# Patient Record
Sex: Male | Born: 1960 | Race: Black or African American | Hispanic: No | Marital: Single | Smoking: Current every day smoker
Health system: Southern US, Community
[De-identification: ages and names within clinical notes are randomized; demographics above are authoritative.]

## PROBLEM LIST (undated history)

## (undated) DIAGNOSIS — I10 Essential (primary) hypertension: Secondary | ICD-10-CM

## (undated) DIAGNOSIS — E119 Type 2 diabetes mellitus without complications: Secondary | ICD-10-CM

## (undated) DIAGNOSIS — Z9119 Patient's noncompliance with other medical treatment and regimen: Secondary | ICD-10-CM

## (undated) DIAGNOSIS — E785 Hyperlipidemia, unspecified: Secondary | ICD-10-CM

## (undated) DIAGNOSIS — J449 Chronic obstructive pulmonary disease, unspecified: Secondary | ICD-10-CM

## (undated) DIAGNOSIS — Z91199 Patient's noncompliance with other medical treatment and regimen due to unspecified reason: Secondary | ICD-10-CM

## (undated) DIAGNOSIS — G4733 Obstructive sleep apnea (adult) (pediatric): Secondary | ICD-10-CM

## (undated) DIAGNOSIS — I428 Other cardiomyopathies: Secondary | ICD-10-CM

## (undated) DIAGNOSIS — I5042 Chronic combined systolic (congestive) and diastolic (congestive) heart failure: Secondary | ICD-10-CM

## (undated) DIAGNOSIS — F1910 Other psychoactive substance abuse, uncomplicated: Secondary | ICD-10-CM

## (undated) HISTORY — PX: OTHER SURGICAL HISTORY: SHX169

## (undated) HISTORY — DX: Obstructive sleep apnea (adult) (pediatric): G47.33

## (undated) HISTORY — DX: Chronic combined systolic (congestive) and diastolic (congestive) heart failure: I50.42

---

## 1998-05-29 ENCOUNTER — Emergency Department (HOSPITAL_COMMUNITY): Admission: EM | Admit: 1998-05-29 | Discharge: 1998-05-29 | Payer: Self-pay

## 1998-10-09 ENCOUNTER — Encounter: Payer: Self-pay | Admitting: Family Medicine

## 1998-10-09 ENCOUNTER — Ambulatory Visit (HOSPITAL_COMMUNITY): Admission: RE | Admit: 1998-10-09 | Discharge: 1998-10-09 | Payer: Self-pay | Admitting: Family Medicine

## 1998-10-17 ENCOUNTER — Encounter: Payer: Self-pay | Admitting: Family Medicine

## 1998-10-17 ENCOUNTER — Ambulatory Visit (HOSPITAL_COMMUNITY): Admission: RE | Admit: 1998-10-17 | Discharge: 1998-10-17 | Payer: Self-pay | Admitting: Family Medicine

## 1999-12-24 ENCOUNTER — Ambulatory Visit (HOSPITAL_COMMUNITY): Admission: RE | Admit: 1999-12-24 | Discharge: 1999-12-24 | Payer: Self-pay | Admitting: Internal Medicine

## 2000-09-12 ENCOUNTER — Ambulatory Visit (HOSPITAL_BASED_OUTPATIENT_CLINIC_OR_DEPARTMENT_OTHER): Admission: RE | Admit: 2000-09-12 | Discharge: 2000-09-12 | Payer: Self-pay | Admitting: Internal Medicine

## 2000-09-30 ENCOUNTER — Ambulatory Visit (HOSPITAL_BASED_OUTPATIENT_CLINIC_OR_DEPARTMENT_OTHER): Admission: RE | Admit: 2000-09-30 | Discharge: 2000-09-30 | Payer: Self-pay | Admitting: Internal Medicine

## 2000-10-30 ENCOUNTER — Ambulatory Visit (HOSPITAL_BASED_OUTPATIENT_CLINIC_OR_DEPARTMENT_OTHER): Admission: RE | Admit: 2000-10-30 | Discharge: 2000-10-30 | Payer: Self-pay | Admitting: Internal Medicine

## 2004-04-15 ENCOUNTER — Emergency Department (HOSPITAL_COMMUNITY): Admission: EM | Admit: 2004-04-15 | Discharge: 2004-04-15 | Payer: Self-pay | Admitting: Emergency Medicine

## 2004-04-17 ENCOUNTER — Ambulatory Visit: Payer: Self-pay | Admitting: Family Medicine

## 2004-04-17 ENCOUNTER — Ambulatory Visit: Payer: Self-pay | Admitting: *Deleted

## 2004-05-21 ENCOUNTER — Ambulatory Visit: Payer: Self-pay | Admitting: Family Medicine

## 2004-08-14 ENCOUNTER — Ambulatory Visit: Payer: Self-pay | Admitting: Family Medicine

## 2005-01-02 ENCOUNTER — Ambulatory Visit: Payer: Self-pay | Admitting: Family Medicine

## 2005-01-15 ENCOUNTER — Ambulatory Visit: Payer: Self-pay | Admitting: Family Medicine

## 2005-01-29 ENCOUNTER — Ambulatory Visit: Payer: Self-pay | Admitting: Family Medicine

## 2005-03-06 ENCOUNTER — Ambulatory Visit: Payer: Self-pay | Admitting: Family Medicine

## 2005-06-25 ENCOUNTER — Emergency Department (HOSPITAL_COMMUNITY): Admission: EM | Admit: 2005-06-25 | Discharge: 2005-06-25 | Payer: Self-pay | Admitting: Emergency Medicine

## 2005-07-29 ENCOUNTER — Ambulatory Visit: Payer: Self-pay | Admitting: Internal Medicine

## 2005-08-07 ENCOUNTER — Ambulatory Visit: Payer: Self-pay | Admitting: Family Medicine

## 2005-10-29 ENCOUNTER — Ambulatory Visit: Payer: Self-pay | Admitting: Family Medicine

## 2006-03-13 ENCOUNTER — Ambulatory Visit: Payer: Self-pay | Admitting: Family Medicine

## 2006-05-23 ENCOUNTER — Ambulatory Visit: Payer: Self-pay | Admitting: Nurse Practitioner

## 2006-05-23 ENCOUNTER — Ambulatory Visit (HOSPITAL_COMMUNITY): Admission: RE | Admit: 2006-05-23 | Discharge: 2006-05-23 | Payer: Self-pay | Admitting: Nurse Practitioner

## 2006-06-04 ENCOUNTER — Ambulatory Visit: Payer: Self-pay | Admitting: Family Medicine

## 2006-07-10 ENCOUNTER — Ambulatory Visit: Payer: Self-pay | Admitting: Family Medicine

## 2006-07-16 ENCOUNTER — Ambulatory Visit: Payer: Self-pay | Admitting: Cardiology

## 2006-07-16 ENCOUNTER — Encounter: Payer: Self-pay | Admitting: Cardiology

## 2006-07-16 ENCOUNTER — Ambulatory Visit (HOSPITAL_COMMUNITY): Admission: RE | Admit: 2006-07-16 | Discharge: 2006-07-16 | Payer: Self-pay | Admitting: Internal Medicine

## 2006-07-23 ENCOUNTER — Ambulatory Visit: Payer: Self-pay | Admitting: Family Medicine

## 2006-09-05 ENCOUNTER — Encounter (HOSPITAL_COMMUNITY): Admission: RE | Admit: 2006-09-05 | Discharge: 2006-11-25 | Payer: Self-pay | Admitting: Interventional Cardiology

## 2007-04-29 ENCOUNTER — Encounter (INDEPENDENT_AMBULATORY_CARE_PROVIDER_SITE_OTHER): Payer: Self-pay | Admitting: *Deleted

## 2008-06-29 ENCOUNTER — Emergency Department (HOSPITAL_COMMUNITY): Admission: EM | Admit: 2008-06-29 | Discharge: 2008-06-29 | Payer: Self-pay | Admitting: Emergency Medicine

## 2010-05-21 ENCOUNTER — Ambulatory Visit: Payer: Self-pay | Admitting: Cardiology

## 2010-05-21 ENCOUNTER — Encounter (INDEPENDENT_AMBULATORY_CARE_PROVIDER_SITE_OTHER): Payer: Self-pay | Admitting: Internal Medicine

## 2010-05-21 ENCOUNTER — Inpatient Hospital Stay (HOSPITAL_COMMUNITY): Admission: EM | Admit: 2010-05-21 | Discharge: 2010-05-24 | Payer: Self-pay | Admitting: Emergency Medicine

## 2010-06-11 ENCOUNTER — Encounter
Admission: RE | Admit: 2010-06-11 | Discharge: 2010-06-11 | Payer: Self-pay | Source: Home / Self Care | Attending: Internal Medicine | Admitting: Internal Medicine

## 2010-10-25 LAB — POCT I-STAT, CHEM 8
HCT: 55 % — ABNORMAL HIGH (ref 39.0–52.0)
Hemoglobin: 18.7 g/dL — ABNORMAL HIGH (ref 13.0–17.0)
Potassium: 3.7 mEq/L (ref 3.5–5.1)
Sodium: 145 mEq/L (ref 135–145)

## 2010-10-25 LAB — CBC
HCT: 50.5 % (ref 39.0–52.0)
Hemoglobin: 17.2 g/dL — ABNORMAL HIGH (ref 13.0–17.0)
MCH: 30.2 pg (ref 26.0–34.0)
MCHC: 33.9 g/dL (ref 30.0–36.0)
MCHC: 34.1 g/dL (ref 30.0–36.0)
Platelets: 186 10*3/uL (ref 150–400)
RBC: 5.07 MIL/uL (ref 4.22–5.81)
RDW: 13.3 % (ref 11.5–15.5)

## 2010-10-25 LAB — GLUCOSE, CAPILLARY
Glucose-Capillary: 146 mg/dL — ABNORMAL HIGH (ref 70–99)
Glucose-Capillary: 152 mg/dL — ABNORMAL HIGH (ref 70–99)
Glucose-Capillary: 164 mg/dL — ABNORMAL HIGH (ref 70–99)
Glucose-Capillary: 190 mg/dL — ABNORMAL HIGH (ref 70–99)
Glucose-Capillary: 213 mg/dL — ABNORMAL HIGH (ref 70–99)

## 2010-10-25 LAB — BRAIN NATRIURETIC PEPTIDE: Pro B Natriuretic peptide (BNP): 552 pg/mL — ABNORMAL HIGH (ref 0.0–100.0)

## 2010-10-25 LAB — BASIC METABOLIC PANEL
BUN: 13 mg/dL (ref 6–23)
BUN: 13 mg/dL (ref 6–23)
CO2: 30 mEq/L (ref 19–32)
Calcium: 8.7 mg/dL (ref 8.4–10.5)
Calcium: 8.8 mg/dL (ref 8.4–10.5)
Calcium: 8.8 mg/dL (ref 8.4–10.5)
Creatinine, Ser: 0.9 mg/dL (ref 0.4–1.5)
Creatinine, Ser: 0.91 mg/dL (ref 0.4–1.5)
Creatinine, Ser: 0.99 mg/dL (ref 0.4–1.5)
GFR calc Af Amer: >60 mL/min (ref >60)
GFR calc Af Amer: >60 mL/min (ref >60)
GFR calc non Af Amer: >60 mL/min (ref >60)

## 2010-10-25 LAB — LIPID PANEL
Cholesterol: 173 mg/dL (ref 0–200)
HDL: 42 mg/dL (ref >39)
Total CHOL/HDL Ratio: 4.1 RATIO

## 2010-10-25 LAB — DIFFERENTIAL
Basophils Absolute: 0 10*3/uL (ref 0.0–0.1)
Basophils Relative: 0 % (ref 0–1)
Eosinophils Absolute: 0.2 10*3/uL (ref 0.0–0.7)
Neutrophils Relative %: 55 % (ref 43–77)

## 2010-10-25 LAB — COMPREHENSIVE METABOLIC PANEL
ALT: 41 U/L (ref 0–53)
Alkaline Phosphatase: 94 U/L (ref 39–117)
CO2: 27 mEq/L (ref 19–32)
Calcium: 9 mg/dL (ref 8.4–10.5)
GFR calc non Af Amer: >60 mL/min (ref >60)
Glucose, Bld: 201 mg/dL — ABNORMAL HIGH (ref 70–99)
Potassium: 3.8 mEq/L (ref 3.5–5.1)
Sodium: 144 mEq/L (ref 135–145)

## 2010-10-25 LAB — CARDIAC PANEL(CRET KIN+CKTOT+MB+TROPI)
CK, MB: 2.7 ng/mL (ref 0.3–4.0)
CK, MB: 3 ng/mL (ref 0.3–4.0)
Relative Index: 2.4 (ref 0.0–2.5)
Total CK: 111 U/L (ref 7–232)

## 2010-10-25 LAB — URINALYSIS, ROUTINE W REFLEX MICROSCOPIC
Bilirubin Urine: NEGATIVE
Ketones, ur: NEGATIVE mg/dL
Nitrite: NEGATIVE
Protein, ur: >300 mg/dL — AB
pH: 5.5 (ref 5.0–8.0)

## 2010-10-25 LAB — POCT CARDIAC MARKERS
CKMB, poc: 1.6 ng/mL (ref 1.0–8.0)
Myoglobin, poc: 45.6 ng/mL (ref 12–200)

## 2010-10-25 LAB — CK TOTAL AND CKMB (NOT AT ARMC): Relative Index: 2.2 (ref 0.0–2.5)

## 2010-10-25 LAB — TROPONIN I: Troponin I: 0.02 ng/mL (ref 0.00–0.06)

## 2010-10-25 LAB — MAGNESIUM: Magnesium: 2.3 mg/dL (ref 1.5–2.5)

## 2011-05-14 LAB — POCT I-STAT, CHEM 8
Calcium, Ion: 1.17
Creatinine, Ser: 0.9
Glucose, Bld: 226 — ABNORMAL HIGH
HCT: 46
Hemoglobin: 15.6
Potassium: 4.3
TCO2: 26

## 2011-05-14 LAB — CBC
Hemoglobin: 14.8
MCHC: 33.1
MCV: 90.9
RDW: 13.6

## 2011-05-14 LAB — URINALYSIS, ROUTINE W REFLEX MICROSCOPIC
Bilirubin Urine: NEGATIVE
Glucose, UA: >1000 — AB
Leukocytes, UA: NEGATIVE
Nitrite: NEGATIVE
Protein, ur: 30 — AB

## 2011-05-14 LAB — DIFFERENTIAL
Basophils Absolute: 0
Basophils Relative: 0
Eosinophils Absolute: 0
Monocytes Absolute: 0.3
Neutro Abs: 3.7

## 2011-11-07 ENCOUNTER — Emergency Department (HOSPITAL_COMMUNITY): Payer: Self-pay

## 2011-11-07 ENCOUNTER — Other Ambulatory Visit: Payer: Self-pay

## 2011-11-07 ENCOUNTER — Inpatient Hospital Stay (HOSPITAL_COMMUNITY)
Admission: EM | Admit: 2011-11-07 | Discharge: 2011-11-09 | DRG: 189 | Disposition: A | Discharge status: Home / Self Care | Payer: 59 | Attending: Internal Medicine | Admitting: Internal Medicine

## 2011-11-07 ENCOUNTER — Encounter (HOSPITAL_COMMUNITY): Payer: Self-pay

## 2011-11-07 DIAGNOSIS — I428 Other cardiomyopathies: Secondary | ICD-10-CM | POA: Diagnosis present

## 2011-11-07 DIAGNOSIS — J9600 Acute respiratory failure, unspecified whether with hypoxia or hypercapnia: Principal | ICD-10-CM | POA: Diagnosis present

## 2011-11-07 DIAGNOSIS — R51 Headache: Secondary | ICD-10-CM | POA: Diagnosis present

## 2011-11-07 DIAGNOSIS — E785 Hyperlipidemia, unspecified: Secondary | ICD-10-CM | POA: Diagnosis present

## 2011-11-07 DIAGNOSIS — Z9119 Patient's noncompliance with other medical treatment and regimen: Secondary | ICD-10-CM

## 2011-11-07 DIAGNOSIS — J4489 Other specified chronic obstructive pulmonary disease: Secondary | ICD-10-CM | POA: Diagnosis present

## 2011-11-07 DIAGNOSIS — J810 Acute pulmonary edema: Secondary | ICD-10-CM

## 2011-11-07 DIAGNOSIS — I10 Essential (primary) hypertension: Secondary | ICD-10-CM | POA: Diagnosis present

## 2011-11-07 DIAGNOSIS — I509 Heart failure, unspecified: Secondary | ICD-10-CM | POA: Diagnosis present

## 2011-11-07 DIAGNOSIS — Z91199 Patient's noncompliance with other medical treatment and regimen due to unspecified reason: Secondary | ICD-10-CM

## 2011-11-07 DIAGNOSIS — J449 Chronic obstructive pulmonary disease, unspecified: Secondary | ICD-10-CM | POA: Diagnosis present

## 2011-11-07 DIAGNOSIS — J811 Chronic pulmonary edema: Secondary | ICD-10-CM

## 2011-11-07 DIAGNOSIS — F17200 Nicotine dependence, unspecified, uncomplicated: Secondary | ICD-10-CM | POA: Diagnosis present

## 2011-11-07 DIAGNOSIS — E119 Type 2 diabetes mellitus without complications: Secondary | ICD-10-CM | POA: Diagnosis present

## 2011-11-07 DIAGNOSIS — I5043 Acute on chronic combined systolic (congestive) and diastolic (congestive) heart failure: Secondary | ICD-10-CM | POA: Diagnosis present

## 2011-11-07 DIAGNOSIS — I169 Hypertensive crisis, unspecified: Secondary | ICD-10-CM | POA: Diagnosis present

## 2011-11-07 DIAGNOSIS — E876 Hypokalemia: Secondary | ICD-10-CM | POA: Diagnosis present

## 2011-11-07 DIAGNOSIS — G4733 Obstructive sleep apnea (adult) (pediatric): Secondary | ICD-10-CM | POA: Diagnosis present

## 2011-11-07 HISTORY — DX: Other cardiomyopathies: I42.8

## 2011-11-07 HISTORY — DX: Other psychoactive substance abuse, uncomplicated: F19.10

## 2011-11-07 HISTORY — DX: Essential (primary) hypertension: I10

## 2011-11-07 HISTORY — DX: Chronic obstructive pulmonary disease, unspecified: J44.9

## 2011-11-07 HISTORY — DX: Patient's noncompliance with other medical treatment and regimen due to unspecified reason: Z91.199

## 2011-11-07 HISTORY — DX: Patient's noncompliance with other medical treatment and regimen: Z91.19

## 2011-11-07 HISTORY — DX: Hyperlipidemia, unspecified: E78.5

## 2011-11-07 LAB — BASIC METABOLIC PANEL
CO2: 30 mEq/L (ref 19–32)
Calcium: 9.2 mg/dL (ref 8.4–10.5)
GFR calc Af Amer: >90 mL/min (ref >90)
GFR calc non Af Amer: >90 mL/min (ref >90)
Sodium: 139 mEq/L (ref 135–145)

## 2011-11-07 LAB — POCT I-STAT TROPONIN I: Troponin i, poc: 0.04 ng/mL (ref 0.00–0.08)

## 2011-11-07 LAB — GLUCOSE, CAPILLARY
Glucose-Capillary: 164 mg/dL — ABNORMAL HIGH (ref 70–99)
Glucose-Capillary: 84 mg/dL (ref 70–99)

## 2011-11-07 LAB — URINALYSIS, ROUTINE W REFLEX MICROSCOPIC
Glucose, UA: >1000 mg/dL — AB
Ketones, ur: NEGATIVE mg/dL
Ketones, ur: NEGATIVE mg/dL
Leukocytes, UA: NEGATIVE
Leukocytes, UA: NEGATIVE
Nitrite: NEGATIVE
Nitrite: NEGATIVE
Protein, ur: >300 mg/dL — AB
Protein, ur: NEGATIVE mg/dL
Urobilinogen, UA: 1 mg/dL (ref 0.0–1.0)
Urobilinogen, UA: 0.2 mg/dL (ref 0.0–1.0)

## 2011-11-07 LAB — RAPID URINE DRUG SCREEN, HOSP PERFORMED
Barbiturates: NOT DETECTED
Cocaine: NOT DETECTED

## 2011-11-07 LAB — PHOSPHORUS: Phosphorus: 5.1 mg/dL — ABNORMAL HIGH (ref 2.3–4.6)

## 2011-11-07 LAB — BLOOD GAS, ARTERIAL
Acid-base deficit: 1.9 mmol/L (ref 0.0–2.0)
Bicarbonate: 22.4 mEq/L (ref 20.0–24.0)
Drawn by: 295031
Inspiratory PAP: 14
Mode: POSITIVE
TCO2: 21.1 mmol/L (ref 0–100)
TCO2: 19.5 mmol/L (ref 0–100)
pCO2 arterial: 59.9 mmHg (ref 35.0–45.0)
pCO2 arterial: 39.1 mmHg (ref 35.0–45.0)
pH, Arterial: 7.215 — ABNORMAL LOW (ref 7.350–7.450)
pO2, Arterial: 324 mmHg — ABNORMAL HIGH (ref 80.0–100.0)
pO2, Arterial: 69.5 mmHg — ABNORMAL LOW (ref 80.0–100.0)

## 2011-11-07 LAB — APTT: aPTT: 27 seconds (ref 24–37)

## 2011-11-07 LAB — DIFFERENTIAL
Basophils Absolute: 0 10*3/uL (ref 0.0–0.1)
Eosinophils Absolute: 0.1 10*3/uL (ref 0.0–0.7)
Lymphocytes Relative: 31 % (ref 12–46)
Monocytes Absolute: 0.6 10*3/uL (ref 0.1–1.0)
Neutrophils Relative %: 61 % (ref 43–77)

## 2011-11-07 LAB — PROTIME-INR
INR: 1 (ref 0.00–1.49)
Prothrombin Time: 13.4 seconds (ref 11.6–15.2)

## 2011-11-07 LAB — PROCALCITONIN: Procalcitonin: 1.56 ng/mL

## 2011-11-07 LAB — POCT I-STAT, CHEM 8
Chloride: 105 mEq/L (ref 96–112)
HCT: 50 % (ref 39.0–52.0)
Hemoglobin: 17 g/dL (ref 13.0–17.0)
Potassium: 4.2 mEq/L (ref 3.5–5.1)
Sodium: 138 mEq/L (ref 135–145)

## 2011-11-07 LAB — CBC
MCH: 29.8 pg (ref 26.0–34.0)
MCHC: 33.3 g/dL (ref 30.0–36.0)
Platelets: 250 10*3/uL (ref 150–400)
RDW: 13.3 % (ref 11.5–15.5)

## 2011-11-07 LAB — URINE MICROSCOPIC-ADD ON

## 2011-11-07 LAB — MAGNESIUM: Magnesium: 1.7 mg/dL (ref 1.5–2.5)

## 2011-11-07 LAB — CORTISOL: Cortisol, Plasma: 53.2 ug/dL

## 2011-11-07 LAB — CARDIAC PANEL(CRET KIN+CKTOT+MB+TROPI)
CK, MB: 4.9 ng/mL — ABNORMAL HIGH (ref 0.3–4.0)
Troponin I: 1.22 ng/mL (ref <0.30)

## 2011-11-07 LAB — PRO B NATRIURETIC PEPTIDE: Pro B Natriuretic peptide (BNP): 1180 pg/mL — ABNORMAL HIGH (ref 0–125)

## 2011-11-07 MED ORDER — HYDRALAZINE HCL 20 MG/ML IJ SOLN
10.0000 mg | Freq: Once | INTRAMUSCULAR | Status: AC
  Administered 2011-11-07: 10 mg via INTRAVENOUS

## 2011-11-07 MED ORDER — POTASSIUM CHLORIDE CRYS ER 20 MEQ PO TBCR
40.0000 meq | EXTENDED_RELEASE_TABLET | Freq: Once | ORAL | Status: AC
  Administered 2011-11-07: 40 meq via ORAL

## 2011-11-07 MED ORDER — ETOMIDATE 2 MG/ML IV SOLN
INTRAVENOUS | Status: AC
  Filled 2011-11-07: qty 20

## 2011-11-07 MED ORDER — INSULIN ASPART 100 UNIT/ML ~~LOC~~ SOLN
0.0000 [IU] | Freq: Three times a day (TID) | SUBCUTANEOUS | Status: DC
  Administered 2011-11-07: 20 [IU] via SUBCUTANEOUS

## 2011-11-07 MED ORDER — IPRATROPIUM BROMIDE 0.02 % IN SOLN: 0.5000 mg | Freq: Four times a day (QID) | RESPIRATORY_TRACT | Status: DC | PRN

## 2011-11-07 MED ORDER — SODIUM CHLORIDE 0.9 % IV SOLN: INTRAVENOUS | Status: DC

## 2011-11-07 MED ORDER — PANTOPRAZOLE SODIUM 40 MG IV SOLR
40.0000 mg | Freq: Every day | INTRAVENOUS | Status: DC
  Administered 2011-11-07: 40 mg via INTRAVENOUS
  Filled 2011-11-07 (×2): qty 40

## 2011-11-07 MED ORDER — FUROSEMIDE 10 MG/ML IJ SOLN: 40.0000 mg | Freq: Once | INTRAMUSCULAR | Status: DC

## 2011-11-07 MED ORDER — SUCCINYLCHOLINE CHLORIDE 20 MG/ML IJ SOLN
INTRAMUSCULAR | Status: AC
  Filled 2011-11-07: qty 5

## 2011-11-07 MED ORDER — ALBUTEROL SULFATE (5 MG/ML) 0.5% IN NEBU
5.0000 mg | INHALATION_SOLUTION | Freq: Once | RESPIRATORY_TRACT | Status: AC
  Administered 2011-11-07: 5 mg via RESPIRATORY_TRACT
  Filled 2011-11-07: qty 1

## 2011-11-07 MED ORDER — HYDRALAZINE HCL 20 MG/ML IJ SOLN
20.0000 mg | INTRAMUSCULAR | Status: DC | PRN
  Filled 2011-11-07: qty 1

## 2011-11-07 MED ORDER — NITROGLYCERIN IN D5W 200-5 MCG/ML-% IV SOLN: 5.0000 ug/min | Freq: Once | INTRAVENOUS | Status: DC

## 2011-11-07 MED ORDER — LIDOCAINE HCL (CARDIAC) 20 MG/ML IV SOLN
INTRAVENOUS | Status: AC
  Filled 2011-11-07: qty 5

## 2011-11-07 MED ORDER — ASPIRIN 325 MG PO TABS
325.0000 mg | ORAL_TABLET | Freq: Once | ORAL | Status: DC
  Filled 2011-11-07: qty 1

## 2011-11-07 MED ORDER — ASPIRIN 81 MG PO CHEW
CHEWABLE_TABLET | ORAL | Status: AC
  Administered 2011-11-07: 324 mg
  Filled 2011-11-07: qty 4

## 2011-11-07 MED ORDER — FUROSEMIDE 10 MG/ML IJ SOLN
40.0000 mg | Freq: Once | INTRAMUSCULAR | Status: AC
  Administered 2011-11-07: 40 mg via INTRAVENOUS

## 2011-11-07 MED ORDER — ALBUTEROL SULFATE (5 MG/ML) 0.5% IN NEBU: 2.5000 mg | INHALATION_SOLUTION | Freq: Four times a day (QID) | RESPIRATORY_TRACT | Status: DC | PRN

## 2011-11-07 MED ORDER — NITROGLYCERIN IN D5W 200-5 MCG/ML-% IV SOLN
INTRAVENOUS | Status: AC
  Filled 2011-11-07: qty 250

## 2011-11-07 MED ORDER — POTASSIUM CHLORIDE CRYS ER 20 MEQ PO TBCR
EXTENDED_RELEASE_TABLET | ORAL | Status: AC
  Filled 2011-11-07: qty 2

## 2011-11-07 MED ORDER — VANCOMYCIN HCL IN DEXTROSE 1-5 GM/200ML-% IV SOLN
1000.0000 mg | INTRAVENOUS | Status: AC
  Administered 2011-11-07: 1000 mg via INTRAVENOUS
  Filled 2011-11-07: qty 200

## 2011-11-07 MED ORDER — FUROSEMIDE 10 MG/ML IJ SOLN
40.0000 mg | Freq: Once | INTRAMUSCULAR | Status: AC
  Administered 2011-11-07: 40 mg via INTRAVENOUS
  Filled 2011-11-07: qty 4

## 2011-11-07 MED ORDER — LABETALOL HCL 5 MG/ML IV SOLN
20.0000 mg | Freq: Once | INTRAVENOUS | Status: AC
  Administered 2011-11-07: 20 mg via INTRAVENOUS

## 2011-11-07 MED ORDER — MAGNESIUM SULFATE 40 MG/ML IJ SOLN
2.0000 g | Freq: Once | INTRAMUSCULAR | Status: AC
  Administered 2011-11-07: 2 g via INTRAVENOUS
  Filled 2011-11-07: qty 50

## 2011-11-07 MED ORDER — INSULIN ASPART 100 UNIT/ML ~~LOC~~ SOLN
0.0000 [IU] | SUBCUTANEOUS | Status: DC
  Administered 2011-11-07: 15 [IU] via SUBCUTANEOUS
  Administered 2011-11-07: 4 [IU] via SUBCUTANEOUS
  Administered 2011-11-08: 11 [IU] via SUBCUTANEOUS
  Administered 2011-11-08 (×2): 4 [IU] via SUBCUTANEOUS

## 2011-11-07 MED ORDER — FUROSEMIDE 10 MG/ML IJ SOLN
60.0000 mg | Freq: Two times a day (BID) | INTRAMUSCULAR | Status: DC
  Filled 2011-11-07 (×2): qty 6

## 2011-11-07 MED ORDER — LABETALOL HCL 5 MG/ML IV SOLN
0.5000 mg/min | INTRAVENOUS | Status: DC
  Filled 2011-11-07: qty 100

## 2011-11-07 MED ORDER — METOPROLOL TARTRATE 25 MG/10 ML ORAL SUSPENSION
25.0000 mg | Freq: Two times a day (BID) | ORAL | Status: DC
  Administered 2011-11-07 – 2011-11-08 (×2): 25 mg via ORAL
  Filled 2011-11-07 (×4): qty 10

## 2011-11-07 MED ORDER — ROCURONIUM BROMIDE 50 MG/5ML IV SOLN
INTRAVENOUS | Status: AC
  Filled 2011-11-07: qty 2

## 2011-11-07 MED ORDER — FUROSEMIDE 10 MG/ML IJ SOLN
60.0000 mg | Freq: Two times a day (BID) | INTRAMUSCULAR | Status: DC
  Administered 2011-11-07: 60 mg via INTRAVENOUS
  Filled 2011-11-07 (×3): qty 6

## 2011-11-07 MED ORDER — PIPERACILLIN-TAZOBACTAM 3.375 G IVPB 30 MIN
3.3750 g | INTRAVENOUS | Status: AC
  Administered 2011-11-07: 3.375 g via INTRAVENOUS
  Filled 2011-11-07: qty 50

## 2011-11-07 NOTE — H&P (Signed)
Name: David Bright MRN: [id number] DOB: 1961-05-01    LOS: 0 Requesting MD: Regarding:  ADMIT/ CONSULT NOTE  History of Present Illness: 51 yo AAM, smoker,non-compliant with antihypertensive medication times 9 months who presents with increased sob. He has had similar episodes the was last 1 year ago. He presented with frothy pink sputum, sbp>200, coarse wet breath sounds. He was close to intubation when lasix and NIMVS turned him around. He is comfortable on NIMVS and has responded to lasix with 1500cc of urine. He does have OSA and has old cpap machine but no primary care MD, cannot afford meds. Due to his HTN crisis , hypercarbic resp distress PCCM has been asked to admit. Antibiotics will be discontinued.  Lines / Drains: none  Cultures: none  Antibiotics: 3/28 vanc>>3/28 3/28 zoysn>>3/28  Tests / Events: 3/28 2decho>>  Subjective: No acute distress on bipap Past Medical History  Diagnosis Date  . CHF (congestive heart failure)   . COPD (chronic obstructive pulmonary disease)   . Diabetes mellitus   . Hypertension   . Sleep apnea     Nocturnal CPAP  . Hyperlipidemia   . Non compliance with medical treatment   . Polysubstance abuse   . Non-ischemic cardiomyopathy     Normal cath 2002   Past Surgical History  Procedure Date  . None    Prior to Admission medications   Not on File   Allergies No Known Allergies  Family History Family History  Problem Relation Age of Onset  . Cancer Mother     Social History Smokes cigars daily. Denies ETOH or drugs  Review Of Systems  11 points review of systems is negative with an exception of listed in HPI. Constitutional: negative for anorexia, fevers and sweats  Eyes: negative for irritation, redness and visual disturbance  Ears, nose, mouth, throat, and face:  negative for earaches, epistaxis, nasal congestion and sore throat  Respiratory:c/o cough, dyspnea on exertion, sputum and wheezing  Cardiovascular: negative for chest pain, dyspnea, lower extremity edema, orthopnea, palpitations and syncope  Gastrointestinal: negative for abdominal pain, constipation, diarrhea, melena, nausea and vomiting  Genitourinary:negative for dysuria, frequency and hematuria  Hematologic/lymphatic: negative for bleeding, easy bruising and lymphadenopathy  Musculoskeletal:negative for arthralgias, muscle weakness and stiff joints  Neurological: negative for coordination problems, gait problems, headaches and weakness  Endocrine: negative for diabetic symptoms including polydipsia, polyuria and weight loss  Vital Signs: Temp:  [98.1 F (36.7 C)-101.3 F (38.5 C)] 98.1 F (36.7 C) (03/28 0845) Pulse Rate:  [92-135] 93  (03/28 0845) Resp:  [22-42] 33  (03/28 0845) BP: (104-203)/(55-125) 122/64 mmHg (03/28 0830) SpO2:  [86 %-100 %] 93 % (03/28 0845) FiO2 (%):  [50 %] 50 % (03/28 0826) Weight:  [298 lb (135.172 kg)] 298 lb (135.172 kg) (03/28 0538)    Physical Examination: General: MO AAM on bipap Neuro:  intact   HEENT:  No jvd Cardiovascular:  hsr rrr Lungs:  Coarse rhonchi rales  Abdomen:  obese Musculoskeletal:  2+ le edema Skin:  intact  Ventilator settings: Vent Mode:  [-]  FiO2 (%):  [50 %] 50 %  Labs and Imaging:  Dg Chest Portable 1 View  11/07/2011  *RADIOLOGY REPORT*  Clinical Data: Shortness of breath.  PORTABLE CHEST - 1 VIEW  Comparison: Chest radiograph performed 05/21/2010  Findings: Vascular congestion is noted, with bilateral central airspace opacities, concerning for recurrent pulmonary edema.  No definite pleural effusion is identified, though the left costophrenic angle is incompletely imaged.  No pneumothorax is seen.  The cardiomediastinal silhouette remains significantly enlarged; underlying pericardial effusion cannot be excluded,  though cardiomegaly appears grossly stable from the prior study.  No acute osseous abnormalities are identified.  IMPRESSION: Vascular congestion and cardiomegaly, with bilateral central airspace opacities, concerning for recurrent pulmonary edema. Underlying pericardial effusion cannot be excluded, though cardiomegaly appears grossly stable from 2011.  Original Report Authenticated By: Tonia Ghent, M.D.    Lab 11/07/11 0559  NA 138  K 4.2  CL 105  CO2 --  BUN 17  CREATININE 0.70  GLUCOSE 358*    Lab 11/07/11 0559 11/07/11 0545  HGB 17.0 15.6  HCT 50.0 46.9  WBC -- 9.1  PLT -- 250   ABG    Component Value Date/Time   PHART 7.215* 11/07/2011 0636   PCO2ART 59.9* 11/07/2011 0636   PO2ART 324.0* 11/07/2011 0636   HCO3 23.4 11/07/2011 0636   TCO2 21.1 11/07/2011 0636   ACIDBASEDEF 5.4* 11/07/2011 0636   O2SAT 99.1 11/07/2011 0636    Assessment and Plan: Acute resp failure most likely secondary to hypertensive crisis due to medical non compliance, hypercarbia, chf. -admit to ICU -change to SDU in 12h -NIMVS as needed, venti mask -aggressive diuresis -Institute antihypertensive  -2 d -cards consult -check cardiac enzymes  -urine drug screen -rechecked abg on nimvs >> ok CHF -see above OSA -cpap HTN -see above Non compliance -needs PCP ? 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 FCCP. [place] [facility] Pager [phone] If no response call [phone]

## 2011-11-07 NOTE — ED Notes (Signed)
Bipap completed by Philis Fendt, RRT. Pt. Documented in error under Allred,C.

## 2011-11-07 NOTE — ED Notes (Signed)
Pt states increased SOB with production of "pink frothy" sputum-initial O2 sat on arrival was 86%-taken immediately to Room 9 to be triaged-pt only to speak full sentences due to SOB-pt states he smokes and also uses c-pap

## 2011-11-07 NOTE — ED Notes (Signed)
Pt states he has not been taking his meds for the past 6 months-was on Coreg and Lasix,med for diabetes

## 2011-11-07 NOTE — ED Provider Notes (Signed)
History     CSN: [id number]  Arrival date & time 11/07/11  4098   First MD Initiated Contact with Patient 11/07/11 [phone]      Chief Complaint  Patient presents with  . Shortness of Breath    (Consider location/radiation/quality/duration/timing/severity/associated sxs/prior treatment) Patient is a 51 y.o. male presenting with shortness of breath. The history is provided by a relative. The history is limited by the condition of the patient (SOB urgent need for intervention). No language interpreter was used.  Shortness of Breath  Episode onset: several days ago. The onset was sudden. The problem occurs continuously. The problem has been rapidly worsening. The problem is severe. The symptoms are relieved by nothing. The symptoms are aggravated by nothing. Associated symptoms include cough and shortness of breath. Associated symptoms comments: Frothy pink sputum. The cough has no precipitants. Cough characteristics: frothy pink sputum. Nothing relieves the cough. Nothing worsens the cough. There was no intake of a foreign body. He has received no recent medical care.  Patient off fluid pills and coreg and all other meds reportedly x 9 months  Past Medical History  Diagnosis Date  . CHF (congestive heart failure)   . COPD (chronic obstructive pulmonary disease)   . Diabetes mellitus   . Hypertension   . Sleep apnea     Past Surgical History  Procedure Date  . None     History reviewed. No pertinent family history.  History  Substance Use Topics  . Smoking status: Current Everyday Smoker -- 0.5 packs/day    Types: Cigarettes  . Smokeless tobacco: Not on file  . Alcohol Use: No      Review of Systems  Unable to perform ROS Respiratory: Positive for cough and shortness of breath.     Allergies  Review of patient's allergies indicates no known allergies.  Home Medications  No current outpatient prescriptions on file.  BP 191/101  Pulse 124  Temp(Src) 98.5 F (36.9 C)  (Oral)  Resp 22  Ht 5\' 9"  (1.753 m)  Wt 298 lb (135.172 kg)  BMI 44.01 kg/m2  SpO2 93%  Physical Exam  Constitutional: He appears well-developed and well-nourished.  HENT:  Head: Normocephalic and atraumatic.  Eyes: Conjunctivae are normal. Pupils are equal, round, and reactive to light.  Neck: Normal range of motion. Neck supple.  Cardiovascular: Tachycardia present.   Pulmonary/Chest: No stridor. Tachypnea noted. He has rales.  Abdominal: Soft. Bowel sounds are normal. There is no tenderness.  Musculoskeletal: Normal range of motion.  Neurological: He is alert.  Skin: Skin is warm. He is diaphoretic.  Psychiatric: His behavior is normal.    ED Course  Procedures (including critical care time)   Labs Reviewed  CBC  DIFFERENTIAL  PRO B NATRIURETIC PEPTIDE   No results found.   No diagnosis found.   Date: 11/07/2011  Rate: 121  Rhythm: sinus tachycardia  QRS Axis: normal  Intervals: normal  ST/T Wave abnormalities: nonspecific ST changes  Conduction Disutrbances:none  Narrative Interpretation:   Old EKG Reviewed: changes noted   MDM Reviewed: nursing note and vitals Interpretation: labs, ECG and x-ray Total time providing critical care: > 105 minutes. This excludes time spent performing separately reportable procedures and services. Consults: admitting MD and pulmonary    MDM  CRITICAL CARE Performed by: Jasmine Awe   Total critical care time: 120 minutes  Critical care time was exclusive of separately billable procedures and treating other patients.  Critical care was necessary to treat or prevent imminent or  life-threatening deterioration.  Critical care was time spent personally by me on the following activities: development of treatment plan with patient and/or surrogate as well as nursing, discussions with consultants, evaluation of patient's response to treatment, examination of patient, obtaining history from patient or surrogate,  ordering and performing treatments and interventions, ordering and review of laboratory studies, ordering and review of radiographic studies, pulse oximetry and re-evaluation of patient's condition.        Jasmine Awe, MD 11/07/11 [phone]

## 2011-11-07 NOTE — ED Notes (Signed)
MD at bedside. 

## 2011-11-07 NOTE — ED Notes (Signed)
EKG given to EDP, Palumbo,MD. 

## 2011-11-07 NOTE — ED Notes (Signed)
Pt sitting up in the bed using four pillows

## 2011-11-07 NOTE — Plan of Care (Signed)
Problem: Phase I Progression Outcomes Goal: Voiding-avoid urinary catheter unless indicated Outcome: Not Applicable Date Met:  11/07/11 Foley in to measure strict i/o  (patient on lasix) Goal: Hemodynamically stable Outcome: Completed/Met Date Met:  11/07/11 BP stable, heart rate stable

## 2011-11-08 ENCOUNTER — Encounter (HOSPITAL_COMMUNITY): Payer: Self-pay | Admitting: Physician Assistant

## 2011-11-08 DIAGNOSIS — I10 Essential (primary) hypertension: Secondary | ICD-10-CM

## 2011-11-08 DIAGNOSIS — J810 Acute pulmonary edema: Secondary | ICD-10-CM

## 2011-11-08 DIAGNOSIS — J9600 Acute respiratory failure, unspecified whether with hypoxia or hypercapnia: Secondary | ICD-10-CM

## 2011-11-08 DIAGNOSIS — I5043 Acute on chronic combined systolic (congestive) and diastolic (congestive) heart failure: Secondary | ICD-10-CM

## 2011-11-08 LAB — BLOOD GAS, ARTERIAL
Drawn by: 232811
TCO2: 22.2 mmol/L (ref 0–100)
pCO2 arterial: 39.3 mmHg (ref 35.0–45.0)
pH, Arterial: 7.421 (ref 7.350–7.450)

## 2011-11-08 LAB — CBC
HCT: 43.6 % (ref 39.0–52.0)
Hemoglobin: 14.8 g/dL (ref 13.0–17.0)
RDW: 13.2 % (ref 11.5–15.5)
WBC: 9.5 10*3/uL (ref 4.0–10.5)

## 2011-11-08 LAB — CARDIAC PANEL(CRET KIN+CKTOT+MB+TROPI)
CK, MB: 5.6 ng/mL — ABNORMAL HIGH (ref 0.3–4.0)
Troponin I: 1.28 ng/mL (ref <0.30)

## 2011-11-08 LAB — BASIC METABOLIC PANEL
BUN: 15 mg/dL (ref 6–23)
Chloride: 99 mEq/L (ref 96–112)
GFR calc Af Amer: >90 mL/min (ref >90)
Potassium: 3.4 mEq/L — ABNORMAL LOW (ref 3.5–5.1)

## 2011-11-08 LAB — GLUCOSE, CAPILLARY

## 2011-11-08 MED ORDER — LISINOPRIL 5 MG PO TABS
5.0000 mg | ORAL_TABLET | Freq: Every day | ORAL | Status: DC
  Administered 2011-11-08 – 2011-11-09 (×2): 5 mg via ORAL
  Filled 2011-11-08 (×2): qty 1

## 2011-11-08 MED ORDER — METOPROLOL TARTRATE 25 MG PO TABS
25.0000 mg | ORAL_TABLET | Freq: Two times a day (BID) | ORAL | Status: DC
  Administered 2011-11-08: 25 mg via ORAL
  Filled 2011-11-08 (×3): qty 1

## 2011-11-08 MED ORDER — POTASSIUM CHLORIDE CRYS ER 20 MEQ PO TBCR
40.0000 meq | EXTENDED_RELEASE_TABLET | Freq: Once | ORAL | Status: AC
  Administered 2011-11-08: 40 meq via ORAL
  Filled 2011-11-08: qty 2

## 2011-11-08 MED ORDER — FUROSEMIDE 40 MG PO TABS
40.0000 mg | ORAL_TABLET | Freq: Every day | ORAL | Status: DC
  Administered 2011-11-08 – 2011-11-09 (×2): 40 mg via ORAL
  Filled 2011-11-08 (×2): qty 1

## 2011-11-08 MED ORDER — ALBUTEROL SULFATE (5 MG/ML) 0.5% IN NEBU: 2.5000 mg | INHALATION_SOLUTION | RESPIRATORY_TRACT | Status: DC | PRN

## 2011-11-08 MED ORDER — ENOXAPARIN SODIUM 40 MG/0.4ML ~~LOC~~ SOLN
40.0000 mg | SUBCUTANEOUS | Status: DC
  Administered 2011-11-08 – 2011-11-09 (×2): 40 mg via SUBCUTANEOUS
  Filled 2011-11-08 (×2): qty 0.4

## 2011-11-08 MED ORDER — IPRATROPIUM BROMIDE 0.02 % IN SOLN
0.5000 mg | RESPIRATORY_TRACT | Status: DC | PRN
  Filled 2011-11-08: qty 2.5

## 2011-11-08 MED ORDER — INSULIN ASPART 100 UNIT/ML ~~LOC~~ SOLN
0.0000 [IU] | Freq: Three times a day (TID) | SUBCUTANEOUS | Status: DC
  Administered 2011-11-09: 15 [IU] via SUBCUTANEOUS
  Administered 2011-11-09: 7 [IU] via SUBCUTANEOUS

## 2011-11-08 MED ORDER — INSULIN ASPART 100 UNIT/ML ~~LOC~~ SOLN
0.0000 [IU] | Freq: Every day | SUBCUTANEOUS | Status: DC
  Administered 2011-11-08: 3 [IU] via SUBCUTANEOUS

## 2011-11-08 MED ORDER — ATORVASTATIN CALCIUM 40 MG PO TABS
40.0000 mg | ORAL_TABLET | Freq: Every day | ORAL | Status: DC
  Administered 2011-11-08: 40 mg via ORAL
  Filled 2011-11-08 (×2): qty 1

## 2011-11-08 MED ORDER — ACETAMINOPHEN 325 MG PO TABS
650.0000 mg | ORAL_TABLET | Freq: Four times a day (QID) | ORAL | Status: DC | PRN
  Administered 2011-11-08 (×2): 650 mg via ORAL
  Filled 2011-11-08 (×2): qty 2

## 2011-11-08 NOTE — Progress Notes (Signed)
eLink Physician-Brief Progress Note Patient Name: David Bright DOB: Jan 05, 1961 MRN: [id number]  Date of Service  11/08/2011   HPI/Events of Note  headache   eICU Interventions  Prn order for tylenol   Intervention Category Minor Interventions: Routine modifications to care plan (e.g. PRN medications for pain, fever)  Valta Dillon 11/08/2011, 12:28 AM

## 2011-11-08 NOTE — Progress Notes (Signed)
CSW received consult for medication assistance. CSW shared this info with RN CM as they help with this issue. CSW reviewed chart and drug screen results as per chart, Pt has h/o substance abuse. UDS was negative on this admission. CSW signing off.  Vennie Homans, [place] 11/08/2011 8:16 AM [phone]

## 2011-11-08 NOTE — Progress Notes (Addendum)
David Bright is a 51 y.o. male smoker admitted on 11/07/2011 with progressive dyspnea from pulmonary edema in setting of hypertensive crisis, and acute hypercapnic respiratory failure requiring BPAP. PMHx CHF, COPD, DM, HTN, OSA, Hyperlipidemia, Polysubstance abuse, Medical non-compliance due to limited finances  Antibiotics: 3/28 vanc>>3/28  3/28 zoysn>>3/28  Tests/events:  SUBJECTIVE: Breathing better.  Still has dry cough.  Denies chest/abd pain.  Feels hungry.  OBJECTIVE:  Blood pressure 115/70, pulse 84, temperature 98.1 F (36.7 C), temperature source Core (Comment), resp. rate 21, height 5\' 9"  (1.753 m), weight 287 lb 11.2 oz (130.5 kg), SpO2 96.00%. Wt Readings from Last 3 Encounters:  11/08/11 287 lb 11.2 oz (130.5 kg)   Body mass index is 42.49 kg/(m^2).  I/O last 3 completed shifts: In: 695 [P.O.:240; I.V.:455] Out: 6500 [Urine:6500]  Physical Exam: General - no distress HEENT - no sinus tenderness Cardiac - s1s2 regular, no murmur Chest - no wheeze Abd - soft, nontender Ext - no edema Neuro - normal strength Psych - normal mood, behavior  CBC    Component Value Date/Time   WBC 9.5 11/08/2011 0200   RBC 4.96 11/08/2011 0200   HGB 14.8 11/08/2011 0200   HCT 43.6 11/08/2011 0200   PLT 240 11/08/2011 0200   MCV 87.9 11/08/2011 0200   MCH 29.8 11/08/2011 0200   MCHC 33.9 11/08/2011 0200   RDW 13.2 11/08/2011 0200   LYMPHSABS 2.8 11/07/2011 0545   MONOABS 0.6 11/07/2011 0545   EOSABS 0.1 11/07/2011 0545   BASOSABS 0.0 11/07/2011 0545    BMET    Component Value Date/Time   NA 138 11/08/2011 0200   K 3.4* 11/08/2011 0200   CL 99 11/08/2011 0200   CO2 30 11/08/2011 0200   GLUCOSE 117* 11/08/2011 0200   BUN 15 11/08/2011 0200   CREATININE 0.94 11/08/2011 0200   CALCIUM 9.1 11/08/2011 0200   GFRNONAA >90 11/08/2011 0200   GFRAA >90 11/08/2011 0200    ABG    Component Value Date/Time   PHART 7.421 11/08/2011 0500   PCO2ART 39.3 11/08/2011 0500   PO2ART 76.9*  11/08/2011 0500   HCO3 25.4* 11/08/2011 0500   TCO2 22.2 11/08/2011 0500   ACIDBASEDEF 1.9 11/07/2011 1017   O2SAT 97.2 11/08/2011 0500   BNP (last 3 results)  Basename 11/07/11 0545  PROBNP 1180.0*    Cardiac Panel (last 3 results)  Basename 11/08/11 0200 11/07/11 1721 11/07/11 0930  CKTOTAL 248* 191 183  CKMB 5.6* 5.0* 4.9*  TROPONINI 1.28* 1.25* 1.22*  RELINDX 2.3 2.6* 2.7*     Dg Chest Portable 1 View  11/07/2011  *RADIOLOGY REPORT*  Clinical Data: Shortness of breath.  PORTABLE CHEST - 1 VIEW  Comparison: Chest radiograph performed 05/21/2010  Findings: Vascular congestion is noted, with bilateral central airspace opacities, concerning for recurrent pulmonary edema.  No definite pleural effusion is identified, though the left costophrenic angle is incompletely imaged.  No pneumothorax is seen.  The cardiomediastinal silhouette remains significantly enlarged; underlying pericardial effusion cannot be excluded, though cardiomegaly appears grossly stable from the prior study.  No acute osseous abnormalities are identified.  IMPRESSION: Vascular congestion and cardiomegaly, with bilateral central airspace opacities, concerning for recurrent pulmonary edema. Underlying pericardial effusion cannot be excluded, though cardiomegaly appears grossly stable from 2011.  Original Report Authenticated By: Tonia Ghent, M.D.    ASSESSMENT/PLAN:  Acute respiratory failure 2nd to acute pulmonary edema>>much improved 3/29 -titrate oxygen to keep SpO2 > 90%  Hypertensive crisis -change to po lasix and  decrease dose -continue metoprolol -prn hydralazine  Elevated troponin -continue ASA, metoprolol -previously seen by Dr. Daleen Squibb with [facility] cards in 2011>>will need further cards eval -f/u Echo  DM -continue SSI -advance to CHO modified diet  OSA -auto CPAP qhs  Hx of smoking with reported hx of COPD -will need further assessment with PFT when more stable -needs to stop smoking -prn  inhaler therapy  Disposition -transfer to telemetry -OOB to chair -d/c foley -lovenox for DVT proph  Will ask [facility] to assume care from 3/30 and PCCM sign off.  Danine Hor Pager:  [phone] 11/08/2011, 7:46 AM

## 2011-11-08 NOTE — Consult Note (Signed)
Cardiology Consult Note   Patient ID: RUTILIO YELLOWHAIR MRN: [id number], DOB/AGE: [birth year]   Admit date: 11/07/2011 Date of Consult: 11/08/2011  Primary Physician: No primary provider on file. Primary Cardiologist: Previously seen by Dr. Daleen Squibb  Pt. Profile: Mr. Mccubbin is a 51yo male with PMHx significant for NICM with resultant chronic systolic CHF, COPD, OSA, type 2 DM, HTN, HL, tobacco abuse, medication noncompliance (due to financial obstacles) and history of polysubstance abuse who was admitted to [facility] [place] [facility] on 11/07/11 with flash pulmonary edema secondary to hypertensive crisis.   2002 cardiac catheterization: at [facility] which the patient reports was "clear." July 16, 2006, 2-D echocardiogram: ejection fraction 35-40%, mild mitral regurgitation. September 08, 2006, exercise Myoview: ejection fraction 33% without ischemia.  Anteroseptal inferior fixed defects.  2D echo 10/11: Study Conclusions  - Left ventricle: The cavity size was severely dilated. Mellody Masri   thickness was increased in a pattern of mild LVH. Systolic   function was severely reduced. The estimated ejection fraction was   in the range of 25% to 30%. Diffuse hypokinesis. Features are   consistent with a pseudonormal left ventricular filling pattern,   with concomitant abnormal relaxation and increased filling   pressure (grade 2 diastolic dysfunction). - Aortic valve: Trivial regurgitation. - Mitral valve: Mild regurgitation. - Left atrium: The atrium was moderately dilated. - Pericardium, extracardiac: A trivial pericardial effusion was   identified. There was a left pleural effusion. Transthoracic echocardiography. M-mode, complete 2D, spectral Doppler, and color Doppler. Height: Height: 182.9cm. Height: 72in. Weight: Weight: 131.5kg. Weight: 289.4lb. Body mass index: BMI: 39.3kg/m\S\2. Body surface area: BSA: 2.12m\S\2. Blood pressure: 137/78. Patient status: Inpatient. Location:  Bedside.  --------------------------------------------------------------------  -------------------------------------------------------------------- Left ventricle: The cavity size was severely dilated. Cotey Rakes thickness was increased in a pattern of mild LVH. Systolic function was severely reduced. The estimated ejection fraction was in the range of 25% to 30%. Diffuse hypokinesis. Features are consistent with a pseudonormal left ventricular filling pattern, with concomitant abnormal relaxation and increased filling pressure (grade 2 diastolic dysfunction).  -------------------------------------------------------------------- Aortic valve: Trileaflet; normal thickness leaflets. Mobility was not restricted. Doppler: Transvalvular velocity was within the normal range. There was no stenosis. Trivial regurgitation.  -------------------------------------------------------------------- Aorta: Aortic root: The aortic root was normal in size.  -------------------------------------------------------------------- Mitral valve: Structurally normal valve. Mobility was not restricted. Doppler: Transvalvular velocity was within the normal range. There was no evidence for stenosis. Mild regurgitation.  Peak gradient: 3mm Hg (D).  -------------------------------------------------------------------- Left atrium: The atrium was moderately dilated.  -------------------------------------------------------------------- Right ventricle: The cavity size was normal. Systolic function was normal.  -------------------------------------------------------------------- Pulmonic valve:  Doppler: Transvalvular velocity was within the normal range. There was no evidence for stenosis.  -------------------------------------------------------------------- Tricuspid valve: Structurally normal valve. Doppler: Transvalvular velocity was within the normal range. No  regurgitation.  -------------------------------------------------------------------- Right atrium: The atrium was normal in size.  -------------------------------------------------------------------- Pericardium: A trivial pericardial effusion was identified.  -------------------------------------------------------------------- Systemic veins: Inferior vena cava: The vessel was normal in size.  -------------------------------------------------------------------- Pleura: There was a left pleural effusion.  --------------------------------------------------------------------  2D measurements            Normal  Doppler measurements      Normal Left ventricle                     Left ventricle LVID ED,      73.79 mm     43-52   Ea, lat ann,   11.9 cm/s  ------- chord, PLAX  tiss DP LVID ES,         61 mm     23-38   E/Ea, lat      7.49       ------- chord, PLAX                        ann, tiss DP FS, chord,       17 %      >29     Ea, med ann,    7.9 cm/s  ------- PLAX                               tiss DP LVPW, ED      13.19 mm     ------  E/Ea, med     11.28       ------- IVS/LVPW       0.92        <1.3    ann, tiss DP ratio, ED                          Mitral valve Ventricular septum                 Peak E vel     89.1 cm/s  ------- IVS, ED       12.18 mm     ------  Peak A vel       61 cm/s  ------- Aorta                              Deceleration    127 ms    150-230 Root diam, ED    31 mm     ------  time Left atrium                        Peak              3 mm Hg ------- AP dim           43 mm     ------  gradient, D AP dim index   1.73 cm/m\S\2 <2.2    Peak E/A        1.5       -------                                    ratio                                    Max regurg      516 cm/s  -------                                    vel                                    Systemic veins  Estimated CVP    10 mm Hg  -------   Reason for consult: evaluation/management of pulmonary edema with + cardiac biomarkers  Problem List: Past Medical History  Diagnosis Date  . CHF (congestive heart failure)     Systolic and diastolic  . COPD (chronic obstructive pulmonary disease)   . Diabetes mellitus   . Hypertension   . Sleep apnea     Nocturnal CPAP  . Hyperlipidemia   . Non compliance with medical treatment   . Polysubstance abuse     History of quitting alcohol, marijuana and crack over 12 years ago  . Non-ischemic cardiomyopathy     Normal cath 2002    Past Surgical History  Procedure Date  . None      Allergies: No Known Allergies  HPI:   Of note, he had a similar type of presentation in the setting of medication noncompliance in 05/2010 which improved rapidly with Lasix diuresis. [place] cardiology was consulted, agreed with diuresis and the addition of Lisinopril and Coreg. The plan was for an echo (described above) and cardiac cath. I do not see a record of the latter. He has been started on an ACEi, ASA, BB, statin and Lasix. Additionally, he was newly diagnosed with type 2 DM and started on glipizide with a recommendation of further titration according to his CBGs by his PCP.   He reports after discharge, he followed with [facility] initially and received his medications through them at 6 months at a time.Marland Kitchen He states that he was compliant with medications, but the cost began increasing and he could not afford his medications. He has since been out of his medications 7-8 months. He still uses his CPAP daily.      He was in his usual state of health until 2-3 days ago when he felt short of breath when sleeping and laying flat on his back. He took Mucinex with some relief. He awoke the following morning feeling better, went to work and felt short of breath again. He could only lay on his sides to breathe better. He endorsed PND. CPAP provided no relief. He went to the bathroom and coughed up  blood-tinged fluid. He endorses chest pain during this time lasting for 1-2 minutes occuring with exertion. He denies diaphoresis, lightheadedness, palpitations, n/v, fevers, chills, sick contacts or elicit drug use. He states that he does not eat fast food that often and has tried to adjust his diet so that his BP remains controlled.  He reports continue tobacco use at 1/2 PPD. He informed his housemate who transported him to [facility] ED.   Upon ED arrival, EKG revealed sinus tachycardia without ischemic changes. CXR revealed vascular congestion and prominent cardiomegaly (stable from prior 2011 radiograph). Notable lab values- Glu 468, UA with > 1000 glucose, no evidence of UTI, pCO2 at 59.9, pH 7.215. First set CK/MB/TnI elevated at 183/4.9/1.22. pBNP elevated. UDS negative. WBC WNL. He was started on NIMVS, given one dose of Lasix with 1500cc output and breathing improved. He was subsequently admitted by pulm/CCM, diuresed, started on BB, hydralazine. SSI for hypoglycemia. CEs returned elevated with an small up-trend. He has diuresed well at net: - [phone] since admission.   Home Medications: Prior to Admission medications   Not on File    Inpatient Medications:     . aspirin  325 mg Oral Once  . enoxaparin (LOVENOX) injection  40 mg Subcutaneous Q24H  . furosemide  40 mg Oral Daily  . insulin aspart  0-20  Units Subcutaneous Q4H  . magnesium sulfate 1 - 4 g bolus IVPB  2 g Intravenous Once  . metoprolol tartrate  25 mg Oral BID  . potassium chloride SA      . potassium chloride  40 mEq Oral Once  . potassium chloride  40 mEq Oral Once  . DISCONTD: furosemide  60 mg Intravenous BID  . DISCONTD: metoprolol tartrate  25 mg Oral BID  . DISCONTD: pantoprazole (PROTONIX) IV  40 mg Intravenous QHS   No prescriptions prior to admission    Family History  Problem Relation Age of Onset  . Cancer Mother      History   Social History  . Marital Status: Single    Spouse Name: N/A    Number of  Children: N/A  . Years of Education: N/A   Occupational History  . 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 Smoking status: Current Everyday Smoker -- 1.0 packs/day for 33 years    Types: Cigarettes  . Smokeless tobacco: Never Used  . Alcohol Use: No     QUIT IN 2000  . Drug Use: No  . Sexually Active: No   Other Topics Concern  . Not on file   Social History Narrative   Lives in [place], [place] with housemate.      Review of Systems: General: negative for chills, fever, night sweats or weight changes.  Cardiovascular: negative for chest pain, dyspnea on exertion, edema, orthopnea, palpitations, paroxysmal nocturnal dyspnea or shortness of breath Dermatological: positive for rash- RLE no erythema, discharge or tenderness Respiratory: positive for cough productive of blood-tinged sputum, positive for trace wheezing Urologic: negative for hematuria Abdominal: negative for nausea, vomiting, diarrhea, bright red blood per rectum, melena, or hematemesis Neurologic: negative for visual changes, syncope, or dizziness All other systems reviewed and are otherwise negative except as noted above.  Physical Exam: Blood pressure 111/74, pulse 86, temperature 97.4 F (36.3 C), temperature source Oral, resp. rate 18, height 5\' 9"  (1.753 m), weight 130.5 kg (287 lb 11.2 oz), SpO2 96.00%.    General: Obese, well nourished, in no acute distress. Head: Normocephalic, atraumatic, sclera non-icteric, no xanthomas Neck: Negative for carotid bruits. JVD not elevated. Lungs: Breathing unlabored. Trace bibasilar rales noted. No wheezes or rhonchi Heart: RRR with S1, fixed split S2. No murmurs, rubs, or gallops appreciated. Abdomen: Soft, non-tender, non-distended with normoactive bowel sounds. No hepatomegaly. No rebound/guarding. No obvious abdominal masses. Msk:  Strength and tone appears normal for age. Extremities: Posterior RLE- 2 cm x 2 cm nodular mass. No erythema,  tenderness or discharge. No clubbing, cyanosis or edema.  Distal pedal pulses are 2+ and equal bilaterally. Neuro: Alert and oriented X 3. Moves all extremities spontaneously. Psych: Responds to questions appropriately with a normal affect.  Labs: Recent Labs  Basename 11/08/11 0200 11/07/11 0559 11/07/11 0545   WBC 9.5 -- 9.1   HGB 14.8 17.0 --   HCT 43.6 50.0 --   MCV 87.9 -- 89.5   PLT 240 -- 250    Lab 11/08/11 0200 11/07/11 2026 11/07/11 0559  NA 138 139 138  K 3.4* 3.5 4.2  CL 99 99 105  CO2 30 30 --  BUN 15 15 17   CREATININE 0.94 0.87 0.70  CALCIUM 9.1 9.2 --  PROT -- -- --  BILITOT -- -- --  ALKPHOS -- -- --  ALT -- -- --  AST -- -- --  AMYLASE -- -- --  LIPASE -- -- --  GLUCOSE 117* 137* 358*    Recent Labs  Basename 11/08/11 0200 11/07/11 1721 11/07/11 0930   CKTOTAL 248* 191 183   CKMB 5.6* 5.0* 4.9*   CKMBINDEX -- -- --   TROPONINI 1.28* 1.25* 1.22*   Radiology/Studies: Dg Chest Portable 1 View  11/07/2011  *RADIOLOGY REPORT*  Clinical Data: Shortness of breath.  PORTABLE CHEST - 1 VIEW  Comparison: Chest radiograph performed 05/21/2010  Findings: Vascular congestion is noted, with bilateral central airspace opacities, concerning for recurrent pulmonary edema.  No definite pleural effusion is identified, though the left costophrenic angle is incompletely imaged.  No pneumothorax is seen.  The cardiomediastinal silhouette remains significantly enlarged; underlying pericardial effusion cannot be excluded, though cardiomegaly appears grossly stable from the prior study.  No acute osseous abnormalities are identified.  IMPRESSION: Vascular congestion and cardiomegaly, with bilateral central airspace opacities, concerning for recurrent pulmonary edema. Underlying pericardial effusion cannot be excluded, though cardiomegaly appears grossly stable from 2011.  Original Report Authenticated By: Tonia Ghent, M.D.    EKG:  11/07/11 0544: sinus tachycardia, 121 bpm,  LAE, no ST-T wave changes 11/07/10 0658: NSR, 99 bpm, LAE, no ST-T wave changes  ASSESSMENT AND PLAN:   1. Acute on chronic mixed CHF- patient with history of systolic and diastolic dysfunction noted on prior echos. This may be secondary to longstanding HTN, he has not had a recent ischemic work-up. Previous stress test with inferior anteroseptal fixed defects and no evidence of reversible ischemia in 2008. Clean cath in 2002. No evidence of a more recent study, although this was planned on prior admission (? Noncompliance). This was likely exacerbated by medication noncompliance and in the setting of marked hypertension. Today he notes breathing is much improved. He is at 96% O2 sat on n/c. On exam, lungs sound relatively clear with the exception of faint bibasilar rales. No increased respiratory effort.  On admission, pBNP elevated at 1180. CXR revealed evidence of pulmonary edema and prominent cardiomegaly. He had been started on ACEi, BB, statin in the past, but has not continued this regimen. Would favor continuing diuresis with aim for cardiac catheterization, however compliance is an issue should he require intervention. May benefit from medication optimization in the short term prior to pursuing further ischemic work-up or ICD consideration.   - Add low-dose ACEi  - Continue Lasix, BB  - Will review 2D echo   2. Troponin leak- noted this admission in the setting of #1. As previously stated, patient does not have a recent ischemic work-up. He certainly has cardiac risk factors and his associated chest pain noted in the HPI is concerning for angina.   - Add statin  - Continue ASA  3. Type 2 DM- uncontrolled, on SSI per CCM  4. HTN- much more controlled with diuresis, and the addition of BB, Hydralazine  - Would continue  5. Tobacco use- would benefit from tobacco cessation counseling  6. OSA- CPAP, per CCM   Signed, R. Hurman Horn, PA-C 11/08/2011, 5:33 PM    I have taken a  history, reviewed medications, allergies, PMH, SH, FH, and reviewed ROS and examined the patient.  I agree with the assessment and plan as discussed with Hurman Horn PA-C and patient. Can discharge this weekend with follow up with me within 10 days. If he will comply with meds and stop smoking he could do  relatively well. Also wants his CPAP reassessed. I spent over 20 mins talking about changing his lifestyle ,  taking his meds, keeping his appointments, and not smoking.At this point and until he is compliant I would not cath or consider a defibrillator. Jesse Sans. Daleen Squibb, MD, [facility] Pager:  [phone]

## 2011-11-08 NOTE — Progress Notes (Signed)
eLink Physician-Brief Progress Note Patient Name: DEWIE AHART DOB: 1961-03-05 MRN: [id number]  Date of Service  11/08/2011   HPI/Events of Note  hypokalemia   eICU Interventions  Potassium replaced   Intervention Category Minor Interventions: Electrolytes abnormality - evaluation and management  Abigael Mogle 11/08/2011, 5:55 AM

## 2011-11-08 NOTE — Progress Notes (Signed)
Inpatient Diabetes Program Recommendations  AACE/ADA: New Consensus Statement on Inpatient Glycemic Control (2009)  Target Ranges:  Prepandial:   less than 140 mg/dL      Peak postprandial:   less than 180 mg/dL (1-2 hours)      Critically ill patients:  140 - 180 mg/dL   Reason for Visit: Hyperglycemia Results for WINFIELD, CABA (MRN [id number]) as of 11/08/2011 08:37  Ref. Range 11/07/2011 06:11 11/07/2011 10:56 11/07/2011 13:37 11/07/2011 15:37 11/07/2011 20:11 11/07/2011 23:34 11/08/2011 03:58 11/08/2011 07:32  Glucose-Capillary Latest Range: 70-99 mg/dL 829 (H) 562 (H) 130 (H) 334 (H) 164 (H) 84 107 (H) 160 (H)    Inpatient Diabetes Program Recommendations HgbA1C: Need updated HgbA1C to assess glycemic control prior to hospitalization  Note: Hopefully case manager can assist with pt obtaining PCP to manage diabetes.  Will probably need meal coverage insulin.  Will follow.

## 2011-11-09 ENCOUNTER — Encounter (HOSPITAL_COMMUNITY): Payer: Self-pay | Admitting: Internal Medicine

## 2011-11-09 DIAGNOSIS — E119 Type 2 diabetes mellitus without complications: Secondary | ICD-10-CM | POA: Diagnosis present

## 2011-11-09 DIAGNOSIS — I10 Essential (primary) hypertension: Secondary | ICD-10-CM | POA: Diagnosis present

## 2011-11-09 DIAGNOSIS — Z9989 Dependence on other enabling machines and devices: Secondary | ICD-10-CM | POA: Diagnosis present

## 2011-11-09 DIAGNOSIS — I428 Other cardiomyopathies: Secondary | ICD-10-CM | POA: Diagnosis present

## 2011-11-09 DIAGNOSIS — J449 Chronic obstructive pulmonary disease, unspecified: Secondary | ICD-10-CM | POA: Diagnosis present

## 2011-11-09 DIAGNOSIS — E785 Hyperlipidemia, unspecified: Secondary | ICD-10-CM | POA: Diagnosis present

## 2011-11-09 DIAGNOSIS — G4733 Obstructive sleep apnea (adult) (pediatric): Secondary | ICD-10-CM | POA: Diagnosis present

## 2011-11-09 LAB — BASIC METABOLIC PANEL
BUN: 16 mg/dL (ref 6–23)
CO2: 28 mEq/L (ref 19–32)
Calcium: 8.5 mg/dL (ref 8.4–10.5)
Glucose, Bld: 245 mg/dL — ABNORMAL HIGH (ref 70–99)
Potassium: 3.6 mEq/L (ref 3.5–5.1)
Sodium: 134 mEq/L — ABNORMAL LOW (ref 135–145)

## 2011-11-09 LAB — CBC
Hemoglobin: 13.6 g/dL (ref 13.0–17.0)
MCH: 29.3 pg (ref 26.0–34.0)
MCHC: 32.7 g/dL (ref 30.0–36.0)
MCV: 89.7 fL (ref 78.0–100.0)
RBC: 4.64 MIL/uL (ref 4.22–5.81)

## 2011-11-09 LAB — GLUCOSE, CAPILLARY
Glucose-Capillary: 233 mg/dL — ABNORMAL HIGH (ref 70–99)
Glucose-Capillary: 324 mg/dL — ABNORMAL HIGH (ref 70–99)

## 2011-11-09 MED ORDER — PRAVASTATIN SODIUM 40 MG PO TABS: 40.0000 mg | ORAL_TABLET | Freq: Every day | ORAL | Status: DC

## 2011-11-09 MED ORDER — LORATADINE-PSEUDOEPHEDRINE ER 5-120 MG PO TB12: 1.0000 | ORAL_TABLET | Freq: Two times a day (BID) | ORAL | Status: DC

## 2011-11-09 MED ORDER — METOPROLOL TARTRATE 50 MG PO TABS: 50.0000 mg | ORAL_TABLET | Freq: Two times a day (BID) | ORAL | Status: DC

## 2011-11-09 MED ORDER — ASPIRIN 325 MG PO TABS: 325.0000 mg | ORAL_TABLET | Freq: Once | ORAL | Status: DC

## 2011-11-09 MED ORDER — METFORMIN HCL 500 MG PO TABS: 500.0000 mg | ORAL_TABLET | Freq: Two times a day (BID) | ORAL | Status: DC

## 2011-11-09 MED ORDER — LISINOPRIL 5 MG PO TABS: 5.0000 mg | ORAL_TABLET | Freq: Every day | ORAL | Status: DC

## 2011-11-09 MED ORDER — METOPROLOL TARTRATE 50 MG PO TABS
50.0000 mg | ORAL_TABLET | Freq: Two times a day (BID) | ORAL | Status: DC
  Administered 2011-11-09: 50 mg via ORAL
  Filled 2011-11-09 (×2): qty 1

## 2011-11-09 MED ORDER — FUROSEMIDE 40 MG PO TABS: 40.0000 mg | ORAL_TABLET | Freq: Every day | ORAL | Status: DC

## 2011-11-09 NOTE — Discharge Summary (Signed)
Discharge Summary  David Bright MR#: [id number]  DOB:04-Oct-1960  Date of Admission: 11/07/2011 Date of Discharge: 11/09/2011  Patient's PCP: No primary provider on file.  Attending Physician:Quenna Doepke  Consults: Treatment Team:  #1 cardiology/ Lbcardiology: Dr. Valera Castle, MD   Discharge Diagnoses: Acute respiratory failure Present on Admission:  .Acute respiratory failure .Hypertensive crisis .Acute pulmonary edema .NICM (nonischemic cardiomyopathy) .OSA on CPAP .HTN (hypertension) .Hyperlipidemia .COPD (chronic obstructive pulmonary disease) .DM (diabetes mellitus)   Brief Admitting History and Physical 51 yo AAM, smoker,non-compliant with antihypertensive medication times 9 months who presents with increased sob. He has had similar episodes the was last 1 year ago. He presented with frothy pink sputum, sbp>200, coarse wet breath sounds. He was close to intubation when lasix and NIMVS turned him around. He is comfortable on NIMVS and has responded to lasix with 1500cc of urine. He does have OSA and has old cpap machine but no primary care MD, cannot afford meds. Due to his HTN crisis , hypercarbic resp distress PCCM has been asked to admit. Antibiotics will be discontinued For the rest of the admission history and physical see H&P dictated by Dr. Vassie Loll.  Discharge Medications Medication List  As of 11/09/2011 10:22 AM   START taking these medications         aspirin 325 MG tablet   Take 1 tablet (325 mg total) by mouth once.      furosemide 40 MG tablet   Commonly known as: LASIX   Take 1 tablet (40 mg total) by mouth daily.      lisinopril 5 MG tablet   Commonly known as: PRINIVIL,ZESTRIL   Take 1 tablet (5 mg total) by mouth daily.      loratadine-pseudoephedrine 5-120 MG per tablet   Commonly known as: CLARITIN-D 12-hour   Take 1 tablet by mouth 2 (two) times daily. tAKE FOR 5 DAYS.      metFORMIN 500 MG tablet   Commonly known as: GLUCOPHAGE   Take 1  tablet (500 mg total) by mouth 2 (two) times daily with a meal.      metoprolol 50 MG tablet   Commonly known as: LOPRESSOR   Take 1 tablet (50 mg total) by mouth 2 (two) times daily.      pravastatin 40 MG tablet   Commonly known as: PRAVACHOL   Take 1 tablet (40 mg total) by mouth daily.          Where to get your medications    These are the prescriptions that you need to pick up.   You may get these medications from any pharmacy.         aspirin 325 MG tablet   furosemide 40 MG tablet   lisinopril 5 MG tablet   loratadine-pseudoephedrine 5-120 MG per tablet   metFORMIN 500 MG tablet   metoprolol 50 MG tablet   pravastatin 40 MG tablet           Hospital Course: This was the first day I took care of this patient and he was stable for discharge per cardiology. #1 Acute respiratory failure The patient was admitted to the critical care team and acute respiratory failure which was felt to be secondary to acute on chronic combined heart failure and hypertensive crisis due to medical noncompliance secondary to financial issues. Patient was admitted to the step down unit and placed on BiPAP. Patient was diuresed aggressively with IV Lasix and metoprolol was added to his regimen. Cardiac enzymes were cycled which  did have an elevated troponin. Patient was monitored. Patient's had good diureses and improved clinically. Patient's IV Lasix was subsequently changed to oral Lasix and the dose decreased. Metoprolol was added to his regimen. A cardiology consultation was obtained as patient did have an elevated troponin. Patient was seen in consultation by Dr. Maisie Fus wall of about cardiology on 11/08/2011. Due to patient's past medical history of nonischemic cardiomyopathy and do too compliance issues it was felt to benefit from medication up to my sedation in the short-term prior to pursuing further ischemic cardiac workup. A low-dose ACE inhibitor was added to patient's regimen and a 2-D echo  had been ordered results are pending at the time of discharge. It was felt that patient's troponin leak was secondary to his acute on chronic next CHF. A statin was added to his regimen and was continued on aspirin. As patient had improved he was transferred from the step down unit to the telemetry floor and placed on the hospitalist service. Today was my first ever seeing this patient. Patient improved clinically such that by day of discharge was in stable and improved condition. Patient will followup with cardiology as outpatient in 1 week.  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 Patient had not had a recent ischemic workup at that time. Cardiac enzymes which was cycled did have an elevated troponin. A 2-D echo was ordered however results are pending at the time of discharge. Patient was placed on IV Lasix and diuresis aggressively in the step down unit. On admission patient's BNP was elevated at 1180 and his chest x-ray revealed evidence of pulmonary edema prominent cardiomegaly. A beta blocker was added to his regimen cardiology consultation was obtained. It was felt that at this time patient will benefit from medical optimize aeration in the short-term prior to pursuing further ischemic workup. A low-dose ACE inhibitor was added as well as a statin and aspirin. Patient improved clinically such that by day of discharge was in stable and improved condition. Patient is to followup within one week as outpatient with cardiology for further evaluation and management.  #3 type 2 diabetes Patient had not been on any medications secondary to financial issues. An A1c was not checked during this hospitalization. However patient had been stable for discharge. Patient's blood sugars were in the 200s during this hospitalization. Patient be discharged on Glucophage 500 mg twice  daily and he would need a PCP to followup with. Case manager is helping to aid in finding patient's PCP.  #4 hypertensive crisis On admission patient was noted to be in a hypertensive crisis. With systolic blood pressures in the 190s. 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 Patient was diuresed with IV Lasix his. Beta blocker was added to his regimen. Low dose ACE inhibitor was also added to his regimen with significant improvement. Patient tolerated his medications well. On day of discharge patient's blood pressure was 112/73. Patient be discharged in stable and improved condition. Patient will followup with cardiology 1 week post discharge. Case manager is helping today patient finding that PCP.  The rest of patient's chronic medical issues remained stable throughout the hospitalization the patient was discharged in stable and improved condition.  Present on Admission:  .  Acute respiratory failure .Hypertensive crisis .Acute pulmonary edema .NICM (nonischemic cardiomyopathy) .OSA on CPAP .HTN (hypertension) .Hyperlipidemia .COPD (chronic obstructive pulmonary disease) .DM (diabetes mellitus)   Day of Discharge BP 112/73  Pulse 77  Temp(Src) 98.2 F (36.8 C) (Oral)  Resp 18  Ht 5\' 9"  (1.753 m)  Wt 129.003 kg (284 lb 6.4 oz)  BMI 42.00 kg/m2  SpO2 95% Subjective: Patient denies any chest pain. No shortness of breath. Patient states he feels much better. Patient has some complaints of sinus clear drainage. General: Alert, awake, oriented x3, in no acute distress. Heart: Regular rate and rhythm, without murmurs, rubs, gallops. Lungs: Clear to auscultation bilaterally. Abdomen: Soft, nontender, nondistended, positive bowel sounds. Extremities: No clubbing cyanosis or edema with positive pedal  pulses.   Results for orders placed during the hospital encounter of 11/07/11 (from the past 48 hour(s))  GLUCOSE, CAPILLARY     Status: Abnormal   Collection Time   11/07/11 10:56 AM      Component Value Range Comment   Glucose-Capillary 441 (*) 70 - 99 (mg/dL)    Comment 1 Notify RN     URINE RAPID DRUG SCREEN (HOSP PERFORMED)     Status: Normal   Collection Time   11/07/11 11:01 AM      Component Value Range Comment   Opiates NONE DETECTED  NONE DETECTED     Cocaine NONE DETECTED  NONE DETECTED     Benzodiazepines NONE DETECTED  NONE DETECTED     Amphetamines NONE DETECTED  NONE DETECTED     Tetrahydrocannabinol NONE DETECTED  NONE DETECTED     Barbiturates NONE DETECTED  NONE DETECTED    MRSA PCR SCREENING     Status: Normal   Collection Time   11/07/11 11:22 AM      Component Value Range Comment   MRSA by PCR NEGATIVE  NEGATIVE    GLUCOSE, CAPILLARY     Status: Abnormal   Collection Time   11/07/11  1:37 PM      Component Value Range Comment   Glucose-Capillary 389 (*) 70 - 99 (mg/dL)    Comment 1 Notify RN     GLUCOSE, CAPILLARY     Status: Abnormal   Collection Time   11/07/11  3:37 PM      Component Value Range Comment   Glucose-Capillary 334 (*) 70 - 99 (mg/dL)    Comment 1 Notify RN     CARDIAC PANEL(CRET KIN+CKTOT+MB+TROPI)     Status: Abnormal   Collection Time   11/07/11  5:21 PM      Component Value Range Comment   Total CK 191  7 - 232 (U/L)    CK, MB 5.0 (*) 0.3 - 4.0 (ng/mL)    Troponin I 1.25 (*) <0.30 (ng/mL)    Relative Index 2.6 (*) 0.0 - 2.5    GLUCOSE, CAPILLARY     Status: Abnormal   Collection Time   11/07/11  8:11 PM      Component Value Range Comment   Glucose-Capillary 164 (*) 70 - 99 (mg/dL)   BASIC METABOLIC PANEL     Status: Abnormal   Collection Time   11/07/11  8:26 PM      Component Value Range Comment   Sodium 139  135 - 145 (mEq/L)    Potassium 3.5  3.5 - 5.1 (mEq/L)    Chloride 99  96 - 112 (mEq/L)    CO2 30  19 - 32 (mEq/L)     Glucose, Bld  137 (*) 70 - 99 (mg/dL)    BUN 15  6 - 23 (mg/dL)    Creatinine, Ser 0.98  0.50 - 1.35 (mg/dL)    Calcium 9.2  8.4 - 10.5 (mg/dL)    GFR calc non Af Amer >90  >90 (mL/min)    GFR calc Af Amer >90  >90 (mL/min)   GLUCOSE, CAPILLARY     Status: Normal   Collection Time   11/07/11 11:34 PM      Component Value Range Comment   Glucose-Capillary 84  70 - 99 (mg/dL)    Comment 1 Notify RN     CARDIAC PANEL(CRET KIN+CKTOT+MB+TROPI)     Status: Abnormal   Collection Time   11/08/11  2:00 AM      Component Value Range Comment   Total CK 248 (*) 7 - 232 (U/L)    CK, MB 5.6 (*) 0.3 - 4.0 (ng/mL)    Troponin I 1.28 (*) <0.30 (ng/mL)    Relative Index 2.3  0.0 - 2.5    CBC     Status: Normal   Collection Time   11/08/11  2:00 AM      Component Value Range Comment   WBC 9.5  4.0 - 10.5 (K/uL)    RBC 4.96  4.22 - 5.81 (MIL/uL)    Hemoglobin 14.8  13.0 - 17.0 (g/dL)    HCT 11.9  14.7 - 82.9 (%)    MCV 87.9  78.0 - 100.0 (fL)    MCH 29.8  26.0 - 34.0 (pg)    MCHC 33.9  30.0 - 36.0 (g/dL)    RDW 56.2  13.0 - 86.5 (%)    Platelets 240  150 - 400 (K/uL)   BASIC METABOLIC PANEL     Status: Abnormal   Collection Time   11/08/11  2:00 AM      Component Value Range Comment   Sodium 138  135 - 145 (mEq/L)    Potassium 3.4 (*) 3.5 - 5.1 (mEq/L)    Chloride 99  96 - 112 (mEq/L)    CO2 30  19 - 32 (mEq/L)    Glucose, Bld 117 (*) 70 - 99 (mg/dL)    BUN 15  6 - 23 (mg/dL)    Creatinine, Ser 7.84  0.50 - 1.35 (mg/dL)    Calcium 9.1  8.4 - 10.5 (mg/dL)    GFR calc non Af Amer >90  >90 (mL/min)    GFR calc Af Amer >90  >90 (mL/min)   GLUCOSE, CAPILLARY     Status: Abnormal   Collection Time   11/08/11  3:58 AM      Component Value Range Comment   Glucose-Capillary 107 (*) 70 - 99 (mg/dL)   BLOOD GAS, ARTERIAL     Status: Abnormal   Collection Time   11/08/11  5:00 AM      Component Value Range Comment   FIO2 0.35      Delivery systems VENTIMASK      pH, Arterial 7.421  7.350 - 7.450      pCO2 arterial 39.3  35.0 - 45.0 (mmHg)    pO2, Arterial 76.9 (*) 80.0 - 100.0 (mmHg)    Bicarbonate 25.4 (*) 20.0 - 24.0 (mEq/L)    TCO2 22.2  0 - 100 (mmol/L)    Acid-Base Excess 1.2  0.0 - 2.0 (mmol/L)    O2 Saturation 97.2      Patient temperature 36.2      Collection site LEFT RADIAL  Drawn by [id number]      Sample type ARTERIAL      Allens test (pass/fail) PASS  PASS    GLUCOSE, CAPILLARY     Status: Abnormal   Collection Time   11/08/11  7:32 AM      Component Value Range Comment   Glucose-Capillary 160 (*) 70 - 99 (mg/dL)    Comment 1 Notify RN     GLUCOSE, CAPILLARY     Status: Abnormal   Collection Time   11/08/11 12:03 PM      Component Value Range Comment   Glucose-Capillary 287 (*) 70 - 99 (mg/dL)   GLUCOSE, CAPILLARY     Status: Abnormal   Collection Time   11/08/11  5:03 PM      Component Value Range Comment   Glucose-Capillary 195 (*) 70 - 99 (mg/dL)   GLUCOSE, CAPILLARY     Status: Abnormal   Collection Time   11/08/11  9:12 PM      Component Value Range Comment   Glucose-Capillary 295 (*) 70 - 99 (mg/dL)    Comment 1 Documented in Chart      Comment 2 Notify RN     BASIC METABOLIC PANEL     Status: Abnormal   Collection Time   11/09/11  4:30 AM      Component Value Range Comment   Sodium 134 (*) 135 - 145 (mEq/L)    Potassium 3.6  3.5 - 5.1 (mEq/L)    Chloride 98  96 - 112 (mEq/L)    CO2 28  19 - 32 (mEq/L)    Glucose, Bld 245 (*) 70 - 99 (mg/dL)    BUN 16  6 - 23 (mg/dL)    Creatinine, Ser 9.14  0.50 - 1.35 (mg/dL)    Calcium 8.5  8.4 - 10.5 (mg/dL)    GFR calc non Af Amer >90  >90 (mL/min)    GFR calc Af Amer >90  >90 (mL/min)   CBC     Status: Normal   Collection Time   11/09/11  4:30 AM      Component Value Range Comment   WBC 7.2  4.0 - 10.5 (K/uL)    RBC 4.64  4.22 - 5.81 (MIL/uL)    Hemoglobin 13.6  13.0 - 17.0 (g/dL)    HCT 78.2  95.6 - 21.3 (%)    MCV 89.7  78.0 - 100.0 (fL)    MCH 29.3  26.0 - 34.0 (pg)    MCHC 32.7  30.0 - 36.0  (g/dL)    RDW 08.6  57.8 - 46.9 (%)    Platelets 216  150 - 400 (K/uL)   MAGNESIUM     Status: Normal   Collection Time   11/09/11  4:30 AM      Component Value Range Comment   Magnesium 1.9  1.5 - 2.5 (mg/dL)   GLUCOSE, CAPILLARY     Status: Abnormal   Collection Time   11/09/11  7:57 AM      Component Value Range Comment   Glucose-Capillary 233 (*) 70 - 99 (mg/dL)     Dg Chest Portable 1 View  11/07/2011  *RADIOLOGY REPORT*  Clinical Data: Shortness of breath.  PORTABLE CHEST - 1 VIEW  Comparison: Chest radiograph performed 05/21/2010  Findings: Vascular congestion is noted, with bilateral central airspace opacities, concerning for recurrent pulmonary edema.  No definite pleural effusion is identified, though the left costophrenic angle is incompletely imaged.  No pneumothorax is seen.  The cardiomediastinal  silhouette remains significantly enlarged; underlying pericardial effusion cannot be excluded, though cardiomegaly appears grossly stable from the prior study.  No acute osseous abnormalities are identified.  IMPRESSION: Vascular congestion and cardiomegaly, with bilateral central airspace opacities, concerning for recurrent pulmonary edema. Underlying pericardial effusion cannot be excluded, though cardiomegaly appears grossly stable from 2011.  Original Report Authenticated By: Tonia Ghent, M.D.     Disposition: Home  Diet: low sodium heart healthy diet.  Activity: Increase activity slowly.   Follow-up Appts: Discharge Orders    Future Orders Please Complete By Expires   Diet - low sodium heart healthy      Increase activity slowly      Discharge instructions      Comments:   Follow up with Dr Daleen Squibb [facility] cardiology ) in 1 week Follow up with PCP in 1 week.      TESTS THAT NEED FOLLOW-UP  BMET on follow up in 1 week. Will need HGB A1C.  Time spent on discharge, talking to the patient, and coordinating care: 60 mins.   SignedRamiro Harvest 11/09/2011, 10:22  AM

## 2011-11-09 NOTE — Progress Notes (Signed)
Patient ID: David Bright, male   DOB: Dec 19, 1960, 51 y.o.   MRN: [id number]   Patient Name: David Bright Date of Encounter: 11/09/2011    SUBJECTIVE  No CP or SOB, Slept well with CPAP  CURRENT MEDS    . aspirin  325 mg Oral Once  . atorvastatin  40 mg Oral q1800  . enoxaparin (LOVENOX) injection  40 mg Subcutaneous Q24H  . furosemide  40 mg Oral Daily  . insulin aspart  0-20 Units Subcutaneous TID WC  . insulin aspart  0-5 Units Subcutaneous QHS  . lisinopril  5 mg Oral Daily  . metoprolol tartrate  25 mg Oral BID  . DISCONTD: furosemide  60 mg Intravenous BID  . DISCONTD: insulin aspart  0-20 Units Subcutaneous Q4H  . DISCONTD: metoprolol tartrate  25 mg Oral BID  . DISCONTD: pantoprazole (PROTONIX) IV  40 mg Intravenous QHS    OBJECTIVE  Filed Vitals:   11/08/11 1358 11/08/11 2108 11/09/11 0030 11/09/11 0614  BP: 111/74 134/87  112/73  Pulse: 86 94 18 77  Temp: 97.4 F (36.3 C) 98.6 F (37 C)  98.2 F (36.8 C)  TempSrc: Oral Oral  Oral  Resp: 18 18  18   Height:      Weight:    284 lb 6.4 oz (129.003 kg)  SpO2: 96% 99%  95%    Intake/Output Summary (Last 24 hours) at 11/09/11 0755 Last data filed at 11/09/11 0631  Gross per 24 hour  Intake    730 ml  Output   1065 ml  Net   -335 ml   Filed Weights   11/08/11 0243 11/08/11 0500 11/09/11 0614  Weight: 287 lb 11.2 oz (130.5 kg) 287 lb 11.2 oz (130.5 kg) 284 lb 6.4 oz (129.003 kg)    PHYSICAL EXAM  General: Pleasant, NAD. Neuro: Alert and oriented X 3. Moves all extremities spontaneously. Psych: Normal affect. HEENT:  Normal  Neck: Supple without bruits or JVD. Lungs:  Resp regular and unlabored, CTA. Heart: RRR no s3, s4, or murmurs. Abdomen: Soft, non-tender, non-distended, BS + x 4.  Extremities: No clubbing, cyanosis or edema. DP/PT/Radials 2+ and equal bilaterally.  Accessory Clinical Findings  CBC  Basename 11/09/11 0430 11/08/11 0200 11/07/11 0545  WBC 7.2 9.5 --  NEUTROABS -- -- 5.6    HGB 13.6 14.8 --  HCT 41.6 43.6 --  MCV 89.7 87.9 --  PLT 216 240 --   Basic Metabolic Panel  Basename 11/09/11 0430 11/08/11 0200 11/07/11 0930  NA 134* 138 --  K 3.6 3.4* --  CL 98 99 --  CO2 28 30 --  GLUCOSE 245* 117* --  BUN 16 15 --  CREATININE 0.72 0.94 --  CALCIUM 8.5 9.1 --  MG 1.9 -- 1.7  PHOS -- -- 5.1*   Liver Function Tests No results found for this basename: AST:2,ALT:2,ALKPHOS:2,BILITOT:2,PROT:2,ALBUMIN:2 in the last 72 hours No results found for this basename: LIPASE:2,AMYLASE:2 in the last 72 hours Cardiac Enzymes  Basename 11/08/11 0200 11/07/11 1721 11/07/11 0930  CKTOTAL 248* 191 183  CKMB 5.6* 5.0* 4.9*  CKMBINDEX -- -- --  TROPONINI 1.28* 1.25* 1.22*   BNP No components found with this basename: POCBNP:3 D-Dimer No results found for this basename: DDIMER:2 in the last 72 hours Hemoglobin A1C No results found for this basename: HGBA1C in the last 72 hours Fasting Lipid Panel No results found for this basename: CHOL,HDL,LDLCALC,TRIG,CHOLHDL,LDLDIRECT in the last 72 hours Thyroid Function Tests No results found for  this basename: TSH,T4TOTAL,FREET3,T3FREE,THYROIDAB in the last 72 hours  TELE  NSR  ECG    Radiology/Studies  Dg Chest Portable 1 View  11/07/2011  *RADIOLOGY REPORT*  Clinical Data: Shortness of breath.  PORTABLE CHEST - 1 VIEW  Comparison: Chest radiograph performed 05/21/2010  Findings: Vascular congestion is noted, with bilateral central airspace opacities, concerning for recurrent pulmonary edema.  No definite pleural effusion is identified, though the left costophrenic angle is incompletely imaged.  No pneumothorax is seen.  The cardiomediastinal silhouette remains significantly enlarged; underlying pericardial effusion cannot be excluded, though cardiomegaly appears grossly stable from the prior study.  No acute osseous abnormalities are identified.  IMPRESSION: Vascular congestion and cardiomegaly, with bilateral central  airspace opacities, concerning for recurrent pulmonary edema. Underlying pericardial effusion cannot be excluded, though cardiomegaly appears grossly stable from 2011.  Original Report Authenticated By: Tonia Ghent, M.D.    ASSESSMENT AND PLAN  Active Problems:  Acute respiratory failure  Hypertensive crisis  Acute pulmonary edema    Ready for discharge from cardiac standpoint. Will increase Lopressor to 50mg  bid. No other changes. Needs CPAP adjustment. I will see in office within 7-10 days.  Signed, Valera Castle MD

## 2011-11-09 NOTE — Progress Notes (Addendum)
CARE MANAGEMENT NOTE 11/09/2011  Patient:  David Bright, David Bright   Account Number:  [id number]  Date Initiated:  11/09/2011  Documentation initiated by:  Carlyn Lemke  Subjective/Objective Assessment:   51 yo male admitted with pulmonary congestion. NO PCP.     Action/Plan:   home when stable   Anticipated DC Date:  11/09/2011   Anticipated DC Plan:  HOME/SELF CARE  In-house referral  NA      DC Planning Services  CM consult  [facility]      Choice offered to / List presented to:  NA   DME arranged  NA      DME agency  NA     HH arranged  NA      HH agency  NA   Status of service:  Completed, signed off Medicare Important Message given?   (If response is "NO", the following Medicare IM given date fields will be blank) Date Medicare IM given:   Date Additional Medicare IM given:    Discharge Disposition:    Per UR Regulation:    If discussed at Long Length of Stay Meetings, dates discussed:    Comments:  11/09/11 1101 Janaisa Birkland,RN,BSN [phone] Cm spoke with pt concerning CM consult for PCP. Pt given information concerning [facility], [employer] center, and given [employer] generic drug list. All meds pt to dc on generic drug list.Pt offered assistance via [facility] for HF med assistance program. Pt declined, access to funds to afford generic drugs. Pt given voucher for free scale from [facility] gift shop. CM to fax pt information to [facility].

## 2011-11-13 LAB — CULTURE, BLOOD (ROUTINE X 2): Culture: NO GROWTH

## 2011-11-20 ENCOUNTER — Ambulatory Visit (INDEPENDENT_AMBULATORY_CARE_PROVIDER_SITE_OTHER): Payer: Self-pay | Admitting: Physician Assistant

## 2011-11-20 ENCOUNTER — Encounter: Payer: Self-pay | Admitting: Physician Assistant

## 2011-11-20 VITALS — BP 124/76 | HR 87 | Ht 73.0 in | Wt 287.8 lb

## 2011-11-20 DIAGNOSIS — I5022 Chronic systolic (congestive) heart failure: Secondary | ICD-10-CM

## 2011-11-20 DIAGNOSIS — I10 Essential (primary) hypertension: Secondary | ICD-10-CM

## 2011-11-20 DIAGNOSIS — E119 Type 2 diabetes mellitus without complications: Secondary | ICD-10-CM

## 2011-11-20 DIAGNOSIS — G4733 Obstructive sleep apnea (adult) (pediatric): Secondary | ICD-10-CM

## 2011-11-20 LAB — BASIC METABOLIC PANEL
Calcium: 8.6 mg/dL (ref 8.4–10.5)
GFR: 95.37 mL/min (ref >60.00)
Potassium: 3.7 mEq/L (ref 3.5–5.1)
Sodium: 140 mEq/L (ref 135–145)

## 2011-11-20 MED ORDER — METFORMIN HCL 500 MG PO TABS: 500.0000 mg | ORAL_TABLET | Freq: Two times a day (BID) | ORAL | Status: DC

## 2011-11-20 MED ORDER — FUROSEMIDE 40 MG PO TABS: 40.0000 mg | ORAL_TABLET | Freq: Every day | ORAL | Status: DC

## 2011-11-20 MED ORDER — METOPROLOL TARTRATE 50 MG PO TABS: 50.0000 mg | ORAL_TABLET | Freq: Two times a day (BID) | ORAL | Status: DC

## 2011-11-20 MED ORDER — PRAVASTATIN SODIUM 40 MG PO TABS: 40.0000 mg | ORAL_TABLET | Freq: Every day | ORAL | Status: DC

## 2011-11-20 MED ORDER — LISINOPRIL 5 MG PO TABS: 5.0000 mg | ORAL_TABLET | Freq: Every day | ORAL | Status: DC

## 2011-11-20 NOTE — Progress Notes (Signed)
[address] [place], [place]  [address] Phone: [phone] Fax:  [phone]  Date:  11/20/2011   Name:  David Bright       DOB:  07/03/1961 MRN:  [id number]  PCP:  None  Primary Cardiologist:  Dr. Valera Castle  Primary Electrophysiologist:  None    History of Present Illness: David Bright is a 51 y.o. male who presents for post hospital follow up.  He was admitted 3/20-3/30 with acute pulmonary edema in the setting of hypertensive crisis.  Previous ejection fraction by echocardiogram 05/2010 25-30%.  He was originally seen by Dr. Valera Castle in 05/2010 in a similar setting.  He was not seen in followup.  He apparently has a history of cardiac catheterization 2002 in [facility] that was negative for coronary artery disease.  Nuclear study 08/2006: Negative for ischemia, fixed defects in the anteroseptal and inferior walls, EF 33%.     He had recently run out of medications due to lack of income.  He presented with 2-3 days of increased shortness of breath.  He was diuresed and his medications were adjusted.  Troponins were elevated at 1.28, 1.25, 1.22.  Medical Rx was recommended initially.  It was not felt that he should undergo cardiac cath or evaluation for ICD until he is able to remain compliant with medical Rx.    Overall, he is doing well.  He denies chest pain.  He still has some dyspnea with exertion.  He describes class IIb symptoms.  He denies orthopnea or PND.  He denies pedal edema.  He denies syncope.  He's trying to quit smoking.  He has not been able to get an her primary care follow up.  Past Medical History  Diagnosis Date  . CHF (congestive heart failure)     Systolic and diastolic  . COPD (chronic obstructive pulmonary disease)   . Diabetes mellitus   . Hypertension   . Sleep apnea     Nocturnal CPAP  . Hyperlipidemia   . Non compliance with medical treatment   . Polysubstance abuse     History of quitting alcohol, marijuana and  crack over 12 years ago  . Non-ischemic cardiomyopathy     Normal cath 2002  . NICM (nonischemic cardiomyopathy) 11/09/2011  . OSA on CPAP 11/09/2011  . HTN (hypertension) 11/09/2011  . DM (diabetes mellitus) 11/09/2011    2-D echocardiogram 05/21/2010: Study Conclusions  - Left ventricle: The cavity size was severely dilated. Wall thickness was increased in a pattern of mild LVH. Systolic function was severely reduced. The estimated ejection fraction was in the range of 25% to 30%. Diffuse hypokinesis. Features are consistent with a pseudonormal left ventricular filling pattern, with concomitant abnormal relaxation and increased filling pressure (grade 2 diastolic dysfunction). - Aortic valve: Trivial regurgitation. - Mitral valve: Mild regurgitation. - Left atrium: The atrium was moderately dilated. - Pericardium, extracardiac: A trivial pericardial effusion was identified. There was a left pleural effusion.   Current Outpatient Prescriptions  Medication Sig Dispense Refill  . aspirin 325 MG tablet Take 1 tablet (325 mg total) by mouth once.      . furosemide (LASIX) 40 MG tablet Take 1 tablet (40 mg total) by mouth daily.  30 tablet  0  . lisinopril (PRINIVIL,ZESTRIL) 5 MG tablet Take 1 tablet (5 mg total) by mouth daily.  30 tablet  0  . loratadine-pseudoephedrine (CLARITIN-D 12 HOUR) 5-120 MG per tablet Take 1 tablet by mouth 2 (two)  times daily. tAKE FOR 5 DAYS.  10 tablet  0  . metFORMIN (GLUCOPHAGE) 500 MG tablet Take 1 tablet (500 mg total) by mouth 2 (two) times daily with a meal.  60 tablet  0  . metoprolol (LOPRESSOR) 50 MG tablet Take 1 tablet (50 mg total) by mouth 2 (two) times daily.  60 tablet  0  . pravastatin (PRAVACHOL) 40 MG tablet Take 1 tablet (40 mg total) by mouth daily.  30 tablet  0    Allergies: No Known Allergies  History  Substance Use Topics  . Smoking status: Current Everyday Smoker -- 1.0 packs/day for 33 years    Types: Cigarettes  . Smokeless  tobacco: Never Used  . Alcohol Use: No     QUIT IN 2000     ROS:  Please see the history of present illness.   All other systems reviewed and negative.   PHYSICAL EXAM: VS:  BP 124/76  Pulse 87  Ht 6\' 1"  (1.854 m)  Wt 287 lb 12.8 oz (130.545 kg)  BMI 37.97 kg/m2 Repeat blood pressure by me 110/74  Well nourished, well developed, in no acute distress HEENT: normal Neck: no JVD Cardiac:  normal S1, S2; RRR; no murmur Lungs:  clear to auscultation bilaterally, no wheezing, rhonchi or rales Abd: soft, nontender, no hepatomegaly Ext: no edema Skin: warm and dry Neuro:  CNs 2-12 intact, no focal abnormalities noted  EKG:  Sinus rhythm, heart rate 87, biatrial enlargement, interventricular conduction delay, poor R wave progression, no change from prior tracing  ASSESSMENT AND PLAN:  1. Chronic systolic heart failure  Volume is stable.  He has been able to afford his medications and is currently compliant.  We discussed limiting his salt.  We also discussed continuing to monitor his weight.  He has done a very good job of watching his weight since discharge and they have been stable.  I explained to him that he should take extra 40 mg of Lasix should his weight go up 3 pounds in one day or 5 pounds in a week.  He should call us at that time.  Followup with Dr. Daleen Squibb or me in 6 weeks.   2. HTN (hypertension)  Currently controlled.  Check a basic metabolic panel today with the initiation of ACE inhibitor.   3. DM (diabetes mellitus)  He really needs followup with a PCP.  He has tried to get in to [facility].  We will call to see if he can get an eligibility appointment.  I will refill his metformin for him.  But, he really should get off this drug with his hx of CHF.  However, he will need the ability to monitor his sugars and get appropriate follow up which would be most appropriate with his PCP.   4. OSA on CPAP  He needs to be established with [facility] who can then get him in for  sleep testing, CPAP therapy.   5.  NSTEMI      This was likely related to demand ischemia in the setting of acute pulmonary edema and HTN crisis.         Although, ACS cannot be ruled out.  Further ischemic evaluation may need to be considered in the future.  Luna Glasgow, PA-C  12:41 PM 11/20/2011

## 2011-11-20 NOTE — Patient Instructions (Signed)
Your physician recommends that you schedule a follow-up appointment in: 6 weeks Your physician recommends that you have lab work drawn today (BMP) Please watch your weight as discussed (YELLOW ZONE) if you gain 3-5 lbs in 1 day take an extra furosemide and call us  You have been referred to [facility]

## 2011-11-22 ENCOUNTER — Telehealth: Payer: Self-pay | Admitting: *Deleted

## 2011-11-22 NOTE — Telephone Encounter (Signed)
Message copied by Tarri Fuller on Fri Nov 22, 2011 12:58 PM ------      Message from: [place], [place] T      Created: Fri Nov 22, 2011  8:14 AM       Ok      Sugar high      Are we getting him into [facility] for eligibility appt?      Needs follow up on diabetes      Sherri was going to call the other day.      Tereso Newcomer, PA-C  8:14 AM 11/22/2011

## 2011-11-22 NOTE — Telephone Encounter (Signed)
lmom labs ok except glucose high, pt needs to call [phone] to [facility] for eligibilty appt to be followed for DM. Danielle Rankin

## 2011-11-26 ENCOUNTER — Telehealth: Payer: Self-pay | Admitting: Cardiology

## 2011-11-26 NOTE — Telephone Encounter (Signed)
Spoke with pt. Pt states he weighed 283 11/24/11 and 285 today. Pt states he thinks his weight gain is closer to 2 1/2 pounds since Sunday. He denies increase in SOB. He does note slight increase in edema in 1 ankle. Pt denies any other symptoms. Pt asking if he should take an extra lasix. I will forward to Tereso Newcomer for review and recommendations.

## 2011-11-26 NOTE — Telephone Encounter (Signed)
He can take an extra Lasix 20 mg today. Continue to monitor weights. Tereso Newcomer, PA-C  3:34 PM 11/26/2011

## 2011-11-26 NOTE — Telephone Encounter (Signed)
PT CALLING TO REPORT A WEIGHT GAIN OF 2 1/2 LBS SINCE Sunday, PLS CALL [phone]

## 2011-11-26 NOTE — Telephone Encounter (Signed)
Pt was notified.  

## 2012-01-03 ENCOUNTER — Encounter: Payer: Self-pay | Admitting: Cardiology

## 2012-01-03 ENCOUNTER — Ambulatory Visit (INDEPENDENT_AMBULATORY_CARE_PROVIDER_SITE_OTHER): Payer: Self-pay | Admitting: Cardiology

## 2012-01-03 VITALS — BP 120/82 | HR 72 | Ht 73.0 in | Wt 292.0 lb

## 2012-01-03 DIAGNOSIS — G4733 Obstructive sleep apnea (adult) (pediatric): Secondary | ICD-10-CM

## 2012-01-03 DIAGNOSIS — E119 Type 2 diabetes mellitus without complications: Secondary | ICD-10-CM

## 2012-01-03 DIAGNOSIS — I10 Essential (primary) hypertension: Secondary | ICD-10-CM

## 2012-01-03 DIAGNOSIS — Z9989 Dependence on other enabling machines and devices: Secondary | ICD-10-CM

## 2012-01-03 DIAGNOSIS — I428 Other cardiomyopathies: Secondary | ICD-10-CM

## 2012-01-03 MED ORDER — METFORMIN HCL 500 MG PO TABS: 500.0000 mg | ORAL_TABLET | Freq: Two times a day (BID) | ORAL | Status: DC

## 2012-01-03 MED ORDER — METOPROLOL TARTRATE 50 MG PO TABS: 50.0000 mg | ORAL_TABLET | Freq: Two times a day (BID) | ORAL | Status: DC

## 2012-01-03 MED ORDER — PRAVASTATIN SODIUM 40 MG PO TABS: 40.0000 mg | ORAL_TABLET | Freq: Every day | ORAL | Status: DC

## 2012-01-03 MED ORDER — LISINOPRIL 5 MG PO TABS: 5.0000 mg | ORAL_TABLET | Freq: Every day | ORAL | Status: DC

## 2012-01-03 MED ORDER — FUROSEMIDE 40 MG PO TABS: 40.0000 mg | ORAL_TABLET | Freq: Every day | ORAL | Status: DC

## 2012-01-03 NOTE — Patient Instructions (Signed)
Your physician recommends that you continue on your current medications as directed. Please refer to the Current Medication list given to you today.  Your physician discussed the hazards of tobacco use. Tobacco use cessation is recommended and techniques and options to help you quit were discussed.   Your physician wants you to follow-up in: 1 year You will receive a reminder letter in the mail two months in advance. If you don't receive a letter, please call our office to schedule the follow-up appointment.  

## 2012-01-03 NOTE — Assessment & Plan Note (Signed)
Patient advised to check his blood sugar on a regular basis. Glucophage was represcribed.

## 2012-01-03 NOTE — Assessment & Plan Note (Signed)
Blood pressure markedly under control with minimal medications. I reminded patient that just simply taking his medications and watching his weight we'll keep him probably out-of-hospital of a hypertensive crisis. For now, he seems to buy into this and will be compliant.

## 2012-01-03 NOTE — Progress Notes (Signed)
HPI [name] returns today for close followup after hospitalization for hypertensive crisis and pulmonary edema. He has a nonischemic cardiomyopathy and obstructive sleep apnea.  Since discharge she is taking his medications religiously. His blood pressures remarkably good even on the low-dose blood pressure medicine. He denies any chest pain, orthopnea or PND. He needs renewal on his Glucophage.  Past Medical History  Diagnosis Date  . CHF (congestive heart failure)     Systolic and diastolic  . COPD (chronic obstructive pulmonary disease)   . Diabetes mellitus   . Hypertension   . Sleep apnea     Nocturnal CPAP  . Hyperlipidemia   . Non compliance with medical treatment   . Polysubstance abuse     History of quitting alcohol, marijuana and crack over 12 years ago  . Non-ischemic cardiomyopathy     Normal cath 2002  . NICM (nonischemic cardiomyopathy) 11/09/2011  . OSA on CPAP 11/09/2011  . HTN (hypertension) 11/09/2011  . DM (diabetes mellitus) 11/09/2011    Current Outpatient Prescriptions  Medication Sig Dispense Refill  . aspirin 325 MG tablet Take 1 tablet (325 mg total) by mouth once.      Marland Kitchen lisinopril (PRINIVIL,ZESTRIL) 5 MG tablet Take 1 tablet (5 mg total) by mouth daily.  30 tablet  11  . loratadine-pseudoephedrine (CLARITIN-D 12 HOUR) 5-120 MG per tablet Take 1 tablet by mouth 2 (two) times daily. tAKE FOR 5 DAYS.  10 tablet  0  . metFORMIN (GLUCOPHAGE) 500 MG tablet Take 1 tablet (500 mg total) by mouth 2 (two) times daily with a meal.  60 tablet  11  . metoprolol (LOPRESSOR) 50 MG tablet Take 1 tablet (50 mg total) by mouth 2 (two) times daily.  60 tablet  11  . pravastatin (PRAVACHOL) 40 MG tablet Take 1 tablet (40 mg total) by mouth daily.  30 tablet  11  . DISCONTD: lisinopril (PRINIVIL,ZESTRIL) 5 MG tablet Take 1 tablet (5 mg total) by mouth daily.  30 tablet  6  . DISCONTD: metFORMIN (GLUCOPHAGE) 500 MG tablet Take 1 tablet (500 mg total) by mouth 2 (two) times  daily with a meal.  60 tablet  1  . DISCONTD: metFORMIN (GLUCOPHAGE) 500 MG tablet Take 1 tablet (500 mg total) by mouth 2 (two) times daily with a meal.  60 tablet  11  . DISCONTD: metoprolol (LOPRESSOR) 50 MG tablet Take 1 tablet (50 mg total) by mouth 2 (two) times daily.  60 tablet  6  . DISCONTD: pravastatin (PRAVACHOL) 40 MG tablet Take 1 tablet (40 mg total) by mouth daily.  30 tablet  6  . furosemide (LASIX) 40 MG tablet Take 1 tablet (40 mg total) by mouth daily.  30 tablet  11  . DISCONTD: furosemide (LASIX) 40 MG tablet Take 1 tablet (40 mg total) by mouth daily.  30 tablet  6    No Known Allergies  Family History  Problem Relation Age of Onset  . Cancer Mother     History   Social History  . Marital Status: Single    Spouse Name: N/A    Number of Children: N/A  . Years of Education: N/A   Occupational History  . 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 Smoking status: Current Everyday Smoker -- 1.0 packs/day for 33 years    Types: Cigarettes  . Smokeless tobacco: Never Used  . Alcohol Use: No     QUIT IN 2000  .  Drug Use: No  . Sexually Active: No   Other Topics Concern  . Not on file   Social History Narrative   Lives in [place], [place] with housemate.     ROS ALL NEGATIVE EXCEPT THOSE NOTED IN HPI  PE  General Appearance: well developed, well nourished in no acute distress, morbidly obese HEENT: symmetrical face, PERRLA, poor dentition Neck: no JVD, thyromegaly, or adenopaon thy, trachea midline Chest: symmetric without deformity Cardiac: PMI non-displaced, RRR, normal S1, S2, no gallop or murmur Lung: clear to ausculation and percussion Vascular: all pulses full without bruits  Abdominal: nondistended, nontender, good bowel sounds, no HSM, no bruits Extremities: no cyanosis, clubbing or edema, no sign of DVT, no varicosities  Skin: normal color, no rashes Neuro: alert and oriented x 3, non-focal Pysch: normal affect  EKG Not  repeated BMET    Component Value Date/Time   NA 140 11/20/2011 1320   K 3.7 11/20/2011 1320   CL 104 11/20/2011 1320   CO2 27 11/20/2011 1320   GLUCOSE 256* 11/20/2011 1320   BUN 20 11/20/2011 1320   CREATININE 0.9 11/20/2011 1320   CALCIUM 8.6 11/20/2011 1320   GFRNONAA >90 11/09/2011 0430   GFRAA >90 11/09/2011 0430    Lipid Panel     Component Value Date/Time   CHOL  Value: 173        ATP III CLASSIFICATION:  <200     mg/dL   Desirable  161-096  mg/dL   Borderline High  >=045    mg/dL   High        40/98/1191 0318   TRIG 73 05/22/2010 0318   HDL 42 05/22/2010 0318   CHOLHDL 4.1 05/22/2010 0318   VLDL 15 05/22/2010 0318   LDLCALC  Value: 116        Total Cholesterol/HDL:CHD Risk Coronary Heart Disease Risk Table                     Men   Women  1/2 Average Risk   3.4   3.3  Average Risk       5.0   4.4  2 X Average Risk   9.6   7.1  3 X Average Risk  23.4   11.0        Use the calculated Patient Ratio above and the CHD Risk Table to determine the patient's CHD Risk.        ATP III CLASSIFICATION (LDL):  <100     mg/dL   Optimal  478-295  mg/dL   Near or Above                    Optimal  130-159  mg/dL   Borderline  621-308  mg/dL   High  >657     mg/dL   Very High* 84/69/6295 0318    CBC    Component Value Date/Time   WBC 7.2 11/09/2011 0430   RBC 4.64 11/09/2011 0430   HGB 13.6 11/09/2011 0430   HCT 41.6 11/09/2011 0430   PLT 216 11/09/2011 0430   MCV 89.7 11/09/2011 0430   MCH 29.3 11/09/2011 0430   MCHC 32.7 11/09/2011 0430   RDW 13.4 11/09/2011 0430   LYMPHSABS 2.8 11/07/2011 0545   MONOABS 0.6 11/07/2011 0545   EOSABS 0.1 11/07/2011 0545   BASOSABS 0.0 11/07/2011 0545

## 2012-08-11 ENCOUNTER — Other Ambulatory Visit: Payer: Self-pay | Admitting: Physician Assistant

## 2012-08-11 NOTE — Telephone Encounter (Signed)
New problems:    Need refills on all medication  .    [facility].

## 2012-10-24 ENCOUNTER — Emergency Department (HOSPITAL_COMMUNITY)
Admission: EM | Admit: 2012-10-24 | Discharge: 2012-10-24 | Disposition: A | Discharge status: Home / Self Care | Payer: Self-pay | Attending: Emergency Medicine | Admitting: Emergency Medicine

## 2012-10-24 ENCOUNTER — Encounter (HOSPITAL_COMMUNITY): Payer: Self-pay | Admitting: Emergency Medicine

## 2012-10-24 DIAGNOSIS — Z7982 Long term (current) use of aspirin: Secondary | ICD-10-CM | POA: Insufficient documentation

## 2012-10-24 DIAGNOSIS — Z79899 Other long term (current) drug therapy: Secondary | ICD-10-CM | POA: Insufficient documentation

## 2012-10-24 DIAGNOSIS — Z8679 Personal history of other diseases of the circulatory system: Secondary | ICD-10-CM | POA: Insufficient documentation

## 2012-10-24 DIAGNOSIS — G4733 Obstructive sleep apnea (adult) (pediatric): Secondary | ICD-10-CM | POA: Insufficient documentation

## 2012-10-24 DIAGNOSIS — Z792 Long term (current) use of antibiotics: Secondary | ICD-10-CM | POA: Insufficient documentation

## 2012-10-24 DIAGNOSIS — J4489 Other specified chronic obstructive pulmonary disease: Secondary | ICD-10-CM | POA: Insufficient documentation

## 2012-10-24 DIAGNOSIS — Z872 Personal history of diseases of the skin and subcutaneous tissue: Secondary | ICD-10-CM | POA: Insufficient documentation

## 2012-10-24 DIAGNOSIS — I10 Essential (primary) hypertension: Secondary | ICD-10-CM | POA: Insufficient documentation

## 2012-10-24 DIAGNOSIS — I5040 Unspecified combined systolic (congestive) and diastolic (congestive) heart failure: Secondary | ICD-10-CM | POA: Insufficient documentation

## 2012-10-24 DIAGNOSIS — Z862 Personal history of diseases of the blood and blood-forming organs and certain disorders involving the immune mechanism: Secondary | ICD-10-CM | POA: Insufficient documentation

## 2012-10-24 DIAGNOSIS — F17200 Nicotine dependence, unspecified, uncomplicated: Secondary | ICD-10-CM | POA: Insufficient documentation

## 2012-10-24 DIAGNOSIS — L0231 Cutaneous abscess of buttock: Secondary | ICD-10-CM | POA: Insufficient documentation

## 2012-10-24 DIAGNOSIS — Z8639 Personal history of other endocrine, nutritional and metabolic disease: Secondary | ICD-10-CM | POA: Insufficient documentation

## 2012-10-24 DIAGNOSIS — E119 Type 2 diabetes mellitus without complications: Secondary | ICD-10-CM | POA: Insufficient documentation

## 2012-10-24 DIAGNOSIS — L0291 Cutaneous abscess, unspecified: Secondary | ICD-10-CM

## 2012-10-24 DIAGNOSIS — J449 Chronic obstructive pulmonary disease, unspecified: Secondary | ICD-10-CM | POA: Insufficient documentation

## 2012-10-24 MED ORDER — CEPHALEXIN 500 MG PO CAPS: 500.0000 mg | ORAL_CAPSULE | Freq: Four times a day (QID) | ORAL | Status: DC

## 2012-10-24 NOTE — ED Notes (Signed)
PA at bedside to assess pt.

## 2012-10-24 NOTE — ED Provider Notes (Signed)
History  This chart was scribed for non-physician practitioner working with David Bright David Knoche PA-C/David Bright Amass, MD, by Candelaria Stagers, ED Scribe. This patient was seen in room TR06C/TR06C and the patient's care was started at 4:02 PM   CSN: [id number]  Arrival date & time 10/24/12  1337   First MD Initiated Contact with Patient 10/24/12 1558      Chief Complaint  Patient presents with  . Recurrent Skin Infections    right buttock     The history is provided by the patient. No language interpreter was used.   David Bright is a 52 y.o. male who presents to the Emergency Department complaining of a boil to the right buttock that started three weeks ago and has gradually worsened.  Pt reports h/o a boil to the same area several years ago that he was able to manage at home.  Pt reports drainage from the boil over the last two weeks.  Pt has h/o diabetes, HTN, and CHF.  He has washed the area and applied gauze.  Walking around makes the pain to the area worse.  Pt denies fever, chills, nausea, or vomiting.      Past Medical History  Diagnosis Date  . CHF (congestive heart failure)     Systolic and diastolic  . COPD (chronic obstructive pulmonary disease)   . Diabetes mellitus   . Hypertension   . Sleep apnea     Nocturnal CPAP  . Hyperlipidemia   . Non compliance with medical treatment   . Polysubstance abuse     History of quitting alcohol, marijuana and crack over 12 years ago  . Non-ischemic cardiomyopathy     Normal cath 2002  . NICM (nonischemic cardiomyopathy) 11/09/2011  . OSA on CPAP 11/09/2011  . HTN (hypertension) 11/09/2011  . DM (diabetes mellitus) 11/09/2011    Past Surgical History  Procedure Laterality Date  . None      Family History  Problem Relation Age of Onset  . Cancer Mother     History  Substance Use Topics  . Smoking status: Current Every Day Smoker -- 1.00 packs/day for 33 years    Types: Cigarettes  . Smokeless tobacco: Never Used  .  Alcohol Use: No     Comment: QUIT IN 2000      Review of Systems  Constitutional: Negative for fever and chills.  Gastrointestinal: Negative for nausea and vomiting.  Skin: Positive for wound (abscess to right buttock).  All other systems reviewed and are negative.    Allergies  Review of patient's allergies indicates no known allergies.  Home Medications   Current Outpatient Rx  Name  Route  Sig  Dispense  Refill  . aspirin 325 MG tablet   Oral   Take 325 mg by mouth daily.         . furosemide (LASIX) 40 MG tablet   Oral   Take 40 mg by mouth daily.         Marland Kitchen lisinopril (PRINIVIL,ZESTRIL) 5 MG tablet   Oral   Take 5 mg by mouth daily.         . metFORMIN (GLUCOPHAGE) 500 MG tablet   Oral   Take 500 mg by mouth 2 (two) times daily with a meal.         . metoprolol (LOPRESSOR) 50 MG tablet   Oral   Take 50 mg by mouth 2 (two) times daily.         . cephALEXin (KEFLEX) 500  MG capsule   Oral   Take 1 capsule (500 mg total) by mouth 4 (four) times daily.   40 capsule   0     BP 151/70  Pulse 76  Temp(Src) 98 F (36.7 C) (Oral)  Resp 20  SpO2 96%  Physical Exam  Nursing note and vitals reviewed. Constitutional: He is oriented to person, place, and time. He appears well-developed and well-nourished. No distress.  HENT:  Head: Normocephalic and atraumatic.  Eyes: Conjunctivae are normal. No scleral icterus.  Neck: Normal range of motion.  Cardiovascular: Normal rate, regular rhythm and intact distal pulses.   Pulmonary/Chest: Effort normal and breath sounds normal.  Abdominal: Soft. He exhibits no distension. There is no tenderness.  Musculoskeletal:  5 cm x 5 cm abscess to the right buttock which is erythematous and tender to palpation.  3 cm x 3 cm fluctuant in the middle.    Lymphadenopathy:    He has no cervical adenopathy.  Neurological: He is alert and oriented to person, place, and time.  Skin: Skin is warm and dry. He is not  diaphoretic. There is erythema.  Psychiatric: He has a normal mood and affect.    ED Course  Procedures   DIAGNOSTIC STUDIES: Oxygen Saturation is 96% on room air, normal by my interpretation.    COORDINATION OF CARE: 4:00 PM Discussed course of care with pt which includes I&D of abscess and prescribed antibiotic.  Pt understands and agrees.   INCISION AND DRAINAGE PROCEDURE NOTE: Patient identification was confirmed and verbal consent was obtained. This procedure was performed by Dierdre Forth, PA-C at 4:48 PM. Site: right buttocks Sterile procedures observed Anesthetic used (type and amt): 2% lidocaine with epi, 5.5 cc used Blade size: 11 Drainage: scant Complexity: Complex Site anesthetized, incision made over site, wound drained and explored loculations, rinsed with copious amounts of normal saline, covered with dry, sterile dressing.  Pt tolerated procedure well without complications.  Instructions for care discussed verbally and pt provided with additional written instructions for homecare and f/u.    Labs Reviewed - No data to display No results found.   1. Abscess       MDM  David Bright presents with abscess.  Patient with skin abscess amenable to incision and drainage.  Abscess was not large enough to warrant packing or drain,  wound recheck in 2 days. Encouraged home warm soaks and flushing.  Mild signs of cellulitis is surrounding skin.  Will d/c to home.  Will give Abx as this is an area that is difficult to keep clean.    1. Medications: keflex, usual home medications 2. Treatment: rest, drink plenty of fluids, warm soaks or hot compresses 3x per day 3. Follow Up: Please followup with your primary doctor for discussion of your diagnoses and further evaluation after today's visit; if you do not have a primary care doctor use the resource guide provided to find one;   I personally performed the services described in this documentation, which was  scribed in my presence. The recorded information has been reviewed and is accurate.       David Bright Tico Crotteau, PA-C 10/24/12 1655

## 2012-10-24 NOTE — ED Notes (Signed)
I&D kit, lidocaine 2% placed at bedside.

## 2012-10-24 NOTE — ED Notes (Signed)
Pt reports boil to right buttock area onset 3 weeks ago. Pt reports area partially draining.

## 2012-10-24 NOTE — ED Notes (Signed)
Rx x 1.  Pt voiced understanding to f/u with PCP and return for worsening condition.  

## 2012-10-26 NOTE — ED Provider Notes (Signed)
Medical screening examination/treatment/procedure(s) were performed by non-physician practitioner and as supervising physician I was immediately available for consultation/collaboration.    Vida Roller, MD 10/26/12 [phone]

## 2012-12-09 ENCOUNTER — Encounter: Payer: Self-pay | Admitting: Physician Assistant

## 2012-12-09 ENCOUNTER — Ambulatory Visit (INDEPENDENT_AMBULATORY_CARE_PROVIDER_SITE_OTHER): Payer: Self-pay | Admitting: Physician Assistant

## 2012-12-09 ENCOUNTER — Ambulatory Visit: Payer: Self-pay | Admitting: Physician Assistant

## 2012-12-09 VITALS — BP 156/84 | HR 90 | Ht 73.0 in | Wt 284.6 lb

## 2012-12-09 DIAGNOSIS — I428 Other cardiomyopathies: Secondary | ICD-10-CM

## 2012-12-09 DIAGNOSIS — I10 Essential (primary) hypertension: Secondary | ICD-10-CM

## 2012-12-09 NOTE — Patient Instructions (Signed)
Your physician recommends that you schedule a follow-up appointment in: 6 mth with Dr Daleen Squibb

## 2012-12-09 NOTE — Assessment & Plan Note (Signed)
Patient's blood pressure isn't controlled restart taking his medications as he should. We have instructed him to go to the [facility] health clinic to assist with medications and apply for Medicaid.

## 2012-12-09 NOTE — Progress Notes (Signed)
HPI:   This is a 52 year old African American male patient who comes in for yearly followup. He has a history of nonischemic cardiomyopathy, hypertensive crisis and pulmonary edema. He also has obstructive sleep apnea, as well as diabetes mellitus. Currently he has no insurance and is working very little maybe on weekends. 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 He frequently buys his medicines over cigarettes. He says he takes his Lopressor once daily rather than b.i.d. So he can stretch it out longer. He takes them as much as he can but knows he has missed many doses. He has been crying a lot of foods recently and knows this is a healthy. He tries to watch his salt intake.  No Known Allergies  Current Outpatient Prescriptions on File Prior to Visit: furosemide (LASIX) 40 MG tablet, Take 40 mg by mouth daily., Disp: , Rfl:  lisinopril (PRINIVIL,ZESTRIL) 5 MG tablet, Take 5 mg by mouth daily., Disp: , Rfl:  metFORMIN (GLUCOPHAGE) 500 MG tablet, Take 500 mg by mouth daily. , Disp: , Rfl:  metoprolol (LOPRESSOR) 50 MG tablet, Take 50 mg by mouth daily. , Disp: , Rfl:   No current facility-administered medications on file prior to visit.   Past Medical History:   CHF (congestive heart failure)                                 Comment:Systolic and diastolic   COPD (chronic obstructive pulmonary disease)                 Diabetes mellitus                                            Hypertension                                                 Sleep apnea                                                    Comment:Nocturnal CPAP   Hyperlipidemia                                               Non compliance with medical treatment                        Polysubstance abuse                                            Comment:History of quitting alcohol, marijuana and               crack over 12 years ago   Non-ischemic cardiomyopathy  Comment:Normal cath 2002   NICM (nonischemic cardiomyopathy)               11/09/2011    OSA on CPAP                                     11/09/2011    HTN (hypertension)                              11/09/2011    DM (diabetes mellitus)                          11/09/2011   Past Surgical History:   none                                                         Review of patient's family history indicates:   Cancer                         Mother                   Social History   Marital Status: Single              Spouse Name:                      Years of Education:                 Number of children:             Occupational History Occupation          Engineer, manufacturing systems for [employer]*                       Social History Main Topics   Smoking Status: Current Every Day Smoker        Packs/Day: 1.00  Years: 35        Types: Cigarettes   Smokeless Status: Never Used                       Alcohol Use: No                Comment: QUIT IN 2000   Drug Use: No             Sexual Activity: No                 Other Topics            Concern   None on file  Social History Narrative   Lives in [place], [place] with housemate.     ROS:see history of present illness otherwise negative   PHYSICAL EXAM: Obese, in no acute distress. Neck: No JVD, HJR, Bruit, or thyroid enlargement  Lungs: No tachypnea, clear without wheezing, rales, or rhonchi  Cardiovascular: RRR, PMI not displaced, positive S4 with 1/6 systolic murmur at the left sternal border and apex, no bruit, thrill,  or heave.  Abdomen: BS normal. Soft without organomegaly, masses, lesions or tenderness.  Extremities: without cyanosis, clubbing or edema. Good distal pulses bilateral  SKin: Warm, no lesions or rashes   Musculoskeletal: No deformities  Neuro: no focal signs  BP 156/84  Pulse 90  Ht 6\' 1"  (1.854 m)  Wt 284 lb 9.6 oz (129.094 kg)  BMI 37.56  kg/m2      EKG normal sinus rhythm with incomplete left bundle branch block nonspecific ST-T wave changes

## 2012-12-09 NOTE — Assessment & Plan Note (Addendum)
Stable without heart failure 

## 2012-12-19 ENCOUNTER — Other Ambulatory Visit: Payer: Self-pay | Admitting: Physician Assistant

## 2013-01-26 ENCOUNTER — Telehealth: Payer: Self-pay | Admitting: Cardiology

## 2013-01-26 ENCOUNTER — Telehealth: Payer: Self-pay | Admitting: *Deleted

## 2013-01-26 NOTE — Telephone Encounter (Signed)
Why did he wait so long to call?? OK to take generic Lipitor.

## 2013-01-26 NOTE — Telephone Encounter (Signed)
error 

## 2013-01-26 NOTE — Telephone Encounter (Signed)
Received call from pt regarding Pravastatin. He was taking this until price went up and he is now unable to afford. Has not taken for last 2-3 months.  He asked me to check $4 list at [facility] for cheaper alternative. List checked and only cholesterol medication on list is Lovastatin 10mg  and 20 mg. Pt is asking if Dr. Daleen Squibb would consider changing him to this medication.

## 2013-01-26 NOTE — Telephone Encounter (Signed)
He is requesting a medication that is cheaper than Pravastatin. Only $4 alternative on [facility] list is Lovastatin 10 mg or 20 mg. Atorvastatin is not on $4 list.

## 2013-01-27 MED ORDER — LOVASTATIN 20 MG PO TABS: 40.0000 mg | ORAL_TABLET | Freq: Every day | ORAL | Status: DC

## 2013-01-27 NOTE — Telephone Encounter (Signed)
His medication list does not show pravastatin on his last visit. We need to know the dose that he is taking and then we can decide on the dose of lovastatin. Please consult with Kennon Rounds on this.

## 2013-01-27 NOTE — Telephone Encounter (Signed)
Reviewing chart pt was taking Pravastatin 40mg  qhs. Kennon Rounds reviewed & pt will need to take Lovastatin 40mg  daily This will call pt $8.00  Prescription sent in & pt is aware he will need to take 2 lovastatin 20mg  at bedtime & this will cost $8.00  Pt is agreeable with this.  Mylo Red RN

## 2013-05-14 ENCOUNTER — Other Ambulatory Visit: Payer: Self-pay | Admitting: Cardiology

## 2013-05-17 ENCOUNTER — Other Ambulatory Visit: Payer: Self-pay

## 2013-05-17 MED ORDER — FUROSEMIDE 40 MG PO TABS: 40.0000 mg | ORAL_TABLET | Freq: Every day | ORAL | Status: DC

## 2013-05-17 MED ORDER — LISINOPRIL 5 MG PO TABS: 5.0000 mg | ORAL_TABLET | Freq: Every day | ORAL | Status: DC

## 2013-08-18 ENCOUNTER — Other Ambulatory Visit: Payer: Self-pay | Admitting: Cardiology

## 2013-09-30 ENCOUNTER — Other Ambulatory Visit: Payer: Self-pay | Admitting: *Deleted

## 2013-10-01 ENCOUNTER — Ambulatory Visit: Payer: Self-pay | Admitting: Cardiology

## 2013-10-02 ENCOUNTER — Emergency Department (HOSPITAL_COMMUNITY)
Admission: EM | Admit: 2013-10-02 | Discharge: 2013-10-02 | Disposition: A | Discharge status: Home / Self Care | Payer: Self-pay | Attending: Emergency Medicine | Admitting: Emergency Medicine

## 2013-10-02 ENCOUNTER — Encounter (HOSPITAL_COMMUNITY): Payer: Self-pay | Admitting: Emergency Medicine

## 2013-10-02 DIAGNOSIS — E785 Hyperlipidemia, unspecified: Secondary | ICD-10-CM | POA: Insufficient documentation

## 2013-10-02 DIAGNOSIS — Z79899 Other long term (current) drug therapy: Secondary | ICD-10-CM | POA: Insufficient documentation

## 2013-10-02 DIAGNOSIS — I10 Essential (primary) hypertension: Secondary | ICD-10-CM | POA: Insufficient documentation

## 2013-10-02 DIAGNOSIS — R109 Unspecified abdominal pain: Secondary | ICD-10-CM | POA: Insufficient documentation

## 2013-10-02 DIAGNOSIS — F17200 Nicotine dependence, unspecified, uncomplicated: Secondary | ICD-10-CM | POA: Insufficient documentation

## 2013-10-02 DIAGNOSIS — G4733 Obstructive sleep apnea (adult) (pediatric): Secondary | ICD-10-CM | POA: Insufficient documentation

## 2013-10-02 DIAGNOSIS — I509 Heart failure, unspecified: Secondary | ICD-10-CM | POA: Insufficient documentation

## 2013-10-02 DIAGNOSIS — Z91199 Patient's noncompliance with other medical treatment and regimen due to unspecified reason: Secondary | ICD-10-CM | POA: Insufficient documentation

## 2013-10-02 DIAGNOSIS — Z9119 Patient's noncompliance with other medical treatment and regimen: Secondary | ICD-10-CM | POA: Insufficient documentation

## 2013-10-02 DIAGNOSIS — E119 Type 2 diabetes mellitus without complications: Secondary | ICD-10-CM | POA: Insufficient documentation

## 2013-10-02 DIAGNOSIS — J449 Chronic obstructive pulmonary disease, unspecified: Secondary | ICD-10-CM | POA: Insufficient documentation

## 2013-10-02 DIAGNOSIS — J4489 Other specified chronic obstructive pulmonary disease: Secondary | ICD-10-CM | POA: Insufficient documentation

## 2013-10-02 MED ORDER — HYDROCODONE-ACETAMINOPHEN 5-325 MG PO TABS: 1.0000 | ORAL_TABLET | ORAL | Status: DC | PRN

## 2013-10-02 MED ORDER — IBUPROFEN 800 MG PO TABS
800.0000 mg | ORAL_TABLET | Freq: Once | ORAL | Status: AC
  Administered 2013-10-02: 800 mg via ORAL
  Filled 2013-10-02: qty 1

## 2013-10-02 MED ORDER — HYDROCODONE-ACETAMINOPHEN 5-325 MG PO TABS
1.0000 | ORAL_TABLET | Freq: Once | ORAL | Status: DC
  Filled 2013-10-02: qty 1

## 2013-10-02 NOTE — ED Notes (Signed)
The ptin is worse with movement

## 2013-10-02 NOTE — ED Notes (Signed)
PT reports right sided rib pain. 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

## 2013-10-02 NOTE — ED Provider Notes (Signed)
CSN: [id number]     Arrival date & time 10/02/13  0219 History   First MD Initiated Contact with Patient 10/02/13 0224     Chief Complaint  Patient presents with  . 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 Pain with rom and palpation right lower ribs.  No new sob, clarified nursing note. No chest pain.  Pt on CHF meds, taking as directed, has fup with cardiology soon. No other injuries.  The history is provided by the patient.    Past Medical History  Diagnosis Date  . CHF (congestive heart failure)     Systolic and diastolic  . COPD (chronic obstructive pulmonary disease)   . Diabetes mellitus   . Hypertension   . Sleep apnea     Nocturnal CPAP  . Hyperlipidemia   . Non compliance with medical treatment   . Polysubstance abuse     History of quitting alcohol, marijuana and crack over 12 years ago  . Non-ischemic cardiomyopathy     Normal cath 2002  . NICM (nonischemic cardiomyopathy) 11/09/2011  . OSA on CPAP 11/09/2011  . HTN (hypertension) 11/09/2011  . DM (diabetes mellitus) 11/09/2011   Past Surgical History  Procedure Laterality Date  . None     Family History  Problem Relation Age of Onset  . Cancer Mother    History  Substance Use Topics  . Smoking status: Current Every Day Smoker -- 1.00 packs/day for 33 years    Types: Cigarettes  . Smokeless tobacco: Never Used  . Alcohol Use: No     Comment: QUIT IN 2000    Review of Systems  Constitutional: Negative for fever and chills.  Respiratory: Negative for shortness of breath.   Cardiovascular: Negative for chest pain.  Gastrointestinal: Negative for vomiting and abdominal pain.  Genitourinary: Positive for flank pain. Negative for dysuria.  Musculoskeletal:  Negative for back pain, neck pain and neck stiffness.  Skin: Negative for rash.  Neurological: Negative for light-headedness and headaches.      Allergies  Review of patient's allergies indicates no known allergies.  Home Medications   Current Outpatient Rx  Name  Route  Sig  Dispense  Refill  . furosemide (LASIX) 40 MG tablet   Oral   Take 1 tablet (40 mg total) by mouth daily.   30 tablet   6   . HYDROcodone-acetaminophen (NORCO) 5-325 MG per tablet   Oral   Take 1-2 tablets by mouth every 4 (four) hours as needed.   6 tablet   0   . lisinopril (PRINIVIL,ZESTRIL) 5 MG tablet   Oral   Take 1 tablet (5 mg total) by mouth daily.   30 tablet   6   . lovastatin (MEVACOR) 20 MG tablet   Oral   Take 2 tablets (40 mg total) by mouth at bedtime.   60 tablet   11   . metFORMIN (GLUCOPHAGE) 500 MG tablet   Oral   Take 500 mg by mouth daily.          . metoprolol (LOPRESSOR) 50 MG tablet      TAKE ONE TABLET BY MOUTH TWICE DAILY   60 tablet   0     PATIENT NEEDS APPOINTMENT    BP 156/69  Pulse 92  Temp(Src) 98.1 F (  36.7 C) (Oral)  Resp 18  Ht 6' (1.829 m)  Wt 293 lb (132.904 kg)  BMI 39.73 kg/m2  SpO2 99% Physical Exam  Nursing note and vitals reviewed. Constitutional: He is oriented to person, place, and time. He appears well-developed and well-nourished.  HENT:  Head: Normocephalic and atraumatic.  Eyes: Conjunctivae are normal. Right eye exhibits no discharge. Left eye exhibits no discharge.  Neck: Normal range of motion. Neck supple. No tracheal deviation present.  Cardiovascular: Normal rate and regular rhythm.   Pulmonary/Chest: Effort normal and breath sounds normal.  Abdominal: Soft. He exhibits no distension. There is no tenderness. There is no guarding.  Musculoskeletal: He exhibits tenderness (right lateral lower ribs, no guarding, no step off). He exhibits no edema.  Neurological: He is alert and oriented to person, place, and time.  Skin:  Skin is warm. No rash noted.  Psychiatric: He has a normal mood and affect.    ED Course  Procedures (including critical care time) Labs Review Labs Reviewed - No data to display Imaging Review No results found.  EKG Interpretation    Date/Time:  Saturday October 02 2013 02:27:30 EST Ventricular Rate:  94 PR Interval:  174 QRS Duration: 118 QT Interval:  404 QTC Calculation: 505 R Axis:   130 Text Interpretation:  Normal sinus rhythm Biatrial enlargement Right axis deviation Incomplete left bundle branch block Nonspecific ST abnormality Prolonged QT Similar to previous Confirmed by Armin Yerger  MD, Segundo Makela ([id number]) on 10/02/2013 3:14:04 AM            MDM   Final diagnoses:  Right flank pain  HTN  Well appearing. MSK injury.  Considered cardiac/ kidney stone/ other emergent issues however HPI/ PE MSK.  Discussed CXR pt okay with holding as he is concerned for cost. He has outpt fup for heart/ htn. EKG similar to previous.  Filed Vitals:   10/02/13 0230 10/02/13 0238  BP: 191/89 156/69  Pulse: 98 92  Temp: 98.3 F (36.8 C) 98.1 F (36.7 C)  TempSrc: Oral Oral  Resp: 18 18  Height: 6' (1.829 m)   Weight: 293 lb (132.904 kg)   SpO2: 91% 99%    Results and differential diagnosis were discussed with the patient. Close follow up outpatient was discussed, patient comfortable with the plan.        Mariea Clonts, MD 10/02/13 [phone]

## 2013-10-02 NOTE — ED Notes (Signed)
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  Some sob

## 2013-10-02 NOTE — Discharge Instructions (Signed)
Take ibuprofen and use ice pack for pain.  Follow up with your heart doctor as we discussed. If you were given medicines take as directed.  If you are on coumadin or contraceptives realize their levels and effectiveness is altered by many different medicines.  If you have any reaction (rash, tongues swelling, other) to the medicines stop taking and see a physician.   Please follow up as directed and return to the ER or see a physician for new or worsening symptoms.  Thank you.

## 2013-10-19 ENCOUNTER — Encounter: Payer: Self-pay | Admitting: Cardiology

## 2013-10-19 ENCOUNTER — Ambulatory Visit (INDEPENDENT_AMBULATORY_CARE_PROVIDER_SITE_OTHER): Payer: Self-pay | Admitting: Cardiology

## 2013-10-19 VITALS — BP 138/80 | HR 60 | Ht 72.0 in | Wt 286.0 lb

## 2013-10-19 DIAGNOSIS — I10 Essential (primary) hypertension: Secondary | ICD-10-CM

## 2013-10-19 DIAGNOSIS — I509 Heart failure, unspecified: Secondary | ICD-10-CM

## 2013-10-19 DIAGNOSIS — I5042 Chronic combined systolic (congestive) and diastolic (congestive) heart failure: Secondary | ICD-10-CM

## 2013-10-19 LAB — BASIC METABOLIC PANEL
BUN: 18 mg/dL (ref 6–23)
CO2: 28 mEq/L (ref 19–32)
Calcium: 9.2 mg/dL (ref 8.4–10.5)
Chloride: 102 mEq/L (ref 96–112)
Creatinine, Ser: 0.8 mg/dL (ref 0.4–1.5)
GFR: 104.06 mL/min (ref >60.00)
Glucose, Bld: 318 mg/dL — ABNORMAL HIGH (ref 70–99)
Potassium: 3.9 mEq/L (ref 3.5–5.1)
Sodium: 137 mEq/L (ref 135–145)

## 2013-10-19 MED ORDER — FUROSEMIDE 40 MG PO TABS: 40.0000 mg | ORAL_TABLET | Freq: Every day | ORAL | Status: DC

## 2013-10-19 MED ORDER — METFORMIN HCL 500 MG PO TABS: 500.0000 mg | ORAL_TABLET | Freq: Two times a day (BID) | ORAL | Status: DC

## 2013-10-19 MED ORDER — ASPIRIN EC 81 MG PO TBEC: 81.0000 mg | DELAYED_RELEASE_TABLET | Freq: Every day | ORAL | Status: AC

## 2013-10-19 MED ORDER — LISINOPRIL 5 MG PO TABS: 5.0000 mg | ORAL_TABLET | Freq: Every day | ORAL | Status: DC

## 2013-10-19 MED ORDER — METOPROLOL TARTRATE 50 MG PO TABS: 50.0000 mg | ORAL_TABLET | Freq: Every day | ORAL | Status: DC

## 2013-10-19 NOTE — Patient Instructions (Signed)
Your physician has recommended you make the following change in your medication: decrease Aspirin to 81 mg daily  Your physician recommends that you return for lab work in: today  Your physician wants you to follow-up in: 1 year. You will receive a reminder letter in the mail two months in advance. If you don't receive a letter, please call our office to schedule the follow-up appointment.

## 2013-10-19 NOTE — Progress Notes (Signed)
Patient ID: CLEBURNE MUSTO, male   DOB: 08/28/1960, 53 y.o.   MRN: [id number]     Patient Name: David Bright Date of Encounter: 10/19/2013  Primary Care Provider:  No PCP Per Patient Primary Cardiologist:  Dorothy Spark (previously Dr Verl Blalock)  Problem List   Past Medical History  Diagnosis Date  . CHF (congestive heart failure)     Systolic and diastolic  . COPD (chronic obstructive pulmonary disease)   . Diabetes mellitus   . Hypertension   . Sleep apnea     Nocturnal CPAP  . Hyperlipidemia   . Non compliance with medical treatment   . Polysubstance abuse     History of quitting alcohol, marijuana and crack over 12 years ago  . Non-ischemic cardiomyopathy     Normal cath 2002  . NICM (nonischemic cardiomyopathy) 11/09/2011  . OSA on CPAP 11/09/2011  . HTN (hypertension) 11/09/2011  . DM (diabetes mellitus) 11/09/2011   Past Surgical History  Procedure Laterality Date  . None      Allergies  No Known Allergies  HPI  A 53 year old male with h/o hypertension, NIDDM, OSA, hyperlipidemia and combined systolic and diastolic CHF. He has h/o non-ischemic cardiomyopathy. The patient hasn't followed in the last two years as he doesn't have medical insurance and cannot afford doctor's visits. As a consequence he ran out of some medications refill such as metformin, he doesn't have PCP. His last echocardiogram in 2011 showed severely dilated left ventricle with LVEF 25-30% and grade 2 diastolic dysfunction with elevated filling pressures. Normal RVSP.  He states that he has been doing fairly well, he gets on and off LE dema, but taking extra lasix takes care of it. He denies any cest pain, his DOE has been stable, NYHA II. He denies palpitations, syncope, PND or orthopnea.   His concern is lower right chest wall pain that started when he was carrying a heavy object and has persisted for the last 3 weeks, especially with position change or laying on the right site.   Home  Medications  Prior to Admission medications   Medication Sig Start Date End Date Taking? Authorizing Provider  aspirin EC 325 MG tablet Take 325 mg by mouth daily.   Yes Historical Provider, MD  furosemide (LASIX) 40 MG tablet Take 1 tablet (40 mg total) by mouth daily. 05/17/13  Yes Dorothy Spark, MD  lisinopril (PRINIVIL,ZESTRIL) 5 MG tablet Take 1 tablet (5 mg total) by mouth daily. 05/17/13  Yes Dorothy Spark, MD  metoprolol (LOPRESSOR) 50 MG tablet Take 50 mg by mouth daily.   Yes Historical Provider, MD    Family History  Family History  Problem Relation Age of Onset  . Cancer Mother     Social History  History   Social History  . Marital Status: Single    Spouse Name: N/A    Number of Children: N/A  . 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 Smoking status: Current Every Day Smoker -- 1.00 packs/day for 33 years    Types: Cigarettes  . Smokeless tobacco: Never Used  . Alcohol Use: No     Comment: QUIT IN 2000  . Drug Use: No  . Sexual Activity: No   Other Topics Concern  . Not on file   Social History Narrative   Lives in [place], [place] with housemate.  Review of Systems, as per HPI, otherwise negative General:  No chills, fever, night sweats or weight changes.  Cardiovascular:  No chest pain, dyspnea on exertion, edema, orthopnea, palpitations, paroxysmal nocturnal dyspnea. Dermatological: No rash, lesions/masses Respiratory: No cough, dyspnea Urologic: No hematuria, dysuria Abdominal:   No nausea, vomiting, diarrhea, bright red blood per rectum, melena, or hematemesis Neurologic:  No visual changes, wkns, changes in mental status. All other systems reviewed and are otherwise negative except as noted above.  Physical Exam  Blood pressure 138/80, pulse 60, height 6' (1.829 m), weight 286 lb (129.729 kg).  General: Pleasant, NAD Psych: Normal affect. Neuro: Alert and  oriented X 3. Moves all extremities spontaneously. HEENT: Normal  Neck: Supple without bruits or JVD. Lungs:  Resp regular and unlabored, CTA. Heart: RRR no s3, s4, or murmurs. Abdomen: Soft, non-tender, non-distended, BS + x 4.  Extremities: No clubbing, cyanosis or edema. DP/PT/Radials 2+ and equal bilaterally.  Labs:  No results found for this basename: CKTOTAL, CKMB, TROPONINI,  in the last 72 hours Lab Results  Component Value Date   WBC 7.2 11/09/2011   HGB 13.6 11/09/2011   HCT 41.6 11/09/2011   MCV 89.7 11/09/2011   PLT 216 11/09/2011   No results found for this basename: NA, K, CL, CO2, BUN, CREATININE, CALCIUM, LABALBU, PROT, BILITOT, ALKPHOS, ALT, AST, GLUCOSE,  in the last 168 hours Lab Results  Component Value Date   CHOL  Value: 173        ATP III CLASSIFICATION:  <200     mg/dL   Desirable  200-239  mg/dL   Borderline High  >=240    mg/dL   High        05/22/2010   HDL 42 05/22/2010   LDLCALC  Value: 116        Total Cholesterol/HDL:CHD Risk Coronary Heart Disease Risk Table                     Men   Women  1/2 Average Risk   3.4   3.3  Average Risk       5.0   4.4  2 X Average Risk   9.6   7.1  3 X Average Risk  23.4   11.0        Use the calculated Patient Ratio above and the CHD Risk Table to determine the patient's CHD Risk.        ATP III CLASSIFICATION (LDL):  <100     mg/dL   Optimal  100-129  mg/dL   Near or Above                    Optimal  130-159  mg/dL   Borderline  160-189  mg/dL   High  >190     mg/dL   Very High* 05/22/2010   TRIG 73 05/22/2010   No results found for this basename: DDIMER   No components found with this basename: POCBNP,   Accessory Clinical Findings  Echocardiogram - 05/21/2010  - Left ventricle: The cavity size was severely dilated. Wall thickness was increased in a pattern of mild LVH. Systolic function was severely reduced. The estimated ejection fraction was in the range of 25% to 30%. Diffuse hypokinesis. Features are consistent  with a pseudonormal left ventricular filling pattern, with concomitant abnormal relaxation and increased filling pressure (grade 2 diastolic dysfunction). - Aortic valve: Trivial regurgitation. - Mitral valve: Mild regurgitation. - Left atrium: The atrium was moderately  dilated. - Pericardium, extracardiac: A trivial pericardial effusion was identified. There was a left pleural effusion.  ECG - SR, RAD, bi-atrial enlargement, non-specific ST-T wave abnormalities   Assessment & Plan  53 year old male   1. Combined chronic systolic and diastolic CHF - the patient is euvolemic, complaint to his meds, good fluid management with lasix.   2. Non-ischemic CMP - LVEF 25-30%, severely dilated LV, continue lisinopril, metoprolol, aspirin and lasix.  He is euvolemic, it would be helpful to reapeat his echo, but he can't afford it. We will check BMP today.   3. NIDDM - we will refill metformin 500 mg po bid  Follow up in 1 year.    Dorothy Spark, MD, [facility] 10/19/2013, 3:15 PM

## 2013-10-21 ENCOUNTER — Telehealth: Payer: Self-pay | Admitting: Cardiology

## 2013-10-21 NOTE — Telephone Encounter (Signed)
Message copied by Earvin Hansen on Thu Oct 21, 2013  2:17 PM ------      Message from: Dorothy Spark      Created: Tue Oct 19, 2013  6:29 PM       Normal labs, just high glucose, the metformin that we prescribed should help. Would you call him?      Thank you      Houston Siren ------

## 2013-10-21 NOTE — Telephone Encounter (Signed)
Advised patient of lab results. Patient has not picked up Metformin yet secondary to cost. Patient states he will get this week.   Patient still having right sided pain and no PCP. Pain does seem to be getting some better but concerned about it. Stated he discussed with Dr [name] at visit. Will forward to Wonda Horner LPN and Dr [name] for review

## 2013-10-21 NOTE — Telephone Encounter (Signed)
Follow Up    Pt returning call from a Triage nurse. Please call back.

## 2013-10-28 NOTE — Telephone Encounter (Signed)
David Bright, His pain was very atypical and sounded musculoskeletal. He should try taking Ibuprofen for it. Would you call  Him? Thank you, Houston Siren

## 2013-11-01 NOTE — Telephone Encounter (Signed)
The pt is advised, he verbalized understanding.  

## 2014-02-10 ENCOUNTER — Emergency Department (HOSPITAL_COMMUNITY)
Admission: EM | Admit: 2014-02-10 | Discharge: 2014-02-10 | Disposition: A | Discharge status: Home / Self Care | Payer: Self-pay | Attending: Emergency Medicine | Admitting: Emergency Medicine

## 2014-02-10 ENCOUNTER — Encounter (HOSPITAL_COMMUNITY): Payer: Self-pay | Admitting: Emergency Medicine

## 2014-02-10 ENCOUNTER — Emergency Department (HOSPITAL_COMMUNITY): Payer: Self-pay

## 2014-02-10 DIAGNOSIS — J811 Chronic pulmonary edema: Secondary | ICD-10-CM | POA: Insufficient documentation

## 2014-02-10 DIAGNOSIS — J449 Chronic obstructive pulmonary disease, unspecified: Secondary | ICD-10-CM | POA: Insufficient documentation

## 2014-02-10 DIAGNOSIS — J4489 Other specified chronic obstructive pulmonary disease: Secondary | ICD-10-CM | POA: Insufficient documentation

## 2014-02-10 DIAGNOSIS — I5043 Acute on chronic combined systolic (congestive) and diastolic (congestive) heart failure: Secondary | ICD-10-CM | POA: Insufficient documentation

## 2014-02-10 DIAGNOSIS — E119 Type 2 diabetes mellitus without complications: Secondary | ICD-10-CM | POA: Insufficient documentation

## 2014-02-10 DIAGNOSIS — G4733 Obstructive sleep apnea (adult) (pediatric): Secondary | ICD-10-CM | POA: Insufficient documentation

## 2014-02-10 DIAGNOSIS — E785 Hyperlipidemia, unspecified: Secondary | ICD-10-CM | POA: Insufficient documentation

## 2014-02-10 DIAGNOSIS — I10 Essential (primary) hypertension: Secondary | ICD-10-CM | POA: Insufficient documentation

## 2014-02-10 DIAGNOSIS — I509 Heart failure, unspecified: Secondary | ICD-10-CM | POA: Insufficient documentation

## 2014-02-10 DIAGNOSIS — I428 Other cardiomyopathies: Secondary | ICD-10-CM | POA: Insufficient documentation

## 2014-02-10 DIAGNOSIS — F17200 Nicotine dependence, unspecified, uncomplicated: Secondary | ICD-10-CM | POA: Insufficient documentation

## 2014-02-10 LAB — BASIC METABOLIC PANEL
ANION GAP: 15 (ref 5–15)
BUN: 15 mg/dL (ref 6–23)
CALCIUM: 8.6 mg/dL (ref 8.4–10.5)
CO2: 23 mEq/L (ref 19–32)
CREATININE: 0.82 mg/dL (ref 0.50–1.35)
Chloride: 97 mEq/L (ref 96–112)
GFR calc non Af Amer: >90 mL/min (ref >90)
Glucose, Bld: 317 mg/dL — ABNORMAL HIGH (ref 70–99)
Potassium: 4 mEq/L (ref 3.7–5.3)
Sodium: 135 mEq/L — ABNORMAL LOW (ref 137–147)

## 2014-02-10 LAB — CBC
HCT: 45.5 % (ref 39.0–52.0)
Hemoglobin: 15.5 g/dL (ref 13.0–17.0)
MCH: 29.6 pg (ref 26.0–34.0)
MCHC: 34.1 g/dL (ref 30.0–36.0)
MCV: 87 fL (ref 78.0–100.0)
PLATELETS: 210 10*3/uL (ref 150–400)
RBC: 5.23 MIL/uL (ref 4.22–5.81)
RDW: 13.1 % (ref 11.5–15.5)
WBC: 5.4 10*3/uL (ref 4.0–10.5)

## 2014-02-10 LAB — PRO B NATRIURETIC PEPTIDE: Pro B Natriuretic peptide (BNP): 1044 pg/mL — ABNORMAL HIGH (ref 0–125)

## 2014-02-10 LAB — I-STAT TROPONIN, ED: Troponin i, poc: 0.01 ng/mL (ref 0.00–0.08)

## 2014-02-10 MED ORDER — FUROSEMIDE 10 MG/ML IJ SOLN
40.0000 mg | INTRAMUSCULAR | Status: AC
  Administered 2014-02-10: 40 mg via INTRAVENOUS
  Filled 2014-02-10: qty 4

## 2014-02-10 NOTE — ED Provider Notes (Signed)
CSN: [id number]     Arrival date & time 02/10/14  1450 History   First MD Initiated Contact with Patient 02/10/14 1537     Chief Complaint  Patient presents with  . Shortness of Breath     (Consider location/radiation/quality/duration/timing/severity/associated sxs/prior Treatment) HPI Comments: Patient is a 53 year old male with past medical history of diabetes, OSA, HTN, COPD, and CHF presents to ED with increasing shortness of breath for the past 3 days. It is worse during sleep, and patient notes he wakes up throughout the night gasping for air. He states breathing improves with sitting up. No DOE, but patient does not do much physical activity. He is noncompliant with his CPAP machine because he has had improvement with OSA symptoms after starting Lasix.  He states his symptoms feel like "the last time I had heart failure."  He has a productive cough with non-bloody, yellow-white sputum.  No fever, swelling in extremities, no abdominal pain or distension. No history of DVTs, no recent travel or surgeries. He denies chest pain, HA, N/V/D. He is currently taking aspirin, Lasix, Lisinopril, and metoprolol. He is a 33 pack year smoker, and currently smokes 1 ppd.  EF 25-30% in 2013.    Past Medical History  Diagnosis Date  . CHF (congestive heart failure)     Systolic and diastolic  . COPD (chronic obstructive pulmonary disease)   . Diabetes mellitus   . Hypertension   . Sleep apnea     Nocturnal CPAP  . Hyperlipidemia   . Non compliance with medical treatment   . Polysubstance abuse     History of quitting alcohol, marijuana and crack over 12 years ago  . Non-ischemic cardiomyopathy     Normal cath 2002  . NICM (nonischemic cardiomyopathy) 11/09/2011  . OSA on CPAP 11/09/2011  . HTN (hypertension) 11/09/2011  . DM (diabetes mellitus) 11/09/2011   Past Surgical History  Procedure Laterality Date  . None     Family History  Problem Relation Age of Onset  . Cancer Mother    History   Substance Use Topics  . Smoking status: Current Every Day Smoker -- 1.00 packs/day for 33 years    Types: Cigarettes  . Smokeless tobacco: Never Used  . Alcohol Use: No     Comment: QUIT IN 2000    Review of Systems  Constitutional: Negative for fever, chills and fatigue.  HENT: Negative for trouble swallowing.   Eyes: Negative for visual disturbance.  Respiratory: Positive for shortness of breath.   Cardiovascular: Negative for chest pain and palpitations.  Gastrointestinal: Negative for nausea, vomiting, abdominal pain and diarrhea.  Genitourinary: Negative for dysuria and difficulty urinating.  Musculoskeletal: Negative for arthralgias and neck pain.  Skin: Negative for color change.  Neurological: Negative for dizziness and weakness.  Psychiatric/Behavioral: Negative for dysphoric mood.      Allergies  Review of patient's allergies indicates no known allergies.  Home Medications   Prior to Admission medications   Medication Sig Start Date End Date Taking? Authorizing Provider  aspirin EC 81 MG tablet Take 1 tablet (81 mg total) by mouth daily. 10/19/13   Dorothy Spark, MD  furosemide (LASIX) 40 MG tablet Take 1 tablet (40 mg total) by mouth daily. 10/19/13   Dorothy Spark, MD  lisinopril (PRINIVIL,ZESTRIL) 5 MG tablet Take 1 tablet (5 mg total) by mouth daily. 10/19/13   Dorothy Spark, MD  metFORMIN (GLUCOPHAGE) 500 MG tablet Take 1 tablet (500 mg total) by mouth  2 (two) times daily with a meal. 10/19/13   Dorothy Spark, MD  metoprolol (LOPRESSOR) 50 MG tablet Take 1 tablet (50 mg total) by mouth daily. 10/19/13   Dorothy Spark, MD   BP 119/83  Pulse 88  Temp(Src) 97.8 F (36.6 C) (Oral)  Resp 18  Ht 6' (1.829 m)  Wt 286 lb (129.729 kg)  BMI 38.78 kg/m2  SpO2 97% Physical Exam  Nursing note and vitals reviewed. Constitutional: He is oriented to person, place, and time. He appears well-developed and well-nourished. No distress.  HENT:  Head:  Normocephalic and atraumatic.  Eyes: Conjunctivae and EOM are normal.  Neck: Normal range of motion.  Cardiovascular: Normal rate and regular rhythm.  Exam reveals no gallop and no friction rub.   No murmur heard. Pulmonary/Chest: Effort normal and breath sounds normal. He has no wheezes. He has no rales. He exhibits no tenderness.  Abdominal: Soft. He exhibits no distension. There is no tenderness. There is no rebound and no guarding.  Musculoskeletal: Normal range of motion.  Neurological: He is alert and oriented to person, place, and time. Coordination normal.  Speech is goal-oriented. Moves limbs without ataxia.   Skin: Skin is warm and dry.  Psychiatric: He has a normal mood and affect. His behavior is normal.    ED Course  Procedures (including critical care time) Labs Review Labs Reviewed  BASIC METABOLIC PANEL - Abnormal; Notable for the following:    Sodium 135 (*)    Glucose, Bld 317 (*)    All other components within normal limits  PRO B NATRIURETIC PEPTIDE - Abnormal; Notable for the following:    Pro B Natriuretic peptide (BNP) 1044.0 (*)    All other components within normal limits  CBC  I-STAT TROPOININ, ED    Imaging Review Dg Chest 2 View  02/10/2014   CLINICAL DATA:  Shortness of breath  EXAM: CHEST  2 VIEW  COMPARISON:  November 07, 2011  FINDINGS: The mediastinal contour is normal. The heart size is enlarged. There is diffuse increased pulmonary interstitium bilaterally. There is no focal pneumonia or pleural effusion. The visualized skeletal structures are stable.  IMPRESSION: Mild congestive heart failure.   Electronically Signed   By: Abelardo Diesel M.D.   On: 02/10/2014 15:21     EKG Interpretation None      MDM   Final diagnoses:  Acute congestive heart failure, unspecified congestive heart failure type    3:42 PM Chest xray shows mild congestive heart failure. Labs pending.   6:29 PM Patient has elevated BNP at 1044 with remaining vitals stable.  Patient likely having mild acute exacerbation of CHF at this time. He is able to maintain oxygen saturation at rest and while ambulating. Patient given 40mg  IV lasix here. I discussed the patient will Dr. Regenia Skeeter who agrees we can let the patient go home and follow up as an outpatient. Patient does not want to stay in the hospital either. Patient instructed to take 60mg  lasix at home for the next 3 days and follow up with cardiology next week. Patient instructed to return with worsening or concerning symptoms.   Alvina Chou, PA-C 02/10/14 1835

## 2014-02-10 NOTE — ED Notes (Addendum)
Pt reports progressively worsening SOB x 3 days; worse with laying flat and with exertion. States "it feels like my CHF." Lungs diminished bilaterally. Pt in NAD, speaks in complete sentences. Denies CP. Pitting edema noted to bilateral legs, pt unsure if this is new.

## 2014-02-10 NOTE — ED Notes (Signed)
Patient stated that he was feeling a little SOB and wanted to be seen before it got worse.  No distress noted.

## 2014-02-10 NOTE — ED Notes (Signed)
While ambulating pt, his O2 sat ranged from 98-100. Denied trouble breathing.

## 2014-02-10 NOTE — Discharge Instructions (Signed)
Take 60mg  furosemide for the next 3 days. Follow up with your Cardiologist next week. Return to the ED with worsening or concerning symptoms. Refer to attached documents for more information.

## 2014-02-11 NOTE — ED Provider Notes (Signed)
Medical screening examination/treatment/procedure(s) were conducted as a shared visit with non-physician practitioner(s) and myself.  I personally evaluated the patient during the encounter.   EKG Interpretation None      Patient with CHF exacerbation. Mild pulm edema, given IV lasix and able to ambulate without difficulty. No hypoxia. Stable for discharge, will increase lasix.  Ephraim Hamburger, MD 02/11/14 [phone]

## 2014-04-19 ENCOUNTER — Ambulatory Visit (INDEPENDENT_AMBULATORY_CARE_PROVIDER_SITE_OTHER): Payer: Self-pay | Admitting: Cardiology

## 2014-04-19 ENCOUNTER — Encounter: Payer: Self-pay | Admitting: Cardiology

## 2014-04-19 VITALS — BP 134/80 | HR 88 | Ht 72.0 in | Wt 282.0 lb

## 2014-04-19 DIAGNOSIS — I509 Heart failure, unspecified: Secondary | ICD-10-CM

## 2014-04-19 DIAGNOSIS — I5042 Chronic combined systolic (congestive) and diastolic (congestive) heart failure: Secondary | ICD-10-CM

## 2014-04-19 MED ORDER — METOPROLOL SUCCINATE ER 100 MG PO TB24: 100.0000 mg | ORAL_TABLET | Freq: Every day | ORAL | Status: DC

## 2014-04-19 NOTE — Patient Instructions (Signed)
Your physician has recommended you make the following change in your medication:   TAKE TOPROL XL 100 MG DAILY  Your physician wants you to follow-up in: [place] will receive a reminder letter in the mail two months in advance. If you don't receive a letter, please call our office to schedule the follow-up appointment.

## 2014-04-19 NOTE — Progress Notes (Signed)
Patient ID: CASMER YEPIZ, male   DOB: [birth year], 53 y.o.   MRN: [id number]     Patient Name: David Bright Date of Encounter: 04/19/2014  Primary Care Provider:  No PCP Per Patient Primary Cardiologist:  Dorothy Spark (previously Dr Verl Blalock)  Problem List   Past Medical History  Diagnosis Date  . CHF (congestive heart failure)     Systolic and diastolic  . COPD (chronic obstructive pulmonary disease)   . Diabetes mellitus   . Hypertension   . Sleep apnea     Nocturnal CPAP  . Hyperlipidemia   . Non compliance with medical treatment   . Polysubstance abuse     History of quitting alcohol, marijuana and crack over 12 years ago  . Non-ischemic cardiomyopathy     Normal cath 2002  . NICM (nonischemic cardiomyopathy) 11/09/2011  . OSA on CPAP 11/09/2011  . HTN (hypertension) 11/09/2011  . DM (diabetes mellitus) 11/09/2011   Past Surgical History  Procedure Laterality Date  . None      Allergies  No Known Allergies  HPI  A 53 year old male with h/o hypertension, NIDDM, OSA, hyperlipidemia and combined systolic and diastolic CHF. He has h/o non-ischemic cardiomyopathy. The patient hasn't followed in the last two years as he doesn't have medical insurance and cannot afford doctor's visits. As a consequence he ran out of some medications refill such as metformin, he doesn't have PCP. His last echocardiogram in 2011 showed severely dilated left ventricle with LVEF 25-30% and grade 2 diastolic dysfunction with elevated filling pressures. Normal RVSP.  He states that he has been doing fairly well, he gets on and off LE dema, but taking extra lasix takes care of it. He denies any cest pain, his DOE has been stable, NYHA II. He denies palpitations, syncope, PND or orthopnea.   04/19/2014 - the patient is coming after 6 months, he went to the ER on 02/10/2014 with CHF exacerbation, it has resolved with doubling dose of Lasix to 40 mg po BID. 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 No orthopnea or PND.   Home Medications  Prior to Admission medications   Medication Sig Start Date End Date Taking? Authorizing Provider  aspirin EC 325 MG tablet Take 325 mg by mouth daily.   Yes Historical Provider, MD  furosemide (LASIX) 40 MG tablet Take 1 tablet (40 mg total) by mouth daily. 05/17/13  Yes Dorothy Spark, MD  lisinopril (PRINIVIL,ZESTRIL) 5 MG tablet Take 1 tablet (5 mg total) by mouth daily. 05/17/13  Yes Dorothy Spark, MD  metoprolol (LOPRESSOR) 50 MG tablet Take 50 mg by mouth daily.   Yes Historical Provider, MD    Family History  Family History  Problem Relation Age of Onset  . Cancer Mother     Social History  History   Social History  . Marital Status: Single    Spouse Name: N/A    Number of Children: N/A  . 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 Smoking status: Current Every Day Smoker -- 1.00 packs/day for 33 years    Types: Cigarettes  . Smokeless tobacco: Never Used  . Alcohol Use: No     Comment: QUIT IN 2000  . Drug Use: No  . Sexual Activity: No   Other Topics Concern  . Not  on file   Social History Narrative   Lives in [place], [place] with housemate.      Review of Systems, as per HPI, otherwise negative General:  No chills, fever, night sweats or weight changes.  Cardiovascular:  No chest pain, dyspnea on exertion, edema, orthopnea, palpitations, paroxysmal nocturnal dyspnea. Dermatological: No rash, lesions/masses Respiratory: No cough, dyspnea Urologic: No hematuria, dysuria Abdominal:   No nausea, vomiting, diarrhea, bright red blood per rectum, melena, or hematemesis Neurologic:  No visual changes, wkns, changes in mental status. All other systems reviewed and are otherwise negative except as noted above.  Physical Exam  Blood pressure 134/80, pulse 88, height 6'  (1.829 m), weight 282 lb (127.914 kg).  General: Pleasant, NAD Psych: Normal affect. Neuro: Alert and oriented X 3. Moves all extremities spontaneously. HEENT: Normal  Neck: Supple without bruits or JVD. Lungs:  Resp regular and unlabored, CTA. Heart: RRR no s3, s4, or murmurs. Abdomen: Soft, non-tender, non-distended, BS + x 4.  Extremities: No clubbing, cyanosis or edema. DP/PT/Radials 2+ and equal bilaterally.  Labs:  No results found for this basename: CKTOTAL, CKMB, TROPONINI,  in the last 72 hours Lab Results  Component Value Date   WBC 5.4 02/10/2014   HGB 15.5 02/10/2014   HCT 45.5 02/10/2014   MCV 87.0 02/10/2014   PLT 210 02/10/2014   No results found for this basename: NA, K, CL, CO2, BUN, CREATININE, CALCIUM, LABALBU, PROT, BILITOT, ALKPHOS, ALT, AST, GLUCOSE,  in the last 168 hours Lab Results  Component Value Date   CHOL  Value: 173        ATP III CLASSIFICATION:  <200     mg/dL   Desirable  200-239  mg/dL   Borderline High  >=240    mg/dL   High        05/22/2010   HDL 42 05/22/2010   LDLCALC  Value: 116        Total Cholesterol/HDL:CHD Risk Coronary Heart Disease Risk Table                     Men   Women  1/2 Average Risk   3.4   3.3  Average Risk       5.0   4.4  2 X Average Risk   9.6   7.1  3 X Average Risk  23.4   11.0        Use the calculated Patient Ratio above and the CHD Risk Table to determine the patient's CHD Risk.        ATP III CLASSIFICATION (LDL):  <100     mg/dL   Optimal  100-129  mg/dL   Near or Above                    Optimal  130-159  mg/dL   Borderline  160-189  mg/dL   High  >190     mg/dL   Very High* 05/22/2010   TRIG 73 05/22/2010   No results found for this basename: DDIMER   No components found with this basename: POCBNP,   Accessory Clinical Findings  Echocardiogram - 05/21/2010  - Left ventricle: The cavity size was severely dilated. Wall thickness was increased in a pattern of mild LVH. Systolic function was severely reduced. The estimated  ejection fraction was in the range of 25% to 30%. Diffuse hypokinesis. Features are consistent with a pseudonormal left ventricular filling pattern, with concomitant abnormal relaxation and increased filling pressure (grade  2 diastolic dysfunction). - Aortic valve: Trivial regurgitation. - Mitral valve: Mild regurgitation. - Left atrium: The atrium was moderately dilated. - Pericardium, extracardiac: A trivial pericardial effusion was identified. There was a left pleural effusion.  ECG - SR, RAD, bi-atrial enlargement, non-specific ST-T wave abnormalities   Assessment & Plan  53 year old male   1. Combined chronic systolic and diastolic CHF - the patient is euvolemic, complaint to his meds, good fluid management with lasix. Advised on weight monitoring and taking extra lasix, he seems to be on the top of his CHF management.  2. Non-ischemic CMP - LVEF 25-30%, severely dilated LV, continue lisinopril, metoprolol, aspirin and lasix.  He is euvolemic, it would be helpful to reapeat his echo, but he can't afford it.   3. Abnormal ECG with STD and negative T waves in V5-6, however the patient is non-ischemic and asymptomatic, I will continue medical management.  4. NIDDM - we will refill metformin 500 mg po bid  Follow up in 6 months   Dorothy Spark, MD, [facility] 04/19/2014, 3:23 PM

## 2014-04-21 ENCOUNTER — Telehealth: Payer: Self-pay | Admitting: Cardiology

## 2014-04-21 MED ORDER — CARVEDILOL 12.5 MG PO TABS: 12.5000 mg | ORAL_TABLET | Freq: Two times a day (BID) | ORAL | Status: DC

## 2014-04-21 NOTE — Telephone Encounter (Signed)
Informed pt that I discontinued his Toprol XL and sent in new med order per Dr [name], carvedilol 12.5 mg po BID, for cost effective reasons.  Pt verbalized understanding, agrees with the plan, and gracious for all the assistance provided.

## 2014-04-21 NOTE — Telephone Encounter (Signed)
Will route this to Dr [name] for further review and recommendation and follow-up thereafter.

## 2014-04-21 NOTE — Telephone Encounter (Signed)
New message      Pt cannot afford metoprolol 100mg .  Is there anything else she can prescribe that will be in the $4.00 range?

## 2014-04-21 NOTE — Telephone Encounter (Signed)
Pt returned call// transferred to nurse//sr

## 2014-04-21 NOTE — Telephone Encounter (Signed)
Ivy, I thought that metoprolol was on 4 $ list. Lets try carvedilol 12.5 mg po BID. KN

## 2014-04-27 ENCOUNTER — Emergency Department (HOSPITAL_COMMUNITY): Payer: Self-pay

## 2014-04-27 ENCOUNTER — Emergency Department (HOSPITAL_COMMUNITY)
Admission: EM | Admit: 2014-04-27 | Discharge: 2014-04-27 | Disposition: A | Discharge status: Home / Self Care | Payer: Self-pay | Attending: Emergency Medicine | Admitting: Emergency Medicine

## 2014-04-27 ENCOUNTER — Encounter (HOSPITAL_COMMUNITY): Payer: Self-pay | Admitting: Emergency Medicine

## 2014-04-27 DIAGNOSIS — I10 Essential (primary) hypertension: Secondary | ICD-10-CM | POA: Insufficient documentation

## 2014-04-27 DIAGNOSIS — R7402 Elevation of levels of lactic acid dehydrogenase (LDH): Secondary | ICD-10-CM | POA: Insufficient documentation

## 2014-04-27 DIAGNOSIS — R0602 Shortness of breath: Secondary | ICD-10-CM | POA: Insufficient documentation

## 2014-04-27 DIAGNOSIS — R740 Nonspecific elevation of levels of transaminase and lactic acid dehydrogenase [LDH]: Secondary | ICD-10-CM

## 2014-04-27 DIAGNOSIS — E871 Hypo-osmolality and hyponatremia: Secondary | ICD-10-CM | POA: Insufficient documentation

## 2014-04-27 DIAGNOSIS — Z9981 Dependence on supplemental oxygen: Secondary | ICD-10-CM | POA: Insufficient documentation

## 2014-04-27 DIAGNOSIS — G4733 Obstructive sleep apnea (adult) (pediatric): Secondary | ICD-10-CM | POA: Insufficient documentation

## 2014-04-27 DIAGNOSIS — Z7982 Long term (current) use of aspirin: Secondary | ICD-10-CM | POA: Insufficient documentation

## 2014-04-27 DIAGNOSIS — Z79899 Other long term (current) drug therapy: Secondary | ICD-10-CM | POA: Insufficient documentation

## 2014-04-27 DIAGNOSIS — E119 Type 2 diabetes mellitus without complications: Secondary | ICD-10-CM | POA: Insufficient documentation

## 2014-04-27 DIAGNOSIS — J441 Chronic obstructive pulmonary disease with (acute) exacerbation: Secondary | ICD-10-CM | POA: Insufficient documentation

## 2014-04-27 DIAGNOSIS — F17200 Nicotine dependence, unspecified, uncomplicated: Secondary | ICD-10-CM | POA: Insufficient documentation

## 2014-04-27 DIAGNOSIS — I509 Heart failure, unspecified: Secondary | ICD-10-CM | POA: Insufficient documentation

## 2014-04-27 DIAGNOSIS — R7401 Elevation of levels of liver transaminase levels: Secondary | ICD-10-CM | POA: Insufficient documentation

## 2014-04-27 LAB — CBC WITH DIFFERENTIAL/PLATELET
BASOS ABS: 0 10*3/uL (ref 0.0–0.1)
BASOS PCT: 1 % (ref 0–1)
EOS PCT: 2 % (ref 0–5)
Eosinophils Absolute: 0.1 10*3/uL (ref 0.0–0.7)
HCT: 42.5 % (ref 39.0–52.0)
Hemoglobin: 14.8 g/dL (ref 13.0–17.0)
Lymphocytes Relative: 34 % (ref 12–46)
Lymphs Abs: 2.1 10*3/uL (ref 0.7–4.0)
MCH: 29.8 pg (ref 26.0–34.0)
MCHC: 34.8 g/dL (ref 30.0–36.0)
MCV: 85.5 fL (ref 78.0–100.0)
MONO ABS: 0.4 10*3/uL (ref 0.1–1.0)
Monocytes Relative: 7 % (ref 3–12)
Neutro Abs: 3.4 10*3/uL (ref 1.7–7.7)
Neutrophils Relative %: 56 % (ref 43–77)
Platelets: 199 10*3/uL (ref 150–400)
RBC: 4.97 MIL/uL (ref 4.22–5.81)
RDW: 13.3 % (ref 11.5–15.5)
WBC: 6 10*3/uL (ref 4.0–10.5)

## 2014-04-27 LAB — COMPREHENSIVE METABOLIC PANEL
ALT: 61 U/L — ABNORMAL HIGH (ref 0–53)
ANION GAP: 12 (ref 5–15)
AST: 45 U/L — AB (ref 0–37)
Albumin: 3.4 g/dL — ABNORMAL LOW (ref 3.5–5.2)
Alkaline Phosphatase: 96 U/L (ref 39–117)
BILIRUBIN TOTAL: 0.7 mg/dL (ref 0.3–1.2)
BUN: 18 mg/dL (ref 6–23)
CHLORIDE: 99 meq/L (ref 96–112)
CO2: 24 mEq/L (ref 19–32)
Calcium: 8.9 mg/dL (ref 8.4–10.5)
Creatinine, Ser: 0.87 mg/dL (ref 0.50–1.35)
GFR calc Af Amer: >90 mL/min (ref >90)
GFR calc non Af Amer: >90 mL/min (ref >90)
Glucose, Bld: 262 mg/dL — ABNORMAL HIGH (ref 70–99)
POTASSIUM: 4 meq/L (ref 3.7–5.3)
Sodium: 135 mEq/L — ABNORMAL LOW (ref 137–147)
Total Protein: 6.9 g/dL (ref 6.0–8.3)

## 2014-04-27 LAB — I-STAT TROPONIN, ED: TROPONIN I, POC: 0.02 ng/mL (ref 0.00–0.08)

## 2014-04-27 LAB — PRO B NATRIURETIC PEPTIDE: Pro B Natriuretic peptide (BNP): 1567 pg/mL — ABNORMAL HIGH (ref 0–125)

## 2014-04-27 MED ORDER — FUROSEMIDE 10 MG/ML IJ SOLN
40.0000 mg | Freq: Once | INTRAMUSCULAR | Status: AC
  Administered 2014-04-27: 40 mg via INTRAVENOUS
  Filled 2014-04-27: qty 4

## 2014-04-27 MED ORDER — ASPIRIN 325 MG PO TABS
325.0000 mg | ORAL_TABLET | ORAL | Status: AC
  Administered 2014-04-27: 325 mg via ORAL
  Filled 2014-04-27: qty 1

## 2014-04-27 MED ORDER — FUROSEMIDE 40 MG PO TABS: ORAL_TABLET | ORAL | Status: DC

## 2014-04-27 NOTE — Discharge Instructions (Signed)

## 2014-04-27 NOTE — ED Notes (Signed)
Gradual onset of sob, h/o htn, takes lasix, LS diminished, pitting pedal edema  Denies fever.

## 2014-04-27 NOTE — ED Provider Notes (Signed)
CSN: [id number]     Arrival date & time 04/27/14  0998 History   First MD Initiated Contact with Patient 04/27/14 [phone]     Chief Complaint  Patient presents with  . Shortness of Breath     (Consider location/radiation/quality/duration/timing/severity/associated sxs/prior Treatment) Patient is a 53 y.o. male presenting with shortness of breath. The history is provided by the patient.  Shortness of Breath He has a history congestive heart failure and has noted some increasing dyspnea over the last 2-3 weeks. Dyspnea is worse with lying supine and got significantly worse tonight. He states he was unable to sleep tonight. He's also noted some mild swelling in his legs. He denies chest pain, heaviness, tightness, pressure. He denies nausea or vomiting. He claims compliance with his medications.  Past Medical History  Diagnosis Date  . CHF (congestive heart failure)     Systolic and diastolic  . COPD (chronic obstructive pulmonary disease)   . Diabetes mellitus   . Hypertension   . Sleep apnea     Nocturnal CPAP  . Hyperlipidemia   . Non compliance with medical treatment   . Polysubstance abuse     History of quitting alcohol, marijuana and crack over 12 years ago  . Non-ischemic cardiomyopathy     Normal cath 2002  . NICM (nonischemic cardiomyopathy) 11/09/2011  . OSA on CPAP 11/09/2011  . HTN (hypertension) 11/09/2011  . DM (diabetes mellitus) 11/09/2011   Past Surgical History  Procedure Laterality Date  . None     Family History  Problem Relation Age of Onset  . Cancer Mother    History  Substance Use Topics  . Smoking status: Current Every Day Smoker -- 1.00 packs/day for 33 years    Types: Cigarettes  . Smokeless tobacco: Never Used  . Alcohol Use: No     Comment: QUIT IN 2000    Review of Systems  Respiratory: Positive for shortness of breath.   All other systems reviewed and are negative.     Allergies  Review of patient's allergies indicates no known  allergies.  Home Medications   Prior to Admission medications   Medication Sig Start Date End Date Taking? Authorizing Provider  aspirin EC 81 MG tablet Take 1 tablet (81 mg total) by mouth daily. 10/19/13  Yes Dorothy Spark, MD  carvedilol (COREG) 12.5 MG tablet Take 1 tablet (12.5 mg total) by mouth 2 (two) times daily. 04/21/14  Yes Dorothy Spark, MD  furosemide (LASIX) 40 MG tablet Take 1 tablet (40 mg total) by mouth daily. 10/19/13  Yes Dorothy Spark, MD  lisinopril (PRINIVIL,ZESTRIL) 5 MG tablet Take 1 tablet (5 mg total) by mouth daily. 10/19/13  Yes Dorothy Spark, MD  metFORMIN (GLUCOPHAGE) 500 MG tablet Take 1 tablet (500 mg total) by mouth 2 (two) times daily with a meal. 10/19/13  Yes Dorothy Spark, MD   BP 121/69  Pulse 83  Temp(Src) 98.1 F (36.7 C) (Oral)  Resp 22  SpO2 99% Physical Exam  Nursing note and vitals reviewed.  53 year old male, resting comfortably and in no acute distress. Vital signs are normal. Oxygen saturation is 96%, which is normal. Head is normocephalic and atraumatic. PERRLA, EOMI. Oropharynx is clear. Neck is nontender and supple without adenopathy or JVD. Back is nontender and there is no CVA tenderness. Lungs have faint bibasilar rales which are more prominent on the right. There are no wheezes or rhonchi. Chest is nontender. Heart has regular rate and  rhythm without murmur. Abdomen is soft, flat, nontender without masses or hepatosplenomegaly and peristalsis is normoactive. Extremities have 2+ pretibial edema, full range of motion is present. There is also trace presacral edema. Skin is warm and dry without rash. Neurologic: Mental status is normal, cranial nerves are intact, there are no motor or sensory deficits.  ED Course  Procedures (including critical care time) Labs Review Results for orders placed during the hospital encounter of 04/27/14  COMPREHENSIVE METABOLIC PANEL      Result Value Ref Range   Sodium 135 (*)  137 - 147 mEq/L   Potassium 4.0  3.7 - 5.3 mEq/L   Chloride 99  96 - 112 mEq/L   CO2 24  19 - 32 mEq/L   Glucose, Bld 262 (*) 70 - 99 mg/dL   BUN 18  6 - 23 mg/dL   Creatinine, Ser 0.87  0.50 - 1.35 mg/dL   Calcium 8.9  8.4 - 10.5 mg/dL   Total Protein 6.9  6.0 - 8.3 g/dL   Albumin 3.4 (*) 3.5 - 5.2 g/dL   AST 45 (*) 0 - 37 U/L   ALT 61 (*) 0 - 53 U/L   Alkaline Phosphatase 96  39 - 117 U/L   Total Bilirubin 0.7  0.3 - 1.2 mg/dL   GFR calc non Af Amer >90  >90 mL/min   GFR calc Af Amer >90  >90 mL/min   Anion gap 12  5 - 15  CBC WITH DIFFERENTIAL      Result Value Ref Range   WBC 6.0  4.0 - 10.5 K/uL   RBC 4.97  4.22 - 5.81 MIL/uL   Hemoglobin 14.8  13.0 - 17.0 g/dL   HCT 42.5  39.0 - 52.0 %   MCV 85.5  78.0 - 100.0 fL   MCH 29.8  26.0 - 34.0 pg   MCHC 34.8  30.0 - 36.0 g/dL   RDW 13.3  11.5 - 15.5 %   Platelets 199  150 - 400 K/uL   Neutrophils Relative % 56  43 - 77 %   Neutro Abs 3.4  1.7 - 7.7 K/uL   Lymphocytes Relative 34  12 - 46 %   Lymphs Abs 2.1  0.7 - 4.0 K/uL   Monocytes Relative 7  3 - 12 %   Monocytes Absolute 0.4  0.1 - 1.0 K/uL   Eosinophils Relative 2  0 - 5 %   Eosinophils Absolute 0.1  0.0 - 0.7 K/uL   Basophils Relative 1  0 - 1 %   Basophils Absolute 0.0  0.0 - 0.1 K/uL  PRO B NATRIURETIC PEPTIDE      Result Value Ref Range   Pro B Natriuretic peptide (BNP) 1567.0 (*) 0 - 125 pg/mL  I-STAT TROPOININ, ED      Result Value Ref Range   Troponin i, poc 0.02  0.00 - 0.08 ng/mL   Comment 3            Imaging Review Dg Chest Port 1 View  04/27/2014   CLINICAL DATA:  Shortness of breath.  EXAM: PORTABLE CHEST - 1 VIEW  COMPARISON:  Chest radiograph February 10, 2014  FINDINGS: The cardiac silhouette appears at least moderately enlarged, similar to prior examination. Increasing central pulmonary vascular congestion and interstitial prominence. Similar LEFT midlung zone linear atelectasis/ scarring. No definite pleural effusions. No pneumothorax. Soft tissue  planes and included osseous structures are unchanged.  IMPRESSION: Stable cardiomegaly, worsening interstitial prominence most consistent with  pulmonary edema without focal consolidation.   Electronically Signed   By: Elon Alas   On: 04/27/2014 06:36     EKG Interpretation   Date/Time:  Wednesday April 27 2014 05:35:11 EDT Ventricular Rate:  87 PR Interval:  184 QRS Duration: 122 QT Interval:  424 QTC Calculation: 510 R Axis:   140 Text Interpretation:  Normal sinus rhythm Biatrial enlargement Right axis  deviation Non-specific intra-ventricular conduction delay Abnormal ECG  When compared with ECG of 02/10/2014, No significant change was found  Confirmed by [facility]  MD, Kostas ([id number]) on 04/27/2014 6:12:10 AM      MDM   Final diagnoses:  CHF exacerbation  Elevated transaminase level  Hyponatremia    CHF exacerbation. Old records are reviewed and he has been followed by cardiology. Office visit one week ago discontinued metoprolol and substituted carvedilol but there have been no other changes in his medications recently. ECG shows no acute changes. He'll be given a dose of furosemide in the ED and anticipate he should be able to go home on an increased dose of diuretics for the next several days.  Laboratory evaluation shows BNP elevated over baseline. He had adequate diuresis from his dose of furosemide and was able to ambulate in the ED without oxygen desaturation. He states that he has run out of his furosemide so is given a prescription for a one-month supply but is instructed to take a double dose for the next 3 days. Followup with his cardiologist in the next 5 days. Return should symptoms worsen.  Delora Fuel, MD 66/06/00 [id number]

## 2014-04-27 NOTE — ED Notes (Signed)
Pt reports when he is trying to sleep he starts to feel sob. Nad, skin warm and dry, resp e/u. VSS.

## 2014-10-20 ENCOUNTER — Ambulatory Visit (INDEPENDENT_AMBULATORY_CARE_PROVIDER_SITE_OTHER): Payer: Self-pay | Admitting: Cardiology

## 2014-10-20 ENCOUNTER — Encounter: Payer: Self-pay | Admitting: Cardiology

## 2014-10-20 VITALS — BP 124/66 | HR 92 | Ht 72.0 in | Wt 266.0 lb

## 2014-10-20 DIAGNOSIS — R0609 Other forms of dyspnea: Secondary | ICD-10-CM

## 2014-10-20 DIAGNOSIS — R9431 Abnormal electrocardiogram [ECG] [EKG]: Secondary | ICD-10-CM | POA: Insufficient documentation

## 2014-10-20 DIAGNOSIS — I10 Essential (primary) hypertension: Secondary | ICD-10-CM

## 2014-10-20 DIAGNOSIS — I5043 Acute on chronic combined systolic (congestive) and diastolic (congestive) heart failure: Secondary | ICD-10-CM

## 2014-10-20 DIAGNOSIS — I5042 Chronic combined systolic (congestive) and diastolic (congestive) heart failure: Secondary | ICD-10-CM

## 2014-10-20 DIAGNOSIS — R0602 Shortness of breath: Secondary | ICD-10-CM

## 2014-10-20 MED ORDER — FUROSEMIDE 40 MG PO TABS: 40.0000 mg | ORAL_TABLET | Freq: Two times a day (BID) | ORAL | Status: DC

## 2014-10-20 NOTE — Patient Instructions (Addendum)
Your physician has recommended you make the following change in your medication:   INCREASE YOUR LASIX TO 40 [place] METOPROLOL NOW     Your physician has requested that you have en exercise stress myoview. For further information please visit [email or web address]. Please follow instruction sheet, as given.    Your physician recommends that you schedule a follow-up appointment in: 3-4 [place]

## 2014-10-20 NOTE — Progress Notes (Signed)
Patient ID: David Bright, male   DOB: Mar 30, 1961, 54 y.o.   MRN: [id number]     Patient Name: David Bright Date of Encounter: 10/20/2014  Primary Care Provider:  No PCP Per Patient Primary Cardiologist:  Dorothy Spark (previously Dr Verl Blalock)  Problem List   Past Medical History  Diagnosis Date  . CHF (congestive heart failure)     Systolic and diastolic  . COPD (chronic obstructive pulmonary disease)   . Diabetes mellitus   . Hypertension   . Sleep apnea     Nocturnal CPAP  . Hyperlipidemia   . Non compliance with medical treatment   . Polysubstance abuse     History of quitting alcohol, marijuana and crack over 12 years ago  . Non-ischemic cardiomyopathy     Normal cath 2002  . NICM (nonischemic cardiomyopathy) 11/09/2011  . OSA on CPAP 11/09/2011  . HTN (hypertension) 11/09/2011  . DM (diabetes mellitus) 11/09/2011   Past Surgical History  Procedure Laterality Date  . None      Allergies  No Known Allergies  HPI  A 54 year old male with h/o hypertension, NIDDM, OSA, hyperlipidemia and combined systolic and diastolic CHF. He has h/o non-ischemic cardiomyopathy. The patient hasn't followed in the last two years as he doesn't have medical insurance and cannot afford doctor's visits. As a consequence he ran out of some medications refill such as metformin, he doesn't have PCP. His last echocardiogram in 2011 showed severely dilated left ventricle with LVEF 25-30% and grade 2 diastolic dysfunction with elevated filling pressures. Normal RVSP.  He states that he has been doing fairly well, he gets on and off LE dema, but taking extra lasix takes care of it. He denies any cest pain, his DOE has been stable, NYHA II. He denies palpitations, syncope, PND or orthopnea.   04/19/2014 - the patient is coming after 6 months, he went to the ER on 02/10/2014 with CHF exacerbation, it has resolved with doubling dose of Lasix to 40 mg po BID. 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 No orthopnea or PND.   10/20/2014 - the patient is coming after 6 months, he states that he feels some worsening LE edema and mildly worsening DOE. The patient at nights loading newspaper trucks. He would have some chest heaviness associated with SOB. No palpitations of syncope.  He hasn't had any testing done since 2008 as he had no insurance, he currently got insured under [employer].   Home Medications  Prior to Admission medications   Medication Sig Start Date End Date Taking? Authorizing Provider  aspirin EC 325 MG tablet Take 325 mg by mouth daily.   Yes Historical Provider, MD  furosemide (LASIX) 40 MG tablet Take 1 tablet (40 mg total) by mouth daily. 05/17/13  Yes Dorothy Spark, MD  lisinopril (PRINIVIL,ZESTRIL) 5 MG tablet Take 1 tablet (5 mg total) by mouth daily. 05/17/13  Yes Dorothy Spark, MD  metoprolol (LOPRESSOR) 50 MG tablet Take 50 mg by mouth daily.   Yes Historical Provider, MD    Family History  Family History  Problem Relation Age of Onset  . Cancer Mother     Social History  History   Social History  . Marital Status: Single    Spouse Name: N/A  . Number of Children: N/A  . Years of Education: N/A   Occupational History  . 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 Smoking status: Current Every Day Smoker -- 1.00 packs/day for 33 years    Types: Cigarettes  . Smokeless tobacco: Never Used  . Alcohol Use: No     Comment: QUIT IN 2000  . Drug Use: No  . Sexual Activity: No   Other Topics Concern  . Not on file   Social History Narrative   Lives in [place], [place] with housemate.      Review of Systems, as per HPI, otherwise negative General:  No chills, fever, night sweats or weight changes.  Cardiovascular:  No chest pain, dyspnea on exertion, edema, orthopnea, palpitations, paroxysmal nocturnal dyspnea. Dermatological: No rash,  lesions/masses Respiratory: No cough, dyspnea Urologic: No hematuria, dysuria Abdominal:   No nausea, vomiting, diarrhea, bright red blood per rectum, melena, or hematemesis Neurologic:  No visual changes, wkns, changes in mental status. All other systems reviewed and are otherwise negative except as noted above.  Physical Exam  Height 6' (1.829 m).  General: Pleasant, NAD Psych: Normal affect. Neuro: Alert and oriented X 3. Moves all extremities spontaneously. HEENT: Normal  Neck: Supple without bruits or JVD. Lungs:  Resp regular and unlabored, CTA. Heart: RRR no s3, s4, or murmurs. Abdomen: Soft, non-tender, non-distended, BS + x 4.  Extremities: No clubbing, cyanosis or edema. DP/PT/Radials 2+ and equal bilaterally.  Labs:  No results for input(s): CKTOTAL, CKMB, TROPONINI in the last 72 hours. Lab Results  Component Value Date   WBC 6.0 04/27/2014   HGB 14.8 04/27/2014   HCT 42.5 04/27/2014   MCV 85.5 04/27/2014   PLT 199 04/27/2014   No results for input(s): NA, K, CL, CO2, BUN, CREATININE, CALCIUM, PROT, BILITOT, ALKPHOS, ALT, AST, GLUCOSE in the last 168 hours.  Invalid input(s): LABALBU Lab Results  Component Value Date   CHOL  05/22/2010    173        ATP III CLASSIFICATION:  <200     mg/dL   Desirable  200-239  mg/dL   Borderline High  >=240    mg/dL   High          HDL 42 05/22/2010   LDLCALC * 05/22/2010    116        Total Cholesterol/HDL:CHD Risk Coronary Heart Disease Risk Table                     Men   Women  1/2 Average Risk   3.4   3.3  Average Risk       5.0   4.4  2 X Average Risk   9.6   7.1  3 X Average Risk  23.4   11.0        Use the calculated Patient Ratio above and the CHD Risk Table to determine the patient's CHD Risk.        ATP III CLASSIFICATION (LDL):  <100     mg/dL   Optimal  100-129  mg/dL   Near or Above                    Optimal  130-159  mg/dL   Borderline  160-189  mg/dL   High  >190     mg/dL   Very High   TRIG  73 05/22/2010   No results found for: DDIMER Invalid input(s): POCBNP  Accessory Clinical Findings  Echocardiogram - 05/21/2010  - Left ventricle: The cavity size was severely dilated. Wall thickness was increased in a  pattern of mild LVH. Systolic function was severely reduced. The estimated ejection fraction was in the range of 25% to 30%. Diffuse hypokinesis. Features are consistent with a pseudonormal left ventricular filling pattern, with concomitant abnormal relaxation and increased filling pressure (grade 2 diastolic dysfunction). - Aortic valve: Trivial regurgitation. - Mitral valve: Mild regurgitation. - Left atrium: The atrium was moderately dilated. - Pericardium, extracardiac: A trivial pericardial effusion was identified. There was a left pleural effusion.  ECG - SR, RAD, bi-atrial enlargement, lateral wall ischemia    Assessment & Plan  54 year old male   1. Acute on chronic combined chronic systolic and diastolic CHF - the patient is mildly fluid overloaded, we will increase lasix to 40 mg po BID.   2. DOE - abnormal ECG - new lateral wall ischemia, we will schedule an exercise nuclear stress test  3. Non-ischemic CMP - LVEF 25-30%, severely dilated LV, continue lisinopril, carvedilol, aspirin and lasix.   4. Abnormal ECG with STD and negative T waves in V5-6, however the patient is non-ischemic and asymptomatic, I will continue medical management.  5. NIDDM - we will refill metformin 500 mg po bid  Follow up in 1 month.   Dorothy Spark, MD, [facility] 10/20/2014, 12:25 PM

## 2014-10-31 ENCOUNTER — Ambulatory Visit (HOSPITAL_COMMUNITY): Payer: Self-pay | Attending: Cardiology | Admitting: Radiology

## 2014-10-31 DIAGNOSIS — R0602 Shortness of breath: Secondary | ICD-10-CM

## 2014-10-31 DIAGNOSIS — R0609 Other forms of dyspnea: Secondary | ICD-10-CM | POA: Insufficient documentation

## 2014-10-31 DIAGNOSIS — R9431 Abnormal electrocardiogram [ECG] [EKG]: Secondary | ICD-10-CM

## 2014-10-31 DIAGNOSIS — I5042 Chronic combined systolic (congestive) and diastolic (congestive) heart failure: Secondary | ICD-10-CM

## 2014-10-31 DIAGNOSIS — E119 Type 2 diabetes mellitus without complications: Secondary | ICD-10-CM | POA: Insufficient documentation

## 2014-10-31 DIAGNOSIS — R079 Chest pain, unspecified: Secondary | ICD-10-CM | POA: Insufficient documentation

## 2014-10-31 DIAGNOSIS — I10 Essential (primary) hypertension: Secondary | ICD-10-CM | POA: Insufficient documentation

## 2014-10-31 MED ORDER — REGADENOSON 0.4 MG/5ML IV SOLN
0.4000 mg | Freq: Once | INTRAVENOUS | Status: AC
  Administered 2014-10-31: 0.4 mg via INTRAVENOUS

## 2014-10-31 MED ORDER — TECHNETIUM TC 99M SESTAMIBI GENERIC - CARDIOLITE
11.0000 | Freq: Once | INTRAVENOUS | Status: AC | PRN
  Administered 2014-10-31: 11 via INTRAVENOUS

## 2014-10-31 MED ORDER — TECHNETIUM TC 99M SESTAMIBI GENERIC - CARDIOLITE
33.0000 | Freq: Once | INTRAVENOUS | Status: AC | PRN
  Administered 2014-10-31: 33 via INTRAVENOUS

## 2014-10-31 NOTE — Progress Notes (Signed)
[facility] [address] [place], [place] [address] [phone]    Cardiology Nuclear Med Study  David Bright is a 54 y.o. male     MRN : [id number]     DOB: Dec 16, 1960  Procedure Date: 10/31/2014  Nuclear Med Background Indication for Stress Test:  Evaluation for Ischemia History:  [facility] Nuclear Study 08' Scar EF 33%;COPD,CHF,NICM Cardiac Risk Factors: Hypertension and NIDDM  Symptoms:  Chest Pain and DOE   Nuclear Pre-Procedure Caffeine/Decaff Intake:  None NPO After: 2 PM   Lungs:  clear O2 Sat: 97% on room air. IV 0.9% NS with Angio Cath:  22g  IV Site: R Hand  IV Started by:  Crissie Figures, RN  Chest Size (in):  50 Cup Size: n/a  Height: 6' (1.829 m)  Weight:  256 lb (116.121 kg)  BMI:  Body mass index is 34.71 kg/(m^2). Tech Comments:  n/a    Nuclear Med Study 1 or 2 day study: 1 day  Stress Test Type:  Lexiscan  Reading MD: n/a  Order Authorizing Provider: Ena Dawley, MD  Resting Radionuclide: Technetium 10m Sestamibi  Resting Radionuclide Dose: 11.0 mCi   Stress Radionuclide:  Technetium 50m Sestamibi  Stress Radionuclide Dose: 33.0 mCi           Stress Protocol Rest HR: 71 Stress HR: 86  Rest BP: 146/85 Stress BP: 142/66  Exercise Time (min): n/a METS: n/a   Predicted Max HR: 165 bpm % Max HR: 52.12 bpm Rate Pressure Product: 12298   Dose of Adenosine (mg):  n/a Dose of Lexiscan: 0.4 mg  Dose of Atropine (mg): n/a Dose of Dobutamine: n/a mcg/kg/min (at max HR)  Stress Test Technologist: Ileene Hutchinson, EMT-P  Nuclear Technologist:  Earl Many, CNMT     Rest Procedure:  Myocardial perfusion imaging was performed at rest 45 minutes following the intravenous administration of Technetium 72m Sestamibi. Rest ECG: NSR-LVH  Stress Procedure:  The patient received IV Lexiscan 0.4 mg over 15-seconds.  Technetium 76m Sestamibi injected at 30-seconds.  Quantitative spect images were obtained after a 45 minute  delay. Stress ECG: No significant change from baseline ECG  QPS Raw Data Images:  Normal; no motion artifact; normal heart/lung ratio. Stress Images:  Normal homogeneous uptake in all areas of the myocardium. Rest Images:  Normal homogeneous uptake in all areas of the myocardium. Subtraction (SDS):  No evidence of ischemia. Transient Ischemic Dilatation (Normal <1.22):  0.97 Lung/Heart Ratio (Normal <0.45):  0.31  Quantitative Gated Spect Images QGS EDV:  284 ml QGS ESV:  98 ml  Impression Exercise Capacity:  Lexiscan with no exercise. BP Response:  Normal blood pressure response. Clinical Symptoms:  No chest pain. ECG Impression:  No significant ST segment change suggestive of ischemia. Comparison with Prior Nuclear Study: Since prior study of 2008 the EF has fallen from 33% to 26%.  Overall Impression:  Low risk stress nuclear study. There is no evidence of ischemia. The LV is significantly dilated with low EF. Pattern is consistent with a dilated nonischemic cardiomyopathy.  LV Ejection Fraction: 26%.  LV Wall Motion:  Global hypokinesis.   Darlin Coco MD

## 2014-11-01 ENCOUNTER — Telehealth: Payer: Self-pay | Admitting: Cardiology

## 2014-11-01 NOTE — Telephone Encounter (Signed)
We can prescribe him Viagra 25 mg po PRN x 10 pills, advise never to take with NTG.

## 2014-11-01 NOTE — Telephone Encounter (Signed)
Pt calling to ask Dr [name] if she could prescribe him something for erectile dysfunction.  Pt states since starting his BP medication, he is having difficulty getting aroused.  Informed the pt that this is a common side effect of BP medications.  Informed the pt that Dr [name] is currently out of the office at this time, but I will forward this message to her for further review and recommendation and follow-up thereafter.  Pt verbalized understanding and agrees with this plan.

## 2014-11-01 NOTE — Telephone Encounter (Signed)
New message      Talk to a nurse----pt said it was personal.

## 2014-11-02 MED ORDER — SILDENAFIL CITRATE 25 MG PO TABS: 25.0000 mg | ORAL_TABLET | Freq: Every day | ORAL | Status: DC | PRN

## 2014-11-02 NOTE — Telephone Encounter (Signed)
Contacted the pt to inform him that per Dr [name] she will prescribe him Viagra 25 mg po PRN for erectile dysfunction.  Informed the pt that she prescribed this for 10 pills only.  Advised the pt that per Dr [name], he should never take this med with nitroglycerin.  Pt states he has no nitro and has never been prescribed this.  Sent the script to pts pharmacy of choice. Pt verbalized understanding, agrees with this plan, and gracious for all the assistance provided.

## 2014-11-10 ENCOUNTER — Telehealth: Payer: Self-pay | Admitting: Cardiology

## 2014-11-10 NOTE — Telephone Encounter (Signed)
New message      Pt cannot afford viagra.  Is there any anything cheaper he can take?   Ok to call tomorrow

## 2014-11-10 NOTE — Telephone Encounter (Signed)
Pt calling to ask if there is a cheaper form of viagra he could be subscribed, for it will cost him $10 a pill from [facility].  Informed the pt that is the going price for Viagra, for that's why 10 pills are prescribed as needed.  Pt asking if its safe to take OTC enhancements for erectile dysfunction.  Highly advised the pt to avoid this because of his cardiac hx, and these meds are not FDA approved.  Pt verbalized understanding, and gracious for all the assistance provided.

## 2014-11-16 ENCOUNTER — Other Ambulatory Visit: Payer: Self-pay

## 2014-11-16 MED ORDER — LISINOPRIL 5 MG PO TABS: 5.0000 mg | ORAL_TABLET | Freq: Every day | ORAL | Status: DC

## 2014-11-22 ENCOUNTER — Ambulatory Visit (INDEPENDENT_AMBULATORY_CARE_PROVIDER_SITE_OTHER): Payer: No Typology Code available for payment source | Admitting: Cardiology

## 2014-11-22 ENCOUNTER — Encounter: Payer: Self-pay | Admitting: Cardiology

## 2014-11-22 VITALS — BP 132/72 | HR 72 | Ht 72.0 in | Wt 259.0 lb

## 2014-11-22 DIAGNOSIS — N5201 Erectile dysfunction due to arterial insufficiency: Secondary | ICD-10-CM

## 2014-11-22 DIAGNOSIS — I429 Cardiomyopathy, unspecified: Secondary | ICD-10-CM | POA: Diagnosis not present

## 2014-11-22 DIAGNOSIS — I5023 Acute on chronic systolic (congestive) heart failure: Secondary | ICD-10-CM | POA: Diagnosis not present

## 2014-11-22 DIAGNOSIS — I10 Essential (primary) hypertension: Secondary | ICD-10-CM

## 2014-11-22 DIAGNOSIS — I428 Other cardiomyopathies: Secondary | ICD-10-CM

## 2014-11-22 DIAGNOSIS — I5042 Chronic combined systolic (congestive) and diastolic (congestive) heart failure: Secondary | ICD-10-CM

## 2014-11-22 DIAGNOSIS — R0609 Other forms of dyspnea: Secondary | ICD-10-CM

## 2014-11-22 DIAGNOSIS — R0602 Shortness of breath: Secondary | ICD-10-CM

## 2014-11-22 LAB — BRAIN NATRIURETIC PEPTIDE: Pro B Natriuretic peptide (BNP): 236 pg/mL — ABNORMAL HIGH (ref 0.0–100.0)

## 2014-11-22 LAB — COMPREHENSIVE METABOLIC PANEL
ALT: 18 U/L (ref 0–53)
AST: 14 U/L (ref 0–37)
Albumin: 3.4 g/dL — ABNORMAL LOW (ref 3.5–5.2)
Alkaline Phosphatase: 74 U/L (ref 39–117)
BUN: 19 mg/dL (ref 6–23)
CO2: 30 mEq/L (ref 19–32)
Calcium: 9 mg/dL (ref 8.4–10.5)
Chloride: 103 mEq/L (ref 96–112)
Creatinine, Ser: 0.92 mg/dL (ref 0.40–1.50)
GFR: 90.75 mL/min (ref >60.00)
Glucose, Bld: 198 mg/dL — ABNORMAL HIGH (ref 70–99)
Potassium: 3.6 mEq/L (ref 3.5–5.1)
Sodium: 137 mEq/L (ref 135–145)
Total Bilirubin: 0.6 mg/dL (ref 0.2–1.2)
Total Protein: 6.6 g/dL (ref 6.0–8.3)

## 2014-11-22 MED ORDER — SPIRONOLACTONE 25 MG PO TABS: 25.0000 mg | ORAL_TABLET | Freq: Every day | ORAL | Status: DC

## 2014-11-22 NOTE — Patient Instructions (Signed)
Medication Instructions:   START TAKING SPIRONOLACTONE 25 MG ONCE DAILY   Labwork:  TODAY-    CMET AND BNP    Follow-Up: Your physician recommends that you schedule a follow-up appointment in: [place]

## 2014-11-22 NOTE — Addendum Note (Signed)
Addended by: Nuala Alpha on: 11/22/2014 11:40 AM   Modules accepted: Orders

## 2014-11-22 NOTE — Progress Notes (Signed)
Patient ID: David Bright, male   DOB: Jan 19, 1961, 54 y.o.   MRN: [id number]     Patient Name: David Bright Date of Encounter: 11/22/2014  Primary Care Provider:  No PCP Per Patient Primary Cardiologist:  Dorothy Spark (previously Dr Verl Blalock)  Problem List   Past Medical History  Diagnosis Date  . CHF (congestive heart failure)     Systolic and diastolic  . COPD (chronic obstructive pulmonary disease)   . Diabetes mellitus   . Hypertension   . Sleep apnea     Nocturnal CPAP  . Hyperlipidemia   . Non compliance with medical treatment   . Polysubstance abuse     History of quitting alcohol, marijuana and crack over 12 years ago  . Non-ischemic cardiomyopathy     Normal cath 2002  . NICM (nonischemic cardiomyopathy) 11/09/2011  . OSA on CPAP 11/09/2011  . HTN (hypertension) 11/09/2011  . DM (diabetes mellitus) 11/09/2011   Past Surgical History  Procedure Laterality Date  . None      Allergies  No Known Allergies  Chief complain: Dyspnea on exertion, lower extremity edema.  HPI  A 54 year old male with h/o hypertension, NIDDM, OSA, hyperlipidemia and combined systolic and diastolic CHF. He has h/o non-ischemic cardiomyopathy. The patient hasn't followed in the last two years as he doesn't have medical insurance and cannot afford doctor's visits. As a consequence he ran out of some medications refill such as metformin, he doesn't have PCP. His last echocardiogram in 2011 showed severely dilated left ventricle with LVEF 25-30% and grade 2 diastolic dysfunction with elevated filling pressures. Normal RVSP.  He states that he has been doing fairly well, he gets on and off LE dema, but taking extra lasix takes care of it. He denies any cest pain, his DOE has been stable, NYHA II. He denies palpitations, syncope, PND or orthopnea.   04/19/2014 - the patient is coming after 6 months, he went to the ER on 02/10/2014 with CHF exacerbation, it has resolved with doubling dose of Lasix to  40 mg po BID. 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 No orthopnea or PND.   10/20/2014 - the patient is coming after 6 months, he states that he feels some worsening LE edema and mildly worsening DOE. The patient at nights loading newspaper trucks. He would have some chest heaviness associated with SOB. No palpitations of syncope.  He hasn't had any testing done since 2008 as he had no insurance, he currently got insured under [employer].   11/22/2014 - the patient is coming after 1 months, at the last visit he was in acute on chronic systolic heart failure, and he was started on Lasix 40 mg daily. He describes significant improvement in shortness of breath and paroxysmal nocturnal dyspnea as well as lower extremity edema. He has lost 7 pounds since the last visit. Denies any dizziness or syncope. Denies any palpitations or chest pain. In the meantime he was inquiring about being prescribed Viagra, we have prescribed that and educated him about not using it to gather with any nitrates. However patient hasn't been using it as at this point it is not affordable for him.  Home Medications  Prior to Admission medications   Medication Sig Start Date End Date Taking? Authorizing Provider  aspirin EC 325 MG tablet Take 325 mg by mouth daily.   Yes Historical Provider, MD  furosemide (LASIX) 40 MG  tablet Take 1 tablet (40 mg total) by mouth daily. 05/17/13  Yes Dorothy Spark, MD  lisinopril (PRINIVIL,ZESTRIL) 5 MG tablet Take 1 tablet (5 mg total) by mouth daily. 05/17/13  Yes Dorothy Spark, MD  metoprolol (LOPRESSOR) 50 MG tablet Take 50 mg by mouth daily.   Yes Historical Provider, MD    Family History  Family History  Problem Relation Age of Onset  . Cancer Mother     Social History  History   Social History  . Marital Status: Single    Spouse Name: N/A  . Number of Children: N/A  . Years of Education:  N/A   Occupational History  . 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 Smoking status: Current Every Day Smoker -- 1.00 packs/day for 33 years    Types: Cigarettes  . Smokeless tobacco: Never Used  . Alcohol Use: No     Comment: QUIT IN 2000  . Drug Use: No  . Sexual Activity: No   Other Topics Concern  . Not on file   Social History Narrative   Lives in [place], [place] with housemate.      Review of Systems, as per HPI, otherwise negative General:  No chills, fever, night sweats or weight changes.  Cardiovascular:  No chest pain, dyspnea on exertion, edema, orthopnea, palpitations, paroxysmal nocturnal dyspnea. Dermatological: No rash, lesions/masses Respiratory: No cough, dyspnea Urologic: No hematuria, dysuria Abdominal:   No nausea, vomiting, diarrhea, bright red blood per rectum, melena, or hematemesis Neurologic:  No visual changes, wkns, changes in mental status. All other systems reviewed and are otherwise negative except as noted above.  Physical Exam  Blood pressure 132/72, pulse 72, height 6' (1.829 m), weight 259 lb (117.482 kg), SpO2 98 %.  General: Pleasant, NAD Psych: Normal affect. Neuro: Alert and oriented X 3. Moves all extremities spontaneously. HEENT: Normal  Neck: Supple without bruits or JVD. Lungs:  Resp regular and unlabored, CTA. Heart: RRR no s3, s4, or murmurs. Abdomen: Soft, non-tender, non-distended, BS + x 4.  Extremities: No clubbing, cyanosis , mild B/L LE edema up to the knees. DP/PT/Radials 2+ and equal bilaterally.  Labs:  No results for input(s): CKTOTAL, CKMB, TROPONINI in the last 72 hours. Lab Results  Component Value Date   WBC 6.0 04/27/2014   HGB 14.8 04/27/2014   HCT 42.5 04/27/2014   MCV 85.5 04/27/2014   PLT 199 04/27/2014   No results for input(s): NA, K, CL, CO2, BUN, CREATININE, CALCIUM, PROT, BILITOT, ALKPHOS, ALT, AST, GLUCOSE in the last 168 hours.  Invalid input(s): LABALBU Lab Results    Component Value Date   CHOL  05/22/2010    173        ATP III CLASSIFICATION:  <200     mg/dL   Desirable  200-239  mg/dL   Borderline High  >=240    mg/dL   High          HDL 42 05/22/2010   LDLCALC * 05/22/2010    116        Total Cholesterol/HDL:CHD Risk Coronary Heart Disease Risk Table                     Men   Women  1/2 Average Risk   3.4   3.3  Average Risk       5.0   4.4  2 X Average Risk   9.6   7.1  3 X  Average Risk  23.4   11.0        Use the calculated Patient Ratio above and the CHD Risk Table to determine the patient's CHD Risk.        ATP III CLASSIFICATION (LDL):  <100     mg/dL   Optimal  100-129  mg/dL   Near or Above                    Optimal  130-159  mg/dL   Borderline  160-189  mg/dL   High  >190     mg/dL   Very High   TRIG 73 05/22/2010    Accessory Clinical Findings  Echocardiogram - 05/21/2010  - Left ventricle: The cavity size was severely dilated. Wall thickness was increased in a pattern of mild LVH. Systolic function was severely reduced. The estimated ejection fraction was in the range of 25% to 30%. Diffuse hypokinesis. Features are consistent with a pseudonormal left ventricular filling pattern, with concomitant abnormal relaxation and increased filling pressure (grade 2 diastolic dysfunction). - Aortic valve: Trivial regurgitation. - Mitral valve: Mild regurgitation. - Left atrium: The atrium was moderately dilated. - Pericardium, extracardiac: A trivial pericardial effusion was identified. There was a left pleural effusion.  ECG - SR, RAD, bi-atrial enlargement, lateral wall ischemia  Lexiscan nuclear stress test: 10/2014 Impression Exercise Capacity: Lexiscan with no exercise. BP Response: Normal blood pressure response. Clinical Symptoms: No chest pain. ECG Impression: No significant ST segment change suggestive of ischemia. Comparison with Prior Nuclear Study: Since prior study of 2008 the EF has fallen from 33%  to 26%.  Overall Impression: Low risk stress nuclear study. There is no evidence of ischemia. The LV is significantly dilated with low EF. Pattern is consistent with a dilated nonischemic cardiomyopathy.  LV Ejection Fraction: 26%. LV Wall Motion: Global hypokinesis.    Assessment & Plan  54 year old male   1. Acute on chronic combined chronic systolic and diastolic CHF - the patient is mildly fluid overloaded, we will increase lasix to 40 mg po BID. we will continue the same regimen, he stress test was negative for ischemia but showed chronic left ventricle dysfunction with LVEF of 26%. We will add spironolactone 25 mg daily to his regimen. Weight 266--> 259 lbs today  2. DOE - abnormal ECG - new lateral wall ischemia, Lexiscan nuclear stress test negative for ischemia.  3. Non-ischemic CMP - LVEF 25-30%, severely dilated LV, continue lisinopril, carvedilol, aspirin and lasix. At Aldactone as described above  4. Abnormal ECG with STD and negative T waves in V5-6, however the patient is non-ischemic and has negative stress test, I will continue medical management.   5. NIDDM - we will refill metformin 500 mg po bid  6. Erectile dysfunction - patient was prescribed Viagra, however unaffordable, history and contact his interest Co. there any cheaper options. He is educated about never using it with nitrates.  Follow up in 2 month. Check compressive metabolic profile today.   Dorothy Spark, MD, [facility] 11/22/2014, 11:15 AM

## 2015-01-26 ENCOUNTER — Ambulatory Visit (INDEPENDENT_AMBULATORY_CARE_PROVIDER_SITE_OTHER): Payer: No Typology Code available for payment source | Admitting: Cardiology

## 2015-01-26 ENCOUNTER — Telehealth: Payer: Self-pay | Admitting: *Deleted

## 2015-01-26 ENCOUNTER — Encounter: Payer: Self-pay | Admitting: Cardiology

## 2015-01-26 VITALS — BP 120/72 | HR 71 | Ht 72.0 in | Wt 258.1 lb

## 2015-01-26 DIAGNOSIS — R0609 Other forms of dyspnea: Secondary | ICD-10-CM

## 2015-01-26 DIAGNOSIS — I5042 Chronic combined systolic (congestive) and diastolic (congestive) heart failure: Secondary | ICD-10-CM

## 2015-01-26 DIAGNOSIS — I428 Other cardiomyopathies: Secondary | ICD-10-CM

## 2015-01-26 DIAGNOSIS — I429 Cardiomyopathy, unspecified: Secondary | ICD-10-CM

## 2015-01-26 DIAGNOSIS — R002 Palpitations: Secondary | ICD-10-CM

## 2015-01-26 LAB — COMPREHENSIVE METABOLIC PANEL
ALT: 13 U/L (ref 0–53)
AST: 13 U/L (ref 0–37)
Albumin: 3.6 g/dL (ref 3.5–5.2)
Alkaline Phosphatase: 79 U/L (ref 39–117)
BUN: 17 mg/dL (ref 6–23)
CO2: 28 mEq/L (ref 19–32)
Calcium: 8.8 mg/dL (ref 8.4–10.5)
Chloride: 101 mEq/L (ref 96–112)
Creatinine, Ser: 0.93 mg/dL (ref 0.40–1.50)
GFR: 89.57 mL/min (ref >60.00)
Glucose, Bld: 231 mg/dL — ABNORMAL HIGH (ref 70–99)
Potassium: 3.4 mEq/L — ABNORMAL LOW (ref 3.5–5.1)
Sodium: 134 mEq/L — ABNORMAL LOW (ref 135–145)
Total Bilirubin: 0.5 mg/dL (ref 0.2–1.2)
Total Protein: 6.8 g/dL (ref 6.0–8.3)

## 2015-01-26 LAB — BRAIN NATRIURETIC PEPTIDE: Pro B Natriuretic peptide (BNP): 447 pg/mL — ABNORMAL HIGH (ref 0.0–100.0)

## 2015-01-26 MED ORDER — POTASSIUM CHLORIDE CRYS ER 20 MEQ PO TBCR: 20.0000 meq | EXTENDED_RELEASE_TABLET | Freq: Every day | ORAL | Status: DC

## 2015-01-26 NOTE — Progress Notes (Signed)
Patient ID: David Bright, male   DOB: October 28, 1960, 54 y.o.   MRN: [id number]     Patient Name: David Bright Date of Encounter: 01/26/2015  Primary Care Provider:  No PCP Per Patient Primary Cardiologist:  Dorothy Spark (previously Dr Verl Blalock)  Problem List   Past Medical History  Diagnosis Date  . CHF (congestive heart failure)     Systolic and diastolic  . COPD (chronic obstructive pulmonary disease)   . Diabetes mellitus   . Hypertension   . Sleep apnea     Nocturnal CPAP  . Hyperlipidemia   . Non compliance with medical treatment   . Polysubstance abuse     History of quitting alcohol, marijuana and crack over 12 years ago  . Non-ischemic cardiomyopathy     Normal cath 2002  . NICM (nonischemic cardiomyopathy) 11/09/2011  . OSA on CPAP 11/09/2011  . HTN (hypertension) 11/09/2011  . DM (diabetes mellitus) 11/09/2011   Past Surgical History  Procedure Laterality Date  . None      Allergies  No Known Allergies  Chief complain: Dyspnea on exertion, palpitations.  HPI  A 54 year old male with h/o hypertension, NIDDM, OSA, hyperlipidemia and combined systolic and diastolic CHF. He has h/o non-ischemic cardiomyopathy. The patient hasn't followed in the last two years as he doesn't have medical insurance and cannot afford doctor's visits. As a consequence he ran out of some medications refill such as metformin, he doesn't have PCP. His last echocardiogram in 2011 showed severely dilated left ventricle with LVEF 25-30% and grade 2 diastolic dysfunction with elevated filling pressures. Normal RVSP.  He states that he has been doing fairly well, he gets on and off LE dema, but taking extra lasix takes care of it. He denies any cest pain, his DOE has been stable, NYHA II. He denies palpitations, syncope, PND or orthopnea.   04/19/2014 - the patient is coming after 6 months, he went to the ER on 02/10/2014 with CHF exacerbation, it has resolved with doubling dose of Lasix to 40 mg po  BID. 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 No orthopnea or PND.   10/20/2014 - the patient is coming after 6 months, he states that he feels some worsening LE edema and mildly worsening DOE. The patient at nights loading newspaper trucks. He would have some chest heaviness associated with SOB. No palpitations of syncope.  He hasn't had any testing done since 2008 as he had no insurance, he currently got insured under [employer].   11/22/2014 - the patient is coming after 1 months, at the last visit he was in acute on chronic systolic heart failure, and he was started on Lasix 40 mg daily. He describes significant improvement in shortness of breath and paroxysmal nocturnal dyspnea as well as lower extremity edema. He has lost 7 pounds since the last visit. Denies any dizziness or syncope. Denies any palpitations or chest pain. In the meantime he was inquiring about being prescribed Viagra, we have prescribed that and educated him about not using it to gather with any nitrates. However patient hasn't been using it as at this point it is not affordable for him.  01/26/2015 - 2 months follow up, he is overall doing better, LE edema has improved, he has occasional PND, improved DOE. No chest pain, he has noticed episodes of palpitations associated with dizziness, no syncope.  He is complaint with his meds, continues  to work.    Home Medications  Prior to Admission medications   Medication Sig Start Date End Date Taking? Authorizing Provider  aspirin EC 325 MG tablet Take 325 mg by mouth daily.   Yes Historical Provider, MD  furosemide (LASIX) 40 MG tablet Take 1 tablet (40 mg total) by mouth daily. 05/17/13  Yes Dorothy Spark, MD  lisinopril (PRINIVIL,ZESTRIL) 5 MG tablet Take 1 tablet (5 mg total) by mouth daily. 05/17/13  Yes Dorothy Spark, MD  metoprolol (LOPRESSOR) 50 MG tablet Take 50 mg by mouth daily.   Yes  Historical Provider, MD    Family History  Family History  Problem Relation Age of Onset  . Cancer Mother     Social History  History   Social History  . Marital Status: Single    Spouse Name: N/A  . Number of Children: N/A  . 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 Smoking status: Current Every Day Smoker -- 1.00 packs/day for 33 years    Types: Cigarettes  . Smokeless tobacco: Never Used  . Alcohol Use: No     Comment: QUIT IN 2000  . Drug Use: No  . Sexual Activity: No   Other Topics Concern  . Not on file   Social History Narrative   Lives in [place], [place] with housemate.      Review of Systems, as per HPI, otherwise negative General:  No chills, fever, night sweats or weight changes.  Cardiovascular:  No chest pain, dyspnea on exertion, edema, orthopnea, palpitations, paroxysmal nocturnal dyspnea. Dermatological: No rash, lesions/masses Respiratory: No cough, dyspnea Urologic: No hematuria, dysuria Abdominal:   No nausea, vomiting, diarrhea, bright red blood per rectum, melena, or hematemesis Neurologic:  No visual changes, wkns, changes in mental status. All other systems reviewed and are otherwise negative except as noted above.  Physical Exam  Blood pressure 120/72, pulse 71, height 6' (1.829 m), weight 258 lb 1.9 oz (117.082 kg), SpO2 98 %.  General: Pleasant, NAD Psych: Normal affect. Neuro: Alert and oriented X 3. Moves all extremities spontaneously. HEENT: Normal  Neck: Supple without bruits or JVD. Lungs:  Resp regular and unlabored, CTA. Heart: RRR no s3, s4, or murmurs. Abdomen: Soft, non-tender, non-distended, BS + x 4.  Extremities: No clubbing, cyanosis , mild B/L LE edema up to the knees. DP/PT/Radials 2+ and equal bilaterally.  Labs:  No results for input(s): CKTOTAL, CKMB, TROPONINI in the last 72 hours. Lab Results  Component Value Date   WBC 6.0 04/27/2014     HGB 14.8 04/27/2014   HCT 42.5 04/27/2014   MCV 85.5 04/27/2014   PLT 199 04/27/2014   No results for input(s): NA, K, CL, CO2, BUN, CREATININE, CALCIUM, PROT, BILITOT, ALKPHOS, ALT, AST, GLUCOSE in the last 168 hours.  Invalid input(s): LABALBU Lab Results  Component Value Date   CHOL  05/22/2010    173        ATP III CLASSIFICATION:  <200     mg/dL   Desirable  200-239  mg/dL   Borderline High  >=240    mg/dL   High          HDL 42 05/22/2010   LDLCALC * 05/22/2010    116        Total Cholesterol/HDL:CHD Risk Coronary Heart Disease Risk Table  Men   Women  1/2 Average Risk   3.4   3.3  Average Risk       5.0   4.4  2 X Average Risk   9.6   7.1  3 X Average Risk  23.4   11.0        Use the calculated Patient Ratio above and the CHD Risk Table to determine the patient's CHD Risk.        ATP III CLASSIFICATION (LDL):  <100     mg/dL   Optimal  100-129  mg/dL   Near or Above                    Optimal  130-159  mg/dL   Borderline  160-189  mg/dL   High  >190     mg/dL   Very High   TRIG 73 05/22/2010    Accessory Clinical Findings  Echocardiogram - 05/21/2010  - Left ventricle: The cavity size was severely dilated. Wall thickness was increased in a pattern of mild LVH. Systolic function was severely reduced. The estimated ejection fraction was in the range of 25% to 30%. Diffuse hypokinesis. Features are consistent with a pseudonormal left ventricular filling pattern, with concomitant abnormal relaxation and increased filling pressure (grade 2 diastolic dysfunction). - Aortic valve: Trivial regurgitation. - Mitral valve: Mild regurgitation. - Left atrium: The atrium was moderately dilated. - Pericardium, extracardiac: A trivial pericardial effusion was identified. There was a left pleural effusion.  ECG - SR, RAD, bi-atrial enlargement, lateral wall ischemia  Lexiscan nuclear stress test: 10/2014 Impression Exercise Capacity: Lexiscan  with no exercise. BP Response: Normal blood pressure response. Clinical Symptoms: No chest pain. ECG Impression: No significant ST segment change suggestive of ischemia. Comparison with Prior Nuclear Study: Since prior study of 2008 the EF has fallen from 33% to 26%.  Overall Impression: Low risk stress nuclear study. There is no evidence of ischemia. The LV is significantly dilated with low EF. Pattern is consistent with a dilated nonischemic cardiomyopathy.  LV Ejection Fraction: 26%. LV Wall Motion: Global hypokinesis.    Assessment & Plan  54 year old male   1. Acute on chronic combined chronic systolic and diastolic CHF - the patient appera, we will increase lasix to 40 mg po BID. we will continue the same regimen, he stress test was negative for ischemia but showed chronic left ventricle dysfunction with LVEF of 26%. We will add spironolactone 25 mg daily to his regimen. Weight 266--> 259 --> 258 lbs today. He has new symptomatic palpitations. We will refer ofr an ICD implantation - possibly BiV-ICD - there is non-specific intraventricular conduction delay - not full LBBB.  2. DOE - improved - Lexiscan nuclear stress test negative for ischemia.  3. Non-ischemic CMP - LVEF 25-30%, severely dilated LV, continue lisinopril, carvedilol, aspirin and lasix. At Aldactone as described above.  4. Abnormal ECG with STD and negative T waves in V5-6, however the patient is non-ischemic and has negative stress test, I will continue medical management.   5. NIDDM - we will refill metformin 500 mg po bid  6. Erectile dysfunction - patient was prescribed Viagra, however unaffordable, history and contact his interest Co. there any cheaper options. He is educated about never using it with nitrates.  Follow up in 3 month. Check compressive metabolic, BNP profile today.   Dorothy Spark, MD, [facility] 01/26/2015, 12:14 PM

## 2015-01-26 NOTE — Patient Instructions (Signed)
Medication Instructions:   Your physician recommends that you continue on your current medications as directed. Please refer to the Current Medication list given to you today.    Labwork:  TODAY---CMET AND BNP    Testing/Procedures:  Your physician has requested that you have an echocardiogram. Echocardiography is a painless test that uses sound waves to create images of your heart. It provides your doctor with information about the size and shape of your heart and how well your heart's chambers and valves are working. This procedure takes approximately one hour. There are no restrictions for this procedure.    You have been referred to [facility] ICD IMPLANTATION    Follow-Up:  3 MONTHS WITH DR [name]

## 2015-01-26 NOTE — Telephone Encounter (Signed)
-----   Message from Dorothy Spark, MD sent at 01/26/2015  4:20 PM EDT ----- Can you please start him on lasix 20 mhg po daily? Thank you

## 2015-01-26 NOTE — Telephone Encounter (Signed)
Per Dr [name] pts labs revealed that his K was 3.4 and she would like for him to start Lasix 20 mg po daily.  Verbal clarification order obtained from Dr [name] and correction made:  Pt should continue his current dose of lasix but start taking KCL 20 mEq po daily for low K.  Informed the pt of this.  Confirmed the pharmacy of choice with the pt.  Pt verbalized understanding and agrees with this plan.

## 2015-02-08 ENCOUNTER — Encounter: Payer: Self-pay | Admitting: Internal Medicine

## 2015-02-08 ENCOUNTER — Other Ambulatory Visit: Payer: Self-pay

## 2015-02-08 ENCOUNTER — Ambulatory Visit (INDEPENDENT_AMBULATORY_CARE_PROVIDER_SITE_OTHER): Payer: No Typology Code available for payment source | Admitting: Internal Medicine

## 2015-02-08 ENCOUNTER — Ambulatory Visit (HOSPITAL_COMMUNITY): Payer: Medicaid Other | Attending: Cardiovascular Disease

## 2015-02-08 ENCOUNTER — Institutional Professional Consult (permissible substitution): Payer: No Typology Code available for payment source | Admitting: Internal Medicine

## 2015-02-08 VITALS — BP 118/70 | HR 75 | Ht 72.0 in | Wt 258.8 lb

## 2015-02-08 DIAGNOSIS — Z720 Tobacco use: Secondary | ICD-10-CM | POA: Insufficient documentation

## 2015-02-08 DIAGNOSIS — I428 Other cardiomyopathies: Secondary | ICD-10-CM

## 2015-02-08 DIAGNOSIS — I10 Essential (primary) hypertension: Secondary | ICD-10-CM | POA: Diagnosis not present

## 2015-02-08 DIAGNOSIS — Z87898 Personal history of other specified conditions: Secondary | ICD-10-CM

## 2015-02-08 DIAGNOSIS — R0609 Other forms of dyspnea: Secondary | ICD-10-CM | POA: Diagnosis not present

## 2015-02-08 DIAGNOSIS — R002 Palpitations: Secondary | ICD-10-CM

## 2015-02-08 DIAGNOSIS — E119 Type 2 diabetes mellitus without complications: Secondary | ICD-10-CM | POA: Diagnosis not present

## 2015-02-08 DIAGNOSIS — I5042 Chronic combined systolic (congestive) and diastolic (congestive) heart failure: Secondary | ICD-10-CM

## 2015-02-08 DIAGNOSIS — I429 Cardiomyopathy, unspecified: Secondary | ICD-10-CM

## 2015-02-08 DIAGNOSIS — Z8669 Personal history of other diseases of the nervous system and sense organs: Secondary | ICD-10-CM

## 2015-02-08 DIAGNOSIS — R5382 Chronic fatigue, unspecified: Secondary | ICD-10-CM

## 2015-02-08 DIAGNOSIS — E785 Hyperlipidemia, unspecified: Secondary | ICD-10-CM | POA: Insufficient documentation

## 2015-02-08 DIAGNOSIS — Z01812 Encounter for preprocedural laboratory examination: Secondary | ICD-10-CM

## 2015-02-08 DIAGNOSIS — R400 Somnolence: Secondary | ICD-10-CM

## 2015-02-08 DIAGNOSIS — G4710 Hypersomnia, unspecified: Secondary | ICD-10-CM

## 2015-02-08 NOTE — Patient Instructions (Addendum)
Medication Instructions:  Your physician recommends that you continue on your current medications as directed. Please refer to the Current Medication list given to you today.  Labwork: Pre procedure labs to be scheduled when I call to arrange catheterization and ICD implant  Testing/Procedures: Your physician has recommended that you have a sleep study. This test records several body functions during sleep, including: brain activity, eye movement, oxygen and carbon dioxide blood levels, heart rate and rhythm, breathing rate and rhythm, the flow of air through your mouth and nose, snoring, body muscle movements, and chest and belly movement.  Someone will call you to arrange this test.  Your physician has requested that you have a cardiac catheterization. Cardiac catheterization is used to diagnose and/or treat various heart conditions. Doctors may recommend this procedure for a number of different reasons. The most common reason is to evaluate chest pain. Chest pain can be a symptom of coronary artery disease (CAD), and cardiac catheterization can show whether plaque is narrowing or blocking your heart's arteries. This procedure is also used to evaluate the valves, as well as measure the blood flow and oxygen levels in different parts of your heart. For further information please visit [email or web address]. Please follow instruction sheet, as given.  Your physician has recommended that you have a defibrillator inserted. An implantable cardioverter defibrillator (ICD) is a small device that is placed in your chest or, in rare cases, your abdomen. This device uses electrical pulses or shocks to help control life-threatening, irregular heartbeats that could lead the heart to suddenly stop beating (sudden cardiac arrest). Leads are attached to the ICD that goes into your heart. This is done in the hospital and usually requires an overnight stay. Please see the instruction sheet given to you today for more  information.  Follow-Up: Will arrange follow up once I schedule your procedures.  Thank you for choosing [facility]!!

## 2015-02-08 NOTE — Progress Notes (Signed)
ELECTROPHYSIOLOGY CONSULT NOTE  Patient ID: David Bright, MRN: [id number], DOB/AGE: 1961/05/04 54 y.o. Admit date: (Not on file) Date of Consult: 02/08/2015  Primary Physician: No PCP Per Patient Primary Cardiologist: KN  Chief Complaint: ICD   HPI David Bright is a 54 y.o. male referred for consideration of an ICD  He has a history of a nonischemic cardiomyopathy with catheterization remotely demonstrating no obstruction. Is was done 2003.   He's had recurrent hospitalizations for heart failure over the last year. These occur mostly in the context of medication noncompliance and interestingly he was actually off medications for many many years.  More recently he has done a better job with medications. He has intermittent dyspnea on exertion which is addressed by adjusting his diuretics on his own. He takes extra Lasix most days. He has not been having chest pain. He is aware of salt in his diet. He does pretty well.  He has some palpitations associated with presyncope; these are associated with a sensation of his heart beat stopping.   *Echocardiogram 10/01/2009 demonstrated severe LV dysfunction with an EF of 20-30% Myoview 3/16 demonstrated ejection fraction of 26%.  ECG was strikingly abnormal with biatrial enlargement and significant right axis deviation. Echo was ordered and is pending  Past Medical History  Diagnosis Date  . CHF (congestive heart failure)     Systolic and diastolic  . COPD (chronic obstructive pulmonary disease)   . Diabetes mellitus   . Hypertension   . Sleep apnea     Nocturnal CPAP  . Hyperlipidemia   . Non compliance with medical treatment   . Polysubstance abuse     History of quitting alcohol, marijuana and crack over 12 years ago  . Non-ischemic cardiomyopathy     Normal cath 2002  . NICM (nonischemic cardiomyopathy) 11/09/2011  . OSA on CPAP 11/09/2011  . HTN (hypertension) 11/09/2011  . DM (diabetes mellitus) 11/09/2011       Surgical History:  Past Surgical History  Procedure Laterality Date  . None       Home Meds: Prior to Admission medications   Medication Sig Start Date End Date Taking? Authorizing Provider  aspirin EC 81 MG tablet Take 1 tablet (81 mg total) by mouth daily. 10/19/13  Yes Dorothy Spark, MD  carvedilol (COREG) 12.5 MG tablet Take 12.5 mg by mouth 2 (two) times daily with a meal.    Yes Historical Provider, MD  furosemide (LASIX) 40 MG tablet Take 1 tablet (40 mg total) by mouth 2 (two) times daily. 10/20/14  Yes Dorothy Spark, MD  lisinopril (PRINIVIL,ZESTRIL) 5 MG tablet Take 1 tablet (5 mg total) by mouth daily. 11/16/14  Yes Dorothy Spark, MD  potassium chloride SA (K-DUR,KLOR-CON) 20 MEQ tablet Take 1 tablet (20 mEq total) by mouth daily. 01/26/15  Yes Dorothy Spark, MD  spironolactone (ALDACTONE) 25 MG tablet Take 1 tablet (25 mg total) by mouth daily. 11/22/14  Yes Dorothy Spark, MD      Allergies: No Known Allergies  History   Social History  . Marital Status: Single    Spouse Name: N/A  . Number of Children: N/A  . 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 Smoking status: Current Every Day Smoker -- 1.00 packs/day for 33 years    Types: Cigarettes  . Smokeless tobacco: Never Used  . Alcohol Use: No  Comment: QUIT IN 2000  . Drug Use: No  . Sexual Activity: No   Other Topics Concern  . Not on file   Social History Narrative   Lives in [place], [place] with housemate.      Family History  Problem Relation Age of Onset  . Cancer Mother      ROS:  Please see the history of present illness.     All other systems reviewed and negative.    Physical Exam:   Blood pressure 118/70, pulse 75, height 6' (1.829 m), weight 258 lb 12.8 oz (117.391 kg). General: Well developed, well nourished male in no acute distress. Head: Normocephalic, atraumatic, sclera non-icteric, no  xanthomas, nares are without discharge. EENT: normal Lymph Nodes:  none Back: without scoliosis/kyphosis, no CVA tendersness Neck: Negative for carotid bruits. JVD 10 cm with positive HJR  Lungs: Clear bilaterally to auscultation without wheezes, rales, or rhonchi. Breathing is unlabored. Heart: RRR with S1 S2.And an elevated P2 P2 was palpable.   2/6 systolic murmur at the left upper sternal border and a 2/6 holosystolic murmur at the apex , rubs, or gallops appreciated. Abdomen: Soft, non-tender, non-distended with normoactive bowel sounds. No hepatomegaly. No rebound/guarding. No obvious abdominal masses. Msk:  Strength and tone appear normal for age. Extremities: No clubbing or cyanosis.  Tr edema.  Distal pedal pulses are 2+ and equal bilaterally. Skin: Warm and Dry Neuro: Alert and oriented X 3. CN III-XII intact Grossly normal sensory and motor function . Psych:  Responds to questions appropriately with a normal affect.      Labs: Cardiac Enzymes No results for input(s): CKTOTAL, CKMB, TROPONINI in the last 72 hours. CBC Lab Results  Component Value Date   WBC 6.0 04/27/2014   HGB 14.8 04/27/2014   HCT 42.5 04/27/2014   MCV 85.5 04/27/2014   PLT 199 04/27/2014   PROTIME: No results for input(s): LABPROT, INR in the last 72 hours. Chemistry No results for input(s): NA, K, CL, CO2, BUN, CREATININE, CALCIUM, PROT, BILITOT, ALKPHOS, ALT, AST, GLUCOSE in the last 168 hours.  Invalid input(s): LABALBU Lipids Lab Results  Component Value Date   CHOL  05/22/2010    173        ATP III CLASSIFICATION:  <200     mg/dL   Desirable  200-239  mg/dL   Borderline High  >=240    mg/dL   High          HDL 42 05/22/2010   LDLCALC * 05/22/2010    116        Total Cholesterol/HDL:CHD Risk Coronary Heart Disease Risk Table                     Men   Women  1/2 Average Risk   3.4   3.3  Average Risk       5.0   4.4  2 X Average Risk   9.6   7.1  3 X Average Risk  23.4   11.0         Use the calculated Patient Ratio above and the CHD Risk Table to determine the patient's CHD Risk.        ATP III CLASSIFICATION (LDL):  <100     mg/dL   Optimal  100-129  mg/dL   Near or Above                    Optimal  130-159  mg/dL   Borderline  160-189  mg/dL   High  >190     mg/dL   Very High   TRIG 73 05/22/2010   BNP PRO B NATRIURETIC PEPTIDE (BNP)  Date/Time Value Ref Range Status  01/26/2015 12:53 PM 447.0* 0.0 - 100.0 pg/mL Final  11/22/2014 12:13 PM 236.0* 0.0 - 100.0 pg/mL Final  04/27/2014 05:50 AM 1567.0* 0 - 125 pg/mL Final  02/10/2014 03:04 PM 1044.0* 0 - 125 pg/mL Final   Thyroid Function Tests: No results for input(s): TSH, T4TOTAL, T3FREE, THYROIDAB in the last 72 hours.  Invalid input(s): FREET3    Miscellaneous No results found for: DDIMER  Radiology/Studies:  No results found.  EKG: Sinus rhythm at 75 Intervals 19/12/45 Axis CLVIII Biatrial enlargement   Assessment and Plan:  Obstructive sleep apnea-previously treated  Cardiomyopathy presumed nonischemic with some regional wall motion abnormalities ejection fraction 15-20%  Right axis deviation/biatrial enlargement  Congestive heart failure-chronic-systolic  Presyncope   The patient has long-standing persistent left ventricular dysfunction with remote catheterization demonstrating no significant obstruction. Echocardiogram today was reviewed personally with Dr. [name]. It shows significant left sided chamber enlargement; the right sided chambers are surprisingly small. Tricuspid regurgitation was insufficient to measure pulmonary pressures. There was some discrete wall motion abnormality. Hence, the initial step would be catheterization both right and left to look for obstructive disease as well as pulmonary pressures. In the event that revascularization was indicated that would be undertaken with subsequent reassessment Of left ventricular function with decisions regarding ICD implantation  deferred.  He is mildly volume overloaded. He has been doing a good job at a adjusting his diuretics on an as-needed basis.  He has a history of previously treated sleep apnea. Given the abnormal echo, will undertake a repeat sleep study.  He would be a reasonable candidate to switch from his ACE inhibitor to [facility]  Revascularization is not indicated, we would undertake ICD implantation.  Have reviewed the potential benefits and risks of ICD implantation including but not limited to death, perforation of heart or lung, lead dislodgement, infection,  device malfunction and inappropriate shocks.  The patient and family express understanding  and are willing to proceed.         Virl Axe

## 2015-02-09 ENCOUNTER — Encounter: Payer: Self-pay | Admitting: *Deleted

## 2015-02-09 ENCOUNTER — Telehealth: Payer: Self-pay | Admitting: Internal Medicine

## 2015-02-09 NOTE — Telephone Encounter (Signed)
Left message to call back in regards to the pts echo results.  Left detailed message on pts confirmed VM that Dr Caryl Comes discussed his echo results with Dr [name] and the plan was discussed with the pt at yesterday's OV with Dr Caryl Comes for cardiac cath and ICD.  Left message for the pt to call back with any additional questions.

## 2015-02-09 NOTE — Telephone Encounter (Signed)
Follow up    Please call [phone] when calling pt back with his Echo test results.

## 2015-02-10 ENCOUNTER — Telehealth: Payer: Self-pay | Admitting: Internal Medicine

## 2015-02-10 NOTE — Telephone Encounter (Signed)
Informed patient I would contact him next week to arrange procedures. He is agreeable

## 2015-02-10 NOTE — Telephone Encounter (Signed)
New problem ° ° °Pt returning call from nurse. °

## 2015-02-15 NOTE — Telephone Encounter (Signed)
Patient returns my call - would like to schedule for 7/25.  Aware that I will look into this and call him back in next day or two to discuss/review instructions.

## 2015-02-22 ENCOUNTER — Telehealth: Payer: Self-pay | Admitting: Cardiology

## 2015-02-22 NOTE — Telephone Encounter (Signed)
New Message   Pt is calling to speak to the Rn to see information on Dietary

## 2015-02-22 NOTE — Telephone Encounter (Signed)
Pt calling to follow-up with Sherri RN with Dr Caryl Comes. Pt states he couldn't remember if he was to call back this week or if she would call him back this week in regards to getting his cath and ICD implant. procedure scheduled.  Informed the pt that based on Sherri's note, telephone conversation with him on 7/6, he wanted to schedule his procedures for 7/25, and she made the pt aware that she would look into this and call him back in next day or two to discuss/review instructions with him. Informed the pt that she is out of the office today, but will return back tomorrow.  Informed the pt that she is aware to call him, and has not forgot. Informed the pt she has been working on scheduling his procedures.  Informed the pt that I will route this message to her for further review and follow-up, as she indicated to him on 7/6 telephone conversation.  Pt verbalized understanding and agrees with this plan.

## 2015-02-23 NOTE — Telephone Encounter (Signed)
Previously scheduled cath and ICD for 7/25. Reviewed letter of instructions with patient and left at front desk for him to pick up Monday 7/18. Pre procedure lab work on 7/18. Wound check 8/4. Patient verbalized understanding and agreeable to plan.

## 2015-02-27 ENCOUNTER — Encounter: Payer: Self-pay | Admitting: *Deleted

## 2015-02-27 ENCOUNTER — Other Ambulatory Visit (INDEPENDENT_AMBULATORY_CARE_PROVIDER_SITE_OTHER): Payer: Self-pay | Admitting: *Deleted

## 2015-02-27 DIAGNOSIS — Z01812 Encounter for preprocedural laboratory examination: Secondary | ICD-10-CM

## 2015-02-27 DIAGNOSIS — I428 Other cardiomyopathies: Secondary | ICD-10-CM

## 2015-02-27 DIAGNOSIS — I429 Cardiomyopathy, unspecified: Secondary | ICD-10-CM

## 2015-02-27 DIAGNOSIS — I5042 Chronic combined systolic (congestive) and diastolic (congestive) heart failure: Secondary | ICD-10-CM

## 2015-02-27 LAB — BASIC METABOLIC PANEL
BUN: 14 mg/dL (ref 6–23)
CO2: 28 meq/L (ref 19–32)
CREATININE: 0.85 mg/dL (ref 0.40–1.50)
Calcium: 8.9 mg/dL (ref 8.4–10.5)
Chloride: 103 mEq/L (ref 96–112)
GFR: 99.33 mL/min (ref >60.00)
GLUCOSE: 182 mg/dL — AB (ref 70–99)
Potassium: 3.3 mEq/L — ABNORMAL LOW (ref 3.5–5.1)
Sodium: 140 mEq/L (ref 135–145)

## 2015-02-27 LAB — CBC WITH DIFFERENTIAL/PLATELET
BASOS PCT: 0.6 % (ref 0.0–3.0)
Basophils Absolute: 0 10*3/uL (ref 0.0–0.1)
Eosinophils Absolute: 0.1 10*3/uL (ref 0.0–0.7)
Eosinophils Relative: 2.6 % (ref 0.0–5.0)
HCT: 42.8 % (ref 39.0–52.0)
HEMOGLOBIN: 14.3 g/dL (ref 13.0–17.0)
LYMPHS ABS: 2.1 10*3/uL (ref 0.7–4.0)
LYMPHS PCT: 38.8 % (ref 12.0–46.0)
MCHC: 33.3 g/dL (ref 30.0–36.0)
MCV: 88.8 fl (ref 78.0–100.0)
MONO ABS: 0.4 10*3/uL (ref 0.1–1.0)
Monocytes Relative: 6.9 % (ref 3.0–12.0)
Neutro Abs: 2.7 10*3/uL (ref 1.4–7.7)
Neutrophils Relative %: 51.1 % (ref 43.0–77.0)
PLATELETS: 174 10*3/uL (ref 150.0–400.0)
RBC: 4.82 Mil/uL (ref 4.22–5.81)
RDW: 14.1 % (ref 11.5–15.5)
WBC: 5.3 10*3/uL (ref 4.0–10.5)

## 2015-03-01 ENCOUNTER — Other Ambulatory Visit: Payer: Self-pay | Admitting: Internal Medicine

## 2015-03-01 DIAGNOSIS — I420 Dilated cardiomyopathy: Secondary | ICD-10-CM

## 2015-03-06 ENCOUNTER — Encounter (HOSPITAL_COMMUNITY): Payer: Self-pay | Admitting: Interventional Cardiology

## 2015-03-06 ENCOUNTER — Encounter (HOSPITAL_COMMUNITY)
Admission: RE | Disposition: A | Discharge status: Home / Self Care | Payer: Self-pay | Source: Ambulatory Visit | Attending: Interventional Cardiology

## 2015-03-06 ENCOUNTER — Ambulatory Visit (HOSPITAL_COMMUNITY)
Admission: RE | Admit: 2015-03-06 | Discharge: 2015-03-07 | Disposition: A | Discharge status: Home / Self Care | Payer: Medicaid Other | Source: Ambulatory Visit | Attending: Interventional Cardiology | Admitting: Interventional Cardiology

## 2015-03-06 DIAGNOSIS — J449 Chronic obstructive pulmonary disease, unspecified: Secondary | ICD-10-CM | POA: Diagnosis present

## 2015-03-06 DIAGNOSIS — I428 Other cardiomyopathies: Secondary | ICD-10-CM

## 2015-03-06 DIAGNOSIS — I420 Dilated cardiomyopathy: Secondary | ICD-10-CM

## 2015-03-06 DIAGNOSIS — E119 Type 2 diabetes mellitus without complications: Secondary | ICD-10-CM | POA: Diagnosis not present

## 2015-03-06 DIAGNOSIS — Z7982 Long term (current) use of aspirin: Secondary | ICD-10-CM | POA: Diagnosis not present

## 2015-03-06 DIAGNOSIS — R0609 Other forms of dyspnea: Secondary | ICD-10-CM | POA: Diagnosis present

## 2015-03-06 DIAGNOSIS — I5042 Chronic combined systolic (congestive) and diastolic (congestive) heart failure: Secondary | ICD-10-CM | POA: Diagnosis not present

## 2015-03-06 DIAGNOSIS — E785 Hyperlipidemia, unspecified: Secondary | ICD-10-CM | POA: Diagnosis not present

## 2015-03-06 DIAGNOSIS — G4733 Obstructive sleep apnea (adult) (pediatric): Secondary | ICD-10-CM | POA: Diagnosis present

## 2015-03-06 DIAGNOSIS — I429 Cardiomyopathy, unspecified: Secondary | ICD-10-CM | POA: Diagnosis present

## 2015-03-06 DIAGNOSIS — I5022 Chronic systolic (congestive) heart failure: Secondary | ICD-10-CM

## 2015-03-06 DIAGNOSIS — Z959 Presence of cardiac and vascular implant and graft, unspecified: Secondary | ICD-10-CM

## 2015-03-06 DIAGNOSIS — F17210 Nicotine dependence, cigarettes, uncomplicated: Secondary | ICD-10-CM | POA: Insufficient documentation

## 2015-03-06 DIAGNOSIS — R55 Syncope and collapse: Secondary | ICD-10-CM | POA: Diagnosis not present

## 2015-03-06 DIAGNOSIS — I10 Essential (primary) hypertension: Secondary | ICD-10-CM | POA: Diagnosis present

## 2015-03-06 DIAGNOSIS — I2510 Atherosclerotic heart disease of native coronary artery without angina pectoris: Secondary | ICD-10-CM

## 2015-03-06 DIAGNOSIS — Z9989 Dependence on other enabling machines and devices: Secondary | ICD-10-CM

## 2015-03-06 HISTORY — PX: CARDIAC CATHETERIZATION: SHX172

## 2015-03-06 HISTORY — DX: Type 2 diabetes mellitus without complications: E11.9

## 2015-03-06 HISTORY — PX: EP IMPLANTABLE DEVICE: SHX172B

## 2015-03-06 LAB — PROTIME-INR
INR: 1.1 (ref 0.00–1.49)
Prothrombin Time: 14.4 seconds (ref 11.6–15.2)

## 2015-03-06 LAB — SURGICAL PCR SCREEN
MRSA, PCR: NEGATIVE
STAPHYLOCOCCUS AUREUS: POSITIVE — AB

## 2015-03-06 LAB — GLUCOSE, CAPILLARY
Glucose-Capillary: 147 mg/dL — ABNORMAL HIGH (ref 65–99)
Glucose-Capillary: 169 mg/dL — ABNORMAL HIGH (ref 65–99)
Glucose-Capillary: 253 mg/dL — ABNORMAL HIGH (ref 65–99)

## 2015-03-06 LAB — POCT I-STAT, CHEM 8
BUN: 19 mg/dL (ref 6–20)
Calcium, Ion: 1.13 mmol/L (ref 1.12–1.23)
Chloride: 100 mmol/L — ABNORMAL LOW (ref 101–111)
Creatinine, Ser: 0.9 mg/dL (ref 0.61–1.24)
Glucose, Bld: 149 mg/dL — ABNORMAL HIGH (ref 65–99)
HCT: 45 % (ref 39.0–52.0)
Hemoglobin: 15.3 g/dL (ref 13.0–17.0)
Potassium: 3.3 mmol/L — ABNORMAL LOW (ref 3.5–5.1)
Sodium: 142 mmol/L (ref 135–145)
TCO2: 27 mmol/L (ref 0–100)

## 2015-03-06 LAB — POCT I-STAT 3, VENOUS BLOOD GAS (G3P V)
Acid-Base Excess: 3 mmol/L — ABNORMAL HIGH (ref 0.0–2.0)
BICARBONATE: 29.2 meq/L — AB (ref 20.0–24.0)
O2 SAT: 68 %
PH VEN: 7.363 — AB (ref 7.250–7.300)
TCO2: 31 mmol/L (ref 0–100)
pCO2, Ven: 51.4 mmHg — ABNORMAL HIGH (ref 45.0–50.0)
pO2, Ven: 37 mmHg (ref 30.0–45.0)

## 2015-03-06 LAB — POCT I-STAT 3, ART BLOOD GAS (G3+)
ACID-BASE EXCESS: 5 mmol/L — AB (ref 0.0–2.0)
BICARBONATE: 31 meq/L — AB (ref 20.0–24.0)
O2 SAT: 96 %
PO2 ART: 83 mmHg (ref 80.0–100.0)
TCO2: 33 mmol/L (ref 0–100)
pCO2 arterial: 51.8 mmHg — ABNORMAL HIGH (ref 35.0–45.0)
pH, Arterial: 7.385 (ref 7.350–7.450)

## 2015-03-06 SURGERY — ICD IMPLANT
Anesthesia: LOCAL

## 2015-03-06 SURGERY — RIGHT/LEFT HEART CATH AND CORONARY ANGIOGRAPHY
Anesthesia: LOCAL

## 2015-03-06 MED ORDER — MIDAZOLAM HCL 5 MG/5ML IJ SOLN
INTRAMUSCULAR | Status: AC
  Filled 2015-03-06: qty 5

## 2015-03-06 MED ORDER — VERAPAMIL HCL 2.5 MG/ML IV SOLN
INTRAVENOUS | Status: AC
Start: 2015-03-06 — End: 2015-03-06
  Filled 2015-03-06: qty 2

## 2015-03-06 MED ORDER — CEFAZOLIN SODIUM 1-5 GM-% IV SOLN
1.0000 g | Freq: Four times a day (QID) | INTRAVENOUS | Status: AC
  Administered 2015-03-06 – 2015-03-07 (×3): 1 g via INTRAVENOUS
  Filled 2015-03-06 (×3): qty 50

## 2015-03-06 MED ORDER — SPIRONOLACTONE 25 MG PO TABS
25.0000 mg | ORAL_TABLET | Freq: Every day | ORAL | Status: DC
  Administered 2015-03-07: 25 mg via ORAL
  Filled 2015-03-06 (×2): qty 1

## 2015-03-06 MED ORDER — ASPIRIN EC 81 MG PO TBEC
81.0000 mg | DELAYED_RELEASE_TABLET | Freq: Every day | ORAL | Status: DC
  Administered 2015-03-06 – 2015-03-07 (×2): 81 mg via ORAL
  Filled 2015-03-06 (×2): qty 1

## 2015-03-06 MED ORDER — SODIUM CHLORIDE 0.9 % IV SOLN: 250.0000 mL | INTRAVENOUS | Status: DC | PRN

## 2015-03-06 MED ORDER — FENTANYL CITRATE (PF) 100 MCG/2ML IJ SOLN
INTRAMUSCULAR | Status: AC
  Filled 2015-03-06: qty 2

## 2015-03-06 MED ORDER — SODIUM CHLORIDE 0.9 % IV SOLN
INTRAVENOUS | Status: AC
  Administered 2015-03-06: 15:00:00 via INTRAVENOUS

## 2015-03-06 MED ORDER — OXYCODONE-ACETAMINOPHEN 5-325 MG PO TABS: 1.0000 | ORAL_TABLET | ORAL | Status: DC | PRN

## 2015-03-06 MED ORDER — LIDOCAINE HCL (PF) 1 % IJ SOLN
INTRAMUSCULAR | Status: DC | PRN
  Administered 2015-03-06: 08:00:00

## 2015-03-06 MED ORDER — FENTANYL CITRATE (PF) 100 MCG/2ML IJ SOLN
INTRAMUSCULAR | Status: DC | PRN
  Administered 2015-03-06: 50 ug via INTRAVENOUS

## 2015-03-06 MED ORDER — YOU HAVE A PACEMAKER BOOK
Freq: Once | Status: AC
  Administered 2015-03-07: 02:00:00
  Filled 2015-03-06: qty 1

## 2015-03-06 MED ORDER — ACETAMINOPHEN 325 MG PO TABS
325.0000 mg | ORAL_TABLET | ORAL | Status: DC | PRN
  Administered 2015-03-06 – 2015-03-07 (×2): 650 mg via ORAL
  Filled 2015-03-06 (×2): qty 2

## 2015-03-06 MED ORDER — SODIUM CHLORIDE 0.9 % IV SOLN
INTRAVENOUS | Status: DC
  Administered 2015-03-06: 13:00:00 via INTRAVENOUS

## 2015-03-06 MED ORDER — HEPARIN SODIUM (PORCINE) 1000 UNIT/ML IJ SOLN
INTRAMUSCULAR | Status: AC
  Filled 2015-03-06: qty 1

## 2015-03-06 MED ORDER — MIDAZOLAM HCL 2 MG/2ML IJ SOLN
INTRAMUSCULAR | Status: DC | PRN
  Administered 2015-03-06 (×2): 1 mg via INTRAVENOUS

## 2015-03-06 MED ORDER — FUROSEMIDE 40 MG PO TABS
40.0000 mg | ORAL_TABLET | Freq: Two times a day (BID) | ORAL | Status: DC
  Administered 2015-03-06: 40 mg via ORAL
  Filled 2015-03-06 (×2): qty 1

## 2015-03-06 MED ORDER — CEFAZOLIN SODIUM-DEXTROSE 2-3 GM-% IV SOLR
INTRAVENOUS | Status: AC
  Filled 2015-03-06: qty 50

## 2015-03-06 MED ORDER — SODIUM CHLORIDE 0.9 % IJ SOLN
3.0000 mL | Freq: Two times a day (BID) | INTRAMUSCULAR | Status: DC
  Administered 2015-03-07: 3 mL via INTRAVENOUS

## 2015-03-06 MED ORDER — SODIUM CHLORIDE 0.9 % IJ SOLN: 3.0000 mL | Freq: Two times a day (BID) | INTRAMUSCULAR | Status: DC

## 2015-03-06 MED ORDER — SODIUM CHLORIDE 0.9 % WEIGHT BASED INFUSION
1.0000 mL/kg/h | INTRAVENOUS | Status: DC
  Administered 2015-03-06: 1 mL/kg/h via INTRAVENOUS

## 2015-03-06 MED ORDER — LIDOCAINE HCL (PF) 1 % IJ SOLN
INTRAMUSCULAR | Status: DC | PRN
  Administered 2015-03-06: 2 mL via SUBCUTANEOUS
  Administered 2015-03-06: 5 mL via SUBCUTANEOUS

## 2015-03-06 MED ORDER — LIDOCAINE HCL (PF) 1 % IJ SOLN
INTRAMUSCULAR | Status: AC
  Filled 2015-03-06: qty 30

## 2015-03-06 MED ORDER — POTASSIUM CHLORIDE CRYS ER 20 MEQ PO TBCR
20.0000 meq | EXTENDED_RELEASE_TABLET | Freq: Every day | ORAL | Status: DC
  Administered 2015-03-06 – 2015-03-07 (×2): 20 meq via ORAL
  Filled 2015-03-06 (×2): qty 1

## 2015-03-06 MED ORDER — LIDOCAINE HCL (PF) 1 % IJ SOLN
INTRAMUSCULAR | Status: DC | PRN
  Administered 2015-03-06: 40 mL via INTRADERMAL

## 2015-03-06 MED ORDER — SODIUM CHLORIDE 0.9 % IR SOLN: 80.0000 mg | Status: DC

## 2015-03-06 MED ORDER — LISINOPRIL 5 MG PO TABS
5.0000 mg | ORAL_TABLET | Freq: Every day | ORAL | Status: DC
  Administered 2015-03-06: 5 mg via ORAL
  Filled 2015-03-06: qty 1

## 2015-03-06 MED ORDER — SODIUM CHLORIDE 0.9 % IJ SOLN: 3.0000 mL | INTRAMUSCULAR | Status: DC | PRN

## 2015-03-06 MED ORDER — ONDANSETRON HCL 4 MG/2ML IJ SOLN: 4.0000 mg | Freq: Four times a day (QID) | INTRAMUSCULAR | Status: DC | PRN

## 2015-03-06 MED ORDER — MIDAZOLAM HCL 2 MG/2ML IJ SOLN
INTRAMUSCULAR | Status: AC
  Filled 2015-03-06: qty 2

## 2015-03-06 MED ORDER — POTASSIUM CHLORIDE CRYS ER 20 MEQ PO TBCR
EXTENDED_RELEASE_TABLET | ORAL | Status: AC
  Filled 2015-03-06: qty 2

## 2015-03-06 MED ORDER — HEPARIN SODIUM (PORCINE) 1000 UNIT/ML IJ SOLN
INTRAMUSCULAR | Status: DC | PRN
  Administered 2015-03-06: 5000 [IU] via INTRAVENOUS

## 2015-03-06 MED ORDER — CEFAZOLIN SODIUM-DEXTROSE 2-3 GM-% IV SOLR: 2.0000 g | INTRAVENOUS | Status: DC

## 2015-03-06 MED ORDER — MUPIROCIN 2 % EX OINT
TOPICAL_OINTMENT | CUTANEOUS | Status: AC
  Administered 2015-03-06: 1 via NASAL
  Filled 2015-03-06: qty 22

## 2015-03-06 MED ORDER — NITROGLYCERIN 1 MG/10 ML FOR IR/CATH LAB
INTRA_ARTERIAL | Status: AC
  Filled 2015-03-06: qty 10

## 2015-03-06 MED ORDER — SODIUM CHLORIDE 0.9 % WEIGHT BASED INFUSION
3.0000 mL/kg/h | INTRAVENOUS | Status: DC
  Administered 2015-03-06: 3 mL/kg/h via INTRAVENOUS

## 2015-03-06 MED ORDER — SODIUM CHLORIDE 0.9 % IR SOLN
Status: AC
  Filled 2015-03-06: qty 2

## 2015-03-06 MED ORDER — MIDAZOLAM HCL 5 MG/5ML IJ SOLN
INTRAMUSCULAR | Status: DC | PRN
  Administered 2015-03-06: 2 mg via INTRAVENOUS

## 2015-03-06 MED ORDER — ACETAMINOPHEN 325 MG PO TABS: 650.0000 mg | ORAL_TABLET | ORAL | Status: DC | PRN

## 2015-03-06 MED ORDER — ASPIRIN 81 MG PO CHEW
CHEWABLE_TABLET | ORAL | Status: AC
  Administered 2015-03-06: 81 mg via ORAL
  Filled 2015-03-06: qty 1

## 2015-03-06 MED ORDER — VERAPAMIL HCL 2.5 MG/ML IV SOLN
INTRAVENOUS | Status: DC | PRN
  Administered 2015-03-06: 08:00:00 via INTRA_ARTERIAL

## 2015-03-06 MED ORDER — POTASSIUM CHLORIDE CRYS ER 20 MEQ PO TBCR
40.0000 meq | EXTENDED_RELEASE_TABLET | Freq: Once | ORAL | Status: AC
  Administered 2015-03-06: 40 meq via ORAL

## 2015-03-06 MED ORDER — IOHEXOL 350 MG/ML SOLN
INTRAVENOUS | Status: DC | PRN
  Administered 2015-03-06: 120 mL via INTRACARDIAC

## 2015-03-06 MED ORDER — NITROGLYCERIN 1 MG/10 ML FOR IR/CATH LAB
INTRA_ARTERIAL | Status: DC | PRN
  Administered 2015-03-06: 200 ug via INTRACORONARY

## 2015-03-06 MED ORDER — CEFAZOLIN SODIUM-DEXTROSE 2-3 GM-% IV SOLR
INTRAVENOUS | Status: DC | PRN
  Administered 2015-03-06: 2 g via INTRAVENOUS

## 2015-03-06 MED ORDER — MUPIROCIN 2 % EX OINT
TOPICAL_OINTMENT | Freq: Once | CUTANEOUS | Status: AC
  Administered 2015-03-06: 1 via NASAL

## 2015-03-06 MED ORDER — HEPARIN (PORCINE) IN NACL 2-0.9 UNIT/ML-% IJ SOLN
INTRAMUSCULAR | Status: AC
  Filled 2015-03-06: qty 1500

## 2015-03-06 MED ORDER — CARVEDILOL 12.5 MG PO TABS
12.5000 mg | ORAL_TABLET | Freq: Two times a day (BID) | ORAL | Status: DC
  Administered 2015-03-06 – 2015-03-07 (×2): 12.5 mg via ORAL
  Filled 2015-03-06 (×2): qty 1

## 2015-03-06 MED ORDER — ASPIRIN 81 MG PO CHEW
81.0000 mg | CHEWABLE_TABLET | ORAL | Status: AC
  Administered 2015-03-06: 81 mg via ORAL

## 2015-03-06 SURGICAL SUPPLY — 14 items
CATH BALLN WEDGE 5F 110CM (CATHETERS) ×1 IMPLANT
CATH INFINITI 5 FR AR2 MOD (CATHETERS) ×1 IMPLANT
CATH INFINITI 5 FR JL3.5 (CATHETERS) ×2 IMPLANT
CATH INFINITI 5FR ANG PIGTAIL (CATHETERS) ×1 IMPLANT
CATH INFINITI JR4 5F (CATHETERS) ×2 IMPLANT
DEVICE RAD COMP TR BAND LRG (VASCULAR PRODUCTS) ×2 IMPLANT
GLIDESHEATH SLEND A-KIT 6F 22G (SHEATH) ×2 IMPLANT
KIT HEART LEFT (KITS) ×3 IMPLANT
KIT HEART RIGHT NAMIC (KITS) ×1 IMPLANT
PACK CARDIAC CATHETERIZATION (CUSTOM PROCEDURE TRAY) ×2 IMPLANT
SHEATH FAST CATH BRACH 5F 5CM (SHEATH) ×1 IMPLANT
TRANSDUCER W/STOPCOCK (MISCELLANEOUS) ×2 IMPLANT
TUBING CIL FLEX 10 FLL-RA (TUBING) ×2 IMPLANT
WIRE SAFE-T 1.5MM-J .035X260CM (WIRE) ×2 IMPLANT

## 2015-03-06 SURGICAL SUPPLY — 8 items
CABLE SURGICAL S-101-97-12 (CABLE) ×1 IMPLANT
HEMOSTAT SURGICEL 2X4 FIBR (HEMOSTASIS) IMPLANT
ICD VISIA AF VR DVAB1D4 (ICD Generator) IMPLANT
LEAD RELIANCE G DF4 0293 (Lead) ×1 IMPLANT
PAD DEFIB LIFELINK (PAD) ×1 IMPLANT
SHEATH CLASSIC 9F (SHEATH) ×1 IMPLANT
TRAY PACEMAKER INSERTION (CUSTOM PROCEDURE TRAY) ×1 IMPLANT
VISIA AF VR DVAB1D4 (ICD Generator) ×2 IMPLANT

## 2015-03-06 NOTE — Interval H&P Note (Signed)
Cath Lab Visit (complete for each Cath Lab visit)  Clinical Evaluation Leading to the Procedure:   ACS: No.  Non-ACS:    Anginal Classification: CCS II  Anti-ischemic medical therapy: Minimal Therapy (1 class of medications)  Non-Invasive Test Results: No non-invasive testing performed  Prior CABG: No previous CABG      History and Physical Interval Note:  03/06/2015 7:15 AM  David Bright  has presented today for surgery, with the diagnosis of chronic systolic heart failure  The various methods of treatment have been discussed with the patient and family. After consideration of risks, benefits and other options for treatment, the patient has consented to  Procedure(s): Right/Left Heart Cath and Coronary Angiography (N/A) as a surgical intervention .  The patient's history has been reviewed, patient examined, no change in status, stable for surgery.  I have reviewed the patient's chart and labs.  Questions were answered to the patient's satisfaction.     Sinclair Grooms

## 2015-03-06 NOTE — Progress Notes (Signed)
Right antecubital introducer pulled and pressure held x 10 min and no bleeding noted

## 2015-03-06 NOTE — H&P (View-Only) (Signed)
ELECTROPHYSIOLOGY CONSULT NOTE  Patient ID: David Bright, MRN: [id number], DOB/AGE: [birth year] 54 y.o. Admit date: (Not on file) Date of Consult: 02/08/2015  Primary Physician: No PCP Per Patient Primary Cardiologist: KN  Chief Complaint: ICD   HPI David Bright is a 54 y.o. male referred for consideration of an ICD  He has a history of a nonischemic cardiomyopathy with catheterization remotely demonstrating no obstruction. Is was done 2003.   He's had recurrent hospitalizations for heart failure over the last year. These occur mostly in the context of medication noncompliance and interestingly he was actually off medications for many many years.  More recently he has done a better job with medications. He has intermittent dyspnea on exertion which is addressed by adjusting his diuretics on his own. He takes extra Lasix most days. He has not been having chest pain. He is aware of salt in his diet. He does pretty well.  He has some palpitations associated with presyncope; these are associated with a sensation of his heart beat stopping.   *Echocardiogram 10/01/2009 demonstrated severe LV dysfunction with an EF of 20-30% Myoview 3/16 demonstrated ejection fraction of 26%.  ECG was strikingly abnormal with biatrial enlargement and significant right axis deviation. Echo was ordered and is pending  Past Medical History  Diagnosis Date  . CHF (congestive heart failure)     Systolic and diastolic  . COPD (chronic obstructive pulmonary disease)   . Diabetes mellitus   . Hypertension   . Sleep apnea     Nocturnal CPAP  . Hyperlipidemia   . Non compliance with medical treatment   . Polysubstance abuse     History of quitting alcohol, marijuana and crack over 12 years ago  . Non-ischemic cardiomyopathy     Normal cath 2002  . NICM (nonischemic cardiomyopathy) 11/09/2011  . OSA on CPAP 11/09/2011  . HTN (hypertension) 11/09/2011  . DM (diabetes mellitus) 11/09/2011       Surgical History:  Past Surgical History  Procedure Laterality Date  . None       Home Meds: Prior to Admission medications   Medication Sig Start Date End Date Taking? Authorizing Provider  aspirin EC 81 MG tablet Take 1 tablet (81 mg total) by mouth daily. 10/19/13  Yes Dorothy Spark, MD  carvedilol (COREG) 12.5 MG tablet Take 12.5 mg by mouth 2 (two) times daily with a meal.    Yes Historical Provider, MD  furosemide (LASIX) 40 MG tablet Take 1 tablet (40 mg total) by mouth 2 (two) times daily. 10/20/14  Yes Dorothy Spark, MD  lisinopril (PRINIVIL,ZESTRIL) 5 MG tablet Take 1 tablet (5 mg total) by mouth daily. 11/16/14  Yes Dorothy Spark, MD  potassium chloride SA (K-DUR,KLOR-CON) 20 MEQ tablet Take 1 tablet (20 mEq total) by mouth daily. 01/26/15  Yes Dorothy Spark, MD  spironolactone (ALDACTONE) 25 MG tablet Take 1 tablet (25 mg total) by mouth daily. 11/22/14  Yes Dorothy Spark, MD      Allergies: No Known Allergies  History   Social History  . Marital Status: Single    Spouse Name: N/A  . Number of Children: N/A  . 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 Smoking status: Current Every Day Smoker -- 1.00 packs/day for 33 years    Types: Cigarettes  . Smokeless tobacco: Never Used  . Alcohol Use: No  Comment: QUIT IN 2000  . Drug Use: No  . Sexual Activity: No   Other Topics Concern  . Not on file   Social History Narrative   Lives in [place], [place] with housemate.      Family History  Problem Relation Age of Onset  . Cancer Mother      ROS:  Please see the history of present illness.     All other systems reviewed and negative.    Physical Exam:   Blood pressure 118/70, pulse 75, height 6' (1.829 m), weight 258 lb 12.8 oz (117.391 kg). General: Well developed, well nourished male in no acute distress. Head: Normocephalic, atraumatic, sclera non-icteric, no  xanthomas, nares are without discharge. EENT: normal Lymph Nodes:  none Back: without scoliosis/kyphosis, no CVA tendersness Neck: Negative for carotid bruits. JVD 10 cm with positive HJR  Lungs: Clear bilaterally to auscultation without wheezes, rales, or rhonchi. Breathing is unlabored. Heart: RRR with S1 S2.And an elevated P2 P2 was palpable.   2/6 systolic murmur at the left upper sternal border and a 2/6 holosystolic murmur at the apex , rubs, or gallops appreciated. Abdomen: Soft, non-tender, non-distended with normoactive bowel sounds. No hepatomegaly. No rebound/guarding. No obvious abdominal masses. Msk:  Strength and tone appear normal for age. Extremities: No clubbing or cyanosis.  Tr edema.  Distal pedal pulses are 2+ and equal bilaterally. Skin: Warm and Dry Neuro: Alert and oriented X 3. CN III-XII intact Grossly normal sensory and motor function . Psych:  Responds to questions appropriately with a normal affect.      Labs: Cardiac Enzymes No results for input(s): CKTOTAL, CKMB, TROPONINI in the last 72 hours. CBC Lab Results  Component Value Date   WBC 6.0 04/27/2014   HGB 14.8 04/27/2014   HCT 42.5 04/27/2014   MCV 85.5 04/27/2014   PLT 199 04/27/2014   PROTIME: No results for input(s): LABPROT, INR in the last 72 hours. Chemistry No results for input(s): NA, K, CL, CO2, BUN, CREATININE, CALCIUM, PROT, BILITOT, ALKPHOS, ALT, AST, GLUCOSE in the last 168 hours.  Invalid input(s): LABALBU Lipids Lab Results  Component Value Date   CHOL  05/22/2010    173        ATP III CLASSIFICATION:  <200     mg/dL   Desirable  200-239  mg/dL   Borderline High  >=240    mg/dL   High          HDL 42 05/22/2010   LDLCALC * 05/22/2010    116        Total Cholesterol/HDL:CHD Risk Coronary Heart Disease Risk Table                     Men   Women  1/2 Average Risk   3.4   3.3  Average Risk       5.0   4.4  2 X Average Risk   9.6   7.1  3 X Average Risk  23.4   11.0         Use the calculated Patient Ratio above and the CHD Risk Table to determine the patient's CHD Risk.        ATP III CLASSIFICATION (LDL):  <100     mg/dL   Optimal  100-129  mg/dL   Near or Above                    Optimal  130-159  mg/dL   Borderline  160-189  mg/dL   High  >190     mg/dL   Very High   TRIG 73 05/22/2010   BNP PRO B NATRIURETIC PEPTIDE (BNP)  Date/Time Value Ref Range Status  01/26/2015 12:53 PM 447.0* 0.0 - 100.0 pg/mL Final  11/22/2014 12:13 PM 236.0* 0.0 - 100.0 pg/mL Final  04/27/2014 05:50 AM 1567.0* 0 - 125 pg/mL Final  02/10/2014 03:04 PM 1044.0* 0 - 125 pg/mL Final   Thyroid Function Tests: No results for input(s): TSH, T4TOTAL, T3FREE, THYROIDAB in the last 72 hours.  Invalid input(s): FREET3    Miscellaneous No results found for: DDIMER  Radiology/Studies:  No results found.  EKG: Sinus rhythm at 75 Intervals 19/12/45 Axis CLVIII Biatrial enlargement   Assessment and Plan:  Obstructive sleep apnea-previously treated  Cardiomyopathy presumed nonischemic with some regional wall motion abnormalities ejection fraction 15-20%  Right axis deviation/biatrial enlargement  Congestive heart failure-chronic-systolic  Presyncope   The patient has long-standing persistent left ventricular dysfunction with remote catheterization demonstrating no significant obstruction. Echocardiogram today was reviewed personally with Dr. [name]. It shows significant left sided chamber enlargement; the right sided chambers are surprisingly small. Tricuspid regurgitation was insufficient to measure pulmonary pressures. There was some discrete wall motion abnormality. Hence, the initial step would be catheterization both right and left to look for obstructive disease as well as pulmonary pressures. In the event that revascularization was indicated that would be undertaken with subsequent reassessment Of left ventricular function with decisions regarding ICD implantation  deferred.  He is mildly volume overloaded. He has been doing a good job at a adjusting his diuretics on an as-needed basis.  He has a history of previously treated sleep apnea. Given the abnormal echo, will undertake a repeat sleep study.  He would be a reasonable candidate to switch from his ACE inhibitor to [facility]  Revascularization is not indicated, we would undertake ICD implantation.  Have reviewed the potential benefits and risks of ICD implantation including but not limited to death, perforation of heart or lung, lead dislodgement, infection,  device malfunction and inappropriate shocks.  The patient and family express understanding  and are willing to proceed.         Virl Axe

## 2015-03-06 NOTE — Interval H&P Note (Signed)
History and Physical Interval Note:  03/06/2015 12:54 PM  David Bright  has presented today for surgery, with the diagnosis of chronic systolic chf  The various methods of treatment have been discussed with the patient and family. After consideration of risks, benefits and other options for treatment, the patient has consented to  Procedure(s): ICD Implant (N/A) as a surgical intervention .  The patient's history has been reviewed, patient examined, no change in status, stable for surgery.  I have reviewed the patient's chart and labs.  Questions were answered to the patient's satisfaction.     Virl Axe  Cath today >>> no obstructive CAD  EF 20%ICD Criteria  Current LVEF:20%. Within 12 months prior to implant: Yes   Heart failure history: Yes, Class III  Cardiomyopathy history: Yes, Non-Ischemic Cardiomyopathy.  Atrial Fibrillation/Atrial Flutter: No.  Ventricular tachycardia history: No.  Cardiac arrest history: No.  History of syndromes with risk of sudden death: No.  Previous ICD: No.  Current ICD indication: Primary  PPM indication: No.  Beta Blocker therapy for 3 or more months: Yes, prescribed.   Ace Inhibitor/ARB therapy for 3 or more months: Yes, prescribed.

## 2015-03-07 ENCOUNTER — Ambulatory Visit (HOSPITAL_COMMUNITY): Payer: Medicaid Other

## 2015-03-07 ENCOUNTER — Encounter (HOSPITAL_COMMUNITY): Payer: Self-pay | Admitting: Internal Medicine

## 2015-03-07 ENCOUNTER — Other Ambulatory Visit: Payer: Self-pay | Admitting: Nurse Practitioner

## 2015-03-07 DIAGNOSIS — I5042 Chronic combined systolic (congestive) and diastolic (congestive) heart failure: Secondary | ICD-10-CM | POA: Diagnosis not present

## 2015-03-07 DIAGNOSIS — I429 Cardiomyopathy, unspecified: Secondary | ICD-10-CM | POA: Diagnosis not present

## 2015-03-07 DIAGNOSIS — E876 Hypokalemia: Secondary | ICD-10-CM

## 2015-03-07 DIAGNOSIS — I10 Essential (primary) hypertension: Secondary | ICD-10-CM | POA: Diagnosis not present

## 2015-03-07 DIAGNOSIS — E119 Type 2 diabetes mellitus without complications: Secondary | ICD-10-CM | POA: Diagnosis not present

## 2015-03-07 LAB — GLUCOSE, CAPILLARY
Glucose-Capillary: 182 mg/dL — ABNORMAL HIGH (ref 65–99)
Glucose-Capillary: 193 mg/dL — ABNORMAL HIGH (ref 65–99)

## 2015-03-07 MED ORDER — LISINOPRIL 10 MG PO TABS: 10.0000 mg | ORAL_TABLET | Freq: Every day | ORAL | Status: DC

## 2015-03-07 MED ORDER — METFORMIN HCL 500 MG PO TABS: 500.0000 mg | ORAL_TABLET | Freq: Two times a day (BID) | ORAL | Status: DC

## 2015-03-07 MED ORDER — POTASSIUM CHLORIDE CRYS ER 20 MEQ PO TBCR
20.0000 meq | EXTENDED_RELEASE_TABLET | Freq: Every day | ORAL | Status: DC
Start: 2015-03-07 — End: 2016-03-23

## 2015-03-07 MED ORDER — ATORVASTATIN CALCIUM 10 MG PO TABS: 10.0000 mg | ORAL_TABLET | Freq: Every day | ORAL | Status: DC

## 2015-03-07 MED ORDER — LISINOPRIL 10 MG PO TABS
10.0000 mg | ORAL_TABLET | Freq: Every day | ORAL | Status: DC
  Administered 2015-03-07: 10:00:00 10 mg via ORAL
  Filled 2015-03-07: qty 1

## 2015-03-07 MED ORDER — FUROSEMIDE 10 MG/ML IJ SOLN
40.0000 mg | Freq: Once | INTRAMUSCULAR | Status: AC
  Administered 2015-03-07: 40 mg via INTRAVENOUS
  Filled 2015-03-07: qty 4

## 2015-03-07 MED FILL — Sodium Chloride Irrigation Soln 0.9%: Qty: 500 | Status: AC

## 2015-03-07 MED FILL — Gentamicin Sulfate Inj 40 MG/ML: INTRAMUSCULAR | Qty: 2 | Status: AC

## 2015-03-07 NOTE — Discharge Instructions (Signed)
**PLEASE REMEMBER TO BRING ALL OF YOUR MEDICATIONS TO EACH OF YOUR FOLLOW-UP OFFICE VISITS.  Radial Site Care Refer to this sheet in the next few weeks. These instructions provide you with information on caring for yourself after your procedure. Your caregiver may also give you more specific instructions. Your treatment has been planned according to current medical practices, but problems sometimes occur. Call your caregiver if you have any problems or questions after your procedure. HOME CARE INSTRUCTIONS  You may shower the day after the procedure.Remove the bandage (dressing) and gently wash the site with plain soap and water.Gently pat the site dry.   Do not apply powder or lotion to the site.   Do not submerge the affected site in water for 3 to 5 days.   Inspect the site at least twice daily.   Do not flex or bend the affected arm for 24 hours.   No lifting over 5 pounds (2.3 kg) for 5 days after your procedure.   Do not drive home if you are discharged the same day of the procedure. Have someone else drive you.   You may drive 24 hours after the procedure unless otherwise instructed by your caregiver.  What to expect:  Any bruising will usually fade within 1 to 2 weeks.   Blood that collects in the tissue (hematoma) may be painful to the touch. It should usually decrease in size and tenderness within 1 to 2 weeks.  SEEK IMMEDIATE MEDICAL CARE IF:  You have unusual pain at the radial site.   You have redness, warmth, swelling, or pain at the radial site.   You have drainage (other than a small amount of blood on the dressing).   You have chills.   You have a fever or persistent symptoms for more than 72 hours.   You have a fever and your symptoms suddenly get worse.   Your arm becomes pale, cool, tingly, or numb.   You have heavy bleeding from the site. Hold pressure on the site.      Supplemental Discharge Instructions for  Pacemaker/Defibrillator  Patients  Activity No heavy lifting or vigorous activity with your left/right arm for 6 to 8 weeks.  Do not raise your left/right arm above your head for one week.  Gradually raise your affected arm as drawn below.           __       7/31             /         8/1          /           8/2          /      8/3        NO DRIVING for     ; you may begin driving on  8/3   .  WOUND CARE - Keep the wound area clean and dry.  Do not get this area wet for one week. No showers for one week; you may shower on 8/3    . - The tape/steri-strips on your wound will fall off; do not pull them off.  No bandage is needed on the site.  DO  NOT apply any creams, oils, or ointments to the wound area. - If you notice any drainage or discharge from the wound, any swelling or bruising at the site, or you develop a fever > 101? F after you  are discharged home, call the office at once.  Special Instructions - You are still able to use cellular telephones; use the ear opposite the side where you have your pacemaker/defibrillator.  Avoid carrying your cellular phone near your device. - When traveling through airports, show security personnel your identification card to avoid being screened in the metal detectors.  Ask the security personnel to use the hand wand. - Avoid arc welding equipment, MRI testing (magnetic resonance imaging), TENS units (transcutaneous nerve stimulators).  Call the office for questions about other devices. - Avoid electrical appliances that are in poor condition or are not properly grounded. - Microwave ovens are safe to be near or to operate.  Additional information for defibrillator patients should your device go off: - If your device goes off ONCE and you feel fine afterward, notify the device clinic nurses. - If your device goes off ONCE and you do not feel well afterward, call 911. - If your device goes off TWICE, call 911. - If your device goes off THREE times in one day, call 911.  DO  NOT DRIVE YOURSELF OR A FAMILY MEMBER WITH A DEFIBRILLATOR TO THE HOSPITAL--CALL 911.

## 2015-03-07 NOTE — Discharge Summary (Signed)
Discharge Summary   Patient ID: David Bright,  MRN: [id number], DOB/AGE: 55-20-1962 55 y.o.  Admit date: 03/06/2015 Discharge date: 03/07/2015  Primary Care Provider: None Primary Cardiologist: Liane Comber, MD / S. Caryl Comes, MD.  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  **S/P MDT single lead AICD.  Active Problems:   Chronic combined systolic and diastolic CHF, NYHA class 2  **Normal Cardiac output/index by right heart cath with elevated filling pressures.   HTN (hypertension)   DM (diabetes mellitus) II   DOE (dyspnea on exertion)   OSA on CPAP   Hyperlipidemia   COPD (chronic obstructive pulmonary disease)   Tobacco Abuse  Allergies No Known Allergies  Procedures  Cardiac Catheterization 7.25.2016  Coronary Findings     Dominance: Right    Left Anterior Descending   . Dist LAD lesion, 30% stenosed. The lesion is type non-C diffuse tubular .   Marland Kitchen First Diagonal Branch   The vessel is small in size.      Left Circumflex   . First Obtuse Marginal Branch   The vessel is small in size.         Right Heart Pressures Hemodynamic findings consistent with moderate pulmonary hypertension.   Left Ventricle The left ventricle is enlarged. The left ventricular ejection fraction is less than 25% by visual estimate. There are wall motion abnormalities in the left ventricle. There is global hypokinesis of the left ventricle.  Hemo Data         Most Recent Value    Fick Cardiac Output  5.74 L/min    Fick Cardiac Output Index  2.44 (L/min)/BSA    RA A Wave  12 mmHg    RA V Wave  11 mmHg    RA Mean  9 mmHg    RV Systolic Pressure  60 mmHg    RV Diastolic Pressure  0 mmHg    RV EDP  10 mmHg    PA Systolic Pressure  50 mmHg    PA Diastolic Pressure  18 mmHg    PA Mean  30 mmHg    PW A Wave  18 mmHg    PW V Wave  20 mmHg    PW Mean  16 mmHg    AO Systolic Pressure  540 mmHg    AO Diastolic  Pressure  77 mmHg    AO Mean  103 mmHg    LV Systolic Pressure  086 mmHg    LV Diastolic Pressure  8 mmHg    LV EDP  24 mmHg    Arterial Occlusion Pressure Extended Systolic Pressure  761 mmHg    Arterial Occlusion Pressure Extended Diastolic Pressure  75 mmHg    Arterial Occlusion Pressure Extended Mean Pressure  99 mmHg    Left Ventricular Apex Extended Systolic Pressure  950 mmHg    Left Ventricular Apex Extended Diastolic Pressure  8 mmHg    Left Ventricular Apex Extended EDP Pressure  26 mmHg    QP/QS  1    TPVR Index  12.29 HRUI    TSVR Index  42.16 HRUI    PVR SVR Ratio  0.15    TPVR/TSVR Ratio  0.29   _____________  AICD Placement 7.25.2016  Medtronic  ICD, serial number  [id number] H _____________   History of Present Illness  54 y/o male with a h/o NICM, chronic systolic CHF, HTN, DM, Tob Abuse, and sleep apnea.  He was recently referred to  Electrophysiology for consideration of ICD placement in the setting of ongoing LV dysfunction despite optimal medical therapy.  He was seen in clinic on 6/29, and echo continued to show LV dysfunction with question of a discrete wall motion abnormality.  Decision was made to pursue diagnostic catheterization to be followed by ICD placement.  Hospital Course  Patient presented to the [facility] cardiac catheterization laboratory on 7/25 and underwent diagnostic right and left heart cardiac catheterization revealing minimal, non-obstructive CAD with moderately elevated right heart pressures.  In the absence of need for revascularization, he was felt to be an appropriate candidate for ICD placement and he was later taken to the electrophysiology laboratory, where he underwent successful placement of a Medtronic single lead AICD.  He tolerated the procedure well and was monitored overnight.  He did c/o orthopnea this morning and was felt to have mild volume overload and was thus treated with a single dose of IV lasix this AM.  He has since been  ambulating without difficulty and will be discharged home today in good condition.  We discussed the importance of daily weights, sodium restriction, medication compliance, and symptom reporting and he verbalizes understanding.  Further, we discussed the importance of obtaining and following-up with a primary care provider in the setting of untreated type II diabetes mellitus.  Discharge Vitals Blood pressure 151/86, pulse 87, temperature 98.1 F (36.7 C), temperature source Oral, resp. rate 25, height 5\' 11"  (1.803 m), weight 260 lb 12.9 oz (118.3 kg), SpO2 98 %.  Filed Weights   03/06/15 0531 03/07/15 0036  Weight: 258 lb (117.028 kg) 260 lb 12.9 oz (118.3 kg)    Labs  CBC  Recent Labs  03/06/15 0724  HGB 15.3  HCT 62.1   Basic Metabolic Panel  Recent Labs  03/06/15 0724  NA 142  K 3.3*  CL 100*  GLUCOSE 149*  BUN 19  CREATININE 0.90   Disposition  Pt is being discharged home today in good condition.  Follow-up Plans & Appointments  Follow-up Information    Follow up with Dorothy Spark, MD On 05/04/2015.   Specialty:  Cardiology   Why:  11:30 AM   Contact information:   [address] [place] [address] [phone]       Follow up with [facility] On 03/16/2015.   Why:  2 PM   Contact information:   [place] [address] [phone]      Follow up with Virl Axe, MD On 06/20/2015.   Specialty:  Cardiology   Why:  1:30 PM   Contact information:   [address] [place] [address] [phone]      Discharge Medications    Medication List    STOP taking these medications        POTASSIUM CITRATE PO      TAKE these medications        aspirin EC 81 MG tablet  Take 1 tablet (81 mg total) by mouth daily.     atorvastatin 10 MG tablet  Commonly known as:  LIPITOR  Take 1 tablet (10 mg total) by mouth daily at 6  PM.     carvedilol 12.5 MG tablet  Commonly known as:  COREG  Take 12.5 mg by mouth 2 (two) times daily with a meal.     furosemide 40 MG tablet  Commonly known as:  LASIX  Take 1 tablet (40 mg  total) by mouth 2 (two) times daily.     lisinopril 10 MG tablet  Commonly known as:  PRINIVIL,ZESTRIL  Take 1 tablet (10 mg total) by mouth daily.     metFORMIN 500 MG tablet  Commonly known as:  GLUCOPHAGE  Take 1 tablet (500 mg total) by mouth 2 (two) times daily with a meal.     potassium chloride SA 20 MEQ tablet  Commonly known as:  K-DUR,KLOR-CON  Take 1 tablet (20 mEq total) by mouth daily.     spironolactone 25 MG tablet  Commonly known as:  ALDACTONE  Take 1 tablet (25 mg total) by mouth daily.       Outstanding Labs/Studies  F/U lipids/lft's in 8 wks. F/U BMET on 8/4.  Duration of Discharge Encounter   Greater than 30 minutes including physician time.  Signed, Murray Hodgkins NP 03/07/2015, 8:33 AM

## 2015-03-07 NOTE — Progress Notes (Signed)
Patient Name: David Bright Date of Encounter: 03/07/2015   Principal Problem:   NICM (nonischemic cardiomyopathy) Active Problems:   Chronic combined systolic and diastolic CHF, NYHA class 2   HTN (hypertension)   DM (diabetes mellitus)   DOE (dyspnea on exertion)   OSA on CPAP   Hyperlipidemia   COPD (chronic obstructive pulmonary disease)   Tob Abuse  SUBJECTIVE  Some orthopnea last night.  No dyspnea this AM.  Chest wall "a little sore."  CURRENT MEDS . aspirin EC  81 mg Oral Daily  . carvedilol  12.5 mg Oral BID WC  . furosemide  40 mg Oral BID  . lisinopril  5 mg Oral Daily  . potassium chloride SA  20 mEq Oral Daily  . sodium chloride  3 mL Intravenous Q12H  . spironolactone  25 mg Oral Daily    OBJECTIVE  Filed Vitals:   03/06/15 1938 03/06/15 2000 03/07/15 0036 03/07/15 0609  BP: 146/85 132/74 155/88 158/94  Pulse: 74  73 77  Temp:   98.3 F (36.8 C) 97.7 F (36.5 C)  TempSrc: Oral  Oral Oral  Resp: 21 12 13 20   Height:      Weight:   260 lb 12.9 oz (118.3 kg)   SpO2: 100% 100% 99% 96%    Intake/Output Summary (Last 24 hours) at 03/07/15 0740 Last data filed at 03/07/15 0411  Gross per 24 hour  Intake 1217.5 ml  Output   1500 ml  Net -282.5 ml   Filed Weights   03/06/15 0531 03/07/15 0036  Weight: 258 lb (117.028 kg) 260 lb 12.9 oz (118.3 kg)    PHYSICAL EXAM  General: Pleasant, NAD. Neuro: Alert and oriented X 3. Moves all extremities spontaneously. Psych: Normal affect. HEENT:  Normal  Neck: Supple without bruits.  JVP ~ 12 cm. Lungs:  Resp regular and unlabored, diminished @ bases.  OTW CTA. Heart: RRR no s3, s4, or murmurs.  Chest wall with dime size area of bloody drainage on dsg, otw soft w/o hematoma. Abdomen: Soft, non-tender, non-distended, BS + x 4.  Extremities: No clubbing, cyanosis or edema. DP/PT/Radials 2+ and equal bilaterally.  Accessory Clinical Findings  CBC  Recent Labs  03/06/15 0724  HGB 15.3  HCT 21.1    Basic Metabolic Panel  Recent Labs  03/06/15 0724  NA 142  K 3.3*  CL 100*  GLUCOSE 149*  BUN 19  CREATININE 0.90    TELE  Rsr, pvc's, brief run of SVT yesterday afternoon.  ECG  RSR, 77, LVH, LAD, bi-atrial enlargement, poor R progression.  Radiology/Studies  No results found.  ASSESSMENT AND PLAN  1.  NICM/Chronic systolic CHF:  EF 94%.  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  EDP 24 mmHg.  S/P single lead ICD on 7/25.  Had some orthopnea last night.  CXR shows vascular congestion this AM and wt is up slightly.  Will give dose of IV lasix.  Probable d/c this afternoon.  Cont bb, acei, spiro, po lasix.  Consider transition from acei to entresto as outpt if he can afford it.  Ultimately, would benefit from referral to CHF clinic.  2.  Essential HTN:  BP overnight.  IV lasix this AM.  Push lisinopril to 10.  Cont coreg.  3.  HL:  HL listed in hx but not lipids in system since 2011.  He is not on a statin.  In setting of nonobs dzs, will add statin.  F/U lipids/lft's in 8 wks.  4.  OSA:  Prev used CPAP @ home but doesn't currently have equipment.  He will need repeat outpt sleep eval and re-fitting.  5.  DM II:  Pt says he was prev on metformin but that he doesn't have a PCP and so it hasn't been refilled.  Sugars have been 140's to 250's.  I will Rx metformin 500 bid to be started on 7/27 since he got contrast yesterday.  We discussed that it is important for him to obtain a PCP for mgmt and future refills.  Cont acei.  Add statin.  6.  Tob Abuse:  Complete cessation advised.  7.  Hypokalemia:  KCL added yesterday.    Signed, Murray Hodgkins NP   I have seen, examined the patient, and reviewed the above assessment and plan.  Changes to above are made where necessary.  CXR reveals stable leads, no ptx.  Device interrogation is reviewed and normal.  OK to discharge to home.  Co Sign: Thompson Grayer, MD 03/07/2015 12:47 PM

## 2015-03-08 ENCOUNTER — Telehealth: Payer: Self-pay | Admitting: Cardiology

## 2015-03-08 NOTE — Telephone Encounter (Signed)
Spoke with patient who then put his sister on the phone to talk about his current medication dosages; states they are confused since getting discharged from the hospital.  Patient's sister states she has picked up new Rx from pharmacy and knows patient has duplicates of some of these at home.  I advised her to call me back when they get to patient's home and can see pill bottles to review correct dosage.  She verbalized understanding and agreement.

## 2015-03-08 NOTE — Telephone Encounter (Signed)
Patient's sister called from patient's home and I reviewed current medications and dosages.  Patient also got on the phone to verify that he knows correct pill and dose.  He verbalized understanding of my instructions.  I advised him to call back with questions or concerns.

## 2015-03-08 NOTE — Telephone Encounter (Signed)
New message      Pt had a pacemaker put in last Monday----pt has a question about dosage on his medications. (lisinopril and lipitor).  He needs to get them filled but thinks the dosage has been increased and does not know why

## 2015-03-10 ENCOUNTER — Other Ambulatory Visit: Payer: Self-pay | Admitting: *Deleted

## 2015-03-10 MED ORDER — LOVASTATIN 20 MG PO TABS: 20.0000 mg | ORAL_TABLET | Freq: Every day | ORAL | Status: DC

## 2015-03-14 ENCOUNTER — Telehealth: Payer: Self-pay | Admitting: *Deleted

## 2015-03-14 NOTE — Telephone Encounter (Signed)
Patient asked that you call his home number [phone]

## 2015-03-14 NOTE — Telephone Encounter (Signed)
I called patient to schedule sleep study and he had other questions regarding his procedure.   Patient is wanting to know if he will be able to return to work at the end of this week. He is requesting that you call back to let him know

## 2015-03-16 ENCOUNTER — Ambulatory Visit (INDEPENDENT_AMBULATORY_CARE_PROVIDER_SITE_OTHER): Payer: Self-pay | Admitting: *Deleted

## 2015-03-16 ENCOUNTER — Other Ambulatory Visit (INDEPENDENT_AMBULATORY_CARE_PROVIDER_SITE_OTHER): Payer: Self-pay | Admitting: *Deleted

## 2015-03-16 DIAGNOSIS — I428 Other cardiomyopathies: Secondary | ICD-10-CM

## 2015-03-16 DIAGNOSIS — E876 Hypokalemia: Secondary | ICD-10-CM

## 2015-03-16 DIAGNOSIS — I429 Cardiomyopathy, unspecified: Secondary | ICD-10-CM

## 2015-03-16 DIAGNOSIS — I5022 Chronic systolic (congestive) heart failure: Secondary | ICD-10-CM

## 2015-03-16 LAB — CUP PACEART INCLINIC DEVICE CHECK
Brady Statistic RV Percent Paced: 0.1 % — CL
Date Time Interrogation Session: 20160804152631
HighPow Impedance: 55 Ohm
Lead Channel Impedance Value: 380 Ohm
Lead Channel Pacing Threshold Amplitude: 0.75 V
Lead Channel Setting Pacing Pulse Width: 0.4 ms
MDC IDC MSMT BATTERY REMAINING LONGEVITY: 136.8 mo
MDC IDC MSMT LEADCHNL RV PACING THRESHOLD PULSEWIDTH: 0.4 ms
MDC IDC MSMT LEADCHNL RV SENSING INTR AMPL: 19.5 mV
MDC IDC SET LEADCHNL RV PACING AMPLITUDE: 3.5 V
MDC IDC SET LEADCHNL RV SENSING SENSITIVITY: 0.3 mV
MDC IDC SET ZONE DETECTION INTERVAL: 350.88 ms
Zone Setting Detection Interval: 250 ms

## 2015-03-16 LAB — BASIC METABOLIC PANEL
BUN: 27 mg/dL — ABNORMAL HIGH (ref 6–23)
CALCIUM: 9.3 mg/dL (ref 8.4–10.5)
CHLORIDE: 101 meq/L (ref 96–112)
CO2: 32 mEq/L (ref 19–32)
CREATININE: 1.08 mg/dL (ref 0.40–1.50)
GFR: 91.16 mL/min (ref >60.00)
Glucose, Bld: 249 mg/dL — ABNORMAL HIGH (ref 70–99)
POTASSIUM: 4.3 meq/L (ref 3.5–5.1)
Sodium: 139 mEq/L (ref 135–145)

## 2015-03-16 NOTE — Progress Notes (Signed)
Wound check appointment. Wound without redness or edema. Incision edges approximated, wound well healed. Normal device function. Threshold, sensing, and impedances consistent with implant measurements. Device programmed at 3.5V for extra safety margin until 3 month visit. Histogram distribution appropriate for patient and level of activity. No mode switches. 1 NSVT episode- 8 beats, 182bpm. Patient educated about wound care, arm mobility, lifting restrictions, shock plan. ROV with SK in November.

## 2015-03-17 ENCOUNTER — Telehealth: Payer: Self-pay

## 2015-03-17 NOTE — Telephone Encounter (Signed)
Unfortunately, with LVEF < 35% he won't be able to drive. If his LVEF improves in the future he would have to be approved by Department of transportation.

## 2015-03-17 NOTE — Telephone Encounter (Signed)
-----   Message from Rogelia Mire, NP sent at 03/16/2015  5:12 PM EDT ----- Renal fxn/K stable.

## 2015-03-17 NOTE — Telephone Encounter (Signed)
Follow up     Pt calling to check if letter has been completed for him to drive Please call to discuss

## 2015-03-17 NOTE — Telephone Encounter (Signed)
Informed patient of results and verbal understanding expressed.   Patient needs a statement saying it is OK for him to drive for work. Informed patient Dr. [name] will have to address this and he will be called back. Patient requests a phone call when letter is ready and he will come pick it up. He is requesting this happen today.

## 2015-03-17 NOTE — Telephone Encounter (Signed)
Notified the pt that per Dr [name] and Dr Caryl Comes his LVEF < 35 % he won't be able to drive.  Informed the pt that per Dr [name] and Dr Caryl Comes, if his LVEF improves in the future he would have to be approved by Department of Transportation.  Pt verbalized understanding and agrees with this plan.

## 2015-05-02 ENCOUNTER — Encounter: Payer: Self-pay | Admitting: Internal Medicine

## 2015-05-04 ENCOUNTER — Encounter: Payer: Self-pay | Admitting: Cardiology

## 2015-05-04 ENCOUNTER — Ambulatory Visit (INDEPENDENT_AMBULATORY_CARE_PROVIDER_SITE_OTHER): Payer: Medicaid Other | Admitting: Cardiology

## 2015-05-04 VITALS — BP 86/50 | HR 78 | Ht 72.0 in | Wt 258.4 lb

## 2015-05-04 DIAGNOSIS — I429 Cardiomyopathy, unspecified: Secondary | ICD-10-CM | POA: Diagnosis not present

## 2015-05-04 DIAGNOSIS — I5042 Chronic combined systolic (congestive) and diastolic (congestive) heart failure: Secondary | ICD-10-CM | POA: Diagnosis not present

## 2015-05-04 DIAGNOSIS — I10 Essential (primary) hypertension: Secondary | ICD-10-CM

## 2015-05-04 DIAGNOSIS — I428 Other cardiomyopathies: Secondary | ICD-10-CM

## 2015-05-04 DIAGNOSIS — I5022 Chronic systolic (congestive) heart failure: Secondary | ICD-10-CM | POA: Diagnosis not present

## 2015-05-04 LAB — COMPREHENSIVE METABOLIC PANEL
ALT: 15 U/L (ref 0–53)
AST: 13 U/L (ref 0–37)
Albumin: 4 g/dL (ref 3.5–5.2)
Alkaline Phosphatase: 79 U/L (ref 39–117)
BUN: 21 mg/dL (ref 6–23)
CO2: 32 mEq/L (ref 19–32)
Calcium: 9.3 mg/dL (ref 8.4–10.5)
Chloride: 99 mEq/L (ref 96–112)
Creatinine, Ser: 0.93 mg/dL (ref 0.40–1.50)
GFR: 108.27 mL/min (ref >60.00)
Glucose, Bld: 262 mg/dL — ABNORMAL HIGH (ref 70–99)
Potassium: 4.1 mEq/L (ref 3.5–5.1)
Sodium: 138 mEq/L (ref 135–145)
Total Bilirubin: 0.5 mg/dL (ref 0.2–1.2)
Total Protein: 7.5 g/dL (ref 6.0–8.3)

## 2015-05-04 LAB — BRAIN NATRIURETIC PEPTIDE: Pro B Natriuretic peptide (BNP): 282 pg/mL — ABNORMAL HIGH (ref 0.0–100.0)

## 2015-05-04 MED ORDER — LISINOPRIL 5 MG PO TABS: 5.0000 mg | ORAL_TABLET | Freq: Every day | ORAL | Status: DC

## 2015-05-04 NOTE — Progress Notes (Signed)
Patient ID: David Bright, male   DOB: 03-15-1961, 54 y.o.   MRN: [id number]  Primary Physician: Pcp Not In System Primary Cardiologist: KN  Chief Complaint: ICD  HPI A 54 year old male with h/o hypertension, NIDDM, OSA, hyperlipidemia and combined systolic and diastolic CHF. He has h/o non-ischemic cardiomyopathy, normal cath in 2003. The patient hasn't followed in the last two years as he didnn't have medical insurance and couldn't afford doctor's visits. As a consequence he ran out of some medications refill such as metformin, he doesn't have PCP. His last echocardiogram in 2011 showed severely dilated left ventricle with LVEF 25-30% and grade 2 diastolic dysfunction with elevated filling pressures. Normal RVSP. He started to follow again in 2015 with findings of severely dilated LV and LVEF 15-20%, he was started on optimal medical therapy with no improvement in LVEF on echocardiogram in June 2016. 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  05/04/2015 - he states that he feels tired all the time and sleeps a lot. He gets help with ADLs from his cousin. No CP, DOE, palpitations, no ICD firing since the implantation. Denies LE edema, orthopnea or PND. NYHA II-III, gets SOB after walking 50 yards.    Past Medical History  Diagnosis Date  . CHF (congestive heart failure)     Systolic and diastolic  . COPD (chronic obstructive pulmonary disease)   . Type II diabetes mellitus   . Hypertension   . Sleep apnea     Nocturnal CPAP  . Hyperlipidemia   . Non compliance with medical treatment   . Polysubstance abuse     History of quitting alcohol, marijuana and crack over 12 years ago  . Non-ischemic cardiomyopathy     a. Normal cath 2002;  b. 01/2015 Echo: EF 15-20%, mod conc LVH, restrictive physiology, mod MR, sev dil LA, triv TR/PI;  c. 02/2015 Cath: LM nl, LAD 30d, LCX nl, OM1 small, nl, RCA nl;   d. 02/2015 s/p MDT single lead AICD (ser# [id number]).  . OSA on CPAP 11/09/2011  . HTN (hypertension) 11/09/2011  . DM (diabetes mellitus) 11/09/2011      Surgical History:  Past Surgical History  Procedure Laterality Date  . None    . Cardiac catheterization N/A 03/06/2015    Procedure: Right/Left Heart Cath and Coronary Angiography;  Surgeon: Belva Crome, MD;  Location: [facility];  Service: Cardiovascular;  Laterality: N/A;  . Ep implantable device N/A 03/06/2015    Procedure: ICD Implant;  Surgeon: Deboraha Sprang, MD;  Location: [facility];  Service: Cardiovascular;  Laterality: N/A;     Home Meds:  Current outpatient prescriptions:  .  aspirin EC 81 MG tablet, Take 1 tablet (81 mg total) by mouth daily., Disp: 30 tablet, Rfl: 0 .  carvedilol (COREG) 12.5 MG tablet, Take 12.5 mg by mouth 2 (two) times daily with a meal. , Disp: , Rfl:  .  furosemide (LASIX) 40 MG tablet, Take 1 tablet (40 mg total) by mouth 2 (two) times daily., Disp: 180 tablet, Rfl: 6 .  lovastatin (MEVACOR) 20 MG tablet, Take 20 mg by mouth at bedtime., Disp: , Rfl:  .  metFORMIN (GLUCOPHAGE) 500 MG tablet, Take 1 tablet (500 mg total) by mouth 2 (two) times daily with a meal., Disp: 60 tablet, Rfl: 3 .  potassium chloride SA (K-DUR,KLOR-CON) 20 MEQ tablet, Take  1 tablet (20 mEq total) by mouth daily., Disp: 90 tablet, Rfl: 3 .  spironolactone (ALDACTONE) 25 MG tablet, Take 1 tablet (25 mg total) by mouth daily., Disp: 90 tablet, Rfl: 3 .  lisinopril (PRINIVIL,ZESTRIL) 5 MG tablet, Take 1 tablet (5 mg total) by mouth daily., Disp: 90 tablet, Rfl: 3  Allergies: No Known Allergies  Social History   Social History  . Marital Status: Single    Spouse Name: N/A  . Number of Children: N/A  . 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 Smoking status: Current Every Day Smoker -- 1.00 packs/day for 33 years    Types: Cigarettes    . Smokeless tobacco: Never Used  . Alcohol Use: No     Comment: QUIT IN 2000  . Drug Use: No     Comment: quit 12 years ago  . Sexual Activity: No   Other Topics Concern  . Not on file   Social History Narrative   Lives in [place], [place] with housemate.      Family History  Problem Relation Age of Onset  . Cancer Mother      ROS:  Please see the history of present illness.     All other systems reviewed and negative.    Physical Exam:   Blood pressure 86/50, pulse 78, height 6' (1.829 m), weight 258 lb 6.4 oz (117.209 kg). General: Well developed, well nourished male in no acute distress. Head: Normocephalic, atraumatic, sclera non-icteric, no xanthomas, nares are without discharge. EENT: normal Lymph Nodes:  none Back: without scoliosis/kyphosis, no CVA tendersness Neck: Negative for carotid bruits. JVD 10 cm with positive HJR  Lungs: Clear bilaterally to auscultation without wheezes, rales, or rhonchi. Breathing is unlabored. Heart: RRR with S1 S2.And an elevated P2 P2 was palpable.   2/6 systolic murmur at the left upper sternal border and a 2/6 holosystolic murmur at the apex , rubs, or gallops appreciated. Abdomen: Soft, non-tender, non-distended with normoactive bowel sounds. No hepatomegaly. No rebound/guarding. No obvious abdominal masses. Msk:  Strength and tone appear normal for age. Extremities: No clubbing or cyanosis.  Tr edema.  Distal pedal pulses are 2+ and equal bilaterally. Skin: Warm and Dry Neuro: Alert and oriented X 3. CN III-XII intact Grossly normal sensory and motor function . Psych:  Responds to questions appropriately with a normal affect.      Labs: Cardiac Enzymes No results for input(s): CKTOTAL, CKMB, TROPONINI in the last 72 hours. CBC Lab Results  Component Value Date   WBC 5.3 02/27/2015   HGB 15.3 03/06/2015   HCT 45.0 03/06/2015   MCV 88.8 02/27/2015   PLT 174.0 02/27/2015   PROTIME: No results for input(s): LABPROT, INR in  the last 72 hours. Chemistry No results for input(s): NA, K, CL, CO2, BUN, CREATININE, CALCIUM, PROT, BILITOT, ALKPHOS, ALT, AST, GLUCOSE in the last 168 hours.  Invalid input(s): LABALBU Lipids Lab Results  Component Value Date   CHOL  05/22/2010    173        ATP III CLASSIFICATION:  <200     mg/dL   Desirable  200-239  mg/dL   Borderline High  >=240    mg/dL   High          HDL 42 05/22/2010   LDLCALC * 05/22/2010    116        Total Cholesterol/HDL:CHD Risk Coronary Heart Disease  Risk Table                     Men   Women  1/2 Average Risk   3.4   3.3  Average Risk       5.0   4.4  2 X Average Risk   9.6   7.1  3 X Average Risk  23.4   11.0        Use the calculated Patient Ratio above and the CHD Risk Table to determine the patient's CHD Risk.        ATP III CLASSIFICATION (LDL):  <100     mg/dL   Optimal  100-129  mg/dL   Near or Above                    Optimal  130-159  mg/dL   Borderline  160-189  mg/dL   High  >190     mg/dL   Very High   TRIG 73 05/22/2010   BNP PRO B NATRIURETIC PEPTIDE (BNP)  Date/Time Value Ref Range Status  01/26/2015 12:53 PM 447.0* 0.0 - 100.0 pg/mL Final  11/22/2014 12:13 PM 236.0* 0.0 - 100.0 pg/mL Final  04/27/2014 05:50 AM 1567.0* 0 - 125 pg/mL Final  02/10/2014 03:04 PM 1044.0* 0 - 125 pg/mL Final   EKG: Sinus rhythm at 75 Intervals 19/12/45 Axis CLVIII Biatrial enlargement  TTE: 02/08/15 - Left ventricle: The cavity size was severely dilated. There was moderate concentric hypertrophy. Systolic function was severely reduced. The estimated ejection fraction was in the range of 15-20%. Doppler parameters are consistent with restrictive physiology, indicative of decreased left ventricular diastolic compliance and/or increased left atrial pressure. Doppler parameters are consistent with both elevated ventricular end-diastolic filling pressure and elevated left atrial filling pressure. - Mitral valve: There  was moderate regurgitation. - Left atrium: The atrium was severely dilated. - Right atrium: The atrium was normal in size. - Atrial septum: No defect or patent foramen ovale was identified. - Tricuspid valve: There was trivial regurgitation. - Pulmonic valve: There was trivial regurgitation. - Pulmonary arteries: Systolic pressure was within the normal range. - Inferior vena cava: The vessel was normal in size. - Pericardium, extracardiac: There was no pericardial effusion.  Impressions: - There is severe LV dilatation with moderate concentric LVH and severe systolic function impairement (LVEF = 15-20%). There is restrictive pattern of diastolic function with elevated filling pressures with significant left to right bowing of the interventricular and interatrial septum.  Left cardiac cath : 03/06/2015 1. Mid to dist LAD lesion, 30% stenosed.   Dilated and globally hypocontractile left ventricle with elevated left ventricular end-diastolic (24 mmHg) and pulmonary capillary wedge (19 mmHg) pressures.  Left ventricular ejection fraction 15-20%.  Widely patent coronary arteries with mid to distal LAD less than 30% narrowing.    Assessment and Plan:   54 year old male   1. Acute on chronic combined chronic systolic and diastolic CHF - NYHA IIB-III - euvolemic, continue the same regimen, but decrease lisinopril to 5mg  as he is hypotensive - check CMP, BNP today  2. Non-ischemic CMP - LVEF 15-20%, severely dilated LV, continue lisinopril, carvedilol, aspirin, spironolactone and lasix PRN.   3. NIDDM - we will refill metformin 500 mg po bid  4. Erectile dysfunction - patient was prescribed Viagra, however unaffordable, history and contact his interest Co[employer] there any cheaper options. He is educated about never using it with nitrates.  Follow up in 3 month.  Check compressive metabolic, BNP profile today.  Ena Dawley H. 05/04/2015

## 2015-05-04 NOTE — Patient Instructions (Signed)
Medication Instructions:   DECREASE YOUR LISINOPRIL TO 5 MG ONCE DAILY    Labwork:  TODAY---CMET AND BNP     Follow-Up:  3 MONTHS WITH DR [name]

## 2015-05-05 ENCOUNTER — Telehealth: Payer: Self-pay | Admitting: *Deleted

## 2015-05-05 NOTE — Telephone Encounter (Signed)
Phone call from patient, reviewed lab results from yesterday's visit with him.  He would like Dr. [name] to confirm whether she thinks he should or should not work right now and long term. He has been asked to come back to his old employer and wants to know if he should.  He would hate to say he can work and then call out on him. He is not sure he can do the job. Pt states that he and Dr. [name] discussed this at yesterday's visit, but he is not sure what the outcome of the discussion was.  He is aware that I am sending to Dr. [name] and someone will call him back with reply either today or next week.  He is appreciative for the call.

## 2015-05-05 NOTE — Telephone Encounter (Signed)
Spoke with pt and gave him information from Dr. [name]. He does not think there is paperwork to be filled out at this time.

## 2015-05-05 NOTE — Telephone Encounter (Signed)
Left message to call back  

## 2015-05-05 NOTE — Telephone Encounter (Signed)
I dont think that he should work, his employer should send [place] [employer]

## 2015-05-16 DIAGNOSIS — E119 Type 2 diabetes mellitus without complications: Secondary | ICD-10-CM | POA: Insufficient documentation

## 2015-05-23 ENCOUNTER — Other Ambulatory Visit: Payer: Self-pay | Admitting: Cardiology

## 2015-05-23 MED ORDER — CARVEDILOL 12.5 MG PO TABS: 12.5000 mg | ORAL_TABLET | Freq: Two times a day (BID) | ORAL | Status: DC

## 2015-05-23 NOTE — Telephone Encounter (Signed)
David Spark, MD at 05/04/2015 11:53 AM  carvedilol (COREG) 12.5 MG tablet, Take 12.5 mg by mouth 2 (two) times daily with a meal Non-ischemic CMP - LVEF 15-20%, severely dilated LV, continue lisinopril, carvedilol, aspirin, spironolactone and lasix PRN

## 2015-06-01 ENCOUNTER — Ambulatory Visit (HOSPITAL_BASED_OUTPATIENT_CLINIC_OR_DEPARTMENT_OTHER): Payer: Medicaid Other | Attending: Internal Medicine

## 2015-06-01 VITALS — Ht 72.0 in | Wt 258.0 lb

## 2015-06-01 DIAGNOSIS — R5383 Other fatigue: Secondary | ICD-10-CM | POA: Insufficient documentation

## 2015-06-01 DIAGNOSIS — I491 Atrial premature depolarization: Secondary | ICD-10-CM | POA: Insufficient documentation

## 2015-06-01 DIAGNOSIS — R0683 Snoring: Secondary | ICD-10-CM | POA: Diagnosis not present

## 2015-06-01 DIAGNOSIS — I493 Ventricular premature depolarization: Secondary | ICD-10-CM | POA: Insufficient documentation

## 2015-06-01 DIAGNOSIS — Z6837 Body mass index (BMI) 37.0-37.9, adult: Secondary | ICD-10-CM | POA: Insufficient documentation

## 2015-06-01 DIAGNOSIS — I10 Essential (primary) hypertension: Secondary | ICD-10-CM | POA: Insufficient documentation

## 2015-06-01 DIAGNOSIS — R5382 Chronic fatigue, unspecified: Secondary | ICD-10-CM

## 2015-06-01 DIAGNOSIS — G4719 Other hypersomnia: Secondary | ICD-10-CM | POA: Diagnosis not present

## 2015-06-01 DIAGNOSIS — G4733 Obstructive sleep apnea (adult) (pediatric): Secondary | ICD-10-CM | POA: Insufficient documentation

## 2015-06-01 DIAGNOSIS — R400 Somnolence: Secondary | ICD-10-CM

## 2015-06-01 DIAGNOSIS — E669 Obesity, unspecified: Secondary | ICD-10-CM | POA: Insufficient documentation

## 2015-06-01 DIAGNOSIS — J9600 Acute respiratory failure, unspecified whether with hypoxia or hypercapnia: Secondary | ICD-10-CM

## 2015-06-01 DIAGNOSIS — Z8669 Personal history of other diseases of the nervous system and sense organs: Secondary | ICD-10-CM

## 2015-06-04 ENCOUNTER — Telehealth: Payer: Self-pay | Admitting: Cardiology

## 2015-06-04 ENCOUNTER — Encounter (HOSPITAL_BASED_OUTPATIENT_CLINIC_OR_DEPARTMENT_OTHER): Payer: Self-pay

## 2015-06-04 DIAGNOSIS — G4733 Obstructive sleep apnea (adult) (pediatric): Secondary | ICD-10-CM

## 2015-06-04 HISTORY — DX: Obstructive sleep apnea (adult) (pediatric): G47.33

## 2015-06-04 NOTE — Sleep Study (Signed)
   Patient Name: David Bright, David Bright MRN: [id number] Study Date: 06/01/2015 Gender: Male D.O.B: [birth year] Age (years): 54 Referring Provider: Virl Axe Interpreting Physician: Fransico Him MD, ABSM RPSGT: Baxter Flattery  Weight (lbs): 258 Height (inches): 70 BMI: 37 Neck Size: 18.00  CLINICAL INFORMATION Sleep Study Type: NPSG Indication for sleep study: Excessive Daytime Sleepiness, Fatigue, Hypertension, Obesity, OSA, Witnessed Apneas Epworth Sleepiness Score: 15  SLEEP STUDY TECHNIQUE As per the AASM Manual for the Scoring of Sleep and Associated Events v2.3 (April 2016) with a hypopnea requiring 4% desaturations. The channels recorded and monitored were frontal, central and occipital EEG, electrooculogram (EOG), submentalis EMG (chin), nasal and oral airflow, thoracic and abdominal wall motion, anterior tibialis EMG, snore microphone, electrocardiogram, and pulse oximetry.  MEDICATIONS Patient's medications include: ASA, Carvedilol, furosemide, lisinopril, lovastatin, metformin, potassium, spironolactone. Medications self-administered by patient during sleep study : No sleep medicine administered.  SLEEP ARCHITECTURE The study was initiated at 11:05:48 PM and ended at 5:06:08 AM. Sleep onset time was prolonged at 51.7 minutes and the sleep efficiency was reduced at 68.9%. The total sleep time was 248.1 minutes. Stage REM latency was prolonged at 154.5 minutes. The patient spent 5.64% of the night in stage N1 sleep, 75.82% in stage N2 sleep, 0.00% in stage N3 and 18.54% in REM. Alpha intrusion was absent. Supine sleep was 42.46%.  RESPIRATORY PARAMETERS The overall apnea/hypopnea index (AHI) was 12.3 per hour. There were 19 total apneas, including 19 obstructive, 0 central and 0 mixed apneas. There were 32 hypopneas and 0 RERAs. The AHI during Stage REM sleep was 1.3 per hour. AHI while supine was 24.5 per hour. The mean oxygen saturation was 96.12%. The minimum SpO2  during sleep was 89.00%. Loud snoring was noted during this study.  CARDIAC DATA The 2 lead EKG demonstrated normal sinus rhythm with intraventricular conduction delay. The mean heart rate was 68.59 beats per minute. Other EKG findings include: PVCs and PACs.  LEG MOVEMENT DATA The total PLMS were 1 with a resulting PLMS index of 0.24. Associated arousal with leg movement index was 0.0 .  IMPRESSIONS - Mild obstructive sleep apnea occurred during this study (AHI = 12.3/h). - No significant central sleep apnea occurred during this study (CAI = 0.0/h). - The patient had minimal or no oxygen desaturation during the study (Min O2 = 89.00%) - The patient snored with Loud snoring volume. - EKG findings include PVCs and PACs. - Clinically significant periodic limb movements did not occur during sleep. No significant associated arousals.  DIAGNOSIS - Obstructive Sleep Apnea (327.23 [G47.33 ICD-10])  RECOMMENDATIONS - Given elevated Epworth sleepiness score, severe snoring and severe OSA during REM sleep, recommend therapeutic CPAP titration to determine optimal pressure required to alleviate sleep disordered breathing. - Positional therapy avoiding supine position during sleep. - Avoid alcohol, sedatives and other CNS depressants that may worsen sleep apnea and disrupt normal sleep architecture. - Sleep hygiene should be reviewed to assess factors that may improve sleep quality. - Weight management and regular exercise should be initiated or continued if appropriate.   [place], American Board of Sleep Medicine  ELECTRONICALLY SIGNED ON:  06/04/2015, 8:57 PM [place] PH: [phone]   FX: [phone] [place]

## 2015-06-04 NOTE — Telephone Encounter (Signed)
Please let patient know that they have sleep apnea and recommend CPAP titration. Please set up titration in the sleep lab. 

## 2015-06-09 NOTE — Telephone Encounter (Signed)
Left message for pt to call back  °

## 2015-06-12 NOTE — Addendum Note (Signed)
Addended by: Andres Ege on: 06/12/2015 02:37 PM   Modules accepted: Orders

## 2015-06-12 NOTE — Telephone Encounter (Signed)
Patient informed of results. Stated verbal understanding. Will schedule Titration in the lab and mail patient a letter with date. Pt informed to look for letter in the mail.

## 2015-06-15 ENCOUNTER — Encounter: Payer: Self-pay | Admitting: Internal Medicine

## 2015-06-16 ENCOUNTER — Encounter: Payer: Self-pay | Admitting: *Deleted

## 2015-06-20 ENCOUNTER — Encounter: Payer: Self-pay | Admitting: Internal Medicine

## 2015-06-20 ENCOUNTER — Ambulatory Visit (INDEPENDENT_AMBULATORY_CARE_PROVIDER_SITE_OTHER): Payer: Medicaid Other | Admitting: Internal Medicine

## 2015-06-20 VITALS — BP 132/64 | HR 85 | Ht 72.0 in | Wt 248.4 lb

## 2015-06-20 DIAGNOSIS — Z95810 Presence of automatic (implantable) cardiac defibrillator: Secondary | ICD-10-CM | POA: Diagnosis not present

## 2015-06-20 DIAGNOSIS — I5022 Chronic systolic (congestive) heart failure: Secondary | ICD-10-CM | POA: Diagnosis not present

## 2015-06-20 DIAGNOSIS — I429 Cardiomyopathy, unspecified: Secondary | ICD-10-CM

## 2015-06-20 DIAGNOSIS — I428 Other cardiomyopathies: Secondary | ICD-10-CM

## 2015-06-20 NOTE — Progress Notes (Signed)
Patient Care Team: Pcp Not In System as PCP - General Renella Cunas, MD (Cardiology) Liliane Shi, PA-C as Physician Assistant (Physician Assistant)   HPI  David Bright is a 54 y.o. male Seen in follow-up for ICD implanted 7/16 for primary prevention in the setting of nonischemic cardiomyopathy congestive heart failure and history of polysubstance abuse.  Euvolemic continue current meds   Records and Results Reviewed :  ;abs  K Cr normal 9/16  Past Medical History  Diagnosis Date  . CHF (congestive heart failure) (HCC)     Systolic and diastolic  . COPD (chronic obstructive pulmonary disease) ([place])   . Type II diabetes mellitus ([place])   . Hypertension   . Sleep apnea     Nocturnal CPAP  . Hyperlipidemia   . Non compliance with medical treatment   . Polysubstance abuse     History of quitting alcohol, marijuana and crack over 12 years ago  . Non-ischemic cardiomyopathy ([place])     a. Normal cath 2002;  b. 01/2015 Echo: EF 15-20%, mod conc LVH, restrictive physiology, mod MR, sev dil LA, triv TR/PI;  c. 02/2015 Cath: LM nl, LAD 30d, LCX nl, OM1 small, nl, RCA nl;  d. 02/2015 s/p MDT single lead AICD (ser# [id number]).  . OSA on CPAP 11/09/2011  . HTN (hypertension) 11/09/2011  . DM (diabetes mellitus) ([place]) 11/09/2011  . OSA (obstructive sleep apnea) 06/04/2015    Mild with AHI 12.3/hr and AHI 24.5/hr in REM sleep.    Past Surgical History  Procedure Laterality Date  . None    . Cardiac catheterization N/A 03/06/2015    Procedure: Right/Left Heart Cath and Coronary Angiography;  Surgeon: Belva Crome, MD;  Location: [facility];  Service: Cardiovascular;  Laterality: N/A;  . Ep implantable device N/A 03/06/2015    Procedure: ICD Implant;  Surgeon: Deboraha Sprang, MD;  Location: [facility];  Service: Cardiovascular;  Laterality: N/A;    Current Outpatient Prescriptions  Medication Sig Dispense Refill  . aspirin EC 81 MG tablet Take 1 tablet (81 mg  total) by mouth daily. 30 tablet 0  . canagliflozin (INVOKANA) 300 MG TABS tablet Take 300 mg by mouth daily before breakfast.    . carvedilol (COREG) 12.5 MG tablet Take 1 tablet (12.5 mg total) by mouth 2 (two) times daily with a meal. 180 tablet 0  . furosemide (LASIX) 40 MG tablet Take 1 tablet (40 mg total) by mouth 2 (two) times daily. 180 tablet 6  . lisinopril (PRINIVIL,ZESTRIL) 5 MG tablet Take 1 tablet (5 mg total) by mouth daily. 90 tablet 3  . lovastatin (MEVACOR) 40 MG tablet Take 40 mg by mouth daily.    . metFORMIN (GLUCOPHAGE) 500 MG tablet Take 1 tablet (500 mg total) by mouth 2 (two) times daily with a meal. 60 tablet 3  . potassium chloride SA (K-DUR,KLOR-CON) 20 MEQ tablet Take 1 tablet (20 mEq total) by mouth daily. 90 tablet 3  . sildenafil (REVATIO) 20 MG tablet Take 2-4 tablets by mouth as needed.    . sitaGLIPtin-metformin (JANUMET) 50-1000 MG tablet Take 1 tablet by mouth 2 (two) times daily.    Marland Kitchen spironolactone (ALDACTONE) 25 MG tablet Take 1 tablet (25 mg total) by mouth daily. 90 tablet 3   No current facility-administered medications for this visit.    No Known Allergies    Review of Systems negative except from HPI and PMH  Physical Exam  BP 132/64 mmHg  Pulse 85  Ht 6' (1.829 m)  Wt 248 lb 6.4 oz (112.674 kg)  BMI 33.68 kg/m2  SpO2 97% Well developed and well nourished in no acute distress HENT normal E scleral and icterus clear Neck Supple JVP flat; carotids brisk and full Clear to ausculation Device pocket well healed; without hematoma or erythema.  There is no tethering   Regular rate and rhythm, no murmurs gallops or rub Soft with active bowel sounds No clubbing cyanosis  Edema Alert and oriented, grossly normal motor and sensory function Skin Warm and Dry  ECG demonstrates sinus rhythm at 85 Intervals 17/12/42  Assessment and  Plan  Obstructive sleep apnea-previously treated  Implantable defibrillator The patient's device was  interrogated.  The information was reviewed. No changes were made in the programming.     Cardiomyopathy presumed nonischemic with some regional wall motion abnormalities ejection fraction 15-20%  Right axis deviation/biatrial enlargement    Congestive heart failure-chronic-systolic Euvolemic continue current meds

## 2015-06-20 NOTE — Patient Instructions (Signed)
Medication Instructions: - no changes  Labwork: - none  Procedures/Testing: - none  Follow-Up: - Remote monitoring is used to monitor your Pacemaker of ICD from home. This monitoring reduces the number of office visits required to check your device to one time per year. It allows [place] to keep an eye on the functioning of your device to ensure it is working properly. You are scheduled for a device check from home on 09/19/15. You may send your transmission at any time that day. If you have a wireless device, the transmission will be sent automatically. After your physician reviews your transmission, you will receive a postcard with your next transmission date.  - Your physician wants you to follow-up in: 9 months with Chanetta Marshall, NP for Dr. Caryl Comes. You will receive a reminder letter in the mail two months in advance. If you don't receive a letter, please call our office to schedule the follow-up appointment.  Any Additional Special Instructions Will Be Listed Below (If Applicable). - none

## 2015-06-21 LAB — CUP PACEART INCLINIC DEVICE CHECK
Battery Remaining Longevity: 137 mo
Battery Voltage: 3.14 V
Date Time Interrogation Session: 20161108193651
HighPow Impedance: 71 Ohm
Implantable Lead Implant Date: 20160725
Implantable Lead Model: 293
Lead Channel Impedance Value: 380 Ohm
Lead Channel Pacing Threshold Amplitude: 0.75 V
Lead Channel Pacing Threshold Pulse Width: 0.4 ms
Lead Channel Setting Pacing Amplitude: 2.5 V
Lead Channel Setting Pacing Pulse Width: 0.4 ms
Lead Channel Setting Sensing Sensitivity: 0.3 mV
MDC IDC LEAD LOCATION: 753860
MDC IDC LEAD SERIAL: 383593
MDC IDC MSMT LEADCHNL RV IMPEDANCE VALUE: 380 Ohm
MDC IDC MSMT LEADCHNL RV SENSING INTR AMPL: 19.125 mV
MDC IDC STAT BRADY RV PERCENT PACED: 0.01 %

## 2015-07-04 DIAGNOSIS — I10 Essential (primary) hypertension: Secondary | ICD-10-CM | POA: Insufficient documentation

## 2015-07-19 ENCOUNTER — Ambulatory Visit (HOSPITAL_BASED_OUTPATIENT_CLINIC_OR_DEPARTMENT_OTHER): Payer: Medicaid Other | Attending: Cardiology | Admitting: *Deleted

## 2015-07-19 DIAGNOSIS — Z79899 Other long term (current) drug therapy: Secondary | ICD-10-CM | POA: Insufficient documentation

## 2015-07-19 DIAGNOSIS — I493 Ventricular premature depolarization: Secondary | ICD-10-CM | POA: Diagnosis not present

## 2015-07-19 DIAGNOSIS — R0683 Snoring: Secondary | ICD-10-CM | POA: Diagnosis not present

## 2015-07-19 DIAGNOSIS — G4733 Obstructive sleep apnea (adult) (pediatric): Secondary | ICD-10-CM | POA: Diagnosis not present

## 2015-07-25 ENCOUNTER — Telehealth: Payer: Self-pay | Admitting: Cardiology

## 2015-07-25 NOTE — Telephone Encounter (Signed)
Pt had successful PAP titration. Please setup appointment in 10 weeks. Please let [facility] know that order for PAP is in EPIC.   

## 2015-07-25 NOTE — Sleep Study (Signed)
    Patient Name: David Bright, David Bright MRN: [id number] Study Date: 07/19/2015 Gender: Male D.O.B: 23-Apr-1961 Age (years): 17 Referring Provider: Virl Axe Interpreting Physician: Fransico Him MD, ABSM RPSGT: [place]er, Holly  Weight (lbs): 258 Height (inches): 70 BMI: 37 Neck Size: 18.00  CLINICAL INFORMATION The patient is referred for a CPAP titration to treat sleep apnea.   SLEEP STUDY TECHNIQUE As per the AASM Manual for the Scoring of Sleep and Associated Events v2.3 (April 2016) with a hypopnea requiring 4% desaturations. The channels recorded and monitored were frontal, central and occipital EEG, electrooculogram (EOG), submentalis EMG (chin), nasal and oral airflow, thoracic and abdominal wall motion, anterior tibialis EMG, snore microphone, electrocardiogram, and pulse oximetry. Continuous positive airway pressure (CPAP) was initiated at the beginning of the study and titrated to treat sleep-disordered breathing.  MEDICATIONS Medications taken by the patient : ASA, Invokana, Coreg, Lasix, Lisinopril, Lovastatin, Metformin, KCl, Aldactone, Sildenafil, Janumet Medications administered by patient during sleep study : No sleep medicine administered.  TECHNICIAN COMMENTS Comments added by technician: none  Comments added by scorer: N/A  RESPIRATORY PARAMETERS Optimal PAP Pressure (cm): 9  AHI at Optimal Pressure (/hr):0.0 Overall Minimal O2 (%):91.00   Supine % at Optimal Pressure (%):71 Minimal O2 at Optimal Pressure (%):93.0    SLEEP ARCHITECTURE The study was initiated at 10:47:01 PM and ended at 4:57:24 AM. Sleep onset time was 23.3 minutes and the sleep efficiency was 84.9%. The total sleep time was 314.5 minutes. The patient spent 3.82% of the night in stage N1 sleep, 77.58% in stage N2 sleep, 0.00% in stage N3 and 18.60% in REM.Stage REM latency was 72.5 minutes Wake after sleep onset was 32.6. Alpha intrusion was absent. Supine sleep was 60.25%.  CARDIAC  DATA The 2 lead EKG demonstrated pacemaker generated. The mean heart rate was 80.06 beats per minute. Other EKG findings include: PVCs.  LEG MOVEMENT DATA The total Periodic Limb Movements of Sleep (PLMS) were 0. The PLMS index was 0.00. A PLMS index of <15 is considered normal in adults.  IMPRESSIONS - The optimal PAP pressure was 9 cm of water. - Central sleep apnea was not noted during this titration (CAI = 0.0/h). - Significant oxygen desaturations were not observed during this titration (min O2 = 91.00%). - The patient snored with Soft snoring volume during this titration study. - 2-lead EKG demonstrated: PVCs - Clinically significant periodic limb movements were not noted during this study. Arousals associated with PLMs were rare.  DIAGNOSIS - Obstructive Sleep Apnea (327.23 [G47.33 ICD-10])  RECOMMENDATIONS - Trial of CPAP therapy on 9 cm H2O with a Medium size Fisher&Paykel Full Face Mask Simplus mask and heated humidification. - Avoid alcohol, sedatives and other CNS depressants that may worsen sleep apnea and disrupt normal sleep architecture. - Sleep hygiene should be reviewed to assess factors that may improve sleep quality. - Weight management and regular exercise should be initiated or continued. - Return to Sleep Center for re-evaluation after 10 weeks of therapy   [place], American Board of Sleep Medicine  ELECTRONICALLY SIGNED ON:  07/25/2015, 7:50 PM [place] PH: [phone]   FX: [phone] [place]

## 2015-07-25 NOTE — Addendum Note (Signed)
Addended by: Sueanne Margarita on: 07/25/2015 07:55 PM   Modules accepted: Orders

## 2015-07-27 NOTE — Telephone Encounter (Signed)
Left message for patient to call back  

## 2015-08-03 NOTE — Telephone Encounter (Signed)
Patient informed of results. Stated verbal understanding.  Uncertain Notified.   Once he is set up with his machine we will scheduled 10 week followup.

## 2015-08-09 ENCOUNTER — Ambulatory Visit (INDEPENDENT_AMBULATORY_CARE_PROVIDER_SITE_OTHER): Payer: Medicaid Other | Admitting: Cardiology

## 2015-08-09 ENCOUNTER — Encounter: Payer: Self-pay | Admitting: Cardiology

## 2015-08-09 VITALS — BP 98/72 | HR 98 | Ht 70.0 in | Wt 257.8 lb

## 2015-08-09 DIAGNOSIS — Z95810 Presence of automatic (implantable) cardiac defibrillator: Secondary | ICD-10-CM | POA: Diagnosis not present

## 2015-08-09 DIAGNOSIS — I429 Cardiomyopathy, unspecified: Secondary | ICD-10-CM | POA: Diagnosis not present

## 2015-08-09 DIAGNOSIS — I5043 Acute on chronic combined systolic (congestive) and diastolic (congestive) heart failure: Secondary | ICD-10-CM

## 2015-08-09 DIAGNOSIS — I428 Other cardiomyopathies: Secondary | ICD-10-CM

## 2015-08-09 DIAGNOSIS — Z9889 Other specified postprocedural states: Secondary | ICD-10-CM | POA: Insufficient documentation

## 2015-08-09 MED ORDER — IVABRADINE HCL 5 MG PO TABS: 5.0000 mg | ORAL_TABLET | Freq: Two times a day (BID) | ORAL | Status: DC

## 2015-08-09 NOTE — Progress Notes (Signed)
Patient ID: David Bright, male   DOB: July 24, 1961, 55 y.o.   MRN: [id number]    Primary Physician: Pcp Not In System Primary Cardiologist: KN  Chief Complaint: ICD  HPI A 54 year old male with h/o hypertension, NIDDM, OSA, hyperlipidemia and combined systolic and diastolic CHF. He has h/o non-ischemic cardiomyopathy, normal cath in 2003. The patient hasn't followed in the last two years as he didnn't have medical insurance and couldn't afford doctor's visits. As a consequence he ran out of some medications refill such as metformin, he doesn't have PCP. His last echocardiogram in 2011 showed severely dilated left ventricle with LVEF 25-30% and grade 2 diastolic dysfunction with elevated filling pressures. Normal RVSP. He started to follow again in 2015 with findings of severely dilated LV and LVEF 15-20%, he was started on optimal medical therapy with no improvement in LVEF on echocardiogram in June 2016. 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  08/09/2015 - underwent single chamber ICD placement in July 2016, interrogated in November and functioning properly, no firing. He continues to feel tired all the time and sleeps a lot.  No CP, DOE, palpitations. Denies LE edema, orthopnea or PND. NYHA II-III, gets SOB after walking 50 yards. NO cardiac rehab yet. Complaint with his meds, ocassional PND, no orthopnea.   Past Medical History  Diagnosis Date  . CHF (congestive heart failure) (HCC)     Systolic and diastolic  . COPD (chronic obstructive pulmonary disease) ([place])   . Type II diabetes mellitus ([place])   . Hypertension   . Sleep apnea     Nocturnal CPAP  . Hyperlipidemia   . Non compliance with medical treatment   . Polysubstance abuse     History of quitting alcohol, marijuana and crack over 12 years ago  . Non-ischemic cardiomyopathy ([place])     a. Normal cath 2002;  b. 01/2015 Echo: EF  15-20%, mod conc LVH, restrictive physiology, mod MR, sev dil LA, triv TR/PI;  c. 02/2015 Cath: LM nl, LAD 30d, LCX nl, OM1 small, nl, RCA nl;  d. 02/2015 s/p MDT single lead AICD (ser# [id number]).  . OSA on CPAP 11/09/2011  . HTN (hypertension) 11/09/2011  . DM (diabetes mellitus) ([place]) 11/09/2011  . OSA (obstructive sleep apnea) 06/04/2015    Mild with AHI 12.3/hr and AHI 24.5/hr in REM sleep.      Surgical History:  Past Surgical History  Procedure Laterality Date  . None    . Cardiac catheterization N/A 03/06/2015    Procedure: Right/Left Heart Cath and Coronary Angiography;  Surgeon: Belva Crome, MD;  Location: [facility];  Service: Cardiovascular;  Laterality: N/A;  . Ep implantable device N/A 03/06/2015    Procedure: ICD Implant;  Surgeon: Deboraha Sprang, MD;  Location: [facility];  Service: Cardiovascular;  Laterality: N/A;     Home Meds:  Current outpatient prescriptions:  .  ACCU-CHEK SOFTCLIX LANCETS lancets, as directed., Disp: , Rfl:  .  aspirin EC 81 MG tablet, Take 1 tablet (81 mg total) by mouth daily., Disp: 30 tablet, Rfl: 0 .  canagliflozin (INVOKANA) 300 MG TABS tablet, Take 300 mg by mouth daily before breakfast., Disp: , Rfl:  .  carvedilol (COREG) 12.5 MG tablet, Take 1 tablet (12.5 mg total) by mouth 2 (two) times daily with a meal., Disp: 180 tablet, Rfl: 0 .  furosemide (LASIX)  40 MG tablet, Take 1 tablet (40 mg total) by mouth 2 (two) times daily., Disp: 180 tablet, Rfl: 6 .  glucose blood (ACCU-CHEK AVIVA PLUS) test strip, as directed., Disp: , Rfl:  .  lisinopril (PRINIVIL,ZESTRIL) 5 MG tablet, Take 1 tablet (5 mg total) by mouth daily., Disp: 90 tablet, Rfl: 3 .  lovastatin (MEVACOR) 40 MG tablet, Take 40 mg by mouth daily., Disp: , Rfl:  .  metFORMIN (GLUCOPHAGE) 500 MG tablet, Take 1 tablet (500 mg total) by mouth 2 (two) times daily with a meal., Disp: 60 tablet, Rfl: 3 .  potassium chloride SA (K-DUR,KLOR-CON) 20 MEQ tablet, Take 1 tablet  (20 mEq total) by mouth daily., Disp: 90 tablet, Rfl: 3 .  sildenafil (REVATIO) 20 MG tablet, Take 2-4 tablets by mouth as needed., Disp: , Rfl:  .  sitaGLIPtin-metformin (JANUMET) 50-1000 MG tablet, Take 1 tablet by mouth 2 (two) times daily., Disp: , Rfl:  .  spironolactone (ALDACTONE) 25 MG tablet, Take 1 tablet (25 mg total) by mouth daily., Disp: 90 tablet, Rfl: 3  Allergies: No Known Allergies  Social History   Social History  . Marital Status: Single    Spouse Name: N/A  . Number of Children: N/A  . 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 Smoking status: Current Every Day Smoker -- 1.00 packs/day for 33 years    Types: Cigarettes  . Smokeless tobacco: Never Used  . Alcohol Use: No     Comment: QUIT IN 2000  . Drug Use: No     Comment: quit 12 years ago  . Sexual Activity: No   Other Topics Concern  . Not on file   Social History Narrative   Lives in [place], [place] with housemate.      Family History  Problem Relation Age of Onset  . Cancer Mother      ROS:  Please see the history of present illness.     All other systems reviewed and negative.    Physical Exam:   Blood pressure 98/72, pulse 98, height 5\' 10"  (1.778 m), weight 257 lb 12.8 oz (116.937 kg). General: Well developed, well nourished male in no acute distress. Head: Normocephalic, atraumatic, sclera non-icteric, no xanthomas, nares are without discharge. EENT: normal Lymph Nodes:  none Back: without scoliosis/kyphosis, no CVA tendersness Neck: Negative for carotid bruits. JVD 10 cm with positive HJR  Lungs: Clear bilaterally to auscultation without wheezes, rales, or rhonchi. Breathing is unlabored. Heart: RRR with S1 S2.And an elevated P2 P2 was palpable.   2/6 systolic murmur at the left upper sternal border and a 2/6 holosystolic murmur at the apex , rubs, or gallops appreciated. Abdomen: Soft, non-tender, non-distended with  normoactive bowel sounds. No hepatomegaly. No rebound/guarding. No obvious abdominal masses. Msk:  Strength and tone appear normal for age. Extremities: No clubbing or cyanosis.  Tr edema.  Distal pedal pulses are 2+ and equal bilaterally. Skin: Warm and Dry Neuro: Alert and oriented X 3. CN III-XII intact Grossly normal sensory and motor function . Psych:  Responds to questions appropriately with a normal affect.      Labs: Cardiac Enzymes No results for input(s): CKTOTAL, CKMB, TROPONINI in the last 72 hours. CBC Lab Results  Component Value Date   WBC 5.3 02/27/2015   HGB 15.3 03/06/2015   HCT 45.0 03/06/2015   MCV 88.8 02/27/2015   PLT 174.0 02/27/2015  PROTIME: No results for input(s): LABPROT, INR in the last 72 hours. Chemistry No results for input(s): NA, K, CL, CO2, BUN, CREATININE, CALCIUM, PROT, BILITOT, ALKPHOS, ALT, AST, GLUCOSE in the last 168 hours.  Invalid input(s): LABALBU Lipids Lab Results  Component Value Date   CHOL  05/22/2010    173        ATP III CLASSIFICATION:  <200     mg/dL   Desirable  200-239  mg/dL   Borderline High  >=240    mg/dL   High          HDL 42 05/22/2010   LDLCALC * 05/22/2010    116        Total Cholesterol/HDL:CHD Risk Coronary Heart Disease Risk Table                     Men   Women  1/2 Average Risk   3.4   3.3  Average Risk       5.0   4.4  2 X Average Risk   9.6   7.1  3 X Average Risk  23.4   11.0        Use the calculated Patient Ratio above and the CHD Risk Table to determine the patient's CHD Risk.        ATP III CLASSIFICATION (LDL):  <100     mg/dL   Optimal  100-129  mg/dL   Near or Above                    Optimal  130-159  mg/dL   Borderline  160-189  mg/dL   High  >190     mg/dL   Very High   TRIG 73 05/22/2010   BNP PRO B NATRIURETIC PEPTIDE (BNP)  Date/Time Value Ref Range Status  05/04/2015 12:32 PM 282.0* 0.0 - 100.0 pg/mL Final  01/26/2015 12:53 PM 447.0* 0.0 - 100.0 pg/mL Final    11/22/2014 12:13 PM 236.0* 0.0 - 100.0 pg/mL Final  04/27/2014 05:50 AM 1567.0* 0 - 125 pg/mL Final   EKG: Sinus rhythm at 75 Intervals 19/12/45 Axis CLVIII Biatrial enlargement  TTE: 02/08/15 - Left ventricle: The cavity size was severely dilated. There was moderate concentric hypertrophy. Systolic function was severely reduced. The estimated ejection fraction was in the range of 15-20%. Doppler parameters are consistent with restrictive physiology, indicative of decreased left ventricular diastolic compliance and/or increased left atrial pressure. Doppler parameters are consistent with both elevated ventricular end-diastolic filling pressure and elevated left atrial filling pressure. - Mitral valve: There was moderate regurgitation. - Left atrium: The atrium was severely dilated. - Right atrium: The atrium was normal in size. - Atrial septum: No defect or patent foramen ovale was identified. - Tricuspid valve: There was trivial regurgitation. - Pulmonic valve: There was trivial regurgitation. - Pulmonary arteries: Systolic pressure was within the normal range. - Inferior vena cava: The vessel was normal in size. - Pericardium, extracardiac: There was no pericardial effusion.  Impressions: - There is severe LV dilatation with moderate concentric LVH and severe systolic function impairement (LVEF = 15-20%). There is restrictive pattern of diastolic function with elevated filling pressures with significant left to right bowing of the interventricular and interatrial septum.  Left cardiac cath : 03/06/2015 1. Mid to dist LAD lesion, 30% stenosed.   Dilated and globally hypocontractile left ventricle with elevated left ventricular end-diastolic (24 mmHg) and pulmonary capillary wedge (19 mmHg) pressures.  Left ventricular ejection fraction 15-20%.  Widely patent coronary arteries with mid to distal LAD less than 30% narrowing.    Assessment and  Plan:   54 year old male   1. Acute on chronic combined chronic systolic and diastolic CHF - NYHA IIB-III - euvolemic, continue the same regimen - HR remains 98 at rest on Carvedilol 12.5 mg po BID, no room to increase as BP in 90'. We will add Ivabradin 5 mg po BID.  2. Non-ischemic CMP - LVEF 15-20%, severely dilated LV, continue lisinopril, carvedilol, aspirin, spironolactone and lasix PRN.  Repeat echocardiogram for LVEF re-evaluation.-refer to cardiac rehab.  3. NIDDM - we will refill metformin 500 mg po bid  4. Erectile dysfunction - patient was prescribed Viagra, however unaffordable, history and contact his interest Co. there any cheaper options. He is educated about never using it with nitrates.  Follow up in 3 month.   Ena Dawley H. 08/09/2015

## 2015-08-09 NOTE — Patient Instructions (Signed)
Medication Instructions:   START TAKING CORLANOR 5 MG TWICE DAILY WITH MEALS    Testing/Procedures:  Your physician has requested that you have an echocardiogram. Echocardiography is a painless test that uses sound waves to create images of your heart. It provides your doctor with information about the size and shape of your heart and how well your heart's chambers and valves are working. This procedure takes approximately one hour. There are no restrictions for this procedure.   You have been referred to CARDIAC REHAB PHASE I AT [facility]---THEY WILL CONTACT YOU TO HAVE THIS APPOINTMENT ARRANGED    Follow-Up:  3 MONTHS WITH DR [name]     If you need a refill on your cardiac medications before your next appointment, please call your pharmacy.

## 2015-08-10 ENCOUNTER — Telehealth (HOSPITAL_COMMUNITY): Payer: Self-pay | Admitting: *Deleted

## 2015-08-10 NOTE — Telephone Encounter (Signed)
Received signed MD order from Dr. [name].  Called and left message indicating referral had been seen.  Pt informed that Medicaid reimbursement and qualification for rehab to be sent for MD to review and return to rehab.  Once form completed and pt qualifies for reimbursement, pt would be notified. Cherre Huger, BSN

## 2015-08-17 ENCOUNTER — Telehealth: Payer: Self-pay | Admitting: Cardiology

## 2015-08-17 NOTE — Telephone Encounter (Signed)
Informed the pt exactly why he was placed on corlanor by reviewing Dr Francesca Oman assessment and plan from the last OV with the pt.  Pt states since he started the corlanor samples, he has noticed his symptoms improving.  Provided pt education on this drug.  Pt verbalized understanding and gracious for all the assistance provided.

## 2015-08-17 NOTE — Telephone Encounter (Signed)
New message      At last ov, Dr Harrington Challenger prescribed ivabradine 5mg  bid.  Pt want to know what is this medication for?

## 2015-08-17 NOTE — Telephone Encounter (Signed)
Left message for the pt to call back in regards to medication question.

## 2015-08-23 ENCOUNTER — Telehealth (HOSPITAL_COMMUNITY): Payer: Self-pay | Admitting: *Deleted

## 2015-08-23 NOTE — Telephone Encounter (Signed)
Pt returned call.  Pt prefers to attend cardiac rehab in [facility] where he resides.  Pt ok for [facility] staff to fax contact information to [facility].  Called and left message at [place]. Cherre Huger, BSN

## 2015-08-28 ENCOUNTER — Encounter (HOSPITAL_BASED_OUTPATIENT_CLINIC_OR_DEPARTMENT_OTHER): Payer: Medicaid Other

## 2015-08-28 ENCOUNTER — Ambulatory Visit (HOSPITAL_COMMUNITY): Payer: Medicaid Other | Attending: Cardiovascular Disease

## 2015-08-28 ENCOUNTER — Other Ambulatory Visit: Payer: Self-pay

## 2015-08-28 DIAGNOSIS — I517 Cardiomegaly: Secondary | ICD-10-CM | POA: Diagnosis not present

## 2015-08-28 DIAGNOSIS — E119 Type 2 diabetes mellitus without complications: Secondary | ICD-10-CM | POA: Diagnosis not present

## 2015-08-28 DIAGNOSIS — I428 Other cardiomyopathies: Secondary | ICD-10-CM

## 2015-08-28 DIAGNOSIS — I340 Nonrheumatic mitral (valve) insufficiency: Secondary | ICD-10-CM | POA: Diagnosis not present

## 2015-08-28 DIAGNOSIS — I429 Cardiomyopathy, unspecified: Secondary | ICD-10-CM | POA: Diagnosis not present

## 2015-09-05 DIAGNOSIS — F17200 Nicotine dependence, unspecified, uncomplicated: Secondary | ICD-10-CM | POA: Insufficient documentation

## 2015-09-05 DIAGNOSIS — J069 Acute upper respiratory infection, unspecified: Secondary | ICD-10-CM | POA: Insufficient documentation

## 2015-09-08 ENCOUNTER — Other Ambulatory Visit: Payer: Self-pay | Admitting: *Deleted

## 2015-09-08 MED ORDER — FUROSEMIDE 20 MG PO TABS: 40.0000 mg | ORAL_TABLET | Freq: Two times a day (BID) | ORAL | Status: DC

## 2015-09-14 ENCOUNTER — Other Ambulatory Visit: Payer: Self-pay | Admitting: Cardiology

## 2015-09-19 ENCOUNTER — Ambulatory Visit (INDEPENDENT_AMBULATORY_CARE_PROVIDER_SITE_OTHER): Payer: Medicaid Other | Admitting: *Deleted

## 2015-09-19 ENCOUNTER — Telehealth: Payer: Self-pay | Admitting: Cardiology

## 2015-09-19 DIAGNOSIS — I428 Other cardiomyopathies: Secondary | ICD-10-CM

## 2015-09-19 DIAGNOSIS — I429 Cardiomyopathy, unspecified: Secondary | ICD-10-CM

## 2015-09-19 NOTE — Progress Notes (Signed)
Remote ICD transmission.   

## 2015-09-19 NOTE — Telephone Encounter (Signed)
Spoke with pt and reminded pt of remote transmission that is due today. Pt verbalized understanding.   

## 2015-09-21 ENCOUNTER — Telehealth: Payer: Self-pay | Admitting: Cardiology

## 2015-09-21 NOTE — Telephone Encounter (Signed)
Walk in pt form- Housing paperwork-dropped off gave to [employer]

## 2015-09-25 ENCOUNTER — Telehealth: Payer: Self-pay | Admitting: Cardiology

## 2015-09-25 NOTE — Telephone Encounter (Signed)
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 He did apply for Disability , but was denied   Thanks

## 2015-09-25 NOTE — Telephone Encounter (Signed)
Spoke with pt and he states that he was denied disability. Pt has now hired a Chief Executive Officer and they were wanting him to call our office about possibly getting a letter about whether or not pt's heart condition would keep him from working. Advised pt that I would route this information to Dr. [name] and her nurse for review, advisement and follow up.

## 2015-09-26 ENCOUNTER — Encounter: Payer: Self-pay | Admitting: *Deleted

## 2015-09-26 NOTE — Telephone Encounter (Signed)
To Whom It May Concern,  [name] is currently under my care for nonischemic cardiomyopathy with severe left ventricular dysfunction and dilatation and LVEF 15-20%, that is not improving. The patient underwent implant of an implantable cardioverter defibrillator. The patient remains functional class NYHA III and is not able to perform full-time job. He is fairly limited with physical activity and can only do activities of daily living at a slow pace. I believe that he should qualify for a disability as there is poor prognosis for his condition in the future.  Please don't hesitate to call us with any questions.  Warm regards,  Ena Dawley M.D. Cardiologist [place]

## 2015-09-26 NOTE — Telephone Encounter (Signed)
Contacted the pt to inform him that Dr [name] wrote him a letter for approval of disability.  Pt request this to be mailed to his current residential address.  Confirmed the pts address to the one on file.  Informed the pt that I will mail this out to him today.  Pt verbalized understanding and very gracious for all the assistance provided.

## 2015-10-15 ENCOUNTER — Encounter: Payer: Self-pay | Admitting: Cardiology

## 2015-10-15 LAB — CUP PACEART REMOTE DEVICE CHECK
Battery Remaining Longevity: 135 mo
Battery Voltage: 3.1 V
Brady Statistic RV Percent Paced: 0.01 %
HighPow Impedance: 69 Ohm
Implantable Lead Implant Date: 20160725
Implantable Lead Location: 753860
Implantable Lead Model: 293
Lead Channel Impedance Value: 399 Ohm
Lead Channel Pacing Threshold Amplitude: 0.875 V
Lead Channel Pacing Threshold Pulse Width: 0.4 ms
Lead Channel Setting Pacing Amplitude: 2.5 V
Lead Channel Setting Sensing Sensitivity: 0.3 mV
MDC IDC LEAD SERIAL: 383593
MDC IDC MSMT LEADCHNL RV IMPEDANCE VALUE: 437 Ohm
MDC IDC MSMT LEADCHNL RV SENSING INTR AMPL: 16.75 mV
MDC IDC MSMT LEADCHNL RV SENSING INTR AMPL: 16.75 mV
MDC IDC SESS DTM: 20170207183438
MDC IDC SET LEADCHNL RV PACING PULSEWIDTH: 0.4 ms

## 2015-10-15 NOTE — Progress Notes (Signed)
Normal remote reviewed.  Next [facility] 12-19-15

## 2015-11-03 ENCOUNTER — Encounter: Payer: Self-pay | Admitting: Cardiology

## 2015-11-03 ENCOUNTER — Ambulatory Visit (INDEPENDENT_AMBULATORY_CARE_PROVIDER_SITE_OTHER): Payer: Medicaid Other | Admitting: Cardiology

## 2015-11-03 VITALS — BP 122/62 | HR 76 | Ht 70.0 in | Wt 257.0 lb

## 2015-11-03 DIAGNOSIS — I5023 Acute on chronic systolic (congestive) heart failure: Secondary | ICD-10-CM

## 2015-11-03 DIAGNOSIS — Z79899 Other long term (current) drug therapy: Secondary | ICD-10-CM

## 2015-11-03 DIAGNOSIS — I428 Other cardiomyopathies: Secondary | ICD-10-CM

## 2015-11-03 DIAGNOSIS — Z95810 Presence of automatic (implantable) cardiac defibrillator: Secondary | ICD-10-CM

## 2015-11-03 DIAGNOSIS — I5022 Chronic systolic (congestive) heart failure: Secondary | ICD-10-CM | POA: Diagnosis not present

## 2015-11-03 DIAGNOSIS — I429 Cardiomyopathy, unspecified: Secondary | ICD-10-CM | POA: Diagnosis not present

## 2015-11-03 LAB — CBC WITH DIFFERENTIAL/PLATELET
Basophils Absolute: 0 10*3/uL (ref 0.0–0.1)
Basophils Relative: 0 % (ref 0–1)
Eosinophils Absolute: 0.2 10*3/uL (ref 0.0–0.7)
Eosinophils Relative: 3 % (ref 0–5)
HCT: 42.2 % (ref 39.0–52.0)
Hemoglobin: 14.1 g/dL (ref 13.0–17.0)
Lymphocytes Relative: 19 % (ref 12–46)
Lymphs Abs: 1 10*3/uL (ref 0.7–4.0)
MCH: 29.7 pg (ref 26.0–34.0)
MCHC: 33.4 g/dL (ref 30.0–36.0)
MCV: 89 fL (ref 78.0–100.0)
MPV: 12.3 fL (ref 8.6–12.4)
Monocytes Absolute: 0.3 10*3/uL (ref 0.1–1.0)
Monocytes Relative: 6 % (ref 3–12)
Neutro Abs: 4 10*3/uL (ref 1.7–7.7)
Neutrophils Relative %: 72 % (ref 43–77)
Platelets: 237 10*3/uL (ref 150–400)
RBC: 4.74 MIL/uL (ref 4.22–5.81)
RDW: 13.8 % (ref 11.5–15.5)
WBC: 5.5 10*3/uL (ref 4.0–10.5)

## 2015-11-03 LAB — HEPATIC FUNCTION PANEL
ALT: 8 U/L — ABNORMAL LOW (ref 9–46)
AST: 12 U/L (ref 10–35)
Albumin: 3.9 g/dL (ref 3.6–5.1)
Alkaline Phosphatase: 57 U/L (ref 40–115)
Bilirubin, Direct: 0.1 mg/dL (ref <=0.2)
Indirect Bilirubin: 0.5 mg/dL (ref 0.2–1.2)
Total Bilirubin: 0.6 mg/dL (ref 0.2–1.2)
Total Protein: 7 g/dL (ref 6.1–8.1)

## 2015-11-03 LAB — BASIC METABOLIC PANEL
BUN: 17 mg/dL (ref 7–25)
CO2: 29 mmol/L (ref 20–31)
Calcium: 9.3 mg/dL (ref 8.6–10.3)
Chloride: 101 mmol/L (ref 98–110)
Creat: 1.03 mg/dL (ref 0.70–1.33)
Glucose, Bld: 142 mg/dL — ABNORMAL HIGH (ref 65–99)
Potassium: 4 mmol/L (ref 3.5–5.3)
Sodium: 140 mmol/L (ref 135–146)

## 2015-11-03 LAB — TSH: TSH: 0.72 mIU/L (ref 0.40–4.50)

## 2015-11-03 NOTE — Progress Notes (Signed)
Patient ID: David Bright, male   DOB: 1961-06-23, 55 y.o.   MRN: [id number]    Primary Cardiologist: Dorothy Spark, MD  Chief Complaint: ICD  HPI  A 55 year old male with h/o hypertension, NIDDM, OSA, hyperlipidemia and combined systolic and diastolic CHF. He has h/o non-ischemic cardiomyopathy, normal cath in 2003. The patient hasn't followed in the last two years as he didnn't have medical insurance and couldn't afford doctor's visits. As a consequence he ran out of some medications refill such as metformin, he doesn't have PCP. His last echocardiogram in 2011 showed severely dilated left ventricle with LVEF 25-30% and grade 2 diastolic dysfunction with elevated filling pressures. Normal RVSP. He started to follow again in 2015 with findings of severely dilated LV and LVEF 15-20%, he was started on optimal medical therapy with no improvement in LVEF on echocardiogram in June 2016. 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  11/03/2015 - patient is coming after 3 months, he is overall doing okay but having more episodes of shortness of breath when he took an additional surgery Lasix of 40 mg daily. He denies any lower extremity edema and only has occasional proximal nocturnal dyspnea. His weight is stable at 257 pounds as 3 months ago. He hasn't had any ICD firing transmission was normal. No palpitations or syncope. He denies any chest pain. He is compliant with his meds. He ran out of Ivabradin as pharmacy didn't have it available.  Past Medical History  Diagnosis Date  . CHF (congestive heart failure) (HCC)     Systolic and diastolic  . COPD (chronic obstructive pulmonary disease) ([place])   . Type II diabetes mellitus ([place])   . Hypertension   . Sleep apnea     Nocturnal CPAP  . Hyperlipidemia   . Non compliance with medical treatment   . Polysubstance abuse     History of quitting  alcohol, marijuana and crack over 12 years ago  . Non-ischemic cardiomyopathy ([place])     a. Normal cath 2002;  b. 01/2015 Echo: EF 15-20%, mod conc LVH, restrictive physiology, mod MR, sev dil LA, triv TR/PI;  c. 02/2015 Cath: LM nl, LAD 30d, LCX nl, OM1 small, nl, RCA nl;  d. 02/2015 s/p MDT single lead AICD (ser# [id number]).  . OSA on CPAP 11/09/2011  . HTN (hypertension) 11/09/2011  . DM (diabetes mellitus) ([place]) 11/09/2011  . OSA (obstructive sleep apnea) 06/04/2015    Mild with AHI 12.3/hr and AHI 24.5/hr in REM sleep.     Surgical History:  Past Surgical History  Procedure Laterality Date  . None    . Cardiac catheterization N/A 03/06/2015    Procedure: Right/Left Heart Cath and Coronary Angiography;  Surgeon: Belva Crome, MD;  Location: [facility];  Service: Cardiovascular;  Laterality: N/A;  . Ep implantable device N/A 03/06/2015    Procedure: ICD Implant;  Surgeon: Deboraha Sprang, MD;  Location: [facility];  Service: Cardiovascular;  Laterality: N/A;    Home Meds:  Current outpatient prescriptions:  .  ACCU-CHEK SOFTCLIX LANCETS lancets, as directed., Disp: , Rfl:  .  aspirin EC 81 MG tablet, Take 1 tablet (81 mg total) by mouth daily., Disp: 30 tablet, Rfl: 0 .  canagliflozin (INVOKANA) 300 MG TABS tablet, Take 300 mg by mouth daily before breakfast., Disp: , Rfl:  .  carvedilol (COREG) 12.5 MG  tablet, TAKE ONE TABLET BY MOUTH TWICE DAILY WITH  A  MEAL, Disp: 180 tablet, Rfl: 0 .  furosemide (LASIX) 20 MG tablet, Take 2 tablets (40 mg total) by mouth 2 (two) times daily., Disp: 120 tablet, Rfl: 3 .  glucose blood (ACCU-CHEK AVIVA PLUS) test strip, as directed., Disp: , Rfl:  .  ivabradine (CORLANOR) 5 MG TABS tablet, Take 1 tablet (5 mg total) by mouth 2 (two) times daily with a meal., Disp: 180 tablet, Rfl: 3 .  lisinopril (PRINIVIL,ZESTRIL) 5 MG tablet, Take 1 tablet (5 mg total) by mouth daily., Disp: 90 tablet, Rfl: 3 .  lovastatin (MEVACOR) 40 MG tablet, Take 40  mg by mouth daily., Disp: , Rfl:  .  metFORMIN (GLUCOPHAGE) 500 MG tablet, Take 1 tablet (500 mg total) by mouth 2 (two) times daily with a meal., Disp: 60 tablet, Rfl: 3 .  potassium chloride SA (K-DUR,KLOR-CON) 20 MEQ tablet, Take 1 tablet (20 mEq total) by mouth daily., Disp: 90 tablet, Rfl: 3 .  sildenafil (REVATIO) 20 MG tablet, Take 2-4 tablets by mouth as needed., Disp: , Rfl:  .  sitaGLIPtin-metformin (JANUMET) 50-1000 MG tablet, Take 1 tablet by mouth 2 (two) times daily., Disp: , Rfl:  .  spironolactone (ALDACTONE) 25 MG tablet, Take 1 tablet (25 mg total) by mouth daily., Disp: 90 tablet, Rfl: 3 .  furosemide (LASIX) 40 MG tablet, Take 1 tablet (40 mg total) by mouth 2 (two) times daily. (Patient not taking: Reported on 11/03/2015), Disp: 180 tablet, Rfl: 6  Allergies: No Known Allergies  Social History   Social History  . Marital Status: Single    Spouse Name: N/A  . Number of Children: N/A  . 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 Smoking status: Current Every Day Smoker -- 1.00 packs/day for 33 years    Types: Cigarettes  . Smokeless tobacco: Never Used  . Alcohol Use: No     Comment: QUIT IN 2000  . Drug Use: No     Comment: quit 12 years ago  . Sexual Activity: No   Other Topics Concern  . Not on file   Social History Narrative   Lives in [place], [place] with housemate.     Family History  Problem Relation Age of Onset  . Cancer Mother     ROS:  Please see the history of present illness.     All other systems reviewed and negative.   Physical Exam:   Blood pressure 122/62, pulse 76, height 5\' 10"  (1.778 m), weight 257 lb (116.574 kg). General: Well developed, well nourished male in no acute distress. Head: Normocephalic, atraumatic, sclera non-icteric, no xanthomas, nares are without discharge. EENT: normal Lymph Nodes:  none Back: without scoliosis/kyphosis, no CVA tendersness Neck:  Negative for carotid bruits. JVD 10 cm with positive HJR  Lungs: Clear bilaterally to auscultation without wheezes, rales, or rhonchi. Breathing is unlabored. Heart: RRR with S1 S2.And an elevated P2 P2 was palpable.   2/6 systolic murmur at the left upper sternal border and a 2/6 holosystolic murmur at the apex , rubs, or gallops appreciated. Abdomen: Soft, non-tender, non-distended with normoactive bowel sounds. No hepatomegaly. No rebound/guarding. No obvious abdominal masses. Msk:  Strength and tone appear normal for age. Extremities: No clubbing or cyanosis.  Tr edema.  Distal pedal pulses are 2+ and equal bilaterally. Skin: Warm and Dry Neuro: Alert and oriented  X 3. CN III-XII intact Grossly normal sensory and motor function . Psych:  Responds to questions appropriately with a normal affect.      CBC Lab Results  Component Value Date   WBC 5.3 02/27/2015   HGB 15.3 03/06/2015   HCT 45.0 03/06/2015   MCV 88.8 02/27/2015   PLT 174.0 02/27/2015   Lipids Lab Results  Component Value Date   CHOL  05/22/2010    173        ATP III CLASSIFICATION:  <200     mg/dL   Desirable  200-239  mg/dL   Borderline High  >=240    mg/dL   High          HDL 42 05/22/2010   LDLCALC * 05/22/2010    116        Total Cholesterol/HDL:CHD Risk Coronary Heart Disease Risk Table                     Men   Women  1/2 Average Risk   3.4   3.3  Average Risk       5.0   4.4  2 X Average Risk   9.6   7.1  3 X Average Risk  23.4   11.0        Use the calculated Patient Ratio above and the CHD Risk Table to determine the patient's CHD Risk.        ATP III CLASSIFICATION (LDL):  <100     mg/dL   Optimal  100-129  mg/dL   Near or Above                    Optimal  130-159  mg/dL   Borderline  160-189  mg/dL   High  >190     mg/dL   Very High   TRIG 73 05/22/2010   BNP PRO B NATRIURETIC PEPTIDE (BNP)  Date/Time Value Ref Range Status  05/04/2015 12:32 PM 282.0* 0.0 - 100.0 pg/mL Final    01/26/2015 12:53 PM 447.0* 0.0 - 100.0 pg/mL Final  11/22/2014 12:13 PM 236.0* 0.0 - 100.0 pg/mL Final  04/27/2014 05:50 AM 1567.0* 0 - 125 pg/mL Final   EKG: Sinus rhythm at 75 Intervals 19/12/45 Axis CLVIII Biatrial enlargement  TTE: 02/08/15 - Left ventricle: The cavity size was severely dilated. There was moderate concentric hypertrophy. Systolic function was severely reduced. The estimated ejection fraction was in the range of 15-20%. Doppler parameters are consistent with restrictive physiology, indicative of decreased left ventricular diastolic compliance and/or increased left atrial pressure. Doppler parameters are consistent with both elevated ventricular end-diastolic filling pressure and elevated left atrial filling pressure. - Mitral valve: There was moderate regurgitation. - Left atrium: The atrium was severely dilated. - Right atrium: The atrium was normal in size. - Atrial septum: No defect or patent foramen ovale was identified. - Tricuspid valve: There was trivial regurgitation. - Pulmonic valve: There was trivial regurgitation. - Pulmonary arteries: Systolic pressure was within the normal range. - Inferior vena cava: The vessel was normal in size. - Pericardium, extracardiac: There was no pericardial effusion.  Impressions: - There is severe LV dilatation with moderate concentric LVH and severe systolic function impairement (LVEF = 15-20%). There is restrictive pattern of diastolic function with elevated filling pressures with significant left to right bowing of the interventricular and interatrial septum.  Left cardiac cath : 03/06/2015 1. Mid to dist LAD lesion, 30% stenosed.   Dilated and globally hypocontractile left  ventricle with elevated left ventricular end-diastolic (24 mmHg) and pulmonary capillary wedge (19 mmHg) pressures.  Left ventricular ejection fraction 15-20%.  Widely patent coronary arteries with mid to  distal LAD less than 30% narrowing.   Assessment and Plan:   55 year old male   1. Acute on chronic combined chronic systolic and diastolic CHF - NYHA IIB-III - euvolemic, Lasix 40 mg po BID, an extra lasix if worsening DOE or LE edema - check CMP, BNP today - HR remains > 70, he ran our of Corlanor, we will refill and give free samples - disability paperwork was filled  2. Non-ischemic CMP - repeat echo showed LVEF 20%, severely dilated LV, continue lisinopril, carvedilol, aspirin, spironolactone, Corlanor and lasix 40 mg po BID   3. NIDDM - continue metformin 500 mg po bid  4. S/P ICD placement - no signs of infection, transmission normal  5. Erectile dysfunction - patient was prescribed Viagra, however unaffordable, he might reconsider in the future.  Follow up in 3 month. Check CMP, BNP today.  Ena Dawley H. 11/03/2015

## 2015-11-03 NOTE — Patient Instructions (Signed)
Medication Instructions:  Your physician recommends that you continue on your current medications as directed. Please refer to the Current Medication list given to you today.   Labwork: TODAY  BMET  BNP  CBC  TSH  LIVER   Testing/Procedures:   Follow-Up:  Your physician recommends that you schedule a follow-up appointment in:   3 MONTHS  WITH  DR  [name] Any Other Special Instructions Will Be Listed Below (If Applicable).     If you need a refill on your cardiac medications before your next appointment, please call your pharmacy.

## 2015-11-04 LAB — BRAIN NATRIURETIC PEPTIDE: Brain Natriuretic Peptide: 40.9 pg/mL (ref <100)

## 2015-12-19 ENCOUNTER — Ambulatory Visit (INDEPENDENT_AMBULATORY_CARE_PROVIDER_SITE_OTHER): Payer: Medicaid Other | Admitting: *Deleted

## 2015-12-19 DIAGNOSIS — I429 Cardiomyopathy, unspecified: Secondary | ICD-10-CM

## 2015-12-19 DIAGNOSIS — I428 Other cardiomyopathies: Secondary | ICD-10-CM

## 2015-12-19 DIAGNOSIS — Z95810 Presence of automatic (implantable) cardiac defibrillator: Secondary | ICD-10-CM

## 2015-12-20 NOTE — Progress Notes (Signed)
Remote ICD transmission.   

## 2015-12-25 ENCOUNTER — Other Ambulatory Visit: Payer: Self-pay | Admitting: Cardiology

## 2015-12-25 NOTE — Telephone Encounter (Signed)
REFILL 

## 2015-12-28 ENCOUNTER — Other Ambulatory Visit: Payer: Self-pay | Admitting: Cardiology

## 2016-01-08 DIAGNOSIS — H40053 Ocular hypertension, bilateral: Secondary | ICD-10-CM | POA: Insufficient documentation

## 2016-01-08 DIAGNOSIS — H25042 Posterior subcapsular polar age-related cataract, left eye: Secondary | ICD-10-CM | POA: Insufficient documentation

## 2016-01-08 DIAGNOSIS — H2513 Age-related nuclear cataract, bilateral: Secondary | ICD-10-CM | POA: Insufficient documentation

## 2016-01-08 DIAGNOSIS — H52223 Regular astigmatism, bilateral: Secondary | ICD-10-CM | POA: Insufficient documentation

## 2016-01-08 DIAGNOSIS — H524 Presbyopia: Secondary | ICD-10-CM | POA: Insufficient documentation

## 2016-01-24 ENCOUNTER — Encounter: Payer: Self-pay | Admitting: Cardiology

## 2016-01-24 LAB — CUP PACEART REMOTE DEVICE CHECK
Battery Remaining Longevity: 134 mo
Battery Voltage: 3.07 V
Date Time Interrogation Session: 20170509073622
HIGH POWER IMPEDANCE MEASURED VALUE: 73 Ohm
Implantable Lead Implant Date: 20160725
Implantable Lead Model: 293
Implantable Lead Serial Number: 383593
Lead Channel Impedance Value: 399 Ohm
Lead Channel Pacing Threshold Pulse Width: 0.4 ms
Lead Channel Sensing Intrinsic Amplitude: 17.75 mV
Lead Channel Setting Pacing Amplitude: 2.5 V
Lead Channel Setting Pacing Pulse Width: 0.4 ms
Lead Channel Setting Sensing Sensitivity: 0.3 mV
MDC IDC LEAD LOCATION: 753860
MDC IDC MSMT LEADCHNL RV IMPEDANCE VALUE: 399 Ohm
MDC IDC MSMT LEADCHNL RV PACING THRESHOLD AMPLITUDE: 0.75 V
MDC IDC MSMT LEADCHNL RV SENSING INTR AMPL: 17.75 mV
MDC IDC STAT BRADY RV PERCENT PACED: 0.01 %

## 2016-01-26 ENCOUNTER — Other Ambulatory Visit: Payer: Self-pay | Admitting: Cardiology

## 2016-02-08 ENCOUNTER — Encounter: Payer: Self-pay | Admitting: Cardiology

## 2016-02-08 ENCOUNTER — Ambulatory Visit (INDEPENDENT_AMBULATORY_CARE_PROVIDER_SITE_OTHER): Payer: Medicaid Other | Admitting: Cardiology

## 2016-02-08 VITALS — BP 122/62 | HR 80 | Ht 70.0 in | Wt 251.0 lb

## 2016-02-08 DIAGNOSIS — I429 Cardiomyopathy, unspecified: Secondary | ICD-10-CM | POA: Diagnosis not present

## 2016-02-08 DIAGNOSIS — I5043 Acute on chronic combined systolic (congestive) and diastolic (congestive) heart failure: Secondary | ICD-10-CM | POA: Diagnosis not present

## 2016-02-08 DIAGNOSIS — I5042 Chronic combined systolic (congestive) and diastolic (congestive) heart failure: Secondary | ICD-10-CM

## 2016-02-08 DIAGNOSIS — I5022 Chronic systolic (congestive) heart failure: Secondary | ICD-10-CM

## 2016-02-08 DIAGNOSIS — I428 Other cardiomyopathies: Secondary | ICD-10-CM

## 2016-02-08 DIAGNOSIS — Z95810 Presence of automatic (implantable) cardiac defibrillator: Secondary | ICD-10-CM

## 2016-02-08 NOTE — Progress Notes (Signed)
Patient ID: David Bright, male   DOB: May 11, 1961, 55 y.o.   MRN: [id number]    Primary Cardiologist: Ena Dawley, MD  Chief Complaint: ICD  HPI  A 55 year old male with h/o hypertension, NIDDM, OSA, hyperlipidemia and combined systolic and diastolic CHF. He has h/o non-ischemic cardiomyopathy, normal cath in 2003. The patient hasn't followed in the last two years as he didnn't have medical insurance and couldn't afford doctor's visits. As a consequence he ran out of some medications refill such as metformin, he doesn't have PCP. His last echocardiogram in 2011 showed severely dilated left ventricle with LVEF 25-30% and grade 2 diastolic dysfunction with elevated filling pressures. Normal RVSP. He started to follow again in 2015 with findings of severely dilated LV and LVEF 15-20%, he was started on optimal medical therapy with no improvement in LVEF on echocardiogram in June 2016. 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  11/03/2015 -3 months follow-up, patient states that he has been feeling more short of breath in the last few days, he has on and off paroxysmal nocturnal dyspnea but not too frequently. She denies any significant lower extremity edema other than mild in the last few days otherwise no chest pain no palpitations or syncope. No ICD firing. He is being compliant to his medicines but stopped taking lovastatin as he developed significant muscle pain.   Past Medical History  Diagnosis Date  . CHF (congestive heart failure) (HCC)     Systolic and diastolic  . COPD (chronic obstructive pulmonary disease) ([place])   . Type II diabetes mellitus ([place])   . Hypertension   . Sleep apnea     Nocturnal CPAP  . Hyperlipidemia   . Non compliance with medical treatment   . Polysubstance abuse     History of quitting alcohol, marijuana and crack over 12 years ago  . Non-ischemic  cardiomyopathy ([place])     a. Normal cath 2002;  b. 01/2015 Echo: EF 15-20%, mod conc LVH, restrictive physiology, mod MR, sev dil LA, triv TR/PI;  c. 02/2015 Cath: LM nl, LAD 30d, LCX nl, OM1 small, nl, RCA nl;  d. 02/2015 s/p MDT single lead AICD (ser# [id number]).  . OSA on CPAP 11/09/2011  . HTN (hypertension) 11/09/2011  . DM (diabetes mellitus) ([place]) 11/09/2011  . OSA (obstructive sleep apnea) 06/04/2015    Mild with AHI 12.3/hr and AHI 24.5/hr in REM sleep.     Surgical History:  Past Surgical History  Procedure Laterality Date  . None    . Cardiac catheterization N/A 03/06/2015    Procedure: Right/Left Heart Cath and Coronary Angiography;  Surgeon: Belva Crome, MD;  Location: [facility];  Service: Cardiovascular;  Laterality: N/A;  . Ep implantable device N/A 03/06/2015    Procedure: ICD Implant;  Surgeon: Deboraha Sprang, MD;  Location: [facility];  Service: Cardiovascular;  Laterality: N/A;    Home Meds:  Current outpatient prescriptions:  .  ACCU-CHEK SOFTCLIX LANCETS lancets, as directed., Disp: , Rfl:  .  aspirin EC 81 MG tablet, Take 1 tablet (81 mg total) by mouth daily., Disp: 30 tablet, Rfl: 0 .  canagliflozin (INVOKANA) 300 MG TABS tablet, Take 300 mg by mouth daily before breakfast., Disp: , Rfl:  .  carvedilol (COREG) 12.5 MG tablet, TAKE ONE TABLET BY MOUTH TWICE DAILY WITH A MEAL, Disp: 180 tablet, Rfl: 2 .  furosemide (LASIX) 20 MG tablet, Take 2 tablets (40 mg total) by mouth 2 (two) times daily., Disp: 120 tablet, Rfl: 3 .  glucose blood (ACCU-CHEK AVIVA PLUS) test strip, as directed., Disp: , Rfl:  .  ivabradine (CORLANOR) 5 MG TABS tablet, Take 1 tablet (5 mg total) by mouth 2 (two) times daily with a meal., Disp: 180 tablet, Rfl: 3 .  lisinopril (PRINIVIL,ZESTRIL) 5 MG tablet, Take 1 tablet (5 mg total) by mouth daily., Disp: 90 tablet, Rfl: 3 .  potassium chloride SA (K-DUR,KLOR-CON) 20 MEQ tablet, Take 1 tablet (20 mEq total) by mouth daily., Disp: 90  tablet, Rfl: 3 .  sildenafil (REVATIO) 20 MG tablet, Take 2-4 tablets by mouth as needed., Disp: , Rfl:  .  sitaGLIPtin-metformin (JANUMET) 50-1000 MG tablet, Take 1 tablet by mouth 2 (two) times daily., Disp: , Rfl:  .  spironolactone (ALDACTONE) 25 MG tablet, TAKE ONE TABLET BY MOUTH ONCE DAILY, Disp: 90 tablet, Rfl: 0 .  furosemide (LASIX) 40 MG tablet, Take 1 tablet (40 mg total) by mouth 2 (two) times daily. (Patient not taking: Reported on 02/08/2016), Disp: 180 tablet, Rfl: 6 .  lovastatin (MEVACOR) 40 MG tablet, Take 40 mg by mouth daily. Reported on 02/08/2016, Disp: , Rfl:   Allergies: No Known Allergies  Social History   Social History  . Marital Status: Single    Spouse Name: N/A  . Number of Children: N/A  . 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 Smoking status: Current Every Day Smoker -- 1.00 packs/day for 33 years    Types: Cigarettes  . Smokeless tobacco: Never Used  . Alcohol Use: No     Comment: QUIT IN 2000  . Drug Use: No     Comment: quit 12 years ago  . Sexual Activity: No   Other Topics Concern  . Not on file   Social History Narrative   Lives in [place], [place] with housemate.     Family History  Problem Relation Age of Onset  . Cancer Mother     ROS:  Please see the history of present illness.     All other systems reviewed and negative.   Physical Exam:   Blood pressure 122/62, pulse 80, height 5\' 10"  (1.778 m), weight 251 lb (113.853 kg). General: Well developed, well nourished male in no acute distress. Head: Normocephalic, atraumatic, sclera non-icteric, no xanthomas, nares are without discharge. EENT: normal Lymph Nodes:  none Back: without scoliosis/kyphosis, no CVA tendersness Neck: Negative for carotid bruits. JVD 10 cm with positive HJR  Lungs: Clear bilaterally to auscultation without wheezes, rales, or rhonchi. Breathing is unlabored. Heart: RRR with S1 S2.And an  elevated P2 P2 was palpable.   2/6 systolic murmur at the left upper sternal border and a 2/6 holosystolic murmur at the apex , rubs, or gallops appreciated. Abdomen: Soft, non-tender, non-distended with normoactive bowel sounds. No hepatomegaly. No rebound/guarding. No obvious abdominal masses. Msk:  Strength and tone appear normal for age. Extremities: No clubbing or cyanosis.  Tr edema.  Distal pedal pulses are 2+ and equal bilaterally. Skin: Warm and Dry Neuro: Alert and oriented X 3. CN III-XII intact Grossly normal sensory and motor function . Psych:  Responds to questions appropriately with a normal affect.      CBC Lab Results  Component Value Date   WBC 5.5 11/03/2015   HGB 14.1 11/03/2015  HCT 42.2 11/03/2015   MCV 89.0 11/03/2015   PLT 237 11/03/2015   Lipids Lab Results  Component Value Date   CHOL  05/22/2010    173        ATP III CLASSIFICATION:  <200     mg/dL   Desirable  200-239  mg/dL   Borderline High  >=240    mg/dL   High          HDL 42 05/22/2010   LDLCALC * 05/22/2010    116        Total Cholesterol/HDL:CHD Risk Coronary Heart Disease Risk Table                     Men   Women  1/2 Average Risk   3.4   3.3  Average Risk       5.0   4.4  2 X Average Risk   9.6   7.1  3 X Average Risk  23.4   11.0        Use the calculated Patient Ratio above and the CHD Risk Table to determine the patient's CHD Risk.        ATP III CLASSIFICATION (LDL):  <100     mg/dL   Optimal  100-129  mg/dL   Near or Above                    Optimal  130-159  mg/dL   Borderline  160-189  mg/dL   High  >190     mg/dL   Very High   TRIG 73 05/22/2010   BNP PRO B NATRIURETIC PEPTIDE (BNP)  Date/Time Value Ref Range Status  05/04/2015 12:32 PM 282.0* 0.0 - 100.0 pg/mL Final  01/26/2015 12:53 PM 447.0* 0.0 - 100.0 pg/mL Final  11/22/2014 12:13 PM 236.0* 0.0 - 100.0 pg/mL Final  04/27/2014 05:50 AM 1567.0* 0 - 125 pg/mL Final   EKG: Sinus rhythm at 75 Intervals  19/12/45 Axis CLVIII Biatrial enlargement  TTE: 02/08/15 - Left ventricle: The cavity size was severely dilated. There was moderate concentric hypertrophy. Systolic function was severely reduced. The estimated ejection fraction was in the range of 15-20%. Doppler parameters are consistent with restrictive physiology, indicative of decreased left ventricular diastolic compliance and/or increased left atrial pressure. Doppler parameters are consistent with both elevated ventricular end-diastolic filling pressure and elevated left atrial filling pressure. - Mitral valve: There was moderate regurgitation. - Left atrium: The atrium was severely dilated. - Right atrium: The atrium was normal in size. - Atrial septum: No defect or patent foramen ovale was identified. - Tricuspid valve: There was trivial regurgitation. - Pulmonic valve: There was trivial regurgitation. - Pulmonary arteries: Systolic pressure was within the normal range. - Inferior vena cava: The vessel was normal in size. - Pericardium, extracardiac: There was no pericardial effusion.  Impressions: - There is severe LV dilatation with moderate concentric LVH and severe systolic function impairement (LVEF = 15-20%). There is restrictive pattern of diastolic function with elevated filling pressures with significant left to right bowing of the interventricular and interatrial septum.  Left cardiac cath : 03/06/2015 1. Mid to dist LAD lesion, 30% stenosed.   Dilated and globally hypocontractile left ventricle with elevated left ventricular end-diastolic (24 mmHg) and pulmonary capillary wedge (19 mmHg) pressures.  Left ventricular ejection fraction 15-20%.  Widely patent coronary arteries with mid to distal LAD less than 30% narrowing.   Assessment and Plan:   55 year old male  1. Acute on chronic combined chronic systolic and diastolic CHF - NYHA IIB-III - He is mildly fluid overloaded,  I will increase Lasix to 80 mg in the morning and 40 in the afternoon for the next 3 days, then followed by his regular regimen of 40 mg by mouth twice a day. He is advised to take increased dose of Lasix when he feels more short of breath, gains more than 3 pounds overnight 5 in a week, or develops lower extremity edema. - Follow-up in 2 months with BMP prior to that appointment.  2. Non-ischemic CMP - repeat echo showed LVEF 20%, severely dilated LV, continue lisinopril, carvedilol, aspirin, spironolactone, Corlanor and lasix 40 mg po BID   3. NIDDM - continue metformin 500 mg po bid  4. S/P ICD placement - no signs of infection, transmission normal  5. Erectile dysfunction - patient was prescribed Viagra, however unaffordable, he might reconsider in the future.  Follow up in 3 month. Check CMP, BNP today.  Ena Dawley. 02/08/2016

## 2016-02-08 NOTE — Patient Instructions (Signed)
Medication Instructions:   STOP TAKING LOVASTATIN NOW  DR NELSON WANTS YOU TO TAKE LASIX 80 MG IN THE MORNING AND 40 MG IN THE EVENING (AROUND 2 PM) FOR 3 DAYS ONLY, THEN GO BACK TO YOUR ORIGINAL REGIMEN OF LASIX 40 MG TWICE DAILY   Labwork:  2 MONTHS TO CHECK A BMET, SAME DAY AS YOUR FOLLOW-UP APPOINTMENT WITH AN EXTENDER     Follow-Up:  2 MONTHS WITH AN EXTENDER IN OUR OFFICE WITH LAB SAME DAY  .     If you need a refill on your cardiac medications before your next appointment, please call your pharmacy.

## 2016-02-10 ENCOUNTER — Other Ambulatory Visit: Payer: Self-pay | Admitting: Cardiology

## 2016-03-19 NOTE — Progress Notes (Signed)
Electrophysiology Office Note Date: 03/20/2016  ID:  David Bright, DOB [birth year], MRN [id number]  PCP: Luetta Nutting, DO Primary Cardiologist: [name] Electrophysiologist: Caryl Comes  CC: Routine ICD follow-up  David Bright is a 55 y.o. male seen today for Dr Caryl Comes.  He presents today for routine electrophysiology followup.  Since last being seen in our clinic, the patient reports doing reasonably well.  He reports medication compliance and is taking extra lasix as needed based on LE edema.  He is trying to get disability. He states that he has some depression related to inability to work. He is also not sleeping well and hasn't seen Dr Radford Pax recently for sleep eval.  He denies chest pain, palpitations, dyspnea, PND, orthopnea, nausea, vomiting, dizziness, syncope, edema, weight gain, or early satiety.  He has not had ICD shocks.   Device History: MDT single chamber ICD implanted 2016 for NICM History of appropriate therapy: No History of AAD therapy: No   Past Medical History:  Diagnosis Date  . CHF (congestive heart failure) (HCC)    Systolic and diastolic  . COPD (chronic obstructive pulmonary disease) ([place])   . DM (diabetes mellitus) ([place]) 11/09/2011  . HTN (hypertension) 11/09/2011  . Hyperlipidemia   . Hypertension   . Non compliance with medical treatment   . Non-ischemic cardiomyopathy ([place])    a. Normal cath 2002;  b. 01/2015 Echo: EF 15-20%, mod conc LVH, restrictive physiology, mod MR, sev dil LA, triv TR/PI;  c. 02/2015 Cath: LM nl, LAD 30d, LCX nl, OM1 small, nl, RCA nl;  d. 02/2015 s/p MDT single lead AICD (ser# [id number]).  . OSA (obstructive sleep apnea) 06/04/2015   Mild with AHI 12.3/hr and AHI 24.5/hr in REM sleep.  . OSA on CPAP 11/09/2011  . Polysubstance abuse    History of quitting alcohol, marijuana and crack over 12 years ago  . Sleep apnea    Nocturnal CPAP  . Type II diabetes mellitus ([place])    Past Surgical History:  Procedure Laterality Date  .  CARDIAC CATHETERIZATION N/A 03/06/2015   Procedure: Right/Left Heart Cath and Coronary Angiography;  Surgeon: Belva Crome, MD;  Location: [facility];  Service: Cardiovascular;  Laterality: N/A;  . EP IMPLANTABLE DEVICE N/A 03/06/2015   Procedure: ICD Implant;  Surgeon: Deboraha Sprang, MD;  Location: [facility];  Service: Cardiovascular;  Laterality: N/A;  . none      Current Outpatient Prescriptions  Medication Sig Dispense Refill  . ACCU-CHEK SOFTCLIX LANCETS lancets as directed.    Marland Kitchen aspirin EC 81 MG tablet Take 1 tablet (81 mg total) by mouth daily. 30 tablet 0  . canagliflozin (INVOKANA) 300 MG TABS tablet Take 300 mg by mouth daily before breakfast.    . carvedilol (COREG) 12.5 MG tablet TAKE ONE TABLET BY MOUTH TWICE DAILY WITH A MEAL 180 tablet 2  . furosemide (LASIX) 20 MG tablet TAKE TWO TABLETS BY MOUTH TWICE DAILY 120 tablet 11  . glucose blood (ACCU-CHEK AVIVA PLUS) test strip as directed.    . ivabradine (CORLANOR) 5 MG TABS tablet Take 1 tablet (5 mg total) by mouth 2 (two) times daily with a meal. 180 tablet 3  . lisinopril (PRINIVIL,ZESTRIL) 5 MG tablet Take 1 tablet (5 mg total) by mouth daily. 90 tablet 3  . potassium chloride SA (K-DUR,KLOR-CON) 20 MEQ tablet Take 1 tablet (20 mEq total) by mouth daily. 90 tablet 3  . sildenafil (REVATIO) 20 MG tablet Take 2-4  tablets by mouth as needed.    . sitaGLIPtin-metformin (JANUMET) 50-1000 MG tablet Take 1 tablet by mouth 2 (two) times daily.    Marland Kitchen spironolactone (ALDACTONE) 25 MG tablet TAKE ONE TABLET BY MOUTH ONCE DAILY 90 tablet 0   No current facility-administered medications for this visit.     Allergies:   Review of patient's allergies indicates no known allergies.   Social History: Social History   Social History  . Marital status: Single    Spouse name: N/A  . Number of children: N/A  . Years of education: N/A   Occupational History  . 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 Smoking status: Current Every Day Smoker    Packs/day: 1.00    Years: 33.00    Types: Cigarettes  . Smokeless tobacco: Never Used  . Alcohol use No     Comment: QUIT IN 2000  . Drug use: No     Comment: quit 12 years ago  . Sexual activity: No   Other Topics Concern  . Not on file   Social History Narrative   Lives in [place], [place] with housemate.     Family History: Family History  Problem Relation Age of Onset  . Cancer Mother     Review of Systems: All other systems reviewed and are otherwise negative except as noted above.   Physical Exam: VS:  BP 100/64 (BP Location: Left Arm, Patient Position: Sitting, Cuff Size: Normal)   Pulse 78   Ht 5\' 10"  (1.778 m)   Wt 250 lb (113.4 kg)   BMI 35.87 kg/m  , BMI Body mass index is 35.87 kg/m.  GEN- The patient is obese and chronically ill appearing, alert and oriented x 3 today.   HEENT: normocephalic, atraumatic; sclera clear, conjunctiva pink; hearing intact; oropharynx clear; neck supple  Lungs- Clear to ausculation bilaterally, normal work of breathing.  No wheezes, rales, rhonchi Heart- Regular rate and rhythm, no murmurs, rubs or gallops  GI- soft, non-tender, non-distended, bowel sounds present  Extremities- no clubbing, cyanosis, or edema; DP/PT/radial pulses 2+ bilaterally MS- no significant deformity or atrophy Skin- warm and dry, no rash or lesion; ICD pocket well healed Psych- euthymic mood, full affect Neuro- strength and sensation are intact  ICD interrogation- reviewed in detail today,  See PACEART report  EKG:  EKG is not ordered today.  Recent Labs: 05/04/2015: Pro B Natriuretic peptide (BNP) 282.0 11/03/2015: ALT 8; Brain Natriuretic Peptide 40.9; BUN 17; Creat 1.03; Hemoglobin 14.1; Platelets 237; Potassium 4.0; Sodium 140; TSH 0.72   Wt Readings from Last 3 Encounters:  03/20/16 250 lb (113.4 kg)  02/08/16 251 lb (113.9 kg)  11/03/15 257 lb (116.6 kg)     Other studies  Reviewed: Additional studies/ records that were reviewed today include: Dr [name] and Dr Olin Pia office notes  Assessment and Plan:  1.  Chronic systolic dysfunction euvolemic today Stable on an appropriate medical regimen Normal ICD function See [facility] report No changes today BMET today Will enroll in [facility] with Sharman Cheek  2.  OSA on CPAP With increased daytime somnolence, will refer back to Dr Radford Pax to see if any adjustments need to be made with CPAP  3.  Depression He is seeing a counselor on Thursday I have encouraged him to keep that appointment   Current medicines are reviewed at length with the patient today.   The patient does not have concerns regarding his  medicines.  The following changes were made today:  none  Labs/ tests ordered today include: BMET Orders Placed This Encounter  Procedures  . Basic Metabolic Panel (BMET)     Disposition:   Follow up with Dr [name] as scheduled, Karen Chafe, ICM clinic, Dr Caryl Comes 1 year      Signed, Chanetta Marshall, NP 03/20/2016 12:05 PM  [facility] [address] [place] [address] [phone] (office) [phone] (fax)

## 2016-03-20 ENCOUNTER — Encounter: Payer: Self-pay | Admitting: Internal Medicine

## 2016-03-20 ENCOUNTER — Encounter: Payer: Self-pay | Admitting: Nurse Practitioner

## 2016-03-20 ENCOUNTER — Encounter (INDEPENDENT_AMBULATORY_CARE_PROVIDER_SITE_OTHER): Payer: Self-pay

## 2016-03-20 ENCOUNTER — Ambulatory Visit (INDEPENDENT_AMBULATORY_CARE_PROVIDER_SITE_OTHER): Payer: Medicaid Other | Admitting: Nurse Practitioner

## 2016-03-20 VITALS — BP 100/64 | HR 78 | Ht 70.0 in | Wt 250.0 lb

## 2016-03-20 DIAGNOSIS — I5022 Chronic systolic (congestive) heart failure: Secondary | ICD-10-CM

## 2016-03-20 NOTE — Patient Instructions (Addendum)
Medication Instructions:   Your physician recommends that you continue on your current medications as directed. Please refer to the Current Medication list given to you today.   If you need a refill on your cardiac medications before your next appointment, please call your pharmacy.  Labwork: BEMT TODAY    Testing/Procedures:  NONE ORDER TODAY '   Follow-Up: NEXT AVAILABLE WITH DR TURNER FOR REEVALUATE FOR SLEEP STUDY  Your physician wants you to follow-up in: [place] will receive a reminder letter in the mail two months in advance. If you don't receive a letter, please call our office to schedule the follow-up appointment.     Any Other Special Instructions Will Be Listed Below (If Applicable).

## 2016-03-21 LAB — CUP PACEART INCLINIC DEVICE CHECK
Date Time Interrogation Session: 20170809122305
Implantable Lead Implant Date: 20160725
MDC IDC LEAD LOCATION: 753860
MDC IDC LEAD MODEL: 293
MDC IDC LEAD SERIAL: 383593

## 2016-03-21 LAB — BASIC METABOLIC PANEL
BUN: 36 mg/dL — AB (ref 7–25)
CALCIUM: 9.6 mg/dL (ref 8.6–10.3)
CO2: 21 mmol/L (ref 20–31)
CREATININE: 1.17 mg/dL (ref 0.70–1.33)
Chloride: 101 mmol/L (ref 98–110)
Glucose, Bld: 111 mg/dL — ABNORMAL HIGH (ref 65–99)
Potassium: 4.7 mmol/L (ref 3.5–5.3)
Sodium: 136 mmol/L (ref 135–146)

## 2016-03-21 NOTE — Progress Notes (Signed)
Referred to ICM clinic by Chanetta Marshall, NP.  Met patient in office and he agreed to monthly ICM calls.  He gave permission to leave detailed message on home and cell phone number.  Discussed following low sodium diet and ways to decrease sodium intake.  1st ICM remote transmission scheduled for 04/23/2016.  Provided ICM number and encouraged to call for fluid symptoms.

## 2016-03-22 ENCOUNTER — Telehealth: Payer: Self-pay | Admitting: *Deleted

## 2016-03-22 NOTE — Telephone Encounter (Signed)
SPOKE TO PT ABOUT RESULTS 

## 2016-03-22 NOTE — Telephone Encounter (Signed)
-----   Message from Patsey Berthold, NP sent at 03/21/2016  7:15 AM EDT ----- Please notify patient of stable labs

## 2016-03-23 ENCOUNTER — Other Ambulatory Visit: Payer: Self-pay | Admitting: Nurse Practitioner

## 2016-04-01 ENCOUNTER — Encounter: Payer: Self-pay | Admitting: Cardiology

## 2016-04-06 ENCOUNTER — Other Ambulatory Visit: Payer: Self-pay | Admitting: Cardiology

## 2016-04-08 ENCOUNTER — Encounter: Payer: Self-pay | Admitting: Physician Assistant

## 2016-04-08 ENCOUNTER — Telehealth: Payer: Self-pay

## 2016-04-08 NOTE — Telephone Encounter (Signed)
Called patient per David Copa PA's request. Patient recently seen by [name]. Patient was stable at that appointment. Called patient to see how he is going. Patient stated he does not feel like he needs to be seen at this time and will call the office with any concerns or questions. Made an appointment for patient to follow-up with Dr. [name] 06/12/16. Patient verbalized understanding and agreed to plan.

## 2016-04-08 NOTE — Telephone Encounter (Signed)
The patient has since followed up in the timeframe recommended and already had his f/u BMET - and that he was offered opportunity to come in if he had further issues to discuss, but declined due to feeling stable.

## 2016-04-09 ENCOUNTER — Other Ambulatory Visit: Payer: Medicaid Other

## 2016-04-09 ENCOUNTER — Ambulatory Visit: Payer: Medicaid Other | Admitting: Physician Assistant

## 2016-04-23 ENCOUNTER — Ambulatory Visit (INDEPENDENT_AMBULATORY_CARE_PROVIDER_SITE_OTHER): Payer: Medicaid Other

## 2016-04-23 ENCOUNTER — Telehealth: Payer: Self-pay | Admitting: Cardiology

## 2016-04-23 DIAGNOSIS — I5022 Chronic systolic (congestive) heart failure: Secondary | ICD-10-CM

## 2016-04-23 DIAGNOSIS — Z95810 Presence of automatic (implantable) cardiac defibrillator: Secondary | ICD-10-CM | POA: Diagnosis not present

## 2016-04-23 NOTE — Progress Notes (Signed)
EPIC Encounter for ICM Monitoring  Patient Name: David Bright is a 55 y.o. male Date: 04/23/2016 Primary Care Physican: Luetta Nutting, DO Primary Cardiologist: [name] Electrophysiologist: Caryl Comes Dry Weight: Does not weigh      1st ICM encounter  Heart Failure questions reviewed, pt symptomatic with swelling in feet/legs and slight increase in SOB  Thoracic impedance abnormal suggesting fluid accumulation since 04/03/2016 to 04/23/2016 with exception of 3 days at baseline.  Recommendations: Increase Furosemide 20 mg to 3 tablets bid x 3 days and increase Potassium 20 mEq to 1 tablet bid x 3 days.  Advised to return to prescribed dosages after 3rd day.  He verbalized understanding.  He stated he has been taking extra Furosemide a couple of times a week for the last 2 weeks.   Follow-up plan: ICM clinic phone appointment on 04/29/2016.  Copy of ICM check sent to primary cardiologist and device physician.   ICM trend: 04/23/2016       [name], RN 04/23/2016 2:32 PM

## 2016-04-23 NOTE — Telephone Encounter (Signed)
LMOVM reminding pt to send remote transmission.   

## 2016-04-29 ENCOUNTER — Ambulatory Visit (INDEPENDENT_AMBULATORY_CARE_PROVIDER_SITE_OTHER): Payer: Medicaid Other

## 2016-04-29 DIAGNOSIS — I5022 Chronic systolic (congestive) heart failure: Secondary | ICD-10-CM | POA: Diagnosis not present

## 2016-04-29 DIAGNOSIS — Z95810 Presence of automatic (implantable) cardiac defibrillator: Secondary | ICD-10-CM

## 2016-04-29 NOTE — Progress Notes (Signed)
EPIC Encounter for ICM Monitoring  Patient Name: David Bright is a 55 y.o. male Date: 04/29/2016 Primary Care Physican: Luetta Nutting, DO Primary Cardiologist: [name] Electrophysiologist: Caryl Comes Dry Weight: Does not weigh       Heart Failure questions reviewed, pt symptomatic with intermittent leg swelling and increased shortness of breath even after increase in Furosemide 20 mg to 3 tablets bid on 9/12 x 3 days.  Thoracic impedance abnormal suggesting fluid accumulation even after increasing Furosemide to 60 mg bid x 3 days on 04/23/2046.  Impedance returned close to baseline on the 3rd day he took extra Furosemide but then trended back to below baseline suggesting fluid accumulation again.    MEDS:  Patient's prescribed dosage of Furosemide 20 mg 2 tablets (40 mg total) bid.    Patient reported he have been increasing the Furosemide 20 mg on to 3 tablets (60mg  total) in am and sometimes in the afternoon as well due to symptoms.  Asked in the last month, how many times has he increased his Furosemide dosage and he stated probably 3-4 times a week.     DIET:  Discussed reading food labels to determine if he is eating more than 2000 mg daily.  He did state he eats deli meat sandwiches daily and advised deli meat is high in salt.   Recommendations:   Copy of ICM check sent to Dr [name] and Dr Caryl Comes for any recommendations regarding thoracic impedance continues to be suggesting fluid accumulation, he has intermittent leg swelling and increased in shortness of breath despite taking extra Furosemide for most days in the last month.     Follow-up plan: ICM clinic phone appointment on 05/16/2016.    ICM trend: 04/29/2016       [name], RN 04/29/2016 11:38 AM

## 2016-04-30 NOTE — Progress Notes (Signed)
Call to patient and advised Dr [name] will be in the office tomorrow.  Will check for any recommendations and call him back tomorrow.  He denied being in any distress and breathing is a little better today.  Advised to use ER if he has symptoms that need urgent attention.

## 2016-05-01 ENCOUNTER — Institutional Professional Consult (permissible substitution): Payer: Medicaid Other | Admitting: Cardiology

## 2016-05-01 NOTE — Progress Notes (Signed)
Reviewed with Dr [name] in office.  Recommendation to have patient scheduled for office visit either with her or PA/NP within next 10 days.  BMET and BNP to be drawn as soon as possible. Discussed open appointments with scheduler Mack Guise regarding available appointments and ordering labs.   Call to patient and advised of Dr Francesca Oman recommendations and he has appointment with PA/NP on 05/13/2016 at 1:30 pm.  Patient agreed to appointment on 05/13/2016 at 1:30PM.  He can get labs drawn today or tomorrow but is difficult for him to get to [facility] [place] office.   His PCP is Dr Luetta Nutting of [facility] and asked if they could be drawn in [facility] instead.  Advised will check to see if labs can be drawn at PCP office.  Will contact PCP office.

## 2016-05-01 NOTE — Progress Notes (Signed)
Call back to patient and advised was not able to get an answer from PCP if he can have the blood drawn tomorrow.  Provided option to go to a [facility] lab in [place] or come to [address] office.  He stated he will come to [address] office tomorrow.   Rescheduled ICM remote transmission for 05/13/2016 which is same day as office visit with Sharrell Ku, PA.Marland Kitchen

## 2016-05-02 ENCOUNTER — Other Ambulatory Visit: Payer: Medicaid Other | Admitting: *Deleted

## 2016-05-02 DIAGNOSIS — I5022 Chronic systolic (congestive) heart failure: Secondary | ICD-10-CM

## 2016-05-02 LAB — BASIC METABOLIC PANEL
BUN: 34 mg/dL — ABNORMAL HIGH (ref 7–25)
CO2: 24 mmol/L (ref 20–31)
Calcium: 8.8 mg/dL (ref 8.6–10.3)
Chloride: 101 mmol/L (ref 98–110)
Creat: 1.33 mg/dL (ref 0.70–1.33)
Glucose, Bld: 157 mg/dL — ABNORMAL HIGH (ref 65–99)
Potassium: 4.1 mmol/L (ref 3.5–5.3)
Sodium: 136 mmol/L (ref 135–146)

## 2016-05-02 LAB — BRAIN NATRIURETIC PEPTIDE: Brain Natriuretic Peptide: 21.2 pg/mL (ref <100)

## 2016-05-06 ENCOUNTER — Telehealth: Payer: Self-pay | Admitting: *Deleted

## 2016-05-06 MED ORDER — POTASSIUM CHLORIDE ER 10 MEQ PO TBCR: 10.0000 meq | EXTENDED_RELEASE_TABLET | Freq: Every day | ORAL | 3 refills | Status: DC

## 2016-05-06 MED ORDER — FUROSEMIDE 20 MG PO TABS: 20.0000 mg | ORAL_TABLET | Freq: Every day | ORAL | 3 refills | Status: DC

## 2016-05-06 NOTE — Telephone Encounter (Signed)
Spoke with the pt to inform him that per Dr [name], his labs showed he has normal electrolytes, normal BNP meaning that he has no signs of heart failure in his lower extremity edema is not because of his heart. Informed the pt that per Dr [name], his creatinine is elevated, and therefore he should cut down on Lasix to 40 daily and on days when his legs are more swollen 40 twice a day.  Per the pt, he states he is already taking 40 mg of lasix daily.  Clarified this with Dr [name], and she now wants him to decrease his lasix to 20 mg po daily, and he may take an extra 20 mg tablet of lasix, on the days he's more swollen than usual.  Pt did want to clarify with Dr [name] if she still wanted him to remain on K-dur 20 mEq po daily. Informed the pt that I will clarify this with her, and follow-up with him shortly thereafter. Pt verbalized understanding and agrees with this plan.  Contacted the pt to inform him that per Dr [name], we will decrease his KCL to 10 mEq po daily.  Confirmed the pharmacy of choice with the pt.  Pt verbalized understanding and agrees with this plan.

## 2016-05-06 NOTE — Telephone Encounter (Signed)
-----   Message from Dorothy Spark, MD sent at 05/04/2016  7:39 AM EDT ----- He has normal electrolytes, normal BNP meaning that he has no signs of heart failure in his lower extremity edema is not because of his heart. His creatinine is elevated, and therefore he should cut down on Lasix to 40 daily and on days when his legs are more swollen 40 twice a day.

## 2016-05-10 ENCOUNTER — Encounter: Payer: Self-pay | Admitting: Physician Assistant

## 2016-05-13 ENCOUNTER — Ambulatory Visit (INDEPENDENT_AMBULATORY_CARE_PROVIDER_SITE_OTHER): Payer: Medicaid Other

## 2016-05-13 ENCOUNTER — Ambulatory Visit: Payer: Medicaid Other | Admitting: Physician Assistant

## 2016-05-13 DIAGNOSIS — Z95810 Presence of automatic (implantable) cardiac defibrillator: Secondary | ICD-10-CM

## 2016-05-13 DIAGNOSIS — I5022 Chronic systolic (congestive) heart failure: Secondary | ICD-10-CM

## 2016-05-13 DIAGNOSIS — N179 Acute kidney failure, unspecified: Secondary | ICD-10-CM | POA: Insufficient documentation

## 2016-05-13 NOTE — Progress Notes (Deleted)
Cardiology Office Note    Date:  05/13/2016  ID:  [name], DOB 1960-08-19, MRN [id number] PCP:  Luetta Nutting, DO  Cardiologist:  [name]; EP: Caryl Comes   Chief Complaint: chest pain  History of Present Illness:  FHER WYNDHAM is a 55 y.o. male with history of chronic combined CHF/NICM (last cath 02/2015 with LM nl, LAD 30d, LCX nl, OM1 small, nl, RCA nl), s/p Medtronic ICD, COPD, HTN, hyperlipidemia, DM, hyperlipidemia, OSA, remote polysubstance abuse (h/o quitting alcohol, THC, crack), obesity, depression who presents for f/u of LEE. Per chart he has history of being lost to f/u due to loss of medical insurance and prior issue with running out of medications. Last echo 08/2015: severely dilated, EF 20%, diffuse HK, elev LV filling pressures, mild-mod MR, mod LAE. He was seen by Dr. [name] in 01/2016 for worsening LEE. His Lasix was increased to 80mg  QAM/40mg  QPM x 3 days then 40mg  BID. At EP f/u with Chanetta Marshall on 03/20/16 he was doing well. F/u BMET stable at that time with slightly elevated BUN. Most recent f/u BMET which was automatically in the system 05/02/16 showed Na 136, K 4.1, BUN 34, Cr 1.33 (prev basleine Cr 0.9-1.0). His BNP was normal so he was advised to cut Lasix down to 40mg  daily.  f/u tt - pt cancelled 9/20     Past Medical History:  Diagnosis Date  . Chronic combined systolic and diastolic CHF (congestive heart failure) (HCC)    Systolic and diastolic  . COPD (chronic obstructive pulmonary disease) ([place])   . Hyperlipidemia   . Hypertension   . Non compliance with medical treatment   . Non-ischemic cardiomyopathy ([place])    a. Normal cath 2002;  b. 01/2015 Echo: EF 15-20%, mod conc LVH, restrictive physiology, mod MR, sev dil LA, triv TR/PI;  c. 02/2015 Cath: LM nl, LAD 30d, LCX nl, OM1 small, nl, RCA nl;  d. 02/2015 s/p MDT single lead AICD (ser# [id number]).  . OSA (obstructive sleep apnea) 06/04/2015   Mild with AHI 12.3/hr and AHI 24.5/hr in REM sleep.  .  Polysubstance abuse    History of quitting alcohol, marijuana and crack over 12 years ago  . Type II diabetes mellitus ([place])     Past Surgical History:  Procedure Laterality Date  . CARDIAC CATHETERIZATION N/A 03/06/2015   Procedure: Right/Left Heart Cath and Coronary Angiography;  Surgeon: Belva Crome, MD;  Location: [facility];  Service: Cardiovascular;  Laterality: N/A;  . EP IMPLANTABLE DEVICE N/A 03/06/2015   Procedure: ICD Implant;  Surgeon: Deboraha Sprang, MD;  Location: [facility];  Service: Cardiovascular;  Laterality: N/A;  . none      Current Medications: Current Outpatient Prescriptions  Medication Sig Dispense Refill  . ACCU-CHEK SOFTCLIX LANCETS lancets as directed.    Marland Kitchen aspirin EC 81 MG tablet Take 1 tablet (81 mg total) by mouth daily. 30 tablet 0  . canagliflozin (INVOKANA) 300 MG TABS tablet Take 300 mg by mouth daily before breakfast.    . carvedilol (COREG) 12.5 MG tablet TAKE ONE TABLET BY MOUTH TWICE DAILY WITH A MEAL 180 tablet 2  . furosemide (LASIX) 20 MG tablet Take 1 tablet (20 mg total) by mouth daily. You may take an extra 20 mg tablet by mouth on days where you are more swollen. 90 tablet 3  . glucose blood (ACCU-CHEK AVIVA PLUS) test strip as directed.    . ivabradine (CORLANOR) 5 MG TABS tablet  Take 1 tablet (5 mg total) by mouth 2 (two) times daily with a meal. 180 tablet 3  . lisinopril (PRINIVIL,ZESTRIL) 5 MG tablet Take 1 tablet (5 mg total) by mouth daily. 90 tablet 3  . potassium chloride (K-DUR) 10 MEQ tablet Take 1 tablet (10 mEq total) by mouth daily. 90 tablet 3  . sildenafil (REVATIO) 20 MG tablet Take 2-4 tablets by mouth as needed.    . sitaGLIPtin-metformin (JANUMET) 50-1000 MG tablet Take 1 tablet by mouth 2 (two) times daily.    Marland Kitchen spironolactone (ALDACTONE) 25 MG tablet TAKE ONE TABLET BY MOUTH ONCE DAILY 90 tablet 2   No current facility-administered medications for this visit.      Allergies:   Review of patient's  allergies indicates no known allergies.   Social History   Social History  . Marital status: Single    Spouse name: N/A  . Number of children: N/A  . Years of education: N/A   Occupational History  . 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 Smoking status: Current Every Day Smoker    Packs/day: 1.00    Years: 33.00    Types: Cigarettes  . Smokeless tobacco: Never Used  . Alcohol use No     Comment: QUIT IN 2000  . Drug use: No     Comment: quit 12 years ago  . Sexual activity: No   Other Topics Concern  . Not on file   Social History Narrative   Lives in [place], [place] with housemate.      Family History:  The patient's family history includes Cancer in his mother. ***  ROS:   Please see the history of present illness. Otherwise, review of systems is positive for ***.  All other systems are reviewed and otherwise negative.    PHYSICAL EXAM:   VS:  There were no vitals taken for this visit.  BMI: There is no height or weight on file to calculate BMI. GEN: Well nourished, well developed, in no acute distress  HEENT: normocephalic, atraumatic Neck: no JVD, carotid bruits, or masses Cardiac: ***RRR; no murmurs, rubs, or gallops, no edema  Respiratory:  clear to auscultation bilaterally, normal work of breathing GI: soft, nontender, nondistended, + BS MS: no deformity or atrophy  Skin: warm and dry, no rash Neuro:  Alert and Oriented x 3, Strength and sensation are intact, follows commands Psych: euthymic mood, full affect  Wt Readings from Last 3 Encounters:  03/20/16 250 lb (113.4 kg)  02/08/16 251 lb (113.9 kg)  11/03/15 257 lb (116.6 kg)      Studies/Labs Reviewed:   EKG:  EKG was ordered today and personally reviewed by me and demonstrates *** EKG was not ordered today.***  Recent Labs: 11/03/2015: ALT 8; Hemoglobin 14.1; Platelets 237; TSH 0.72 05/02/2016: Brain Natriuretic Peptide 21.2; BUN 34; Creat 1.33; Potassium  4.1; Sodium 136   Lipid Panel    Component Value Date/Time   CHOL  05/22/2010 0318    173        ATP III CLASSIFICATION:  <200     mg/dL   Desirable  200-239  mg/dL   Borderline High  >=240    mg/dL   High          TRIG 73 05/22/2010 0318   HDL 42 05/22/2010 0318   CHOLHDL 4.1 05/22/2010 0318   VLDL 15 05/22/2010 0318   LDLCALC (H) 05/22/2010 0318    116  Total Cholesterol/HDL:CHD Risk Coronary Heart Disease Risk Table                     Men   Women  1/2 Average Risk   3.4   3.3  Average Risk       5.0   4.4  2 X Average Risk   9.6   7.1  3 X Average Risk  23.4   11.0        Use the calculated Patient Ratio above and the CHD Risk Table to determine the patient's CHD Risk.        ATP III CLASSIFICATION (LDL):  <100     mg/dL   Optimal  100-129  mg/dL   Near or Above                    Optimal  130-159  mg/dL   Borderline  160-189  mg/dL   High  >190     mg/dL   Very High    Additional studies/ records that were reviewed today include: Summarized above.***    ASSESSMENT & PLAN:   1. Chronic combined CHF/NICM 2. Acute kidney injury 3. Essential HTN 4. Obstructive sleep apnea  Disposition: F/u with ***   Medication Adjustments/Labs and Tests Ordered: Current medicines are reviewed at length with the patient today.  Concerns regarding medicines are outlined above. Medication changes, Labs and Tests ordered today are summarized above and listed in the Patient Instructions accessible in Encounters.   Raechel Ache PA-C  05/13/2016 7:42 AM    [address], [place], [place]  [address] Phone: [phone]; Fax: [phone]

## 2016-05-14 NOTE — Progress Notes (Signed)
EPIC Encounter for ICM Monitoring  Patient Name: GEORGIY SCALISI is a 55 y.o. male Date: 05/14/2016 Primary Care Physican: Luetta Nutting, DO Primary Cardiologist: [name] Electrophysiologist: Caryl Comes Dry Weight: unknown       Heart Failure questions reviewed, pt symptomatic with swelling in stomach area because his pants are tighter at waistband.  He continues to have some leg swelling but not any worse than normal.    Thoracic impedance abnormal suggesting fluid accumulation 04/28/2016.  05/02/2016 lab results were normal BNP and Creatinine had increased.  Patient was advised on 05/06/2016 to reduce Furosemide 20 mg to 1 tablet daily and Potassium to 10 mEq 1 tablet daily.    Patient did not reduce the dosage on 05/06/2016 as directed by Dr Francesca Oman nurse.  He has been taking Furosemide 20 mg 1 tablet bid and Potassium 10 mEq 1 tablet bid and he continues to have fluid symptoms and transmission .      LABS: 05/02/2016 Creatinine 1.33, BUN 34, Potassium 4.1, Sodium 136 03/20/2016 Creatinine 1.17, BUN 36, Potassium 4.7, Sodium 136 11/03/2015 Creatinine 1.03, BUN 17, Potassium 4.0, Sodium 140  Recommendations:      Follow-up plan: ICM clinic phone appointment on 05/29/2016.  Appt with Dr [name] 06/12/2016.    Copy of ICM check sent to primary cardiologist and device physician.   ICM trend: 05/13/2016       [name], RN 05/14/2016 8:26 AM

## 2016-05-15 NOTE — Progress Notes (Signed)
Reviewed with Dr [name] in office.  Call to patient with recommendations.  Dr [name] recommended Furosemide dosage should be 20 mg 1 tablet daily and Potassium 10 mEq 1 tablet as instructed on 05/06/2016 and may take additional 20 mg tablet only for days he may be more swollen along with 1 extra 10 mEq of Potassium.  Advised he should not take extra medication no more than 2 days in a row and if the symptoms do not resolve, then to call ICM number for additional instructions.  Dr [name] advised a change in diet and follow a low salt diet of 2000 mg daily and limit fluid intake to 2 liters a day.  Explained Furosemide can not alleviate all the fluid if he is overloading with salt intake and an increase in Furosemide may further decrease renal function.  He verbalized understanding and repeated directions for taking Furosemide and Potassium.   Next ICM follow up call will be 05/29/2016 and he as office appointment with Dr [name] on 06/12/2016.

## 2016-05-16 ENCOUNTER — Institutional Professional Consult (permissible substitution): Payer: Medicaid Other | Admitting: Cardiology

## 2016-05-20 ENCOUNTER — Other Ambulatory Visit: Payer: Self-pay | Admitting: Cardiology

## 2016-05-20 DIAGNOSIS — I5042 Chronic combined systolic (congestive) and diastolic (congestive) heart failure: Secondary | ICD-10-CM

## 2016-05-20 DIAGNOSIS — I5022 Chronic systolic (congestive) heart failure: Secondary | ICD-10-CM

## 2016-05-22 ENCOUNTER — Encounter: Payer: Self-pay | Admitting: Physician Assistant

## 2016-05-29 ENCOUNTER — Ambulatory Visit (INDEPENDENT_AMBULATORY_CARE_PROVIDER_SITE_OTHER): Payer: Medicaid Other

## 2016-05-29 DIAGNOSIS — Z95810 Presence of automatic (implantable) cardiac defibrillator: Secondary | ICD-10-CM

## 2016-05-29 DIAGNOSIS — I5022 Chronic systolic (congestive) heart failure: Secondary | ICD-10-CM

## 2016-05-29 NOTE — Progress Notes (Signed)
EPIC Encounter for ICM Monitoring  Patient Name: David Bright is a 55 y.o. male Date: 05/29/2016 Primary Care Physican: Luetta Nutting, DO Primary Cardiologist: [name] Electrophysiologist: Caryl Comes Dry Weight:  254 lbs      Heart Failure questions reviewed, pt asymptomatic and stomach/leg swelling have resolved  Thoracic impedance returned to normal   LABS: 05/02/2016 Creatinine 1.33, BUN 34, Potassium 4.1, Sodium 136 03/20/2016 Creatinine 1.17, BUN 36, Potassium 4.7, Sodium 136 11/03/2015 Creatinine 1.03, BUN 17, Potassium 4.0, Sodium 140  Recommendations: No changes.  Advised to limit salt intake to 2000 mg daily.  Encouraged to call for fluid symptoms.    Follow-up plan: ICM clinic phone appointment on 06/12/2016.  Copy of ICM check sent to primary cardiologist and device physician.   ICM trend: 05/29/2016       [name], RN 05/29/2016 1:29 PM

## 2016-05-30 ENCOUNTER — Encounter: Payer: Self-pay | Admitting: Cardiology

## 2016-06-12 ENCOUNTER — Ambulatory Visit (INDEPENDENT_AMBULATORY_CARE_PROVIDER_SITE_OTHER): Payer: Medicaid Other | Admitting: Cardiology

## 2016-06-12 ENCOUNTER — Ambulatory Visit (INDEPENDENT_AMBULATORY_CARE_PROVIDER_SITE_OTHER): Payer: Medicaid Other

## 2016-06-12 ENCOUNTER — Encounter: Payer: Self-pay | Admitting: Cardiology

## 2016-06-12 VITALS — BP 124/70 | HR 74 | Ht 70.0 in | Wt 266.0 lb

## 2016-06-12 DIAGNOSIS — I5022 Chronic systolic (congestive) heart failure: Secondary | ICD-10-CM | POA: Diagnosis not present

## 2016-06-12 DIAGNOSIS — R0602 Shortness of breath: Secondary | ICD-10-CM

## 2016-06-12 DIAGNOSIS — I5043 Acute on chronic combined systolic (congestive) and diastolic (congestive) heart failure: Secondary | ICD-10-CM | POA: Diagnosis not present

## 2016-06-12 DIAGNOSIS — Z95810 Presence of automatic (implantable) cardiac defibrillator: Secondary | ICD-10-CM

## 2016-06-12 DIAGNOSIS — I10 Essential (primary) hypertension: Secondary | ICD-10-CM

## 2016-06-12 DIAGNOSIS — G4733 Obstructive sleep apnea (adult) (pediatric): Secondary | ICD-10-CM

## 2016-06-12 DIAGNOSIS — I5042 Chronic combined systolic (congestive) and diastolic (congestive) heart failure: Secondary | ICD-10-CM | POA: Diagnosis not present

## 2016-06-12 DIAGNOSIS — I428 Other cardiomyopathies: Secondary | ICD-10-CM

## 2016-06-12 LAB — BASIC METABOLIC PANEL
BUN: 22 mg/dL (ref 7–25)
CO2: 25 mmol/L (ref 20–31)
Calcium: 9.1 mg/dL (ref 8.6–10.3)
Chloride: 106 mmol/L (ref 98–110)
Creat: 1.12 mg/dL (ref 0.70–1.33)
Glucose, Bld: 151 mg/dL — ABNORMAL HIGH (ref 65–99)
Potassium: 4 mmol/L (ref 3.5–5.3)
Sodium: 140 mmol/L (ref 135–146)

## 2016-06-12 LAB — BRAIN NATRIURETIC PEPTIDE: Brain Natriuretic Peptide: 31.1 pg/mL (ref <100)

## 2016-06-12 MED ORDER — FUROSEMIDE 40 MG PO TABS: 40.0000 mg | ORAL_TABLET | Freq: Two times a day (BID) | ORAL | 3 refills | Status: DC

## 2016-06-12 MED ORDER — POTASSIUM CHLORIDE CRYS ER 20 MEQ PO TBCR: 20.0000 meq | EXTENDED_RELEASE_TABLET | Freq: Every day | ORAL | 3 refills | Status: DC

## 2016-06-12 NOTE — Progress Notes (Signed)
Cardiology Office Note    Date:  06/12/2016  ID:  David Bright, DOB 01/02/1961, MRN [id number] PCP:  Luetta Nutting, DO  Cardiologist:  [name]; EP: Caryl Comes   Chief Complaint: SOB  History of Present Illness:  David Bright is a 55 y.o. male with history of chronic combined CHF/NICM (last cath 02/2015 with LM nl, LAD 30d, LCX nl, OM1 small, nl, RCA nl), s/p Medtronic ICD, COPD, HTN, hyperlipidemia, DM, hyperlipidemia, OSA, remote polysubstance abuse (h/o quitting alcohol, THC, crack), obesity, depression who presents for f/u of LEE. Per chart he has history of being lost to f/u due to loss of medical insurance and prior issue with running out of medications. Last echo 08/2015: severely dilated, EF 20%, diffuse HK, elev LV filling pressures, mild-mod MR, mod LAE. He was seen by Dr. [name] in 01/2016 for worsening LEE. His Lasix was increased to 80mg  QAM/40mg  QPM x 3 days then 40mg  BID. At EP f/u with Chanetta Marshall on 03/20/16 he was doing well. F/u BMET stable at that time with slightly elevated BUN. Most recent f/u BMET which was automatically in the system 05/02/16 showed Na 136, K 4.1, BUN 34, Cr 1.33 (prev basleine Cr 0.9-1.0). His BNP was normal so he was advised to cut Lasix down to 40mg  daily.  f/u tt - pt cancelled 9/20, no show on 10/2  06/12/2016 - 4 months follow up, the patient has noticed worsening SOB and some LE edema, he also has PND despite wearing CPAP mask. No palpitations, no syncope no chest pain. He states that he has been complaint with his meds.   Past Medical History:  Diagnosis Date  . Chronic combined systolic and diastolic CHF (congestive heart failure) (H[facility])    Systolic and diastolic  . COPD (chronic obstructive pulmonary disease) ([place])   . Hyperlipidemia   . Hypertension   . Non compliance with medical treatment   . Non-ischemic cardiomyopathy ([place])    a. Normal cath 2002;  b. 01/2015 Echo: EF 15-20%, mod conc LVH, restrictive physiology, mod MR, sev dil LA, triv  TR/PI;  c. 02/2015 Cath: LM nl, LAD 30d, LCX nl, OM1 small, nl, RCA nl;  d. 02/2015 s/p MDT single lead AICD (ser# [id number]).  . OSA (obstructive sleep apnea) 06/04/2015   Mild with AHI 12.3/hr and AHI 24.5/hr in REM sleep.  . Polysubstance abuse    History of quitting alcohol, marijuana and crack over 12 years ago  . Type II diabetes mellitus ([place])     Past Surgical History:  Procedure Laterality Date  . CARDIAC CATHETERIZATION N/A 03/06/2015   Procedure: Right/Left Heart Cath and Coronary Angiography;  Surgeon: Belva Crome, MD;  Location: [facility];  Service: Cardiovascular;  Laterality: N/A;  . EP IMPLANTABLE DEVICE N/A 03/06/2015   Procedure: ICD Implant;  Surgeon: Deboraha Sprang, MD;  Location: [facility];  Service: Cardiovascular;  Laterality: N/A;  . none      Current Medications: Current Outpatient Prescriptions  Medication Sig Dispense Refill  . aspirin EC 81 MG tablet Take 1 tablet (81 mg total) by mouth daily. 30 tablet 0  . atorvastatin (LIPITOR) 20 MG tablet Take 1 tablet by mouth daily.    . canagliflozin (INVOKANA) 300 MG TABS tablet Take 300 mg by mouth daily before breakfast.    . carvedilol (COREG) 12.5 MG tablet TAKE ONE TABLET BY MOUTH TWICE DAILY WITH A MEAL 180 tablet 2  . ivabradine (CORLANOR) 5 MG TABS tablet Take  1 tablet (5 mg total) by mouth 2 (two) times daily with a meal. 180 tablet 3  . lisinopril (PRINIVIL,ZESTRIL) 5 MG tablet TAKE ONE TABLET BY MOUTH ONCE DAILY 30 tablet 10  . potassium chloride (K-DUR) 10 MEQ tablet Take 1 tablet (10 mEq total) by mouth daily. 90 tablet 3  . sildenafil (REVATIO) 20 MG tablet Take 2-4 tablets by mouth as needed.    Marland Kitchen spironolactone (ALDACTONE) 25 MG tablet TAKE ONE TABLET BY MOUTH ONCE DAILY 90 tablet 2  . furosemide (LASIX) 40 MG tablet Take 1 tablet (40 mg total) by mouth 2 (two) times daily. 60 tablet 3  . potassium chloride SA (K-DUR,KLOR-CON) 20 MEQ tablet Take 1 tablet (20 mEq total) by mouth daily.  30 tablet 3  . sitaGLIPtin-metformin (JANUMET) 50-1000 MG tablet Take 1 tablet by mouth 2 (two) times daily.    . varenicline (CHANTIX PAK) 0.5 MG X 11 & 1 MG X 42 tablet Take by mouth 2 (two) times daily. Take one 0.5 mg tablet by mouth once daily for 3 days, then increase to one 0.5 mg tablet twice daily for 4 days, then increase to one 1 mg tablet twice daily.     No current facility-administered medications for this visit.      Allergies:   Review of patient's allergies indicates no known allergies.   Social History   Social History  . Marital status: Single    Spouse name: N/A  . Number of children: N/A  . Years of education: N/A   Occupational History  . 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 Smoking status: Current Every Day Smoker    Packs/day: 1.00    Years: 33.00    Types: Cigarettes  . Smokeless tobacco: Never Used  . Alcohol use No     Comment: QUIT IN 2000  . Drug use: No     Comment: quit 12 years ago  . Sexual activity: No   Other Topics Concern  . None   Social History Narrative   Lives in [place], [place] with housemate.      Family History:  The patient's family history includes Cancer in his mother.   ROS:   As per HPI All other systems are reviewed and otherwise negative.    PHYSICAL EXAM:   VS:  BP 124/70   Pulse 74   Ht 5\' 10"  (1.778 m)   Wt 266 lb (120.7 kg)   BMI 38.17 kg/m   BMI: Body mass index is 38.17 kg/m. GEN: Well nourished, well developed, in no acute distress  HEENT: normocephalic, atraumatic Neck: no JVD, carotid bruits, or masses Cardiac: RRR; no murmurs, rubs, or gallops, B/L +1 LE edema  Respiratory:  clear to auscultation bilaterally, normal work of breathing GI: soft, nontender, nondistended, + BS MS: no deformity or atrophy  Skin: warm and dry, no rash Neuro:  Alert and Oriented x 3, Strength and sensation are intact, follows commands Psych: euthymic mood, full affect  Wt  Readings from Last 3 Encounters:  06/12/16 266 lb (120.7 kg)  03/20/16 250 lb (113.4 kg)  02/08/16 251 lb (113.9 kg)    Studies/Labs Reviewed:   EKG:  EKG was ordered today and personally reviewed by me and demonstrates SR, biatrial enlargement, iLBBB. Unchanged from prior.  Recent Labs: 11/03/2015: ALT 8; Hemoglobin 14.1; Platelets 237; TSH 0.72 05/02/2016: BUN 34; Creat 1.33; Potassium 4.1; Sodium 136 06/12/2016: Brain Natriuretic Peptide 31.1  Lipid Panel    Component Value Date/Time   CHOL  05/22/2010 0318    173        ATP III CLASSIFICATION:  <200     mg/dL   Desirable  200-239  mg/dL   Borderline High  >=240    mg/dL   High          TRIG 73 05/22/2010 0318   HDL 42 05/22/2010 0318   CHOLHDL 4.1 05/22/2010 0318   VLDL 15 05/22/2010 0318   LDLCALC (H) 05/22/2010 0318    116        Total Cholesterol/HDL:CHD Risk Coronary Heart Disease Risk Table                     Men   Women  1/2 Average Risk   3.4   3.3  Average Risk       5.0   4.4  2 X Average Risk   9.6   7.1  3 X Average Risk  23.4   11.0        Use the calculated Patient Ratio above and the CHD Risk Table to determine the patient's CHD Risk.        ATP III CLASSIFICATION (LDL):  <100     mg/dL   Optimal  100-129  mg/dL   Near or Above                    Optimal  130-159  mg/dL   Borderline  160-189  mg/dL   High  >190     mg/dL   Very High    Additional studies/ records that were reviewed today include:  TTE: 02/08/15 - Left ventricle: The cavity size was severely dilated. There was moderate concentric hypertrophy. Systolic function was severely reduced. The estimated ejection fraction was in the range of 15-20%. Doppler parameters are consistent with restrictive physiology, indicative of decreased left ventricular diastolic compliance and/or increased left atrial pressure. Doppler parameters are consistent with both elevated ventricular end-diastolic filling pressure and elevated  left atrial filling pressure. - Mitral valve: There was moderate regurgitation. - Left atrium: The atrium was severely dilated. - Right atrium: The atrium was normal in size. - Atrial septum: No defect or patent foramen ovale was identified. - Tricuspid valve: There was trivial regurgitation. - Pulmonic valve: There was trivial regurgitation. - Pulmonary arteries: Systolic pressure was within the normal range. - Inferior vena cava: The vessel was normal in size. - Pericardium, extracardiac: There was no pericardial effusion. Impressions: - There is severe LV dilatation with moderate concentric LVH and severe systolic function impairement (LVEF = 15-20%). There is restrictive pattern of diastolic function with elevated filling pressures with significant left to right bowing of the interventricular and interatrial septum.  TTE 08/2015 Left ventricle: The cavity size was severely dilated. Wall   thickness was normal. The estimated ejection fraction was 20%.   Diffuse hypokinesis. Doppler parameters are consistent with   elevated ventricular end-diastolic filling pressure. - Mitral valve: There was mild to moderate regurgitation. - Left atrium: The atrium was moderately dilated.  Left cardiac cath : 03/06/2015 1. Mid to dist LAD lesion, 30% stenosed.   Dilated and globally hypocontractile left ventricle with elevated left ventricular end-diastolic (24 mmHg) and pulmonary capillary wedge (19 mmHg) pressures.  Left ventricular ejection fraction 15-20%.  Widely patent coronary arteries with mid to distal LAD less than 30% narrowing.   Assessment and Plan:   55 year old male  1. Acute on chronic combined chronic systolic and diastolic CHF - NYHA IIB-III - He has LE edema, but clear lungs, he is not significantly symptomatic yet 15 lbs above his weight 4 months ago.  His impedance monitor suggestive of worsening CHF, however BNP today 31. We will increase lasix to 40  mg po BID and follow up in 2 weeks.   2. Non-ischemic CMP - repeat echo showed LVEF 20%, no improvement of the LVEF, severely dilated LV, continue lisinopril, carvedilol, aspirin, spironolactone, Corlanor and lasix 40 mg po BID.   3. NIDDM - continue metformin 500 mg po bid  4. S/P ICD placement - no signs of infection, transmission normal  5. Erectile dysfunction - patient was prescribed Viagra, however unaffordable, he might reconsider in the future.  6. Smoking cessation - advised on a proper dose of Chantix that he discontinue taking, restart with 0.5 mg po daily x 10 days, then 1 mg po daily.  Follow up in 3 month. Check CMP, BNP today.   Disposition: F/u with a PA in 2 weeks   Medication Adjustments/Labs and Tests Ordered: Current medicines are reviewed at length with the patient today.  Concerns regarding medicines are outlined above. Medication changes, Labs and Tests ordered today are summarized above and listed in the Patient Instructions accessible in Encounters.   Raechel Ache PA-C  06/12/2016 5:20 PM    [facility] [address], [place], [place]  [address] Phone: [phone]; Fax: [phone]

## 2016-06-12 NOTE — Patient Instructions (Signed)
Medication Instructions:  Increase Furosemide to 40 mg twice daily and start taking Potassium 20 meq daily. All other medications remain the same.  Labwork: BMET and BNP today  Testing/Procedures: None  Follow-Up: In 2 weeks   If you need a refill on your cardiac medications before your next appointment, please call your pharmacy.

## 2016-06-12 NOTE — Progress Notes (Signed)
EPIC Encounter for ICM Monitoring  Patient Name: David Bright is a 55 y.o. male Date: 06/12/2016 Primary Care Physican: Luetta Nutting, DO Primary Cardiologist: [name] Electrophysiologist: Caryl Comes Dry Weight:    unknown      Reviewed transmission with Dr [name] at patients office visit today.    Thoracic impedance abnormal suggesting fluid accumulation.  LABS: 05/02/2016 Creatinine 1.33, BUN 34, Potassium 4.1, Sodium 136 03/20/2016 Creatinine 1.17, BUN 36, Potassium 4.7, Sodium 136 11/03/2015 Creatinine 1.03, BUN 17, Potassium 4.0, Sodium 140  Recommendations:  Recommendations will be given at office visit if needed  Follow-up plan: ICM clinic phone appointment on 06/20/2016 to recheck fluid levels.  Copy of ICM check sent to cardiologist and device physician.   ICM trend: 06/12/2016       [name], RN 06/12/2016 11:43 AM

## 2016-06-20 ENCOUNTER — Ambulatory Visit (INDEPENDENT_AMBULATORY_CARE_PROVIDER_SITE_OTHER): Payer: Medicaid Other

## 2016-06-20 DIAGNOSIS — I5022 Chronic systolic (congestive) heart failure: Secondary | ICD-10-CM

## 2016-06-20 DIAGNOSIS — Z95810 Presence of automatic (implantable) cardiac defibrillator: Secondary | ICD-10-CM

## 2016-06-20 NOTE — Progress Notes (Signed)
EPIC Encounter for ICM Monitoring  Patient Name: David Bright is a 55 y.o. male Date: 06/20/2016 Primary Care Physican: Luetta Nutting, DO Primary Cardiologist:Nelson Electrophysiologist: Caryl Comes Dry Weight:unknown                                          Heart Failure questions reviewed, pt has lost 3 lbs since last office visit on 06/12/2016.  Denied any fluid symptoms today  Thoracic impedance returned to normal 06/20/2016.  He reported he is taking meds as prescribed.  Confirmed Furosemide 40 mg bid and Potassium 20 meq 1 tablet daily  LABS: 06/12/2016 Creatinine 1.12, BUN 22, Potassium 4.0, Sodium 140 05/02/2016 Creatinine 1.33, BUN 34, Potassium 4.1, Sodium 136 03/20/2016 Creatinine 1.17, BUN 36, Potassium 4.7, Sodium 136 11/03/2015 Creatinine 1.03, BUN 17, Potassium 4.0, Sodium 140  Recommendations: No changes.  Advised to limit salt intake to 2000 mg daily.  Encouraged to call for fluid symptoms.    Follow-up plan: ICM clinic phone appointment on 06/27/2016.  Office appointment with Sharrell Ku, PA on 06/27/2016.  Copy of ICM check sent to primary cardiologist and device physician.   ICM trend: 06/20/2016       [name], RN 06/20/2016 12:57 PM

## 2016-06-27 ENCOUNTER — Ambulatory Visit (INDEPENDENT_AMBULATORY_CARE_PROVIDER_SITE_OTHER): Payer: Medicaid Other | Admitting: Physician Assistant

## 2016-06-27 ENCOUNTER — Telehealth: Payer: Self-pay | Admitting: Cardiology

## 2016-06-27 ENCOUNTER — Telehealth: Payer: Self-pay

## 2016-06-27 ENCOUNTER — Encounter: Payer: Self-pay | Admitting: Physician Assistant

## 2016-06-27 ENCOUNTER — Ambulatory Visit (INDEPENDENT_AMBULATORY_CARE_PROVIDER_SITE_OTHER): Payer: Medicaid Other

## 2016-06-27 VITALS — BP 106/64 | HR 84 | Ht 70.0 in | Wt 260.4 lb

## 2016-06-27 DIAGNOSIS — I10 Essential (primary) hypertension: Secondary | ICD-10-CM

## 2016-06-27 DIAGNOSIS — I428 Other cardiomyopathies: Secondary | ICD-10-CM

## 2016-06-27 DIAGNOSIS — I5042 Chronic combined systolic (congestive) and diastolic (congestive) heart failure: Secondary | ICD-10-CM | POA: Diagnosis not present

## 2016-06-27 DIAGNOSIS — I5022 Chronic systolic (congestive) heart failure: Secondary | ICD-10-CM

## 2016-06-27 DIAGNOSIS — F1910 Other psychoactive substance abuse, uncomplicated: Secondary | ICD-10-CM

## 2016-06-27 DIAGNOSIS — Z95810 Presence of automatic (implantable) cardiac defibrillator: Secondary | ICD-10-CM

## 2016-06-27 LAB — CBC WITH DIFFERENTIAL/PLATELET
BASOS PCT: 1 %
Basophils Absolute: 50 cells/uL (ref 0–200)
Eosinophils Absolute: 200 cells/uL (ref 15–500)
Eosinophils Relative: 4 %
HEMATOCRIT: 41.8 % (ref 38.5–50.0)
HEMOGLOBIN: 14.3 g/dL (ref 13.2–17.1)
LYMPHS ABS: 1450 {cells}/uL (ref 850–3900)
Lymphocytes Relative: 29 %
MCH: 30.2 pg (ref 27.0–33.0)
MCHC: 34.2 g/dL (ref 32.0–36.0)
MCV: 88.4 fL (ref 80.0–100.0)
MONO ABS: 450 {cells}/uL (ref 200–950)
MPV: 11.6 fL (ref 7.5–12.5)
Monocytes Relative: 9 %
NEUTROS ABS: 2850 {cells}/uL (ref 1500–7800)
Neutrophils Relative %: 57 %
Platelets: 241 10*3/uL (ref 140–400)
RBC: 4.73 MIL/uL (ref 4.20–5.80)
RDW: 13.8 % (ref 11.0–15.0)
WBC: 5 10*3/uL (ref 3.8–10.8)

## 2016-06-27 LAB — BASIC METABOLIC PANEL
BUN: 40 mg/dL — ABNORMAL HIGH (ref 7–25)
CHLORIDE: 101 mmol/L (ref 98–110)
CO2: 23 mmol/L (ref 20–31)
Calcium: 9.6 mg/dL (ref 8.6–10.3)
Creat: 1.33 mg/dL (ref 0.70–1.33)
Glucose, Bld: 89 mg/dL (ref 65–99)
POTASSIUM: 4.7 mmol/L (ref 3.5–5.3)
SODIUM: 133 mmol/L — AB (ref 135–146)

## 2016-06-27 NOTE — Patient Instructions (Addendum)
Medication Instructions:  Your physician recommends that you continue on your current medications as directed. Please refer to the Current Medication list given to you today.  Labwork: TODAY:  BMET, CBC, BNP  Testing/Procedures: None ordered  Follow-Up: Your physician recommends that you schedule a follow-up appointment in: 6 weeks with Melina Copa PA-C or Dr. [name]   Any Other Special Instructions Will Be Listed Below (If Applicable).  1. Follow a low-salt diet and watch your fluid intake. In general, you should not be taking in more than 2 liters of fluid per day (no more than 8 glasses per day). Some patients are restricted to less than 1.5 liters of fluid per day (no more than 6 glasses per day). This includes sources of water in foods like soup, coffee, tea, milk, etc. 2. Weigh yourself on the same scale at same time of day and keep a log. 3. Call your doctor: (Anytime you feel any of the following symptoms)  - 3-4 pound weight gain in 1-2 days or 2 pounds overnight  - Shortness of breath, with or without a dry hacking cough  - Swelling in the hands, feet or stomach  - If you have to sleep on extra pillows at night in order to breathe   IT IS IMPORTANT TO LET YOUR DOCTOR KNOW EARLY ON IF YOU ARE HAVING SYMPTOMS SO WE CAN HELP YOU!     If you need a refill on your cardiac medications before your next appointment, please call your pharmacy.

## 2016-06-27 NOTE — Telephone Encounter (Signed)
Spoke with pt and reminded pt of remote transmission that is due today. Pt verbalized understanding.   

## 2016-06-27 NOTE — Addendum Note (Signed)
Addended by: Eulis Foster on: 06/27/2016 03:24 PM   Modules accepted: Orders

## 2016-06-27 NOTE — Telephone Encounter (Signed)
Attempted call to patient to request an ICM remote transmission and no answer.

## 2016-06-27 NOTE — Progress Notes (Signed)
EPIC Encounter for ICM Monitoring  Patient Name: David Bright is a 55 y.o. male Date: 06/27/2016 Primary Care Physican: Luetta Nutting, DO Primary Cardiologist:Nelson Electrophysiologist: Caryl Comes Dry Weight:unknown                          Heart Failure questions reviewed, pt asymptomatic  Thoracic impedance normal since 06/18/2016.   LABS: 06/12/2016 Creatinine 1.12, BUN 22, Potassium 4.0, Sodium 140 05/02/2016 Creatinine 1.33, BUN 34, Potassium 4.1, Sodium 136 03/20/2016 Creatinine 1.17, BUN 36, Potassium 4.7, Sodium 136 11/03/2015 Creatinine 1.03, BUN 17, Potassium 4.0, Sodium 140  Recommendations: No changes.  Advised to limit salt intake to 2000 mg daily.  Encouraged to call for fluid symptoms.    Follow-up plan: ICM clinic phone appointment on 07/15/2016.  Appt with Melina Copa, PA today.  Copy of ICM check sent to device physician.   ICM trend: 06/26/2016       [name], RN 06/27/2016 1:43 PM

## 2016-06-27 NOTE — Progress Notes (Addendum)
Cardiology Office Note    Date:  06/27/2016  ID:  David Bright, DOB [birth year], MRN [id number] PCP:  Luetta Nutting, DO  Cardiologist:  [name]; EP: Caryl Comes   Chief Complaint: f/u CHF  History of Present Illness:  David Bright is a 55 y.o. male with history of chronic combined CHF/NICM (last cath 02/2015 with LM nl, LAD 30d, LCX nl, OM1 small, nl, RCA nl), s/p Medtronic ICD, COPD, HTN, hyperlipidemia, DM, hyperlipidemia, OSA, remote polysubstance abuse (h/o quitting alcohol, THC, crack), obesity, depression who presents for f/u of LEE. Per chart he has history of being lost to f/u due to loss of medical insurance and prior issue with running out of medications. Last echo 08/2015: severely dilated, EF 20%, diffuse HK, elev LV filling pressures, mild-mod MR, mod LAE. He saw Dr. [name] 06/12/16 at which time he had worsening edema and weight gain, prompting increase in Lasix to 40mg  BID. Thoracic impedence was abnormal suggesting fluid overload. He had repeat ICD monitoring with thoracic impedence returning to normal 06/20/16. BMET 06/12/16: Na 140, K 4.0, BUN 22, Cr 1.12, BNP 31.  He returns for follow-up today. His weight is down 6lb from last visit (260 today, 266 on 06/12/16 but previously 250 on 03/20/16). His previous weight in 2016 was around 258. His dry weight is unclear at this time as he says he has noticed he's eating more in general ever since working on cutting down smoking. He has good days and bad days. Some days he notices more DOE/orthopnea, other days not so much. His LEE has improved. Denies chest pain or any recurrent substance abuse (other than cigarettes). He says he drinks a lot of fluid a day - currently drinks 2 16-oz sodas a day in addition to several bottles of water. He also reports intermittent dietary indiscretion (i.e. had grits with butter and hot dogs this morning, but states this is not an every day thing).  Per ICM monitoring, thoracic impedence has remained normal since  06/18/16 - algorithms do show episodic fluctuations over the last several months.  Past Medical History:  Diagnosis Date  . Chronic combined systolic and diastolic CHF (congestive heart failure) (HCC)    Systolic and diastolic  . COPD (chronic obstructive pulmonary disease) ([facility])   . Hyperlipidemia   . Hypertension   . Non compliance with medical treatment   . Non-ischemic cardiomyopathy ([facility] [place])    a. Normal cath 2002;  b. 01/2015 Echo: EF 15-20%, mod conc LVH, restrictive physiology, mod MR, sev dil LA, triv TR/PI;  c. 02/2015 Cath: LM nl, LAD 30d, LCX nl, OM1 small, nl, RCA nl;  d. 02/2015 s/p MDT single lead AICD (ser# [id number]).  . OSA (obstructive sleep apnea) 06/04/2015   Mild with AHI 12.3/hr and AHI 24.5/hr in REM sleep.  . Polysubstance abuse    History of quitting alcohol, marijuana and crack over 12 years ago  . Type II diabetes mellitus ([facility])     Past Surgical History:  Procedure Laterality Date  . CARDIAC CATHETERIZATION N/A 03/06/2015   Procedure: Right/Left Heart Cath and Coronary Angiography;  Surgeon: Belva Crome, MD;  Location: [facility];  Service: Cardiovascular;  Laterality: N/A;  . EP IMPLANTABLE DEVICE N/A 03/06/2015   Procedure: ICD Implant;  Surgeon: Deboraha Sprang, MD;  Location: [facility];  Service: Cardiovascular;  Laterality: N/A;  . none      Current Medications: Current Outpatient Prescriptions  Medication Sig Dispense Refill  . aspirin EC  81 MG tablet Take 1 tablet (81 mg total) by mouth daily. 30 tablet 0  . atorvastatin (LIPITOR) 20 MG tablet Take 1 tablet by mouth daily.    . canagliflozin (INVOKANA) 300 MG TABS tablet Take 300 mg by mouth daily before breakfast.    . carvedilol (COREG) 12.5 MG tablet TAKE ONE TABLET BY MOUTH TWICE DAILY WITH A MEAL 180 tablet 2  . furosemide (LASIX) 40 MG tablet Take 1 tablet (40 mg total) by mouth 2 (two) times daily. 60 tablet 3  . ivabradine (CORLANOR) 5 MG TABS tablet Take 1 tablet (5 mg total)  by mouth 2 (two) times daily with a meal. 180 tablet 3  . lisinopril (PRINIVIL,ZESTRIL) 5 MG tablet TAKE ONE TABLET BY MOUTH ONCE DAILY 30 tablet 10  . potassium chloride (K-DUR) 10 MEQ tablet Take 1 tablet (10 mEq total) by mouth daily. 90 tablet 3  . potassium chloride SA (K-DUR,KLOR-CON) 20 MEQ tablet Take 1 tablet (20 mEq total) by mouth daily. 30 tablet 3  . sildenafil (REVATIO) 20 MG tablet Take 2-4 tablets by mouth as needed.    . sitaGLIPtin-metformin (JANUMET) 50-1000 MG tablet Take 1 tablet by mouth 2 (two) times daily.    Marland Kitchen spironolactone (ALDACTONE) 25 MG tablet TAKE ONE TABLET BY MOUTH ONCE DAILY 90 tablet 2  . varenicline (CHANTIX PAK) 0.5 MG X 11 & 1 MG X 42 tablet Take by mouth 2 (two) times daily. Take one 0.5 mg tablet by mouth once daily for 3 days, then increase to one 0.5 mg tablet twice daily for 4 days, then increase to one 1 mg tablet twice daily.     No current facility-administered medications for this visit.      Allergies:   Patient has no known allergies.   Social History   Social History  . Marital status: Single    Spouse name: N/A  . Number of children: N/A  . Years of education: N/A   Occupational History  . 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 Smoking status: Current Every Day Smoker    Packs/day: 1.00    Years: 33.00    Types: Cigarettes  . Smokeless tobacco: Never Used  . Alcohol use No     Comment: QUIT IN 2000  . Drug use: No     Comment: quit 12 years ago  . Sexual activity: No   Other Topics Concern  . None   Social History Narrative   Lives in [place], [place] with housemate.      Family History:  The patient's family history includes Cancer in his mother.   ROS:   Please see the history of present illness. All other systems are reviewed and otherwise negative.    PHYSICAL EXAM:   VS:  BP 106/64   Pulse 84   Ht 5\' 10"  (1.778 m)   Wt 260 lb 6.4 oz (118.1 kg)   BMI 37.36 kg/m   BMI: Body  mass index is 37.36 kg/m. GEN: Well nourished, well developed obese AAM in no acute distress  HEENT: normocephalic, atraumatic Neck: no JVD, carotid bruits, or masses Cardiac: RRR; no murmurs, rubs, or gallops, no edema  Respiratory: diminished BS bilaterally, no wheezes, rales or rhonchi normal work of breathing GI: soft, nontender, nondistended, + BS MS: no deformity or atrophy  Skin: warm and dry, no rash Neuro:  Alert and Oriented x 3, Strength and sensation are intact, follows commands Psych: euthymic  mood, full affect  Wt Readings from Last 3 Encounters:  06/27/16 260 lb 6.4 oz (118.1 kg)  06/12/16 266 lb (120.7 kg)  03/20/16 250 lb (113.4 kg)      Studies/Labs Reviewed:   EKG:  EKG was ordered today and personally reviewed by me and demonstrates NSR 85bpm, TWI I, avL. No change from prior.   Recent Labs: 11/03/2015: ALT 8; Hemoglobin 14.1; Platelets 237; TSH 0.72 06/12/2016: Brain Natriuretic Peptide 31.1; BUN 22; Creat 1.12; Potassium 4.0; Sodium 140   Lipid Panel   Additional studies/ records that were reviewed today include: Summarized above.    ASSESSMENT & PLAN:   1. Chronic combined CHF - edema has resolved. Recent BNP wnl. Thoracic impedence returned to normal. Continues to have "good days and bad days." Difficult to know where his dry weight has settled as he reports eating more than usual in the process of trying to quit smoking. We had a long discussion about lifestyle modifications including 2g sodium restriction and 64oz fluid restriction. I would continue higher dose of Lasix for now. Will recheck labs. If stable, may recommend he consider taking 1 extra tablet on the days when he notices weight gain and orthopnea. He declined nutritionist referral at this time.  Some of his dyspnea may also be related to his COPD and obesity. 2. NICM - continue current med regimen including spironolactone, ACEI, carvedilol, corlanor. BP will not allow med titration at this  time. 3. Essential HTN - controlled.  4. Polysubstance abuse - reports abstinence of everything except cigarettes. He is currently working on quitting.  Disposition: F/u with me or Dr. [name] in 6 weeks, sooner if needed.   Medication Adjustments/Labs and Tests Ordered: Current medicines are reviewed at length with the patient today.  Concerns regarding medicines are outlined above. Medication changes, Labs and Tests ordered today are summarized above and listed in the Patient Instructions accessible in Encounters.   Raechel Ache PA-C  06/27/2016 2:47 PM    [address], [place], [place]  [address] Phone: [phone]; Fax: [phone]

## 2016-06-27 NOTE — Telephone Encounter (Signed)
Spoke with patient and requested to send ICM remote transmission before his appointment today.  He stated he would do so.

## 2016-06-28 ENCOUNTER — Telehealth: Payer: Self-pay

## 2016-06-28 DIAGNOSIS — Z79899 Other long term (current) drug therapy: Secondary | ICD-10-CM

## 2016-06-28 LAB — BRAIN NATRIURETIC PEPTIDE: Brain Natriuretic Peptide: 11.6 pg/mL (ref <100)

## 2016-06-28 MED ORDER — FUROSEMIDE 40 MG PO TABS: 40.0000 mg | ORAL_TABLET | Freq: Every day | ORAL | 3 refills | Status: DC

## 2016-06-28 NOTE — Telephone Encounter (Signed)
-----   Message from David Bright, [place] sent at 06/28/2016 12:19 PM EST ----- Please let patient know his labs show he is slightly on the drier side. Would cut Lasix to 40mg  daily and take 1 extra tablet on the days when he feels more SOB/edema or weight gain.  Needs to be compliant with sodium restriction and fluid restriction or otherwise we're going to run into long-term fluid/kidney disease problems. Dayna Dunn PA-C

## 2016-06-28 NOTE — Progress Notes (Signed)
Please let patient know his labs show he is slightly on the drier side. Would cut Lasix to 40mg  daily and take 1 extra tablet on the days when he feels more SOB/edema or weight gain.  Needs to be compliant with sodium restriction and fluid restriction or otherwise we're going to run into long-term fluid/kidney disease problems. Dayna Dunn PA-C

## 2016-06-28 NOTE — Addendum Note (Signed)
Addended by: Aris Georgia, Longino Trefz L on: 06/28/2016 03:57 PM   Modules accepted: Orders

## 2016-06-28 NOTE — Telephone Encounter (Signed)
Left message for patient to call back. Patient has added instructions- Per Melina Copa PA, would also recommend he only take his potassium supplement on the days he takes the extra lasix. Please repeat BMET in 7- 10 days to trend.  Called patient again. Patient given additional instructions. Patient will come in on 07/08/16 for lab work. Patient verbalized understanding.

## 2016-06-28 NOTE — Telephone Encounter (Signed)
Called patient with lab results. Per Melina Copa PA,  Please let patient know his labs show he is slightly on the drier side. Would cut Lasix to 40mg  daily and take 1 extra tablet on the days when he feels more SOB/edema or weight gain.  Needs to be compliant with sodium restriction and fluid restriction or otherwise we're going to run into long-term fluid/kidney disease problems. Patient verbalized understanding.

## 2016-07-08 ENCOUNTER — Other Ambulatory Visit: Payer: Medicaid Other

## 2016-07-09 ENCOUNTER — Other Ambulatory Visit: Payer: Medicaid Other | Admitting: *Deleted

## 2016-07-09 DIAGNOSIS — Z79899 Other long term (current) drug therapy: Secondary | ICD-10-CM

## 2016-07-09 LAB — BASIC METABOLIC PANEL
BUN: 27 mg/dL — AB (ref 7–25)
CHLORIDE: 105 mmol/L (ref 98–110)
CO2: 29 mmol/L (ref 20–31)
CREATININE: 1.22 mg/dL (ref 0.70–1.33)
Calcium: 9.7 mg/dL (ref 8.6–10.3)
Glucose, Bld: 140 mg/dL — ABNORMAL HIGH (ref 65–99)
Potassium: 3.9 mmol/L (ref 3.5–5.3)
Sodium: 142 mmol/L (ref 135–146)

## 2016-07-15 ENCOUNTER — Ambulatory Visit (INDEPENDENT_AMBULATORY_CARE_PROVIDER_SITE_OTHER): Payer: Medicaid Other

## 2016-07-15 ENCOUNTER — Telehealth: Payer: Self-pay | Admitting: Cardiology

## 2016-07-15 DIAGNOSIS — I5022 Chronic systolic (congestive) heart failure: Secondary | ICD-10-CM | POA: Diagnosis not present

## 2016-07-15 DIAGNOSIS — Z95810 Presence of automatic (implantable) cardiac defibrillator: Secondary | ICD-10-CM

## 2016-07-15 NOTE — Progress Notes (Signed)
Margarita Grizzle, I would probably recommend bringing Mr. Josephson into the clinic for a recheck later this week if you can help arrange. He's not been very compliant with fluid/sodium restriction and is at risk for hospitalization if we can't get a handle on this again. Thanks! Dayna Dunn PA-C

## 2016-07-15 NOTE — Telephone Encounter (Signed)
Spoke with pt and reminded pt of remote transmission that is due today. Pt verbalized understanding.   

## 2016-07-15 NOTE — Progress Notes (Signed)
Call to patient and advised PA, Melina Copa would like for him to be seen in clinic this week.  He agreed to set up appointment.  He stated he will have to get a ride and will try to get the money so he can get here.  Will have scheduler call patient for the appointment.

## 2016-07-15 NOTE — Progress Notes (Signed)
EPIC Encounter for ICM Monitoring  Patient Name: David Bright is a 55 y.o. male Date: 07/15/2016 Primary Care Physican: Luetta Nutting, DO Primary Cardiologist:Nelson Electrophysiologist: Caryl Comes Dry Weight:270 lbs  Heart Failure questions reviewed, pt symptomatic with swelling at waist and ankle, and gain of approximately 10 lbs in the last 2 weeks.  Base weight is high 150's.        Thoracic impedance abnormal suggesting fluid accumulation since 07/04/2016.  LABS: 07/08/2016 Creatinine 1.22, BUN 27, Potassium 3.9, Sodium 142 06/12/2016 Creatinine 1.12, BUN 22, Potassium 4.0, Sodium 140 05/02/2016 Creatinine 1.33, BUN 34, Potassium 4.1, Sodium 136 03/20/2016 Creatinine 1.17, BUN 36, Potassium 4.7, Sodium 136       11/03/2015 Creatinine 1.03, BUN 17, Potassium 4.0, Sodium 140       Recommendations:  Increase Furosemide 40 mg to 1 tablet twice a day x 3 days along with Potassium 20 meq 1 tablet twice a day x 3 days.  After 3rd day return to prior dosage.  Reinforced to limit low salt food choices to 2000 mg day and limiting fluid intake to < 2 liters per day.   Follow-up plan: ICM clinic phone appointment on 07/19/2016 to recheck fluid levels.  Office appointment with Sharrell Ku, PA on 08/14/2016.  Copy of ICM check sent to Sharrell Ku, PA, Dr [name] and Dr Caryl Comes for review and recommendations if needed.     ICM trend: 07/15/2016       [name], RN 07/15/2016 3:40 PM

## 2016-07-18 ENCOUNTER — Encounter: Payer: Self-pay | Admitting: Physician Assistant

## 2016-07-18 DIAGNOSIS — F1910 Other psychoactive substance abuse, uncomplicated: Secondary | ICD-10-CM | POA: Insufficient documentation

## 2016-07-18 NOTE — Progress Notes (Addendum)
Cardiology Office Note    Date:  07/19/2016  ID:  David Bright, DOB 1960/08/28, MRN [id number] PCP:  Luetta Nutting, DO  Cardiologist:  Dr. [name]   Chief Complaint: f/u weight gain  History of Present Illness:  David Bright is a 55 y.o. male with history of chronic combined CHF/NICM (last cath 02/2015 with LM nl, LAD 30d, LCX nl, OM1 small, nl, RCA nl), s/p Medtronic ICD, COPD, HTN, hyperlipidemia, DM, hyperlipidemia, OSA, remote polysubstance abuse (h/o quitting alcohol, THC, crack), obesity, depression, borderline Cr who presents for f/u of LEE. Per chart he has history of being lost to f/u due to loss of medical insurance and prior issue with running out of medications. Last echo 08/2015: severely dilated, EF 20%, diffuse HK, elev LV filling pressures, mild-mod MR, mod LAE. He was seen in follow up in 06/2016 with transient adjustment of his diuretic due to worsening edema and weight gain, correlating with abnormal thoracic impedence. In follow-up 06/27/16 he had lost 6lb from prior visit with normalization of thoracic impedence, but was still up 10lb from prior in 03/2016. However, he reported eating more food overall since cutting down smoking so dry weight not totally clear. He also reported dietary indiscretion with excessive fluid intake and intermittent salt intake. BMET 07/09/16 showed BUN 27, Cr 1.22, K 3.9, prompting change in Lasix to one tablet daily with one extra tablet PRN.   His device was followed recently with abnormal thoracic impedance suggesting fluid accumulation since 07/04/2016. On 07/15/16 he was advised to increase Lasix to 40mg  BID + KCl to 1 tablet BID x 3 days. He has since done so with improvement in his edema. Weight is still up 3lbs compared to last OV. His breathing is stable. No chest pain. Continues to swerve outside the low sodium recommendation periodically (i.e. Oodles of Noodles, fried mushrooms). He says he is taking potassium 31meq tablets as 1 tablet BID  because he does not yet have the money to get the 19meq tablets yet.  Past Medical History:  Diagnosis Date  . Chronic combined systolic and diastolic CHF (congestive heart failure) (HCC)    Systolic and diastolic  . COPD (chronic obstructive pulmonary disease) ([facility])   . Hyperlipidemia   . Hypertension   . Non compliance with medical treatment   . Non-ischemic cardiomyopathy ([facility])    a. Normal cath 2002;  b. 01/2015 Echo: EF 15-20%, mod conc LVH, restrictive physiology, mod MR, sev dil LA, triv TR/PI;  c. 02/2015 Cath: LM nl, LAD 30d, LCX nl, OM1 small, nl, RCA nl;  d. 02/2015 s/p MDT single lead AICD (ser# [id number]).  . OSA (obstructive sleep apnea) 06/04/2015   Mild with AHI 12.3/hr and AHI 24.5/hr in REM sleep.  . Polysubstance abuse    History of quitting alcohol, marijuana and crack over 12 years ago  . Type II diabetes mellitus ([facility])     Past Surgical History:  Procedure Laterality Date  . CARDIAC CATHETERIZATION N/A 03/06/2015   Procedure: Right/Left Heart Cath and Coronary Angiography;  Surgeon: Belva Crome, MD;  Location: [facility];  Service: Cardiovascular;  Laterality: N/A;  . EP IMPLANTABLE DEVICE N/A 03/06/2015   Procedure: ICD Implant;  Surgeon: Deboraha Sprang, MD;  Location: [facility];  Service: Cardiovascular;  Laterality: N/A;  . none      Current Medications: Current Outpatient Prescriptions  Medication Sig Dispense Refill  . aspirin EC 81 MG tablet Take 1 tablet (81 mg total)  by mouth daily. 30 tablet 0  . atorvastatin (LIPITOR) 20 MG tablet Take 1 tablet by mouth daily.    . canagliflozin (INVOKANA) 300 MG TABS tablet Take 300 mg by mouth daily before breakfast.    . carvedilol (COREG) 12.5 MG tablet TAKE ONE TABLET BY MOUTH TWICE DAILY WITH A MEAL 180 tablet 2  . furosemide (LASIX) 40 MG tablet Take 40 mg by mouth 2 (two) times daily.    . ivabradine (CORLANOR) 5 MG TABS tablet Take 1 tablet (5 mg total) by mouth 2 (two) times daily with a  meal. 180 tablet 3  . lisinopril (PRINIVIL,ZESTRIL) 5 MG tablet TAKE ONE TABLET BY MOUTH ONCE DAILY 30 tablet 10  . potassium chloride SA (K-DUR,KLOR-CON) 20 MEQ tablet Take 1 tablet (20 mEq total) by mouth daily. 30 tablet 3  . sildenafil (REVATIO) 20 MG tablet Take 2-4 tablets by mouth as needed.    Marland Kitchen spironolactone (ALDACTONE) 25 MG tablet TAKE ONE TABLET BY MOUTH ONCE DAILY 90 tablet 2  . varenicline (CHANTIX PAK) 0.5 MG X 11 & 1 MG X 42 tablet Take by mouth 2 (two) times daily. Take one 0.5 mg tablet by mouth once daily for 3 days, then increase to one 0.5 mg tablet twice daily for 4 days, then increase to one 1 mg tablet twice daily.    . sitaGLIPtin-metformin (JANUMET) 50-1000 MG tablet Take 1 tablet by mouth 2 (two) times daily.     No current facility-administered medications for this visit.      Allergies:   Patient has no known allergies.   Social History   Social History  . Marital status: Single    Spouse name: N/A  . Number of children: N/A  . Years of education: N/A   Occupational History  . 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 Smoking status: Current Every Day Smoker    Packs/day: 1.00    Years: 33.00    Types: Cigarettes  . Smokeless tobacco: Never Used  . Alcohol use No     Comment: QUIT IN 2000  . Drug use: No     Comment: quit 12 years ago  . Sexual activity: No   Other Topics Concern  . None   Social History Narrative   Lives in [place], [place] with housemate.      Family History:  The patient's family history includes Cancer in his mother.  ROS:   Please see the history of present illness.  All other systems are reviewed and otherwise negative.    PHYSICAL EXAM:   VS:  BP 120/60   Pulse 64   Ht 6' (1.829 m)   Wt 263 lb (119.3 kg)   BMI 35.67 kg/m   BMI: Body mass index is 35.67 kg/m. GEN: Well nourished, well developed obese AAM in no acute distress  HEENT: normocephalic, atraumatic Neck: no JVD,  carotid bruits, or masses Cardiac: RRR; no murmurs, rubs, or gallops, trace BLE edema  Respiratory:  clear to auscultation bilaterally, normal work of breathing GI: soft, nontender, nondistended, + BS MS: no deformity or atrophy  Skin: warm and dry, very dry skin, no rash Neuro:  Alert and Oriented x 3, Strength and sensation are intact, follows commands Psych: euthymic mood, full affect  Wt Readings from Last 3 Encounters:  07/19/16 263 lb (119.3 kg)  06/27/16 260 lb 6.4 oz (118.1 kg)  06/12/16 266 lb (120.7 kg)  Studies/Labs Reviewed:   EKG:   EKG was not ordered today.  Recent Labs: 11/03/2015: ALT 8; TSH 0.72 06/27/2016: Brain Natriuretic Peptide 11.6; Hemoglobin 14.3; Platelets 241 07/09/2016: BUN 27; Creat 1.22; Potassium 3.9; Sodium 142   Lipid Panel   Additional studies/ records that were reviewed today include: Summarized above.    ASSESSMENT & PLAN:   1. Chronic combined CHF - suspect he needs to remain on the higher dose Lasix for now. Will check BMET to help guide instructions for potassium. Long discussion again about sodium and restriction. 2. NICM - continue current regimen which includes BB, Corlanor, ACEI, spironolactone. Looking at his overall history I think he would benefit from referral to the AHF clinic, especially for their expertise in social resources and medication assistance as well as discussion regarding advanced therapies. May be a candidate for Entresto but I worry about his ability to afford his medication without assistance of social work.  3. Essential HTN - controlled. 4. Polysubstance abuse - still working on cutting down smoking but he unfortunately has been substituting the cigarettes with regular food, which may be contributing to weight gain. He reports abstinence of other substances.  Disposition: Will tentatively schedule f/u with Dr. [name] in 2-3 months but will also make referral to [facility]. When we call  him with his BMET result, we will let him know we would like to refer him. I contemplated this decision after the office visit was finished.  Medication Adjustments/Labs and Tests Ordered: Current medicines are reviewed at length with the patient today.  Concerns regarding medicines are outlined above. Medication changes, Labs and Tests ordered today are summarized above and listed in the Patient Instructions accessible in Encounters.   Raechel Ache PA-C  07/19/2016 11:33 AM    [address], [place], [place]  [address] Phone: [phone]; Fax: [phone]

## 2016-07-19 ENCOUNTER — Ambulatory Visit (INDEPENDENT_AMBULATORY_CARE_PROVIDER_SITE_OTHER): Payer: Medicaid Other

## 2016-07-19 ENCOUNTER — Ambulatory Visit (INDEPENDENT_AMBULATORY_CARE_PROVIDER_SITE_OTHER): Payer: Medicaid Other | Admitting: Physician Assistant

## 2016-07-19 ENCOUNTER — Telehealth: Payer: Self-pay | Admitting: Cardiology

## 2016-07-19 ENCOUNTER — Encounter: Payer: Self-pay | Admitting: Physician Assistant

## 2016-07-19 VITALS — BP 120/60 | HR 64 | Ht 72.0 in | Wt 263.0 lb

## 2016-07-19 DIAGNOSIS — I5043 Acute on chronic combined systolic (congestive) and diastolic (congestive) heart failure: Secondary | ICD-10-CM

## 2016-07-19 DIAGNOSIS — I10 Essential (primary) hypertension: Secondary | ICD-10-CM

## 2016-07-19 DIAGNOSIS — F1910 Other psychoactive substance abuse, uncomplicated: Secondary | ICD-10-CM

## 2016-07-19 DIAGNOSIS — I5022 Chronic systolic (congestive) heart failure: Secondary | ICD-10-CM

## 2016-07-19 DIAGNOSIS — I428 Other cardiomyopathies: Secondary | ICD-10-CM

## 2016-07-19 DIAGNOSIS — Z95810 Presence of automatic (implantable) cardiac defibrillator: Secondary | ICD-10-CM

## 2016-07-19 LAB — BASIC METABOLIC PANEL
BUN: 27 mg/dL — ABNORMAL HIGH (ref 7–25)
CHLORIDE: 102 mmol/L (ref 98–110)
CO2: 28 mmol/L (ref 20–31)
Calcium: 9.2 mg/dL (ref 8.6–10.3)
Creat: 1.09 mg/dL (ref 0.70–1.33)
Glucose, Bld: 120 mg/dL — ABNORMAL HIGH (ref 65–99)
POTASSIUM: 4.3 mmol/L (ref 3.5–5.3)
SODIUM: 138 mmol/L (ref 135–146)

## 2016-07-19 NOTE — Telephone Encounter (Signed)
Spoke with pt and reminded pt of remote transmission that is due today. Pt verbalized understanding.   

## 2016-07-19 NOTE — Patient Instructions (Signed)
Medication Instructions:  Your physician recommends that you continue on your current medications as directed. Please refer to the Current Medication list given to you today.   Labwork: TODAY:  BMET  Testing/Procedures: None ordered  Follow-Up: Your physician recommends that you schedule a follow-up appointment in: 2-3 MONTHS WITH DR. NELSON   Any Other Special Instructions Will Be Listed Below (If Applicable).     If you need a refill on your cardiac medications before your next appointment, please call your pharmacy.   

## 2016-07-22 ENCOUNTER — Telehealth: Payer: Self-pay | Admitting: Physician Assistant

## 2016-07-22 NOTE — Progress Notes (Signed)
EPIC Encounter for ICM Monitoring  Patient Name: David Bright is a 55 y.o. male Date: 07/22/2016 Primary Care Physican: Luetta Nutting, DO Primary Cardiologist:Nelson Electrophysiologist: Caryl Comes Dry Weight:260 lbs      Heart Failure questions reviewed, pt reported 10 lb weight loss from 270 lb and no swelling of extremities.    Thoracic impedance returned to normal after increase in Furosemide.    LABS: 07/19/2016 Creatinine 1.09, BUN 27, Potassium 4.3, Sodium 138 07/08/2016 Creatinine 1.22, BUN 27, Potassium 3.9, Sodium 142 06/12/2016 Creatinine 1.12, BUN 22, Potassium 4.0, Sodium 140 05/02/2016 Creatinine 1.33, BUN 34, Potassium 4.1, Sodium 136 03/20/2016 Creatinine 1.17, BUN 36, Potassium 4.7, Sodium 136       11/03/2015 Creatinine 1.03, BUN 17, Potassium 4.0, Sodium 140           Recommendations: Discussed salt limitations.  He is trying to quit smoking and has been eating more.  Reinforced to limit low salt food choices to 2000 mg day and limiting fluid intake to < 2 liters per day. Encouraged to call for fluid symptoms.   Provided patient with lab results and advised Melina Copa, PA recommends he continue with Lasix 40 BID + KCl 61meq daily.  Advised she has recommended Advanced HF clinic referral and he agreed.  Advised HF clinic will call to set up appointment with him.   Follow-up plan: ICM clinic phone appointment on 08/22/2016.  Copy of ICM check sent to primary cardiologist and device physician for updated normal transmission.   ICM trend: 07/22/2016       [name], RN 07/22/2016 3:30 PM

## 2016-07-22 NOTE — Telephone Encounter (Signed)
pt returned call to [facility].--pls call [phone] after 1p

## 2016-07-23 NOTE — Telephone Encounter (Signed)
Sharman Cheek, RN, called and discussed with the pt.

## 2016-08-14 ENCOUNTER — Ambulatory Visit: Payer: Medicaid Other | Admitting: Physician Assistant

## 2016-08-22 ENCOUNTER — Ambulatory Visit (INDEPENDENT_AMBULATORY_CARE_PROVIDER_SITE_OTHER): Payer: Medicaid Other

## 2016-08-22 DIAGNOSIS — Z95810 Presence of automatic (implantable) cardiac defibrillator: Secondary | ICD-10-CM

## 2016-08-22 DIAGNOSIS — I5022 Chronic systolic (congestive) heart failure: Secondary | ICD-10-CM

## 2016-08-22 NOTE — Progress Notes (Signed)
EPIC Encounter for ICM Monitoring  Patient Name: ALIAN VANDUYNE is a 56 y.o. male Date: 08/22/2016 Primary Care Physican: Luetta Nutting, DO Primary Cardiologist:Nelson Electrophysiologist: Caryl Comes Dry Weight:260 lbs                                                Heart Failure questions reviewed, pt asymptomatic   LABS: 07/19/2016 Creatinine 1.09, BUN 27, Potassium 4.3, Sodium 138 07/08/2016 Creatinine 1.22, BUN 27, Potassium 3.9, Sodium 142 06/12/2016 Creatinine 1.12, BUN 22, Potassium 4.0, Sodium 140 05/02/2016 Creatinine 1.33, BUN 34, Potassium 4.1, Sodium 136 03/20/2016 Creatinine 1.17, BUN 36, Potassium 4.7, Sodium 136 11/03/2015 Creatinine 1.03, BUN 17, Potassium 4.0, Sodium 140  Recommendations: No changes.  Reinforced to limit low salt food choices to 2000 mg day and limiting fluid intake to < 2 liters per day. Encouraged to call for fluid symptoms.    Follow-up plan: ICM clinic phone appointment on 09/24/2016.   [facility]st visit to [facility] 09/03/2016  Copy of ICM check sent to device physician.   3 month ICM trend : 08/22/2016   1 Year ICM trend:     [name], RN 08/22/2016 9:33 AM

## 2016-08-24 ENCOUNTER — Encounter: Payer: Self-pay | Admitting: Cardiology

## 2016-09-03 ENCOUNTER — Encounter (HOSPITAL_COMMUNITY): Payer: Self-pay

## 2016-09-03 ENCOUNTER — Ambulatory Visit (HOSPITAL_COMMUNITY)
Admission: RE | Admit: 2016-09-03 | Discharge: 2016-09-03 | Disposition: A | Discharge status: Home / Self Care | Payer: Medicaid Other | Source: Ambulatory Visit | Attending: Cardiology | Admitting: Cardiology

## 2016-09-03 VITALS — BP 110/66 | HR 102 | Ht 72.0 in | Wt 268.8 lb

## 2016-09-03 DIAGNOSIS — Z5189 Encounter for other specified aftercare: Secondary | ICD-10-CM | POA: Diagnosis present

## 2016-09-03 DIAGNOSIS — I429 Cardiomyopathy, unspecified: Secondary | ICD-10-CM | POA: Diagnosis not present

## 2016-09-03 DIAGNOSIS — I5022 Chronic systolic (congestive) heart failure: Secondary | ICD-10-CM | POA: Insufficient documentation

## 2016-09-03 DIAGNOSIS — Z7982 Long term (current) use of aspirin: Secondary | ICD-10-CM | POA: Diagnosis not present

## 2016-09-03 DIAGNOSIS — Z79899 Other long term (current) drug therapy: Secondary | ICD-10-CM | POA: Diagnosis not present

## 2016-09-03 DIAGNOSIS — I110 Hypertensive heart disease with heart failure: Secondary | ICD-10-CM | POA: Insufficient documentation

## 2016-09-03 DIAGNOSIS — Z7984 Long term (current) use of oral hypoglycemic drugs: Secondary | ICD-10-CM | POA: Diagnosis not present

## 2016-09-03 DIAGNOSIS — F17210 Nicotine dependence, cigarettes, uncomplicated: Secondary | ICD-10-CM | POA: Insufficient documentation

## 2016-09-03 DIAGNOSIS — I5042 Chronic combined systolic (congestive) and diastolic (congestive) heart failure: Secondary | ICD-10-CM

## 2016-09-03 DIAGNOSIS — I400 Infective myocarditis: Secondary | ICD-10-CM | POA: Diagnosis not present

## 2016-09-03 DIAGNOSIS — G4733 Obstructive sleep apnea (adult) (pediatric): Secondary | ICD-10-CM | POA: Insufficient documentation

## 2016-09-03 LAB — BRAIN NATRIURETIC PEPTIDE: B Natriuretic Peptide: 20.3 pg/mL (ref 0.0–100.0)

## 2016-09-03 LAB — BASIC METABOLIC PANEL
ANION GAP: 11 (ref 5–15)
BUN: 28 mg/dL — AB (ref 6–20)
CALCIUM: 9.2 mg/dL (ref 8.9–10.3)
CO2: 25 mmol/L (ref 22–32)
Chloride: 102 mmol/L (ref 101–111)
Creatinine, Ser: 1.13 mg/dL (ref 0.61–1.24)
GFR calc Af Amer: >60 mL/min (ref >60)
GLUCOSE: 104 mg/dL — AB (ref 65–99)
Potassium: 3.9 mmol/L (ref 3.5–5.1)
Sodium: 138 mmol/L (ref 135–145)

## 2016-09-03 MED ORDER — ISOSORB DINITRATE-HYDRALAZINE 20-37.5 MG PO TABS: 0.5000 | ORAL_TABLET | Freq: Three times a day (TID) | ORAL | 3 refills | Status: DC

## 2016-09-03 NOTE — Progress Notes (Signed)
PCP: [facility] Cardiology: Dr. [name] HF Cardiology: Dr. Aundra Dubin  56 yo with history of chronic systolic CHF and COPD/active smoking presents for CHF clinic evaluation.  He has a known nonischemic cardiomyopathy, last echo in 1/17 with EF 20% and a severely dilated LV.  No coronary disease on 7/16 cardiac cath.    He is trying stop smoking with Chantix, still using about 1/2 ppd. He has exertional dyspnea after walking about 40-50 yards or walking up a flight of steps.  This is chronic.  No dyspnea walking around his house.  No lightheadedness or palpitations.   No orthopnea/PND.  No chest pain.  He has OSA and is using CPAP.   ECG (11/17): NSR, biatrial enlargement, narrow QRS  Labs (12/17): K 4.3, creatinine 1.09  Optivol: fluid index < threshold but impedance starting to trend down.   PMH:  1. Chronic systolic CHF: Nonischemic cardiomyopathy.  Has Medtronic ICD.   - LHC (7/16) with nonobstructive disease.  - Echo (1/17) with EF 20%, severe LV dilation, mild to moderate MR.  2. COPD: Active smoker.  3. Type II diabetes with peripheral neuropathy.  4. OSA on CPAP 5. HTN 6. Hyperlipidemia  SH: Lives in [place], works at [employer], smokes about 1 ppd.  Rare ETOH.  Remote substance abuse.   FH: Aunt with CHF, brother with ESRD.    ROS: All systems reviewed and negative except as per HPI.  Current Outpatient Prescriptions  Medication Sig Dispense Refill  . aspirin EC 81 MG tablet Take 1 tablet (81 mg total) by mouth daily. 30 tablet 0  . atorvastatin (LIPITOR) 20 MG tablet Take 1 tablet by mouth daily.    . canagliflozin (INVOKANA) 300 MG TABS tablet Take 300 mg by mouth daily before breakfast.    . carvedilol (COREG) 12.5 MG tablet TAKE ONE TABLET BY MOUTH TWICE DAILY WITH A MEAL 180 tablet 2  . furosemide (LASIX) 40 MG tablet Take 40 mg by mouth 2 (two) times daily.    . ivabradine (CORLANOR) 5 MG TABS tablet Take 1 tablet (5 mg total) by mouth 2 (two) times daily with  a meal. 180 tablet 3  . lisinopril (PRINIVIL,ZESTRIL) 5 MG tablet TAKE ONE TABLET BY MOUTH ONCE DAILY 30 tablet 10  . potassium chloride SA (K-DUR,KLOR-CON) 20 MEQ tablet Take 1 tablet (20 mEq total) by mouth daily. 30 tablet 3  . sildenafil (REVATIO) 20 MG tablet Take 2-4 tablets by mouth as needed.    . sitaGLIPtin-metformin (JANUMET) 50-1000 MG tablet Take 1 tablet by mouth 2 (two) times daily.    Marland Kitchen spironolactone (ALDACTONE) 25 MG tablet TAKE ONE TABLET BY MOUTH ONCE DAILY 90 tablet 2  . varenicline (CHANTIX PAK) 0.5 MG X 11 & 1 MG X 42 tablet Take by mouth 2 (two) times daily. Take one 0.5 mg tablet by mouth once daily for 3 days, then increase to one 0.5 mg tablet twice daily for 4 days, then increase to one 1 mg tablet twice daily.    . isosorbide-hydrALAZINE (BIDIL) 20-37.5 MG tablet Take 0.5 tablets by mouth 3 (three) times daily. 90 tablet 3   No current facility-administered medications for this encounter.    BP 110/66 (BP Location: Left Arm, Patient Position: Sitting, Cuff Size: Large)   Pulse (!) 102   Ht 6' (1.829 m)   Wt 268 lb 12.8 oz (121.9 kg)   SpO2 98%   BMI 36.46 kg/m  General: NAD Neck: No JVD, no thyromegaly or  thyroid nodule.  Lungs: Occasional rhonchi CV: Nondisplaced PMI.  Heart regular S1/S2, no S3/S4, no murmur.  No peripheral edema.  No carotid bruit.  Normal pedal pulses.  Abdomen: Soft, nontender, no hepatosplenomegaly, no distention.  Skin: Intact without lesions or rashes.  Neurologic: Alert and oriented x 3.  Psych: Normal affect. Extremities: No clubbing or cyanosis.  HEENT: Normal.   Assessment/Plan: 1. Chronic systolic CHF: Nonischemic cardiomyopathy => ?due to viral myocarditis or prior substance abuse. .  Medtronic ICD.  Echo 1/17 with severely dilated LV and EF 20%.  NYHA class III symptoms.  Despite symptoms, he does not appear significantly volume overloaded by exam or by Optivol.  I suspect some of his dyspnea is due to COPD (active smoker).   - Continue Lasix 40 mg bid, BMET/BNP today.  - Continue current Coreg, ivabradine and spironolactone.  - Continue current lisinopril, may be able to transition to [facility] in the future.  - Add Bidil 1/2 tab tid.  - I will arrange for repeat echo at followup appointment.  - Narrow QRS, no indication for CRT upgrade.  2. Smoking/suspect COPD: He is using Chantix to try to quit smoking.  I am going to get PFTs to assess degree of COPD as I suspect that this is contributing to his dyspnea.  3. OSA: Continue CPAP.   Loralie Champagne 09/03/2016

## 2016-09-03 NOTE — Patient Instructions (Signed)
START Bidil one half tab three times per day  Your physician has recommended that you have a pulmonary function test. Pulmonary Function Tests are a group of tests that measure how well air moves in and out of your lungs.  Your physician has requested that you have an echocardiogram. Echocardiography is a painless test that uses sound waves to create images of your heart. It provides your doctor with information about the size and shape of your heart and how well your heart's chambers and valves are working. This procedure takes approximately one hour. There are no restrictions for this procedure.  Labs today We will only contact you if something comes back abnormal or we need to make some changes. Otherwise no news is good news!  Your physician recommends that you schedule a follow-up appointment in: 1 month with and echo

## 2016-09-07 ENCOUNTER — Other Ambulatory Visit: Payer: Self-pay | Admitting: Cardiology

## 2016-09-18 DIAGNOSIS — E559 Vitamin D deficiency, unspecified: Secondary | ICD-10-CM | POA: Insufficient documentation

## 2016-09-24 ENCOUNTER — Ambulatory Visit (INDEPENDENT_AMBULATORY_CARE_PROVIDER_SITE_OTHER): Payer: Medicaid Other

## 2016-09-24 DIAGNOSIS — I5042 Chronic combined systolic (congestive) and diastolic (congestive) heart failure: Secondary | ICD-10-CM | POA: Diagnosis not present

## 2016-09-24 DIAGNOSIS — Z95810 Presence of automatic (implantable) cardiac defibrillator: Secondary | ICD-10-CM | POA: Diagnosis not present

## 2016-09-24 NOTE — Progress Notes (Signed)
EPIC Encounter for ICM Monitoring  Patient Name: David Bright is a 56 y.o. male Date: 09/24/2016 Primary Care Physican: Luetta Nutting, DO Primary Cardiologist:Nelson/[facility]  Electrophysiologist: Caryl Comes Dry Weight:270 lbs      Heart Failure questions reviewed, pt symptomatic with some shortness of breath   Thoracic impedance abnormal suggesting fluid accumulation since 08/27/2016 and fluid index > threshold.  He has been eating out at restaurants more due to taking his brother to dialysis and being busy.  Advised to limit salt to 2000 mg daily and to read food labels.         Current Prescribed dosage: Confirmed Furosemide 40 mg 1 tablet bid and Potassium 20 mEq 1 tablet daily  LABS: 09/03/2016 Creatinine 1.13, BUN 28, Potassium 3.9, Sodium 138 07/19/2016 Creatinine 1.09, BUN 27, Potassium 4.3, Sodium 138 07/08/2016 Creatinine 1.22, BUN 27, Potassium 3.9, Sodium 142 06/12/2016 Creatinine 1.12, BUN 22, Potassium 4.0, Sodium 140 05/02/2016 Creatinine 1.33, BUN 34, Potassium 4.1, Sodium 136 03/20/2016 Creatinine 1.17, BUN 36, Potassium 4.7, Sodium 136 11/03/2015 Creatinine 1.03, BUN 17, Potassium 4.0, Sodium 140  Recommendations:   Advised to increase Furosemide 40 mg to 2 tablets every am and 1 tablet every pm x 2 days and increase Potassium 20 mEq 1 tablet twice a day x 2 days.  After 2nd day then return to prior dosages of Furosemide 40 mg bid to Potassium 20 mEq 1 tablet daily.   Follow-up plan: ICM clinic phone appointment on 09/27/2016 to recheck fluid levels.  HF clinic appointment 10/04/2016 and Echo.  Office appt with Dr [name] 10/17/2016  Copy of ICM check sent to cardiologists and device physician.   3 month ICM trend: 09/24/2016   1 Year ICM trend:      [name], RN 09/24/2016 8:16 AM

## 2016-09-25 DIAGNOSIS — Z6836 Body mass index (BMI) 36.0-36.9, adult: Secondary | ICD-10-CM | POA: Insufficient documentation

## 2016-09-27 ENCOUNTER — Telehealth: Payer: Self-pay

## 2016-09-27 ENCOUNTER — Ambulatory Visit (INDEPENDENT_AMBULATORY_CARE_PROVIDER_SITE_OTHER): Payer: Medicaid Other

## 2016-09-27 DIAGNOSIS — I5042 Chronic combined systolic (congestive) and diastolic (congestive) heart failure: Secondary | ICD-10-CM

## 2016-09-27 DIAGNOSIS — Z95810 Presence of automatic (implantable) cardiac defibrillator: Secondary | ICD-10-CM

## 2016-09-27 NOTE — Progress Notes (Signed)
EPIC Encounter for ICM Monitoring  Patient Name: David Bright is a 56 y.o. male Date: 09/27/2016 Primary Care Physican: Luetta Nutting, DO Primary Cardiologist:Nelson/[facility]  Electrophysiologist: Caryl Comes Dry Weight: unknown              Attempted call to patient and unable to reach.   Transmission reviewed.    Thoracic impedance returned to normal after 2 days of increased Furosemide and Potassium.        Current prescribed dose of Furosemide 40 mg 1 tablet bid and Potassium 20 mEq 1 tablet daily  LABS: 09/03/2016 Creatinine 1.13, BUN 28, Potassium 3.9, Sodium 138 07/19/2016 Creatinine 1.09, BUN 27, Potassium 4.3, Sodium 138 07/08/2016 Creatinine 1.22, BUN 27, Potassium 3.9, Sodium 142 06/12/2016 Creatinine 1.12, BUN 22, Potassium 4.0, Sodium 140 05/02/2016 Creatinine 1.33, BUN 34, Potassium 4.1, Sodium 136 03/20/2016 Creatinine 1.17, BUN 36, Potassium 4.7, Sodium 136 11/03/2015 Creatinine 1.03, BUN 17, Potassium 4.0, Sodium 140  Recommendations: NONE - Unable to reach patient   Follow-up plan: ICM clinic phone appointment on 10/15/2016 and 10/28/2016.  Office appointment with Dr [name] on 10/16/2016  Copy of ICM check sent to primary cardiologist and device physician.   3 month ICM trend: 09/27/2016   1 Year ICM trend:      [name], RN 09/27/2016 10:29 AM

## 2016-09-27 NOTE — Telephone Encounter (Signed)
Remote ICM transmission received.  Attempted patient call and no answer or answering machine.  

## 2016-10-04 ENCOUNTER — Ambulatory Visit (HOSPITAL_BASED_OUTPATIENT_CLINIC_OR_DEPARTMENT_OTHER)
Admission: RE | Admit: 2016-10-04 | Discharge: 2016-10-04 | Disposition: A | Discharge status: Home / Self Care | Payer: Medicaid Other | Source: Ambulatory Visit | Attending: Cardiology | Admitting: Cardiology

## 2016-10-04 ENCOUNTER — Encounter (HOSPITAL_COMMUNITY): Payer: Self-pay

## 2016-10-04 ENCOUNTER — Ambulatory Visit (HOSPITAL_COMMUNITY)
Admission: RE | Admit: 2016-10-04 | Discharge: 2016-10-04 | Disposition: A | Discharge status: Home / Self Care | Payer: Medicaid Other | Source: Ambulatory Visit | Attending: Cardiology | Admitting: Cardiology

## 2016-10-04 ENCOUNTER — Ambulatory Visit (HOSPITAL_COMMUNITY)
Admission: RE | Admit: 2016-10-04 | Discharge: 2016-10-04 | Disposition: A | Discharge status: Home / Self Care | Payer: Medicaid Other | Source: Ambulatory Visit | Attending: Family Medicine | Admitting: Family Medicine

## 2016-10-04 VITALS — BP 134/72 | HR 72 | Wt 267.8 lb

## 2016-10-04 DIAGNOSIS — I739 Peripheral vascular disease, unspecified: Secondary | ICD-10-CM

## 2016-10-04 DIAGNOSIS — I348 Other nonrheumatic mitral valve disorders: Secondary | ICD-10-CM | POA: Diagnosis not present

## 2016-10-04 DIAGNOSIS — I5042 Chronic combined systolic (congestive) and diastolic (congestive) heart failure: Secondary | ICD-10-CM | POA: Insufficient documentation

## 2016-10-04 LAB — PULMONARY FUNCTION TEST
DL/VA % pred: 80 %
DL/VA: 3.82 ml/min/mmHg/L
DLCO UNC % PRED: 54 %
DLCO unc: 18.97 ml/min/mmHg
FEF 25-75 PRE: 2.36 L/s
FEF 25-75 Post: 1.36 L/sec
FEF2575-%Change-Post: -42 %
FEF2575-%PRED-PRE: 73 %
FEF2575-%Pred-Post: 42 %
FEV1-%Change-Post: -13 %
FEV1-%PRED-POST: 59 %
FEV1-%Pred-Pre: 69 %
FEV1-PRE: 2.39 L
FEV1-Post: 2.06 L
FEV1FVC-%Change-Post: -2 %
FEV1FVC-%Pred-Pre: 100 %
FEV6-%CHANGE-POST: -12 %
FEV6-%Pred-Post: 61 %
FEV6-%Pred-Pre: 70 %
FEV6-POST: 2.61 L
FEV6-Pre: 3 L
FEV6FVC-%CHANGE-POST: 0 %
FEV6FVC-%PRED-POST: 103 %
FEV6FVC-%Pred-Pre: 102 %
FVC-%Change-Post: -11 %
FVC-%Pred-Post: 61 %
FVC-%Pred-Pre: 69 %
FVC-Post: 2.68 L
FVC-Pre: 3.02 L
PRE FEV1/FVC RATIO: 79 %
Post FEV1/FVC ratio: 77 %
Post FEV6/FVC ratio: 100 %
Pre FEV6/FVC Ratio: 99 %
RV % pred: 101 %
RV: 2.33 L
TLC % PRED: 75 %
TLC: 5.58 L

## 2016-10-04 LAB — BASIC METABOLIC PANEL
ANION GAP: 12 (ref 5–15)
BUN: 35 mg/dL — ABNORMAL HIGH (ref 6–20)
CALCIUM: 9.6 mg/dL (ref 8.9–10.3)
CO2: 25 mmol/L (ref 22–32)
Chloride: 101 mmol/L (ref 101–111)
Creatinine, Ser: 1.18 mg/dL (ref 0.61–1.24)
GLUCOSE: 99 mg/dL (ref 65–99)
Potassium: 4.2 mmol/L (ref 3.5–5.1)
Sodium: 138 mmol/L (ref 135–145)

## 2016-10-04 LAB — BRAIN NATRIURETIC PEPTIDE: B NATRIURETIC PEPTIDE 5: 10.2 pg/mL (ref 0.0–100.0)

## 2016-10-04 MED ORDER — SACUBITRIL-VALSARTAN 24-26 MG PO TABS: 1.0000 | ORAL_TABLET | Freq: Two times a day (BID) | ORAL | 3 refills | Status: DC

## 2016-10-04 MED ORDER — ALBUTEROL SULFATE (2.5 MG/3ML) 0.083% IN NEBU
2.5000 mg | INHALATION_SOLUTION | Freq: Once | RESPIRATORY_TRACT | Status: AC
  Administered 2016-10-04: 2.5 mg via RESPIRATORY_TRACT

## 2016-10-04 NOTE — Patient Instructions (Signed)
START taking Entresto 24-26 mg (1 Tablet) Two times Daily  STOP taking Lisinopril  CT Chest has been ordered for you.  Peripheral arterial dopplers has been ordered for you.  Labs in 10 days  Follow up in 6 weeks.

## 2016-10-06 NOTE — Progress Notes (Signed)
PCP: [facility] Cardiology: Dr. Aundra Dubin  56 yo with history of chronic systolic CHF and COPD/active smoking presents for CHF clinic evaluation.  He has a known nonischemic cardiomyopathy, echo in 1/17 with EF 20% and a severely dilated LV.  No coronary disease on 7/16 cardiac cath.  Repeat echo was done today, showing EF up to 40%.   He is trying stop smoking with Chantix, no cigarettes x 3 days. Legs fatigue/cramp after walking 50 yards.  He apparently has had abnormal ABIs at an outside facility.  He is mildly short of breath with heavy exertion.  No lightheadedness or palpitations.   No orthopnea/PND.  No chest pain.  He has OSA and is using CPAP.   Labs (12/17): K 4.3, creatinine 1.09 Labs (1/18): K 3.9, creatinine 1.13  Optivol: fluid index > threshold but impedance trending up again, no VT or atrial fibrillation.   PMH:  1. Chronic systolic CHF: Nonischemic cardiomyopathy.  Has Medtronic ICD.   - LHC (7/16) with nonobstructive disease.  - Echo (1/17) with EF 20%, severe LV dilation, mild to moderate MR.  - Echo (2/18): EF 40%, mild LV dilation.  2. COPD: Active smoker.  - PFTs (2/18) with minimal obstruction, mild restriction suggesting a parenchymal process, low DLCO.  3. Type II diabetes with peripheral neuropathy.  4. OSA on CPAP 5. HTN 6. Hyperlipidemia 7. PAD: ABIs abnormal at outside office.   SH: Lives in [place], works at [employer], smokes about 1 ppd.  Rare ETOH.  Remote substance abuse.   FH: Aunt with CHF, brother with ESRD.    ROS: All systems reviewed and negative except as per HPI.  Current Outpatient Prescriptions  Medication Sig Dispense Refill  . aspirin EC 81 MG tablet Take 1 tablet (81 mg total) by mouth daily. 30 tablet 0  . atorvastatin (LIPITOR) 20 MG tablet Take 1 tablet by mouth daily.    . canagliflozin (INVOKANA) 300 MG TABS tablet Take 300 mg by mouth daily before breakfast.    . carvedilol (COREG) 12.5 MG tablet TAKE ONE TABLET BY  MOUTH TWICE DAILY WITH A MEAL 180 tablet 2  . CORLANOR 5 MG TABS tablet TAKE ONE TABLET BY MOUTH TWICE DAILY WITH MEALS 60 tablet 10  . furosemide (LASIX) 40 MG tablet Take 40 mg by mouth 2 (two) times daily.    . isosorbide-hydrALAZINE (BIDIL) 20-37.5 MG tablet Take 1 tablet by mouth 2 (two) times daily.    . potassium chloride SA (K-DUR,KLOR-CON) 20 MEQ tablet Take 1 tablet (20 mEq total) by mouth daily. 30 tablet 3  . sildenafil (REVATIO) 20 MG tablet Take 2-4 tablets by mouth as needed.    . sitaGLIPtin-metformin (JANUMET) 50-1000 MG tablet Take 1 tablet by mouth 2 (two) times daily.    Marland Kitchen spironolactone (ALDACTONE) 25 MG tablet TAKE ONE TABLET BY MOUTH ONCE DAILY 90 tablet 2  . varenicline (CHANTIX PAK) 0.5 MG X 11 & 1 MG X 42 tablet Take by mouth 2 (two) times daily. Take one 0.5 mg tablet by mouth once daily for 3 days, then increase to one 0.5 mg tablet twice daily for 4 days, then increase to one 1 mg tablet twice daily.    . sacubitril-valsartan (ENTRESTO) 24-26 MG Take 1 tablet by mouth 2 (two) times daily. 60 tablet 3   No current facility-administered medications for this encounter.    BP 134/72 (BP Location: Left Arm, Patient Position: Sitting, Cuff Size: Normal)   Pulse 72  Wt 267 lb 12.8 oz (121.5 kg)   SpO2 97%   BMI 36.32 kg/m  General: NAD Neck: Thick, JVP difficult, no thyromegaly or thyroid nodule.  Lungs: Occasional rhonchi CV: Nondisplaced PMI.  Heart regular S1/S2, no S3/S4, no murmur.  Trace ankle edema.  No carotid bruit.  Unable to palpate pedal pulses.  Abdomen: Soft, nontender, no hepatosplenomegaly, no distention.  Skin: Intact without lesions or rashes.  Neurologic: Alert and oriented x 3.  Psych: Normal affect. Extremities: No clubbing or cyanosis.  HEENT: Normal.   Assessment/Plan: 1. Chronic systolic CHF: Nonischemic cardiomyopathy => ?due to viral myocarditis or prior substance abuse. .  Medtronic ICD.  Echo 1/17 with severely dilated LV and EF 20%,  EF up to 40% on echo done today (reviewed in the office).  NYHA class II-III symptom.  He does not look volume overloaded on exam.  By Optivol, fluid index > threshold but impedance is heading up.  I suspect some of his dyspnea is due to COPD/lung disease.  - Continue Lasix 40 mg bid, BMET/BNP today.  - Continue current Coreg, ivabradine and spironolactone.  - Stop lisinopril, after 36 hrs start Entresto 24/26 bid.  BMET in 10 days.  - Continue Bidil 1 tab bid (can only remember to take twice).    - Narrow QRS, no indication for CRT upgrade, and EF too high now for ICD.  2. Smoking/suspect COPD: He is using Chantix to try to quit smoking, no cigarettes x 3 days now.   PFTs done, somewhat concerning for interstitial lung disease rather than emphysema with restrictive PFTs and decreased DLCO.  - I will arrange for high resolution CT chest to assess for ILD.  3. OSA: Continue CPAP.  4. PAD: Suspected PAD based on symptoms and ABIs at outside facility. I will make referral to [facility] clinic and will get full peripheral arterial dopplers prior to the appointment.   Followup in 6 wks.   David Bright 10/06/2016

## 2016-10-07 ENCOUNTER — Other Ambulatory Visit (HOSPITAL_COMMUNITY): Payer: Self-pay | Admitting: *Deleted

## 2016-10-07 DIAGNOSIS — I5042 Chronic combined systolic (congestive) and diastolic (congestive) heart failure: Secondary | ICD-10-CM

## 2016-10-11 ENCOUNTER — Other Ambulatory Visit: Payer: Self-pay

## 2016-10-11 ENCOUNTER — Ambulatory Visit (HOSPITAL_COMMUNITY): Payer: Medicaid Other

## 2016-10-14 ENCOUNTER — Ambulatory Visit (HOSPITAL_COMMUNITY)
Admission: RE | Admit: 2016-10-14 | Discharge: 2016-10-14 | Disposition: A | Discharge status: Home / Self Care | Payer: Medicaid Other | Source: Ambulatory Visit | Attending: Cardiology | Admitting: Cardiology

## 2016-10-14 ENCOUNTER — Ambulatory Visit (HOSPITAL_COMMUNITY): Payer: Medicaid Other

## 2016-10-14 ENCOUNTER — Telehealth (HOSPITAL_COMMUNITY): Payer: Self-pay | Admitting: *Deleted

## 2016-10-14 ENCOUNTER — Other Ambulatory Visit (HOSPITAL_COMMUNITY): Payer: Self-pay | Admitting: Cardiology

## 2016-10-14 DIAGNOSIS — I5042 Chronic combined systolic (congestive) and diastolic (congestive) heart failure: Secondary | ICD-10-CM | POA: Insufficient documentation

## 2016-10-14 DIAGNOSIS — I739 Peripheral vascular disease, unspecified: Secondary | ICD-10-CM

## 2016-10-14 LAB — BASIC METABOLIC PANEL
Anion gap: 5 (ref 5–15)
BUN: 23 mg/dL — AB (ref 6–20)
CALCIUM: 8.8 mg/dL — AB (ref 8.9–10.3)
CHLORIDE: 108 mmol/L (ref 101–111)
CO2: 26 mmol/L (ref 22–32)
CREATININE: 1.19 mg/dL (ref 0.61–1.24)
GFR calc non Af Amer: >60 mL/min (ref >60)
Glucose, Bld: 185 mg/dL — ABNORMAL HIGH (ref 65–99)
Potassium: 4.2 mmol/L (ref 3.5–5.1)
SODIUM: 139 mmol/L (ref 135–145)

## 2016-10-14 NOTE — Telephone Encounter (Signed)
CT Chest denied through ins.  Medicaid does not allow a peer to peer they only allow appeals. Will submit appeal today. Called and informed patient and told him we would need to reschedule until I can get approval through ins.

## 2016-10-15 ENCOUNTER — Ambulatory Visit (INDEPENDENT_AMBULATORY_CARE_PROVIDER_SITE_OTHER): Payer: Medicaid Other

## 2016-10-15 DIAGNOSIS — Z95810 Presence of automatic (implantable) cardiac defibrillator: Secondary | ICD-10-CM

## 2016-10-15 DIAGNOSIS — I5042 Chronic combined systolic (congestive) and diastolic (congestive) heart failure: Secondary | ICD-10-CM

## 2016-10-15 NOTE — Progress Notes (Signed)
EPIC Encounter for ICM Monitoring  Patient Name: David Bright is a 56 y.o. male Date: 10/15/2016 Primary Care Physican: Luetta Nutting, DO Primary Cardiologist:Nelson/[facility]  Electrophysiologist: Caryl Comes Dry Weight: 274.6 lbs       Heart Failure questions reviewed, pt symptomatic with shortness of breath that woke him up last night and unable to sleep.  Also weight gain of 5 pounds in a week.  He has not been weighing daily and the weights are not recorded at the same time with same amount of clothes so unsure of accuracy of weight gain.   He says baseline is 265-269 and thinks he weighed 269 lb last week.    Thoracic impedance abnormal suggesting fluid accumulation since 09/26/2016.        Confirmed he takes prescribed dose of Furosemide40 mg 1 tablet bid and Potassium 20 mEq 1 tablet daily  LABS: 10/14/2016 Creatinine 1.19, BUN 23, Potassium 4.2, Sodium 139, EGFR >60 10/04/2016 Creatinine 1.18, BUN 35, Potassium 4.2, Sodium 138, EGFR >60 09/03/2016 Creatinine 1.13, BUN 28, Potassium 3.9, Sodium 138 07/19/2016 Creatinine 1.09, BUN 27, Potassium 4.3, Sodium 138 07/08/2016 Creatinine 1.22, BUN 27, Potassium 3.9, Sodium 142 06/12/2016 Creatinine 1.12, BUN 22, Potassium 4.0, Sodium 140 05/02/2016 Creatinine 1.33, BUN 34, Potassium 4.1, Sodium 136 03/20/2016 Creatinine 1.17, BUN 36, Potassium 4.7, Sodium 136 11/03/2015 Creatinine 1.03, BUN 17, Potassium 4.0, Sodium 140  Recommendations: Advised to take 1 additional Furosemide 40 mg tablet today and tomorrow and then returned to prescribed Furosemide dosage of 1 tablet bid.  Take additional Potassium 20 mEq 1 tablet when taking extra Furosemide today and tomorrow and return to prescribed dosage of Potassium 20 mEq 1 tablet daily.  He verbalized understanding.    Follow-up plan: ICM clinic phone appointment on 10/18/2016 to recheck fluid levels.  Office appointment with Dr [name] on 10/17/2016.  Patient had difficulty understanding  all of his upcoming appointments with the different physicians.  Explained each physicians speciality.     Copy of ICM check sent to Dr [name] and Dr Caryl Comes.   3 month ICM trend: 10/15/2016   1 Year ICM trend:      [name], RN 10/15/2016 8:19 AM

## 2016-10-17 ENCOUNTER — Encounter: Payer: Self-pay | Admitting: Cardiology

## 2016-10-17 ENCOUNTER — Ambulatory Visit (INDEPENDENT_AMBULATORY_CARE_PROVIDER_SITE_OTHER): Payer: Medicaid Other | Admitting: Cardiology

## 2016-10-17 VITALS — BP 130/70 | HR 78 | Ht 72.0 in | Wt 271.0 lb

## 2016-10-17 DIAGNOSIS — I5042 Chronic combined systolic (congestive) and diastolic (congestive) heart failure: Secondary | ICD-10-CM

## 2016-10-17 DIAGNOSIS — I739 Peripheral vascular disease, unspecified: Secondary | ICD-10-CM | POA: Diagnosis not present

## 2016-10-17 DIAGNOSIS — Z95810 Presence of automatic (implantable) cardiac defibrillator: Secondary | ICD-10-CM

## 2016-10-17 DIAGNOSIS — I428 Other cardiomyopathies: Secondary | ICD-10-CM | POA: Diagnosis not present

## 2016-10-17 DIAGNOSIS — I10 Essential (primary) hypertension: Secondary | ICD-10-CM

## 2016-10-17 DIAGNOSIS — I5043 Acute on chronic combined systolic (congestive) and diastolic (congestive) heart failure: Secondary | ICD-10-CM | POA: Diagnosis not present

## 2016-10-17 MED ORDER — FUROSEMIDE 80 MG PO TABS: 80.0000 mg | ORAL_TABLET | Freq: Two times a day (BID) | ORAL | 2 refills | Status: DC

## 2016-10-17 MED ORDER — POTASSIUM CHLORIDE CRYS ER 20 MEQ PO TBCR: 40.0000 meq | EXTENDED_RELEASE_TABLET | Freq: Every day | ORAL | 1 refills | Status: DC

## 2016-10-17 NOTE — Patient Instructions (Signed)
Medication Instructions:   INCREASE YOUR LASIX TO 80 MG TWICE DAILY---TAKE YOUR 1ST DOSE AT 8 AM AND YOUR 2ND DOSE AT 2 PM  INCREASE YOUR POTASSIUM CHLORIDE TO 40 mEq BY MOUTH DAILY     Follow-Up:  ADD TO DR NELSON'S MORNING ON ---10/28/16 AT 9:30 AM PER DR NELSON       If you need a refill on your cardiac medications before your next appointment, please call your pharmacy.

## 2016-10-17 NOTE — Progress Notes (Signed)
PCP: [facility] Cardiology: Dr. Aundra Dubin  56 yo with history of chronic systolic CHF and COPD/active smoking presents for CHF clinic evaluation.  He has a known nonischemic cardiomyopathy, echo in 1/17 with EF 20% and a severely dilated LV.  No coronary disease on 7/16 cardiac cath.  Repeat echo was done on 10/04/2016, showing EF up to 40%.   He is trying stop smoking with Chantix, no cigarettes x 3 days. Legs fatigue/cramp after walking 50 yards.  He apparently has had abnormal ABIs at an outside facility.  He is mildly short of breath with heavy exertion.  No lightheadedness or palpitations.   No orthopnea/PND.  No chest pain.  He has OSA and is using CPAP.   10/17/2016 - 3 weeks follow up, the patient states that he noticed some LE edema, 2 pillow orthopnea. Also his Optivol is suggestive of elevated incracardiac pressures. Denies palpitations or syncope, no chest pain, worsening DOE.   Labs (12/17): K 4.3, creatinine 1.09 Labs (1/18): K 3.9, creatinine 1.13  Optivol: fluid index > threshold but impedance trending up again, no VT or atrial fibrillation.   PMH:  1. Chronic systolic CHF: Nonischemic cardiomyopathy.  Has Medtronic ICD.   - LHC (7/16) with nonobstructive disease.  - Echo (1/17) with EF 20%, severe LV dilation, mild to moderate MR.  - Echo (2/18): EF 40%, mild LV dilation.  2. COPD: Active smoker.  - PFTs (2/18) with minimal obstruction, mild restriction suggesting a parenchymal process, low DLCO.  3. Type II diabetes with peripheral neuropathy.  4. OSA on CPAP 5. HTN 6. Hyperlipidemia 7. PAD: ABIs abnormal at outside office.   SH: Lives in [place], works at [employer], smokes about 1 ppd.  Rare ETOH.  Remote substance abuse.   FH: Aunt with CHF, brother with ESRD.    ROS: All systems reviewed and negative except as per HPI.  Current Outpatient Prescriptions  Medication Sig Dispense Refill  . aspirin EC 81 MG tablet Take 1 tablet (81 mg total) by mouth daily.  30 tablet 0  . atorvastatin (LIPITOR) 20 MG tablet Take 1 tablet by mouth daily.    . canagliflozin (INVOKANA) 300 MG TABS tablet Take 300 mg by mouth daily before breakfast.    . carvedilol (COREG) 12.5 MG tablet TAKE ONE TABLET BY MOUTH TWICE DAILY WITH A MEAL 180 tablet 2  . CORLANOR 5 MG TABS tablet TAKE ONE TABLET BY MOUTH TWICE DAILY WITH MEALS 60 tablet 10  . furosemide (LASIX) 40 MG tablet Take 40 mg by mouth 2 (two) times daily.    . isosorbide-hydrALAZINE (BIDIL) 20-37.5 MG tablet Take 1 tablet by mouth 2 (two) times daily.    . potassium chloride SA (K-DUR,KLOR-CON) 20 MEQ tablet Take 1 tablet (20 mEq total) by mouth daily. 30 tablet 3  . sacubitril-valsartan (ENTRESTO) 24-26 MG Take 1 tablet by mouth 2 (two) times daily. 60 tablet 3  . sildenafil (REVATIO) 20 MG tablet Take 2-4 tablets by mouth as needed.    . sitaGLIPtin-metformin (JANUMET) 50-1000 MG tablet Take 1 tablet by mouth 2 (two) times daily.    Marland Kitchen spironolactone (ALDACTONE) 25 MG tablet TAKE ONE TABLET BY MOUTH ONCE DAILY 90 tablet 2  . varenicline (CHANTIX PAK) 0.5 MG X 11 & 1 MG X 42 tablet Take by mouth 2 (two) times daily. Take one 0.5 mg tablet by mouth once daily for 3 days, then increase to one 0.5 mg tablet twice daily for 4 days, then  increase to one 1 mg tablet twice daily.     No current facility-administered medications for this visit.    BP 130/70   Pulse 78   Ht 6' (1.829 m)   Wt 271 lb (122.9 kg)   SpO2 98%   BMI 36.75 kg/m  General: NAD Neck: Thick, JVP difficult, no thyromegaly or thyroid nodule.  Lungs: B/L basilar crackles CV: Nondisplaced PMI.  Heart regular S1/S2, no S3/S4, no murmur.  Mild ankle edema.  No carotid bruit.  Unable to palpate pedal pulses.  Abdomen: Soft, nontender, no hepatosplenomegaly, no distention.  Skin: Intact without lesions or rashes.  Neurologic: Alert and oriented x 3.  Psych: Normal affect. Extremities: No clubbing or cyanosis.  HEENT: Normal.      Assessment/Plan:  1. Acute on Chronic systolic CHF: Nonischemic cardiomyopathy => ?due to viral myocarditis or prior substance abuse. .  Medtronic ICD.  Echo 1/17 with severely dilated LV and EF 20%, EF up to 40% on echo done today (reviewed in the office).  NYHA class II-III symptom.  He does not look volume overloaded on exam.  By Optivol, fluid index >  impedance is up. Also 11 lbs above his baseline, and 8 lbs up just in the last 2 weeks.   - Increase lasix to 80 mg po bid, increase KCl to 40 mEq daily - Follow up on 10/28/2016 with me, with repeat BMP and BNP at that time - Crea on 3/5 1.19, K 4.2 - Continue current Coreg, ivabradine and spironolactone.  - Continue Entresto 24/26 bid.  - Continue Bidil 1 tab bid (can only remember to take twice).    - Narrow QRS, no indication for CRT upgrade, and EF too high now for ICD.   2. Smoking/suspect COPD: He is using Chantix to try to quit smoking, no cigarettes x 3 days now.   PFTs done, somewhat concerning for interstitial lung disease rather than emphysema with restrictive PFTs and decreased DLCO.  - Dr Aundra Dubin to arrange for high resolution CT chest to assess for ILD.   3. OSA: Continue CPAP.   4. PAD: Suspected PAD based on symptoms and ABIs at outside facility. Dr Aundra Dubin referred to [facility] clinic and will get full peripheral arterial dopplers prior to the appointment.   Followup in 2 wks.   Ena Dawley 10/17/2016

## 2016-10-17 NOTE — Progress Notes (Signed)
ICM remote transmission rescheduled for 10/25/2016 to follow up on Furosemide dosage increase by Dr [name] today at office visit.

## 2016-10-22 ENCOUNTER — Inpatient Hospital Stay (HOSPITAL_COMMUNITY): Admission: RE | Admit: 2016-10-22 | Payer: Medicaid Other | Source: Ambulatory Visit

## 2016-10-22 ENCOUNTER — Other Ambulatory Visit (HOSPITAL_COMMUNITY): Payer: Self-pay | Admitting: Cardiology

## 2016-10-22 ENCOUNTER — Ambulatory Visit (HOSPITAL_COMMUNITY)
Admission: RE | Admit: 2016-10-22 | Discharge: 2016-10-22 | Disposition: A | Discharge status: Home / Self Care | Payer: Medicaid Other | Source: Ambulatory Visit | Attending: Cardiovascular Disease | Admitting: Cardiovascular Disease

## 2016-10-22 DIAGNOSIS — I700 Atherosclerosis of aorta: Secondary | ICD-10-CM | POA: Insufficient documentation

## 2016-10-22 DIAGNOSIS — I739 Peripheral vascular disease, unspecified: Secondary | ICD-10-CM | POA: Diagnosis present

## 2016-10-25 ENCOUNTER — Ambulatory Visit (INDEPENDENT_AMBULATORY_CARE_PROVIDER_SITE_OTHER): Payer: Medicaid Other

## 2016-10-25 DIAGNOSIS — I5042 Chronic combined systolic (congestive) and diastolic (congestive) heart failure: Secondary | ICD-10-CM | POA: Diagnosis not present

## 2016-10-25 DIAGNOSIS — Z95810 Presence of automatic (implantable) cardiac defibrillator: Secondary | ICD-10-CM

## 2016-10-25 NOTE — Progress Notes (Signed)
EPIC Encounter for ICM Monitoring  Patient Name: David Bright is a 56 y.o. male Date: 10/25/2016 Primary Care Physican: Luetta Nutting, DO Primary Cardiologist:[facility]  Electrophysiologist: Caryl Comes Dry Weight: unknown(Baseline 265-269 lbs)         Transmission reviewed  Thoracic impedance abnormal suggesting fluid accumulation from  09/26/2016 to 10/24/2016 but is back at baseline today.        Confirmed he takes prescribed dose of Furosemide40 mg 1 tablet bid and Potassium 20 mEq 1 tablet daily  LABS: 10/14/2016 Creatinine 1.19, BUN 23, Potassium 4.2, Sodium 139, EGFR >60 10/04/2016 Creatinine 1.18, BUN 35, Potassium 4.2, Sodium 138, EGFR >60 09/03/2016 Creatinine 1.13, BUN 28, Potassium 3.9, Sodium 138 07/19/2016 Creatinine 1.09, BUN 27, Potassium 4.3, Sodium 138 07/08/2016 Creatinine 1.22, BUN 27, Potassium 3.9, Sodium 142 06/12/2016 Creatinine 1.12, BUN 22, Potassium 4.0, Sodium 140 05/02/2016 Creatinine 1.33, BUN 34, Potassium 4.1, Sodium 136 03/20/2016 Creatinine 1.17, BUN 36, Potassium 4.7, Sodium 136 11/03/2015 Creatinine 1.03, BUN 17, Potassium 4.0, Sodium 140  Recommendations: None  Follow-up plan: ICM clinic phone appointment on 11/27/2016. Office appointment with Dr [name] 10/28/2016.  Copy of ICM check sent to primary cardiologist and device physician.   3 month ICM trend: 10/25/2016   1 Year ICM trend:      [name], RN 10/25/2016 8:01 AM

## 2016-10-28 ENCOUNTER — Encounter (INDEPENDENT_AMBULATORY_CARE_PROVIDER_SITE_OTHER): Payer: Self-pay

## 2016-10-28 ENCOUNTER — Ambulatory Visit (INDEPENDENT_AMBULATORY_CARE_PROVIDER_SITE_OTHER): Payer: Medicaid Other | Admitting: Cardiology

## 2016-10-28 ENCOUNTER — Encounter: Payer: Self-pay | Admitting: Cardiology

## 2016-10-28 VITALS — BP 112/64 | HR 59 | Ht 72.0 in | Wt 272.0 lb

## 2016-10-28 DIAGNOSIS — G4733 Obstructive sleep apnea (adult) (pediatric): Secondary | ICD-10-CM

## 2016-10-28 DIAGNOSIS — R0602 Shortness of breath: Secondary | ICD-10-CM

## 2016-10-28 DIAGNOSIS — I5043 Acute on chronic combined systolic (congestive) and diastolic (congestive) heart failure: Secondary | ICD-10-CM

## 2016-10-28 DIAGNOSIS — I428 Other cardiomyopathies: Secondary | ICD-10-CM | POA: Diagnosis not present

## 2016-10-28 DIAGNOSIS — Z95810 Presence of automatic (implantable) cardiac defibrillator: Secondary | ICD-10-CM

## 2016-10-28 NOTE — Patient Instructions (Signed)
Medication Instructions:  Your physician recommends that you continue on your current medications as directed. Please refer to the Current Medication list given to you today.   Labwork: BMET/pro BNP today  Testing/Procedures: none  Follow-Up: Your physician recommends that you schedule a follow-up appointment in: 2 months with Dr [name].        If you need a refill on your cardiac medications before your next appointment, please call your pharmacy.

## 2016-10-28 NOTE — Progress Notes (Signed)
PCP: [facility] Cardiology: Dr. Aundra Dubin  56 yo with history of chronic systolic CHF and COPD/active smoking presents for CHF clinic evaluation.  He has a known nonischemic cardiomyopathy, echo in 1/17 with EF 20% and a severely dilated LV.  No coronary disease on 7/16 cardiac cath.  Repeat echo was done on 10/04/2016, showing EF up to 40%.   He is trying stop smoking with Chantix, no cigarettes x 3 days. Legs fatigue/cramp after walking 50 yards.  He apparently has had abnormal ABIs at an outside facility.  He is mildly short of breath with heavy exertion.  No lightheadedness or palpitations.   No orthopnea/PND.  No chest pain.  He has OSA and is using CPAP.   10/17/2016 - 3 weeks follow up, the patient states that he noticed some LE edema, 2 pillow orthopnea. Also his Optivol is suggestive of elevated incracardiac pressures. Denies palpitations or syncope, no chest pain, worsening DOE.   10/28/2016 - 2 weeks follow-up, at the last visit patient had increased weight worsening lower extremity edema to flow orthopnea and findings on increased intracardiac pressures on Optivol reading. His Lasix was increased to 80 mg by mouth twice a day. Today he states that his lower extremity edema has resolved as well as two-pillow orthopnea and paroxysmal nocturnal dyspnea. He denies any cramping has been compliant with his meds.  Labs (12/17): K 4.3, creatinine 1.09 Labs (1/18): K 3.9, creatinine 1.13  Optivol: fluid index > threshold but impedance trending up again, no VT or atrial fibrillation.   PMH:  1. Chronic systolic CHF: Nonischemic cardiomyopathy.  Has Medtronic ICD.   - LHC (7/16) with nonobstructive disease.  - Echo (1/17) with EF 20%, severe LV dilation, mild to moderate MR.  - Echo (2/18): EF 40%, mild LV dilation.  2. COPD: Active smoker.  - PFTs (2/18) with minimal obstruction, mild restriction suggesting a parenchymal process, low DLCO.  3. Type II diabetes with peripheral neuropathy.  4.  OSA on CPAP 5. HTN 6. Hyperlipidemia 7. PAD: ABIs abnormal at outside office.   SH: Lives in [place], works at [employer], smokes about 1 ppd.  Rare ETOH.  Remote substance abuse.   FH: Aunt with CHF, brother with ESRD.    ROS: All systems reviewed and negative except as per HPI.  Current Outpatient Prescriptions  Medication Sig Dispense Refill  . aspirin EC 81 MG tablet Take 1 tablet (81 mg total) by mouth daily. 30 tablet 0  . atorvastatin (LIPITOR) 20 MG tablet Take 1 tablet by mouth daily.    . canagliflozin (INVOKANA) 300 MG TABS tablet Take 300 mg by mouth daily before breakfast.    . carvedilol (COREG) 12.5 MG tablet TAKE ONE TABLET BY MOUTH TWICE DAILY WITH A MEAL 180 tablet 2  . CORLANOR 5 MG TABS tablet TAKE ONE TABLET BY MOUTH TWICE DAILY WITH MEALS 60 tablet 10  . furosemide (LASIX) 80 MG tablet Take 1 tablet (80 mg total) by mouth 2 (two) times daily. 180 tablet 2  . isosorbide-hydrALAZINE (BIDIL) 20-37.5 MG tablet Take 1 tablet by mouth 2 (two) times daily.    . potassium chloride SA (K-DUR,KLOR-CON) 20 MEQ tablet Take 2 tablets (40 mEq total) by mouth daily. 180 tablet 1  . sacubitril-valsartan (ENTRESTO) 24-26 MG Take 1 tablet by mouth 2 (two) times daily. 60 tablet 3  . sildenafil (REVATIO) 20 MG tablet Take 2-4 tablets by mouth as needed.    . sitaGLIPtin-metformin (JANUMET) 50-1000 MG tablet  Take 1 tablet by mouth 2 (two) times daily.    Marland Kitchen spironolactone (ALDACTONE) 25 MG tablet TAKE ONE TABLET BY MOUTH ONCE DAILY 90 tablet 2  . varenicline (CHANTIX PAK) 0.5 MG X 11 & 1 MG X 42 tablet Take by mouth 2 (two) times daily. Take one 0.5 mg tablet by mouth once daily for 3 days, then increase to one 0.5 mg tablet twice daily for 4 days, then increase to one 1 mg tablet twice daily.     No current facility-administered medications for this visit.    BP 112/64   Pulse (!) 59   Ht 6' (1.829 m)   Wt 272 lb (123.4 kg)   SpO2 95%   BMI 36.89 kg/m  General:  NAD Neck: Thick, JVP difficult, no thyromegaly or thyroid nodule.  Lungs: CTA B/L. CV: Nondisplaced PMI.  Heart regular S1/S2, no S3/S4, no murmur.  No edema.  No carotid bruit.  Unable to palpate pedal pulses.  Abdomen: Soft, nontender, no hepatosplenomegaly, no distention.  Skin: Intact without lesions or rashes.  Neurologic: Alert and oriented x 3.  Psych: Normal affect. Extremities: No clubbing or cyanosis.  HEENT: Normal.     Assessment/Plan:  1. Acute on Chronic systolic CHF: Nonischemic cardiomyopathy => ?due to viral myocarditis or prior substance abuse. .  Medtronic ICD.  Echo 1/17 with severely dilated LV and EF 20%, EF up to 40% on echo done today (reviewed in the office).  NYHA class II-III symptom.   - Appears euvolemic today. - By Leanne Lovely, fluid index impedance back to normal. - Weight not significantly decreased but he is heavily dressed. - Continue lasix to 80 mg po bid and KCl to 40 mEq daily - Check BMP and BNP today.  - Continue current Coreg, ivabradine and spironolactone.  - Continue Entresto 24/26 bid.  - Continue Bidil 1 tab bid (can only remember to take twice).    - Narrow QRS, no indication for CRT upgrade, and EF too high now for ICD.   2. Smoking/suspect COPD: He is using Chantix to try to quit smoking, no cigarettes x 3 days now.   PFTs done, somewhat concerning for interstitial lung disease rather than emphysema with restrictive PFTs and decreased DLCO.  - Dr Aundra Dubin to arrange for high resolution CT chest to assess for ILD.   3. OSA: Continue CPAP.   4. PAD: Suspected PAD based on symptoms and ABIs at outside facility. Dr Aundra Dubin referred to [facility] clinic and will get full peripheral arterial dopplers prior to the appointment.   Followup in 2 months.  Ena Dawley 10/28/2016

## 2016-10-29 ENCOUNTER — Encounter: Payer: Self-pay | Admitting: Cardiovascular Disease

## 2016-10-29 ENCOUNTER — Ambulatory Visit (INDEPENDENT_AMBULATORY_CARE_PROVIDER_SITE_OTHER): Payer: Medicaid Other | Admitting: Cardiovascular Disease

## 2016-10-29 VITALS — BP 82/53 | HR 75 | Ht 72.0 in | Wt 273.8 lb

## 2016-10-29 DIAGNOSIS — I739 Peripheral vascular disease, unspecified: Secondary | ICD-10-CM | POA: Diagnosis not present

## 2016-10-29 LAB — BASIC METABOLIC PANEL
BUN/Creatinine Ratio: 28 — ABNORMAL HIGH (ref 9–20)
BUN: 41 mg/dL — ABNORMAL HIGH (ref 6–24)
CO2: 24 mmol/L (ref 18–29)
Calcium: 9.5 mg/dL (ref 8.7–10.2)
Chloride: 96 mmol/L (ref 96–106)
Creatinine, Ser: 1.48 mg/dL — ABNORMAL HIGH (ref 0.76–1.27)
GFR calc Af Amer: 60 mL/min/{1.73_m2} (ref >59)
GFR calc non Af Amer: 52 mL/min/{1.73_m2} — ABNORMAL LOW (ref >59)
Glucose: 147 mg/dL — ABNORMAL HIGH (ref 65–99)
Potassium: 4.3 mmol/L (ref 3.5–5.2)
Sodium: 138 mmol/L (ref 134–144)

## 2016-10-29 LAB — PRO B NATRIURETIC PEPTIDE: NT-Pro BNP: 61 pg/mL (ref 0–210)

## 2016-10-29 NOTE — Progress Notes (Signed)
10/29/2016 TELL ROZELLE   [birth year]  [id number]  Primary Physician Luetta Nutting, DO Primary Cardiologist: Lorretta Harp MD Renae Gloss  HPI:  [name] is a 56 year old mildly overweight single African-American male with no children currently does not work. 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 He is referred by Dr. [name] and Aundra Dubin for prophylaxis evaluation because of abnormal Doppler studies. He is a history of nonischemic cardiomyopathy status post Medtronic ICD implantation. The problems included 40 pack years of tobacco abuse currently on Chantix, treated hypertension, hyperlipidemia and diabetes. He has never had a heart attack or stroke. He does complain of symptoms compatible with diabetic peripheral neuropathy as well as arthritis in both knees. He has minimal symptoms of claudication. Recent Dopplers performed 10/22/16 revealed a right ABI 0.86 and a left ABI 0.78. He had 3 vessel bilaterally.   Current Outpatient Prescriptions  Medication Sig Dispense Refill  . aspirin EC 81 MG tablet Take 1 tablet (81 mg total) by mouth daily. 30 tablet 0  . atorvastatin (LIPITOR) 20 MG tablet Take 1 tablet by mouth daily.    . canagliflozin (INVOKANA) 300 MG TABS tablet Take 300 mg by mouth daily before breakfast.    . carvedilol (COREG) 12.5 MG tablet TAKE ONE TABLET BY MOUTH TWICE DAILY WITH A MEAL 180 tablet 2  . CORLANOR 5 MG TABS tablet TAKE ONE TABLET BY MOUTH TWICE DAILY WITH MEALS 60 tablet 10  . furosemide (LASIX) 80 MG tablet Take 1 tablet (80 mg total) by mouth 2 (two) times daily. 180 tablet 2  . isosorbide-hydrALAZINE (BIDIL) 20-37.5 MG tablet Take 1 tablet by mouth 2 (two) times daily.    . potassium chloride SA (K-DUR,KLOR-CON) 20 MEQ tablet Take 2 tablets (40 mEq total) by mouth daily. 180 tablet 1  . sacubitril-valsartan (ENTRESTO) 24-26 MG Take 1 tablet by mouth 2 (two) times daily. 60 tablet 3  . sildenafil (REVATIO) 20 MG tablet Take 2-4  tablets by mouth as needed.    . sitaGLIPtin-metformin (JANUMET) 50-1000 MG tablet Take 1 tablet by mouth 2 (two) times daily.    Marland Kitchen spironolactone (ALDACTONE) 25 MG tablet TAKE ONE TABLET BY MOUTH ONCE DAILY 90 tablet 2  . varenicline (CHANTIX PAK) 0.5 MG X 11 & 1 MG X 42 tablet Take by mouth 2 (two) times daily. Take one 0.5 mg tablet by mouth once daily for 3 days, then increase to one 0.5 mg tablet twice daily for 4 days, then increase to one 1 mg tablet twice daily.     No current facility-administered medications for this visit.     No Known Allergies  Social History   Social History  . Marital status: Single    Spouse name: N/A  . Number of children: N/A  . Years of education: N/A   Occupational History  . 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 Smoking status: Current Every Day Smoker    Packs/day: 1.00    Years: 33.00    Types: Cigarettes  . Smokeless tobacco: Never Used  . Alcohol use No     Comment: QUIT IN 2000  . Drug use: No     Comment: quit 12 years ago  . Sexual activity: No   Other Topics Concern  . Not on file   Social History Narrative   Lives in [place], [place] with housemate.      Review of Systems:  General: negative for chills, fever, night sweats or weight changes.  Cardiovascular: negative for chest pain, dyspnea on exertion, edema, orthopnea, palpitations, paroxysmal nocturnal dyspnea or shortness of breath Dermatological: negative for rash Respiratory: negative for cough or wheezing Urologic: negative for hematuria Abdominal: negative for nausea, vomiting, diarrhea, bright red blood per rectum, melena, or hematemesis Neurologic: negative for visual changes, syncope, or dizziness All other systems reviewed and are otherwise negative except as noted above.    Blood pressure (!) 82/53, pulse 75, height 6' (1.829 m), weight 273 lb 12.8 oz (124.2 kg), SpO2 97 %.  General appearance: alert and no  distress Neck: no adenopathy, no carotid bruit, no JVD, supple, symmetrical, trachea midline and thyroid not enlarged, symmetric, no tenderness/mass/nodules Lungs: clear to auscultation bilaterally Heart: regular rate and rhythm, S1, S2 normal, no murmur, click, rub or gallop Extremities: extremities normal, atraumatic, no cyanosis or edema  EKG not performed today  ASSESSMENT AND PLAN:   Peripheral arterial disease [facility]) Mr. Leverich was referred by Drs. Rojelio Brenner for peripheral vascular evaluation. He has a history of nonischemic cardiomyopathy optimal medical therapy, ICD implantation, treated hypertension, diabetes and hyperlipidemia as well as 40 pack years of tobacco abuse. He complains of symptoms compatible with diabetic peripheral neuropathy as well as arthritis of both knees. He really denies significant claudication symptoms. Lower extremity arterial Doppler studies performed in our office 10/22/16 revealed a right ABI of 0.86 and left upper 70. He hadn't scattered disease but nothing that appeared to be significantly obstructive. He does have palpable pedal pulses. At this point, I cannot attribute his symptoms to peripheral vascular occlusive disease. We will continue to follow him noninvasively.      Lorretta Harp MD FACP,FACC,FAHA, [facility] 10/29/2016 11:54 AM

## 2016-10-29 NOTE — Assessment & Plan Note (Signed)
David Bright was referred by Drs. Rojelio Brenner for peripheral vascular evaluation. He has a history of nonischemic cardiomyopathy optimal medical therapy, ICD implantation, treated hypertension, diabetes and hyperlipidemia as well as 40 pack years of tobacco abuse. He complains of symptoms compatible with diabetic peripheral neuropathy as well as arthritis of both knees. He really denies significant claudication symptoms. Lower extremity arterial Doppler studies performed in our office 10/22/16 revealed a right ABI of 0.86 and left upper 70. He hadn't scattered disease but nothing that appeared to be significantly obstructive. He does have palpable pedal pulses. At this point, I cannot attribute his symptoms to peripheral vascular occlusive disease. We will continue to follow him noninvasively.

## 2016-10-29 NOTE — Patient Instructions (Signed)
Your physician recommends that you schedule a follow-up appointment as needed with Dr. Gwenlyn Found.  We will continue to get LEA Dopplers yearly. Your physician has requested that you have a lower extremity arterial duplex. During this test, ultrasound is used to evaluate arterial blood flow in the legs. Allow one hour for this exam. There are no restrictions or special instructions.  Your physician has requested that you have an ankle brachial index (ABI). During this test an ultrasound and blood pressure cuff are used to evaluate the arteries that supply the arms and legs with blood. Allow thirty minutes for this exam. There are no restrictions or special instructions.

## 2016-10-30 ENCOUNTER — Other Ambulatory Visit: Payer: Self-pay | Admitting: Cardiology

## 2016-10-31 ENCOUNTER — Telehealth: Payer: Self-pay | Admitting: Cardiology

## 2016-10-31 MED ORDER — FUROSEMIDE 40 MG PO TABS: 40.0000 mg | ORAL_TABLET | Freq: Two times a day (BID) | ORAL | 1 refills | Status: DC

## 2016-10-31 MED ORDER — POTASSIUM CHLORIDE CRYS ER 20 MEQ PO TBCR: 20.0000 meq | EXTENDED_RELEASE_TABLET | Freq: Every day | ORAL | 3 refills | Status: DC

## 2016-10-31 NOTE — Telephone Encounter (Signed)
-----   Message from Dorothy Spark, MD sent at 10/29/2016  5:16 PM EDT ----- BNP is normal consistent with no CHF, however creatinine is elevated. Please advise him to decrease Lasix from 80 mg by mouth twice a day to 40 mg by mouth twice a day and potassium chloride from 40-20 mEq daily.

## 2016-10-31 NOTE — Telephone Encounter (Signed)
Follow Up:   Returning your call from 10-29-16.concerning his lab results.

## 2016-10-31 NOTE — Telephone Encounter (Signed)
Notified the pt that per Dr [name], his BNP is normal, consistent with no CHF, however his creatinine is elevated.  Informed the pt that per Dr [name], she recommends that we decrease his lasix from 80 mg po bid, to lasix 40 mg po bid, and decrease K-DUR from 40 mEq po daily, to K-dur 20 mEq po daily.  Confirmed the pharmacy of choice with the pt.  Pt verbalized understanding and agrees with this plan.

## 2016-11-08 ENCOUNTER — Telehealth (HOSPITAL_COMMUNITY): Payer: Self-pay | Admitting: Pharmacist

## 2016-11-08 ENCOUNTER — Encounter (HOSPITAL_COMMUNITY): Payer: Medicaid Other

## 2016-11-08 NOTE — Telephone Encounter (Signed)
Entresto PA approved by [place] Medicaid through 11/02/17.   Ruta Hinds. Velva Harman, PharmD, BCPS, CPP Clinical Pharmacist Pager: [phone] Phone: [phone] 11/08/2016 10:22 AM

## 2016-11-22 ENCOUNTER — Ambulatory Visit (HOSPITAL_COMMUNITY)
Admission: RE | Admit: 2016-11-22 | Discharge: 2016-11-22 | Disposition: A | Discharge status: Home / Self Care | Payer: Medicaid Other | Source: Ambulatory Visit | Attending: Cardiology | Admitting: Cardiology

## 2016-11-22 ENCOUNTER — Encounter (HOSPITAL_COMMUNITY): Payer: Self-pay

## 2016-11-22 VITALS — BP 112/56 | HR 75 | Wt 277.5 lb

## 2016-11-22 DIAGNOSIS — Z8249 Family history of ischemic heart disease and other diseases of the circulatory system: Secondary | ICD-10-CM | POA: Diagnosis not present

## 2016-11-22 DIAGNOSIS — J449 Chronic obstructive pulmonary disease, unspecified: Secondary | ICD-10-CM | POA: Diagnosis not present

## 2016-11-22 DIAGNOSIS — J849 Interstitial pulmonary disease, unspecified: Secondary | ICD-10-CM

## 2016-11-22 DIAGNOSIS — I5042 Chronic combined systolic (congestive) and diastolic (congestive) heart failure: Secondary | ICD-10-CM | POA: Diagnosis not present

## 2016-11-22 DIAGNOSIS — I110 Hypertensive heart disease with heart failure: Secondary | ICD-10-CM | POA: Diagnosis present

## 2016-11-22 DIAGNOSIS — E785 Hyperlipidemia, unspecified: Secondary | ICD-10-CM | POA: Insufficient documentation

## 2016-11-22 DIAGNOSIS — Z7984 Long term (current) use of oral hypoglycemic drugs: Secondary | ICD-10-CM | POA: Diagnosis not present

## 2016-11-22 DIAGNOSIS — F17210 Nicotine dependence, cigarettes, uncomplicated: Secondary | ICD-10-CM | POA: Insufficient documentation

## 2016-11-22 DIAGNOSIS — I5022 Chronic systolic (congestive) heart failure: Secondary | ICD-10-CM | POA: Insufficient documentation

## 2016-11-22 DIAGNOSIS — I739 Peripheral vascular disease, unspecified: Secondary | ICD-10-CM | POA: Diagnosis not present

## 2016-11-22 DIAGNOSIS — E1151 Type 2 diabetes mellitus with diabetic peripheral angiopathy without gangrene: Secondary | ICD-10-CM | POA: Diagnosis not present

## 2016-11-22 DIAGNOSIS — F1910 Other psychoactive substance abuse, uncomplicated: Secondary | ICD-10-CM

## 2016-11-22 DIAGNOSIS — G4733 Obstructive sleep apnea (adult) (pediatric): Secondary | ICD-10-CM | POA: Diagnosis not present

## 2016-11-22 DIAGNOSIS — Z7982 Long term (current) use of aspirin: Secondary | ICD-10-CM | POA: Diagnosis not present

## 2016-11-22 LAB — BASIC METABOLIC PANEL
ANION GAP: 9 (ref 5–15)
BUN: 19 mg/dL (ref 6–20)
CHLORIDE: 104 mmol/L (ref 101–111)
CO2: 23 mmol/L (ref 22–32)
Calcium: 9.2 mg/dL (ref 8.9–10.3)
Creatinine, Ser: 1.03 mg/dL (ref 0.61–1.24)
GFR calc non Af Amer: >60 mL/min (ref >60)
Glucose, Bld: 157 mg/dL — ABNORMAL HIGH (ref 65–99)
POTASSIUM: 4.6 mmol/L (ref 3.5–5.1)
SODIUM: 136 mmol/L (ref 135–145)

## 2016-11-22 LAB — LIPID PANEL
CHOL/HDL RATIO: 3.4 ratio
CHOLESTEROL: 132 mg/dL (ref 0–200)
HDL: 39 mg/dL — ABNORMAL LOW (ref >40)
LDL Cholesterol: 71 mg/dL (ref 0–99)
Triglycerides: 112 mg/dL (ref <150)
VLDL: 22 mg/dL (ref 0–40)

## 2016-11-22 LAB — BRAIN NATRIURETIC PEPTIDE: B NATRIURETIC PEPTIDE 5: 34.3 pg/mL (ref 0.0–100.0)

## 2016-11-22 MED ORDER — SACUBITRIL-VALSARTAN 49-51 MG PO TABS: 1.0000 | ORAL_TABLET | Freq: Two times a day (BID) | ORAL | 6 refills | Status: DC

## 2016-11-22 NOTE — Patient Instructions (Signed)
INCREASE Entresto to 49/51 mg twice daily. Can "double up" on current 24/26 mg tabelts: Take 2 tabs twice daily. New Rx has been sent for 49/51 mg tablets: Take 1 tablet twice daily.  You have been scheduled for a high resolution CT of your chest at [facility] Radiology will contact you to make this appointment.  Routine lab work today. Will notify you of abnormal results, otherwise no news is good news!  Return in 1-2 weeks to repeat labs.  Follow up 1 month with Dr. Aundra Dubin.  Do the following things EVERYDAY: 1) Weigh yourself in the morning before breakfast. Write it down and keep it in a log. 2) Take your medicines as prescribed 3) Eat low salt foods-Limit salt (sodium) to 2000 mg per day.  4) Stay as active as you can everyday 5) Limit all fluids for the day to less than 2 liters

## 2016-11-24 NOTE — Progress Notes (Signed)
PCP: [facility] Cardiology: Dr. Aundra Dubin  56 yo with history of chronic systolic CHF and COPD/active smoking presents for CHF clinic evaluation.  He has a known nonischemic cardiomyopathy, echo in 1/17 with EF 20% and a severely dilated LV.  No coronary disease on 7/16 cardiac cath.  Repeat echo was 2/18, showing EF up to 40%.   He is trying stop smoking with Chantix, down to 2-2.5 cigs/day. Still with leg fatigue/cramping after walking 100 yards.  He had significant PAD by peripheral arterial dopplers, saw Dr. Gwenlyn Found with plan of medical management for now.   Weight is up about 10 lbs today.  He says that he has been eating more.  He has mild dyspnea after walking about 100 feet (but does not have to stop) or walking up stairs.  No chest pain.  No lightheadedness or palpitations.   No orthopnea/PND.  No chest pain.  He has OSA and is using CPAP.  Lasix has been cut back due to elevated creatinine.   Labs (12/17): K 4.3, creatinine 1.09 Labs (1/18): K 3.9, creatinine 1.13 Labs (3/18): K 4.3, creatinine 1.48, NT-proBNP 61  Optivol: fluid index > threshold, down-trending impedance, no VT or atrial fibrillation.   PMH:  1. Chronic systolic CHF: Nonischemic cardiomyopathy.  Has Medtronic ICD.   - LHC (7/16) with nonobstructive disease.  - Echo (1/17) with EF 20%, severe LV dilation, mild to moderate MR.  - Echo (2/18): EF 40%, mild LV dilation.  2. COPD: Active smoker.  - PFTs (2/18) with minimal obstruction, mild restriction suggesting a parenchymal process, low DLCO.  3. Type II diabetes with peripheral neuropathy.  4. OSA on CPAP 5. HTN 6. Hyperlipidemia 7. PAD: ABIs abnormal at outside office.  - Peripheral arterial dopplers (3/18) with > 50% left CIA and bilateral EIA stenosis, 50-74% mid LSFA stenosis.   SH: Lives in [place], works at [employer], smokes about 1 ppd.  Rare ETOH.  Remote substance abuse.   FH: Aunt with CHF, brother with ESRD.    ROS: All systems reviewed  and negative except as per HPI.  Current Outpatient Prescriptions  Medication Sig Dispense Refill  . aspirin EC 81 MG tablet Take 1 tablet (81 mg total) by mouth daily. 30 tablet 0  . atorvastatin (LIPITOR) 20 MG tablet Take 1 tablet by mouth daily.    . canagliflozin (INVOKANA) 300 MG TABS tablet Take 300 mg by mouth daily before breakfast.    . carvedilol (COREG) 12.5 MG tablet TAKE ONE TABLET BY MOUTH TWICE DAILY WITH A MEAL 180 tablet 2  . CORLANOR 5 MG TABS tablet TAKE ONE TABLET BY MOUTH TWICE DAILY WITH MEALS 60 tablet 10  . furosemide (LASIX) 40 MG tablet Take 40 mg by mouth daily.    . isosorbide-hydrALAZINE (BIDIL) 20-37.5 MG tablet Take 1 tablet by mouth 2 (two) times daily.    . potassium chloride SA (K-DUR,KLOR-CON) 20 MEQ tablet Take 1 tablet (20 mEq total) by mouth daily. 90 tablet 3  . sitaGLIPtin-metformin (JANUMET) 50-1000 MG tablet Take 1 tablet by mouth 2 (two) times daily.    Marland Kitchen spironolactone (ALDACTONE) 25 MG tablet TAKE ONE TABLET BY MOUTH ONCE DAILY 90 tablet 2  . varenicline (CHANTIX PAK) 0.5 MG X 11 & 1 MG X 42 tablet Take by mouth 2 (two) times daily. Take one 0.5 mg tablet by mouth once daily for 3 days, then increase to one 0.5 mg tablet twice daily for 4 days, then increase to  one 1 mg tablet twice daily.    . sacubitril-valsartan (ENTRESTO) 49-51 MG Take 1 tablet by mouth 2 (two) times daily. 60 tablet 6   No current facility-administered medications for this encounter.    BP (!) 112/56   Pulse 75   Wt 277 lb 8 oz (125.9 kg)   SpO2 100%   BMI 37.64 kg/m  General: NAD Neck: Thick, JVP does not appear elevated, no thyromegaly or thyroid nodule.  Lungs: Mildly decreased breath sounds bilaterally.  CV: Nondisplaced PMI.  Heart regular S1/S2, no S3/S4, no murmur.  No edema.  No carotid bruit.  Unable to palpate pedal pulses.  Abdomen: Soft, nontender, no hepatosplenomegaly, no distention.  Skin: Intact without lesions or rashes.  Neurologic: Alert and oriented  x 3.  Psych: Normal affect. Extremities: No clubbing or cyanosis.  HEENT: Normal.   Assessment/Plan: 1. Chronic systolic CHF: Nonischemic cardiomyopathy => ?due to viral myocarditis or prior substance abuse.  Medtronic ICD.  Echo 1/17 with severely dilated LV and EF 20%, EF up to 40% on echo 2/18.  NYHA class II-III symptoms.  He does not look volume overloaded on exam, but Optivol suggests fluid accumulation.  Lasix recently cut back due to rise in creatinine. - Continue Lasix 40 mg daily for now.   - Increase Entresto to 49/51 bid, this should give additional diuresis.  BMET today, repeat in 10 days.  - Continue current Coreg, ivabradine and spironolactone.  - Continue Bidil 1 tab bid (can only remember to take twice).    - Narrow QRS, no indication for CRT upgrade, and EF too high now for ICD.  - Followup in 1 month to reassess volume status.  2. Smoking/suspect COPD: He is using Chantix to try to quit smoking, cutting back.   PFTs done, somewhat concerning for interstitial lung disease rather than emphysema with restrictive PFTs and decreased DLCO.  - I will try again to arrange for high resolution CT chest to assess for ILD.  3. OSA: Continue CPAP.  4. PAD: Significant disease on peripheral dopplers, stable claudication after about 100 yards.  He saw Dr. Gwenlyn Found, medical management recommended.  I encouraged him to try to walk at least a short distance through the pain.  - Continue ASA 81 and statin.  Check lipids today.   Followup in 1 month.   Loralie Champagne 11/24/2016

## 2016-11-25 ENCOUNTER — Encounter (HOSPITAL_COMMUNITY): Payer: Self-pay | Admitting: *Deleted

## 2016-11-28 ENCOUNTER — Telehealth: Payer: Self-pay

## 2016-11-28 ENCOUNTER — Ambulatory Visit (INDEPENDENT_AMBULATORY_CARE_PROVIDER_SITE_OTHER): Payer: Medicaid Other

## 2016-11-28 DIAGNOSIS — Z95810 Presence of automatic (implantable) cardiac defibrillator: Secondary | ICD-10-CM

## 2016-11-28 DIAGNOSIS — I5042 Chronic combined systolic (congestive) and diastolic (congestive) heart failure: Secondary | ICD-10-CM | POA: Diagnosis not present

## 2016-11-28 NOTE — Progress Notes (Signed)
EPIC Encounter for ICM Monitoring  Patient Name: David Bright is a 56 y.o. male Date: 11/28/2016 Primary Care Physican: Luetta Nutting, DO Primary Cardiologist:[facility]  Electrophysiologist: Caryl Comes Dry Weight: 281 lbs (Baseline 265-269 lbs)      Heart Failure questions reviewed, pt symptomatic with weight gain and up to 281 lbs and had some stomach bloating because he reported eating more since trying to quit smoking.     Thoracic impedance abnormal suggesting fluid accumulation since 10/31/2016 but impedance has been close to baseline on 11/19/2016.        Prescribed dose of Furosemide40 mg 1 tablet daily (decreased due to elevated creatinine on 3/19).  Potassium 20 mEq 1 tablet daily  LABS: 11/22/2016 Creatinine 1.03, BUN 19, Potassium 4.6, Sodium 136, EGFR >60 10/28/2016 Creatinine 1.48, BUN 48, Potassium 4.3, Sodium 138, EGFR 52-60 10/14/2016 Creatinine 1.19, BUN 23, Potassium 4.2, Sodium 139, EGFR >60 10/04/2016 Creatinine 1.18, BUN 35, Potassium 4.2, Sodium 138, EGFR >60 09/03/2016 Creatinine 1.13, BUN 28, Potassium 3.9, Sodium 138 07/19/2016 Creatinine 1.09, BUN 27, Potassium 4.3, Sodium 138 07/08/2016 Creatinine 1.22, BUN 27, Potassium 3.9, Sodium 142 06/12/2016 Creatinine 1.12, BUN 22, Potassium 4.0, Sodium 140 05/02/2016 Creatinine 1.33, BUN 34, Potassium 4.1, Sodium 136 03/20/2016 Creatinine 1.17, BUN 36, Potassium 4.7, Sodium 136 11/03/2015 Creatinine 1.03, BUN 17, Potassium 4.0, Sodium 140  Recommendations:  He reported he has not consistently taken Furosemide 40 mg 1 tablet daily as prescribed.  Some days he has not taken a tablet and other days he takes 2 tablets.  Explained Dr Aundra Dubin wants him to be consistent and take 1 tablet every day as prescribed and if he has fluid symptoms to call and let the office or myself know about it.  Explained the dosage was decreased in March due to blood work showed decrease in kidney function.  Advised if he is eating  more foods that are high in sodium since he is trying to quit smoking then he will gain more fluid weight.  Advised to replace high sodium foods with fruit and vegetables and he could try eating sugar free hard candy when he wants a cigarette.    Follow-up plan: ICM clinic phone appointment on 12/03/2016 to recheck fluid levels.  Office appointment scheduled on 12/27/2016 with HF clinic.  Copy of ICM check sent to Dr Aundra Dubin, Dr [name] and Dr Caryl Comes for review and any recommendations.    3 month ICM trend: 11/28/2016   1 Year ICM trend:      [name], RN 11/28/2016 9:14 AM

## 2016-11-28 NOTE — Progress Notes (Signed)
He can take Lasix 40 qam/20 qpm for now, get BMET next week.

## 2016-11-28 NOTE — Telephone Encounter (Signed)
Remote ICM transmission received.  Attempted patient call and left detailed message to return call  

## 2016-11-29 MED ORDER — FUROSEMIDE 40 MG PO TABS: ORAL_TABLET | ORAL | 3 refills | Status: DC

## 2016-11-29 MED ORDER — FUROSEMIDE 40 MG PO TABS: 40.0000 mg | ORAL_TABLET | Freq: Every day | ORAL | 3 refills | Status: DC

## 2016-11-29 NOTE — Progress Notes (Signed)
Call to patient and provided Dr Claris Gladden recommendation to increase Lasix to 40 mg every AM and 20 mg every PM.   Advised he needs to have BMET drawn next week.   He said he is already scheduled to have a BMET drawn on Monday, 4/23, that was set up at last HF appointment (11/22/2016).  Advised if BMET needs to be changed from Monday to later in the week, I would call him back.   He said he cannot afford to keep coming back.  He reported he has enough Lasix supply on hand and does not need a new script at this time.

## 2016-11-29 NOTE — Progress Notes (Signed)
Take lasix 40 qam/20 qpm x 3 days, then take 40 mg daily regularly after that.

## 2016-11-29 NOTE — Progress Notes (Signed)
Call to patient and advised Dr Aundra Dubin changed the Furosemide order to 40 mg in AM and 20 mg in PM x 3 days and after 3rd day return to 40 mg daily.  He verbalized understanding of the change.  Advised to keep his blood work appointment for Monday.

## 2016-12-02 ENCOUNTER — Ambulatory Visit (HOSPITAL_COMMUNITY)
Admission: RE | Admit: 2016-12-02 | Discharge: 2016-12-02 | Disposition: A | Discharge status: Home / Self Care | Payer: Medicaid Other | Source: Ambulatory Visit | Attending: Internal Medicine | Admitting: Internal Medicine

## 2016-12-02 DIAGNOSIS — I5042 Chronic combined systolic (congestive) and diastolic (congestive) heart failure: Secondary | ICD-10-CM | POA: Diagnosis not present

## 2016-12-02 LAB — BASIC METABOLIC PANEL
ANION GAP: 9 (ref 5–15)
BUN: 31 mg/dL — ABNORMAL HIGH (ref 6–20)
CHLORIDE: 102 mmol/L (ref 101–111)
CO2: 27 mmol/L (ref 22–32)
Calcium: 9.3 mg/dL (ref 8.9–10.3)
Creatinine, Ser: 1.23 mg/dL (ref 0.61–1.24)
GFR calc Af Amer: >60 mL/min (ref >60)
GFR calc non Af Amer: >60 mL/min (ref >60)
Glucose, Bld: 133 mg/dL — ABNORMAL HIGH (ref 65–99)
Potassium: 4.5 mmol/L (ref 3.5–5.1)
Sodium: 138 mmol/L (ref 135–145)

## 2016-12-03 ENCOUNTER — Ambulatory Visit (INDEPENDENT_AMBULATORY_CARE_PROVIDER_SITE_OTHER): Payer: Self-pay

## 2016-12-03 DIAGNOSIS — I5042 Chronic combined systolic (congestive) and diastolic (congestive) heart failure: Secondary | ICD-10-CM

## 2016-12-03 DIAGNOSIS — Z95810 Presence of automatic (implantable) cardiac defibrillator: Secondary | ICD-10-CM

## 2016-12-03 MED ORDER — FUROSEMIDE 40 MG PO TABS: 40.0000 mg | ORAL_TABLET | Freq: Two times a day (BID) | ORAL | 3 refills | Status: DC

## 2016-12-03 NOTE — Progress Notes (Signed)
Increase lasix to 40 mg po bid with BMET in 1 week.

## 2016-12-03 NOTE — Progress Notes (Signed)
EPIC Encounter for ICM Monitoring  Patient Name: David Bright is a 56 y.o. male Date: 12/03/2016 Primary Care Physican: Luetta Nutting, DO Primary Cardiologist:[facility]  Electrophysiologist: Caryl Comes Dry Weight: 280 lbs (Baseline 265-269 lbs)                                                      Heart Failure questions reviewed, pt asymptomatic    Thoracic impedance shows slight change and continues to be abnormal suggesting fluid accumulation even after taking Furosemide 40 mg in AM and 20 mg in PM x 3 days (4/19 to 4/21).        Prescribed dose of Furosemide40 mg 1 tablet daily.  Potassium 20 mEq 1 tablet daily  LABS: 11/22/2016 Creatinine 1.03, BUN 19, Potassium 4.6, Sodium 136, EGFR >60 10/28/2016 Creatinine 1.48, BUN 48, Potassium 4.3, Sodium 138, EGFR 52-60 10/14/2016 Creatinine 1.19, BUN 23, Potassium 4.2, Sodium 139, EGFR >60 10/04/2016 Creatinine 1.18, BUN 35, Potassium 4.2, Sodium 138, EGFR >60 09/03/2016 Creatinine 1.13, BUN 28, Potassium 3.9, Sodium 138 07/19/2016 Creatinine 1.09, BUN 27, Potassium 4.3, Sodium 138 07/08/2016 Creatinine 1.22, BUN 27, Potassium 3.9, Sodium 142 06/12/2016 Creatinine 1.12, BUN 22, Potassium 4.0, Sodium 140 05/02/2016 Creatinine 1.33, BUN 34, Potassium 4.1, Sodium 136 03/20/2016 Creatinine 1.17, BUN 36, Potassium 4.7, Sodium 136 11/03/2015 Creatinine 1.03, BUN 17, Potassium 4.0, Sodium 140  Recommendations: No changes.  Advised to limit salt intake to 2000 mg daily..  Follow-up plan: ICM clinic phone appointment on 01/13/2017.  Office appointment scheduled on 12/27/2016 with HF clinic.  Copy of ICM check sent to Dr Aundra Dubin and Dr Caryl Comes.   3 month ICM trend: 12/03/2016   1 Year ICM trend:      [name], RN 12/03/2016 9:28 AM

## 2016-12-03 NOTE — Progress Notes (Signed)
Call to patient and advised Dr Aundra Dubin ordered to increase lasix to 40 mg po bid with BMET in 1 week.   Patient verbalized understanding.    He cannot financially afford to get a BMET drawn in the next 2 weeks and will inform Dr Aundra Dubin.  Advised I would enter the order in case he may be able to get it drawn at a later date.   Next ICM remote transmission scheduled for 12/10/2016 to recheck fluid levels

## 2016-12-10 ENCOUNTER — Telehealth: Payer: Self-pay | Admitting: Cardiology

## 2016-12-10 ENCOUNTER — Ambulatory Visit (INDEPENDENT_AMBULATORY_CARE_PROVIDER_SITE_OTHER): Payer: Self-pay

## 2016-12-10 DIAGNOSIS — Z95810 Presence of automatic (implantable) cardiac defibrillator: Secondary | ICD-10-CM

## 2016-12-10 DIAGNOSIS — I5042 Chronic combined systolic (congestive) and diastolic (congestive) heart failure: Secondary | ICD-10-CM

## 2016-12-10 NOTE — Telephone Encounter (Signed)
Spoke with pt and reminded pt of remote transmission that is due today. Pt verbalized understanding.   

## 2016-12-12 ENCOUNTER — Telehealth: Payer: Self-pay

## 2016-12-12 NOTE — Progress Notes (Signed)
EPIC Encounter for ICM Monitoring  Patient Name: David Bright is a 56 y.o. male Date: 12/12/2016 Primary Care Physican: Luetta Nutting, DO Primary Cardiologist:[facility]  Electrophysiologist: Caryl Comes Dry Weight: Last weight on 4/24 was 280 lbs (Baseline 265-269 lbs)       Attempted call to patient and unable to reach.  Left message to return call.  Transmission reviewed.    Thoracic impedance is closer to baseline since Furosemide was increased to 40 mg twice a day on 12/03/2016.        Prescribed dose of Furosemide40 mg 1 tablet twice a day (increased 12/03/16). Potassium 20 mEq 1 tablet daily  LABS: 11/22/2016 Creatinine 1.03, BUN 19, Potassium 4.6, Sodium 136, EGFR >60 10/28/2016 Creatinine 1.48, BUN 48, Potassium 4.3, Sodium 138, EGFR 52-60 10/14/2016 Creatinine 1.19, BUN 23, Potassium 4.2, Sodium 139, EGFR >60 10/04/2016 Creatinine 1.18, BUN 35, Potassium 4.2, Sodium 138, EGFR >60 09/03/2016 Creatinine 1.13, BUN 28, Potassium 3.9, Sodium 138 07/19/2016 Creatinine 1.09, BUN 27, Potassium 4.3, Sodium 138 07/08/2016 Creatinine 1.22, BUN 27, Potassium 3.9, Sodium 142 06/12/2016 Creatinine 1.12, BUN 22, Potassium 4.0, Sodium 140 05/02/2016 Creatinine 1.33, BUN 34, Potassium 4.1, Sodium 136 03/20/2016 Creatinine 1.17, BUN 36, Potassium 4.7, Sodium 136 11/03/2015 Creatinine 1.03, BUN 17, Potassium 4.0, Sodium 140  Recommendations: NONE - Unable to reach patient   Follow-up plan: ICM clinic phone appointment on 01/13/2017.  Office appointment scheduled on 5/18/2018with HF clinic.  Copy of ICM check sent to primary cardiologist and device physician.   3 month ICM trend: 12/10/2016   1 Year ICM trend:      [name], RN 12/12/2016 3:10 PM

## 2016-12-12 NOTE — Telephone Encounter (Signed)
Remote ICM transmission received.  Attempted patient call and left message to return call.   

## 2016-12-16 NOTE — Progress Notes (Signed)
Patient returned call.  He stated he is feeling fine.  Reviewed transmission and advised fluid levels closer to baseline since increasing Furosemide 40 mg to 1 tablet twice a day.  Next ICM remote transmission 01/13/2017 and advised to call for any fluid levels.

## 2016-12-23 ENCOUNTER — Telehealth: Payer: Self-pay

## 2016-12-23 NOTE — Telephone Encounter (Signed)
Will forward to CHF clinical pharmD Ileene Patrick to advise

## 2016-12-23 NOTE — Telephone Encounter (Signed)
Would advise patient not to take Bidil on the days he wants to use sildenafil and wait 24 hours after using sildenafil to restart Bidil.   Ruta Hinds. Velva Harman, PharmD, BCPS, CPP Clinical Pharmacist Pager: [phone] Phone: [phone] 12/23/2016 2:14 PM

## 2016-12-23 NOTE — Telephone Encounter (Signed)
Received patient call stating his pharmacist had alerted him he needed to check with physician regarding taking Sildenafil 20 mg as needed for sexual activity while he is taking Bidil as prescribed.  Dr Einar Pheasant prescribed the Sildenafil.  Advised would send to Dr Claris Gladden office for review and they will contact him.  Advised not to take Sildenafil until it is clarified by physician office.

## 2016-12-27 ENCOUNTER — Encounter (HOSPITAL_COMMUNITY): Payer: Self-pay

## 2016-12-27 ENCOUNTER — Ambulatory Visit (HOSPITAL_COMMUNITY)
Admission: RE | Admit: 2016-12-27 | Discharge: 2016-12-27 | Disposition: A | Discharge status: Home / Self Care | Payer: Medicaid Other | Source: Ambulatory Visit | Attending: Cardiology | Admitting: Cardiology

## 2016-12-27 VITALS — BP 144/74 | HR 75 | Ht 72.0 in | Wt 285.5 lb

## 2016-12-27 DIAGNOSIS — F17210 Nicotine dependence, cigarettes, uncomplicated: Secondary | ICD-10-CM | POA: Diagnosis not present

## 2016-12-27 DIAGNOSIS — I5042 Chronic combined systolic (congestive) and diastolic (congestive) heart failure: Secondary | ICD-10-CM | POA: Diagnosis not present

## 2016-12-27 DIAGNOSIS — J449 Chronic obstructive pulmonary disease, unspecified: Secondary | ICD-10-CM | POA: Insufficient documentation

## 2016-12-27 DIAGNOSIS — E1151 Type 2 diabetes mellitus with diabetic peripheral angiopathy without gangrene: Secondary | ICD-10-CM | POA: Insufficient documentation

## 2016-12-27 DIAGNOSIS — E785 Hyperlipidemia, unspecified: Secondary | ICD-10-CM | POA: Insufficient documentation

## 2016-12-27 DIAGNOSIS — I739 Peripheral vascular disease, unspecified: Secondary | ICD-10-CM | POA: Diagnosis not present

## 2016-12-27 DIAGNOSIS — I110 Hypertensive heart disease with heart failure: Secondary | ICD-10-CM | POA: Diagnosis present

## 2016-12-27 DIAGNOSIS — Z7984 Long term (current) use of oral hypoglycemic drugs: Secondary | ICD-10-CM | POA: Insufficient documentation

## 2016-12-27 DIAGNOSIS — I5022 Chronic systolic (congestive) heart failure: Secondary | ICD-10-CM | POA: Diagnosis present

## 2016-12-27 DIAGNOSIS — R0602 Shortness of breath: Secondary | ICD-10-CM

## 2016-12-27 DIAGNOSIS — G4733 Obstructive sleep apnea (adult) (pediatric): Secondary | ICD-10-CM | POA: Diagnosis not present

## 2016-12-27 DIAGNOSIS — Z79899 Other long term (current) drug therapy: Secondary | ICD-10-CM | POA: Insufficient documentation

## 2016-12-27 DIAGNOSIS — Z7982 Long term (current) use of aspirin: Secondary | ICD-10-CM | POA: Insufficient documentation

## 2016-12-27 LAB — BRAIN NATRIURETIC PEPTIDE: B NATRIURETIC PEPTIDE 5: 55.4 pg/mL (ref 0.0–100.0)

## 2016-12-27 LAB — BASIC METABOLIC PANEL
Anion gap: 8 (ref 5–15)
BUN: 18 mg/dL (ref 6–20)
CO2: 25 mmol/L (ref 22–32)
CREATININE: 0.98 mg/dL (ref 0.61–1.24)
Calcium: 9.2 mg/dL (ref 8.9–10.3)
Chloride: 106 mmol/L (ref 101–111)
GFR calc Af Amer: >60 mL/min (ref >60)
GLUCOSE: 147 mg/dL — AB (ref 65–99)
Potassium: 4.5 mmol/L (ref 3.5–5.1)
SODIUM: 139 mmol/L (ref 135–145)

## 2016-12-27 MED ORDER — SACUBITRIL-VALSARTAN 97-103 MG PO TABS: 1.0000 | ORAL_TABLET | Freq: Two times a day (BID) | ORAL | 6 refills | Status: DC

## 2016-12-27 NOTE — Patient Instructions (Signed)
INCREASE Entresto to 97/103 mg, one tab twice a day  You must PLAN your Viagra use, if you use Viagra you are NOT to take Bidil at all that day  Labs today We will only contact you if something comes back abnormal or we need to make some changes. Otherwise no news is good news!  Labs again in 10 days  Non-Cardiac CT scanning, (CAT scanning), is a noninvasive, special x-ray that produces cross-sectional images of the body using x-rays and a computer. CT scans help physicians diagnose and treat medical conditions. For some CT exams, a contrast material is used to enhance visibility in the area of the body being studied. CT scans provide greater clarity and reveal more details than regular x-ray exams.  Your physician recommends that you schedule a follow-up appointment in: 1 month  Do the following things EVERYDAY: 1) Weigh yourself in the morning before breakfast. Write it down and keep it in a log. 2) Take your medicines as prescribed 3) Eat low salt foods-Limit salt (sodium) to 2000 mg per day.  4) Stay as active as you can everyday 5) Limit all fluids for the day to less than 2 liters

## 2016-12-29 NOTE — Progress Notes (Signed)
PCP: [facility] Cardiology: Dr. Aundra Dubin  56 yo with history of chronic systolic CHF and COPD/active smoking presents for CHF clinic evaluation.  He has a known nonischemic cardiomyopathy, echo in 1/17 with EF 20% and a severely dilated LV.  No coronary disease on 7/16 cardiac cath.  Repeat echo was 2/18, showing EF up to 40%.   He is trying stop smoking with Chantix, down to 3-4 cigs/day. Still with leg fatigue/cramping after walking 100 yards.  He had significant PAD by peripheral arterial dopplers, saw Dr. Gwenlyn Found with plan of medical management for now.   Weight is up again today. Still eating a lot.  He has mild dyspnea after walking about 75 yards (but does not have to stop) or walking up stairs.  No chest pain.  No lightheadedness or palpitations.  Mild orthopnea, has been sleeping on a couple of pillows.  No chest pain.  He has OSA and is using CPAP.  He has been using Viagra about once every 2 wks.   Labs (12/17): K 4.3, creatinine 1.09 Labs (1/18): K 3.9, creatinine 1.13 Labs (3/18): K 4.3, creatinine 1.48, NT-proBNP 61 Labs (4/18): K 4.5, creatinine 1.23, LDL 71, BNP 34  PMH:  1. Chronic systolic CHF: Nonischemic cardiomyopathy.  Has Medtronic ICD.   - LHC (7/16) with nonobstructive disease.  - Echo (1/17) with EF 20%, severe LV dilation, mild to moderate MR.  - Echo (2/18): EF 40%, mild LV dilation.  2. COPD: Active smoker.  - PFTs (2/18) with minimal obstruction, mild restriction suggesting a parenchymal process, low DLCO.  3. Type II diabetes with peripheral neuropathy.  4. OSA on CPAP 5. HTN 6. Hyperlipidemia 7. PAD: ABIs abnormal at outside office.  - Peripheral arterial dopplers (3/18) with > 50% left CIA and bilateral EIA stenosis, 50-74% mid LSFA stenosis.   SH: Lives in [place], works at [employer] and [employer], smokes about 1 ppd.  Rare ETOH.  Remote substance abuse.   FH: Aunt with CHF, brother with ESRD.    ROS: All systems reviewed and negative except as per  HPI.  Current Outpatient Prescriptions  Medication Sig Dispense Refill  . aspirin EC 81 MG tablet Take 1 tablet (81 mg total) by mouth daily. 30 tablet 0  . atorvastatin (LIPITOR) 20 MG tablet Take 1 tablet by mouth daily.    . canagliflozin (INVOKANA) 300 MG TABS tablet Take 300 mg by mouth daily before breakfast.    . carvedilol (COREG) 12.5 MG tablet TAKE ONE TABLET BY MOUTH TWICE DAILY WITH A MEAL 180 tablet 2  . CORLANOR 5 MG TABS tablet TAKE ONE TABLET BY MOUTH TWICE DAILY WITH MEALS 60 tablet 10  . furosemide (LASIX) 40 MG tablet Take 1 tablet (40 mg total) by mouth 2 (two) times daily. 180 tablet 3  . isosorbide-hydrALAZINE (BIDIL) 20-37.5 MG tablet Take 1 tablet by mouth 2 (two) times daily.    . potassium chloride SA (K-DUR,KLOR-CON) 20 MEQ tablet Take 1 tablet (20 mEq total) by mouth daily. 90 tablet 3  . sildenafil (REVATIO) 20 MG tablet Take 20 mg by mouth. Pt reported 12/23/16 he takes 2-5 tablets prn for sexual activity.    . sitaGLIPtin-metformin (JANUMET) 50-1000 MG tablet Take 1 tablet by mouth 2 (two) times daily.    Marland Kitchen spironolactone (ALDACTONE) 25 MG tablet TAKE ONE TABLET BY MOUTH ONCE DAILY 90 tablet 2  . varenicline (CHANTIX PAK) 0.5 MG X 11 & 1 MG X 42 tablet Take by mouth 2 (two)  times daily. Take one 0.5 mg tablet by mouth once daily for 3 days, then increase to one 0.5 mg tablet twice daily for 4 days, then increase to one 1 mg tablet twice daily.    . sacubitril-valsartan (ENTRESTO) 97-103 MG Take 1 tablet by mouth 2 (two) times daily. 60 tablet 6   No current facility-administered medications for this encounter.    BP (!) 144/74   Pulse 75   Ht 6' (1.829 m)   Wt 285 lb 8 oz (129.5 kg)   SpO2 99%   BMI 38.72 kg/m  General: NAD Neck: Thick, JVP 7 cm,  no thyromegaly or thyroid nodule.  Lungs: Clear bilaterally CV: Nondisplaced PMI.  Heart regular S1/S2, no S3/S4, no murmur.  1+ ankle edema.  No carotid bruit.  Unable to palpate pedal pulses.  Abdomen: Soft,  nontender, no hepatosplenomegaly, no distention.  Skin: Intact without lesions or rashes.  Neurologic: Alert and oriented x 3.  Psych: Normal affect. Extremities: No clubbing or cyanosis.  HEENT: Normal.   Assessment/Plan: 1. Chronic systolic CHF: Nonischemic cardiomyopathy => ?due to viral myocarditis or prior substance abuse.  Medtronic ICD.  Echo 1/17 with severely dilated LV and EF 20%, EF up to 40% on echo 2/18.  NYHA class II-III symptoms.  He does not look volume overloaded on exam though weight is up.  Think this may be caloric more than fluid-related.   - Continue Lasix 40 mg bid.  - Increase Entresto to 97/103 bid, this should give additional diuresis.  BMET today, repeat in 10 days.  - Continue current Coreg, ivabradine and spironolactone.  - Continue Bidil 1 tab bid (can only remember to take twice).  He occasionally uses Viagra.  We had a long discussion about this.  He understands the risk of Viagra with Bidil. He will make sure to plan Viagra use and not take Bidil that day.  - Narrow QRS, no indication for CRT upgrade, and EF too high now for ICD.  - Followup in 1 month to reassess volume status.  2. Smoking/suspect COPD: He is using Chantix to try to quit smoking, cutting back.   PFTs done, somewhat concerning for interstitial lung disease rather than emphysema with restrictive PFTs and decreased DLCO.  - I will try again to arrange for high resolution CT chest to assess for ILD.  3. OSA: Continue CPAP.  4. PAD: Significant disease on peripheral dopplers, stable claudication after about 100 yards.  He saw Dr. Gwenlyn Found, medical management recommended.  I encouraged him to try to walk at least a short distance through the pain.  - Continue ASA 81 and statin. Good lipids in 4/18.   Followup in 1 month.   Loralie Champagne 12/29/2016

## 2016-12-30 ENCOUNTER — Telehealth (HOSPITAL_COMMUNITY): Payer: Self-pay | Admitting: Vascular Surgery

## 2016-12-30 NOTE — Telephone Encounter (Signed)
Spoke to pt directly gave pt CT appt 01/03/17 @ 11:00 am @ [facility]

## 2017-01-01 ENCOUNTER — Ambulatory Visit: Payer: Medicaid Other | Admitting: Cardiology

## 2017-01-03 ENCOUNTER — Ambulatory Visit (HOSPITAL_COMMUNITY): Payer: Medicaid Other

## 2017-01-07 ENCOUNTER — Telehealth (HOSPITAL_COMMUNITY): Payer: Self-pay | Admitting: *Deleted

## 2017-01-07 ENCOUNTER — Inpatient Hospital Stay (HOSPITAL_COMMUNITY): Admission: RE | Admit: 2017-01-07 | Payer: Medicaid Other | Source: Ambulatory Visit

## 2017-01-07 DIAGNOSIS — J849 Interstitial pulmonary disease, unspecified: Secondary | ICD-10-CM

## 2017-01-07 NOTE — Telephone Encounter (Signed)
Called patient to let him know we would need a chest x-ray in order for insurance to cover a high resolution chest CT.  Order in for chest x-ray need patient to come in this week for xray. Patient did not answer no voicemail set up.  Will try patient again tomorrow.

## 2017-01-08 ENCOUNTER — Ambulatory Visit (HOSPITAL_COMMUNITY)
Admission: RE | Admit: 2017-01-08 | Discharge: 2017-01-08 | Disposition: A | Discharge status: Home / Self Care | Payer: Medicaid Other | Source: Ambulatory Visit | Attending: Cardiology | Admitting: Cardiology

## 2017-01-08 ENCOUNTER — Encounter (HOSPITAL_COMMUNITY): Payer: Self-pay

## 2017-01-08 ENCOUNTER — Ambulatory Visit (HOSPITAL_COMMUNITY): Payer: Medicaid Other

## 2017-01-08 DIAGNOSIS — Z950 Presence of cardiac pacemaker: Secondary | ICD-10-CM | POA: Diagnosis not present

## 2017-01-08 DIAGNOSIS — I517 Cardiomegaly: Secondary | ICD-10-CM | POA: Diagnosis not present

## 2017-01-08 DIAGNOSIS — I5042 Chronic combined systolic (congestive) and diastolic (congestive) heart failure: Secondary | ICD-10-CM | POA: Insufficient documentation

## 2017-01-08 DIAGNOSIS — R918 Other nonspecific abnormal finding of lung field: Secondary | ICD-10-CM | POA: Insufficient documentation

## 2017-01-08 DIAGNOSIS — J849 Interstitial pulmonary disease, unspecified: Secondary | ICD-10-CM

## 2017-01-08 DIAGNOSIS — J9811 Atelectasis: Secondary | ICD-10-CM | POA: Insufficient documentation

## 2017-01-08 LAB — BASIC METABOLIC PANEL
Anion gap: 6 (ref 5–15)
BUN: 29 mg/dL — AB (ref 6–20)
CO2: 28 mmol/L (ref 22–32)
CREATININE: 1.2 mg/dL (ref 0.61–1.24)
Calcium: 9.1 mg/dL (ref 8.9–10.3)
Chloride: 104 mmol/L (ref 101–111)
GFR calc Af Amer: >60 mL/min (ref >60)
GLUCOSE: 139 mg/dL — AB (ref 65–99)
Potassium: 4.5 mmol/L (ref 3.5–5.1)
SODIUM: 138 mmol/L (ref 135–145)

## 2017-01-13 ENCOUNTER — Ambulatory Visit (INDEPENDENT_AMBULATORY_CARE_PROVIDER_SITE_OTHER): Payer: Medicaid Other

## 2017-01-13 DIAGNOSIS — I5042 Chronic combined systolic (congestive) and diastolic (congestive) heart failure: Secondary | ICD-10-CM | POA: Diagnosis not present

## 2017-01-13 DIAGNOSIS — Z95810 Presence of automatic (implantable) cardiac defibrillator: Secondary | ICD-10-CM | POA: Diagnosis not present

## 2017-01-13 NOTE — Progress Notes (Signed)
EPIC Encounter for ICM Monitoring  Patient Name: David Bright is a 56 y.o. male Date: 01/13/2017 Primary Care Physican: Luetta Nutting, DO Primary Cardiologist:[facility]  Electrophysiologist: Caryl Comes Dry Weight: unknown      Attempted call to patient and unable to reach.  Left detailed message regarding transmission.  Transmission reviewed.    Thoracic impedance normal since 01/04/2017 but was abnormal suggesting fluid accumulation from 12/16/2016 to 01/03/2017.        Prescribed dose of Furosemide40 mg 1 tablet twice a day. Potassium 20 mEq 1 tablet daily  LABS: 11/22/2016 Creatinine 1.03, BUN 19, Potassium 4.6, Sodium 136, EGFR >60 10/28/2016 Creatinine 1.48, BUN 48, Potassium 4.3, Sodium 138, EGFR 52-60 10/14/2016 Creatinine 1.19, BUN 23, Potassium 4.2, Sodium 139, EGFR >60 10/04/2016 Creatinine 1.18, BUN 35, Potassium 4.2, Sodium 138, EGFR >60 09/03/2016 Creatinine 1.13, BUN 28, Potassium 3.9, Sodium 138 07/19/2016 Creatinine 1.09, BUN 27, Potassium 4.3, Sodium 138 07/08/2016 Creatinine 1.22, BUN 27, Potassium 3.9, Sodium 142 06/12/2016 Creatinine 1.12, BUN 22, Potassium 4.0, Sodium 140 05/02/2016 Creatinine 1.33, BUN 34, Potassium 4.1, Sodium 136 03/20/2016 Creatinine 1.17, BUN 36, Potassium 4.7, Sodium 136 11/03/2015 Creatinine 1.03, BUN 17, Potassium 4.0, Sodium 140  Recommendations:  Left voice mail with ICM number and encouraged to call for fluid symptoms.  Follow-up plan: ICM clinic phone appointment on 02/13/2017.  Office appointment scheduled on 01/29/2017 with HF clinic.  Copy of ICM check sent to device physician.   3 month ICM trend: 01/13/2017   1 Year ICM trend:      [name], RN 01/13/2017 9:48 AM

## 2017-01-15 ENCOUNTER — Ambulatory Visit (HOSPITAL_COMMUNITY)
Admission: RE | Admit: 2017-01-15 | Discharge: 2017-01-15 | Disposition: A | Discharge status: Home / Self Care | Payer: Medicaid Other | Source: Ambulatory Visit | Attending: Cardiology | Admitting: Cardiology

## 2017-01-15 DIAGNOSIS — I2510 Atherosclerotic heart disease of native coronary artery without angina pectoris: Secondary | ICD-10-CM | POA: Diagnosis not present

## 2017-01-15 DIAGNOSIS — I700 Atherosclerosis of aorta: Secondary | ICD-10-CM | POA: Diagnosis not present

## 2017-01-15 DIAGNOSIS — I5042 Chronic combined systolic (congestive) and diastolic (congestive) heart failure: Secondary | ICD-10-CM | POA: Insufficient documentation

## 2017-01-15 DIAGNOSIS — J439 Emphysema, unspecified: Secondary | ICD-10-CM | POA: Diagnosis not present

## 2017-01-15 DIAGNOSIS — R0602 Shortness of breath: Secondary | ICD-10-CM | POA: Insufficient documentation

## 2017-01-15 DIAGNOSIS — D3500 Benign neoplasm of unspecified adrenal gland: Secondary | ICD-10-CM | POA: Diagnosis not present

## 2017-01-22 ENCOUNTER — Other Ambulatory Visit: Payer: Self-pay | Admitting: Cardiology

## 2017-01-22 NOTE — Telephone Encounter (Signed)
This is a CHF pt 

## 2017-01-29 ENCOUNTER — Encounter (HOSPITAL_COMMUNITY): Payer: Self-pay

## 2017-01-29 ENCOUNTER — Ambulatory Visit (HOSPITAL_COMMUNITY)
Admission: RE | Admit: 2017-01-29 | Discharge: 2017-01-29 | Disposition: A | Discharge status: Home / Self Care | Payer: Medicaid Other | Source: Ambulatory Visit | Attending: Cardiology | Admitting: Cardiology

## 2017-01-29 VITALS — BP 134/60 | HR 79 | Wt 284.8 lb

## 2017-01-29 DIAGNOSIS — I428 Other cardiomyopathies: Secondary | ICD-10-CM | POA: Diagnosis not present

## 2017-01-29 DIAGNOSIS — G4733 Obstructive sleep apnea (adult) (pediatric): Secondary | ICD-10-CM | POA: Insufficient documentation

## 2017-01-29 DIAGNOSIS — F17210 Nicotine dependence, cigarettes, uncomplicated: Secondary | ICD-10-CM | POA: Insufficient documentation

## 2017-01-29 DIAGNOSIS — I110 Hypertensive heart disease with heart failure: Secondary | ICD-10-CM | POA: Insufficient documentation

## 2017-01-29 DIAGNOSIS — I5042 Chronic combined systolic (congestive) and diastolic (congestive) heart failure: Secondary | ICD-10-CM

## 2017-01-29 DIAGNOSIS — R918 Other nonspecific abnormal finding of lung field: Secondary | ICD-10-CM | POA: Insufficient documentation

## 2017-01-29 DIAGNOSIS — E1142 Type 2 diabetes mellitus with diabetic polyneuropathy: Secondary | ICD-10-CM | POA: Diagnosis not present

## 2017-01-29 DIAGNOSIS — Z95810 Presence of automatic (implantable) cardiac defibrillator: Secondary | ICD-10-CM | POA: Diagnosis not present

## 2017-01-29 DIAGNOSIS — Z79899 Other long term (current) drug therapy: Secondary | ICD-10-CM | POA: Insufficient documentation

## 2017-01-29 DIAGNOSIS — Z7984 Long term (current) use of oral hypoglycemic drugs: Secondary | ICD-10-CM | POA: Insufficient documentation

## 2017-01-29 DIAGNOSIS — Z7982 Long term (current) use of aspirin: Secondary | ICD-10-CM | POA: Diagnosis not present

## 2017-01-29 DIAGNOSIS — Z8669 Personal history of other diseases of the nervous system and sense organs: Secondary | ICD-10-CM | POA: Diagnosis not present

## 2017-01-29 DIAGNOSIS — E785 Hyperlipidemia, unspecified: Secondary | ICD-10-CM | POA: Insufficient documentation

## 2017-01-29 DIAGNOSIS — I739 Peripheral vascular disease, unspecified: Secondary | ICD-10-CM | POA: Diagnosis not present

## 2017-01-29 DIAGNOSIS — J449 Chronic obstructive pulmonary disease, unspecified: Secondary | ICD-10-CM | POA: Diagnosis not present

## 2017-01-29 DIAGNOSIS — I5022 Chronic systolic (congestive) heart failure: Secondary | ICD-10-CM | POA: Insufficient documentation

## 2017-01-29 MED ORDER — ISOSORB DINITRATE-HYDRALAZINE 20-37.5 MG PO TABS: 1.0000 | ORAL_TABLET | Freq: Three times a day (TID) | ORAL | 6 refills | Status: DC

## 2017-01-29 NOTE — Progress Notes (Signed)
PCP: [facility] Cardiology: Dr. Aundra Dubin  56 yo with history of chronic systolic CHF and COPD/active smoking presents for CHF clinic evaluation.  He has a known nonischemic cardiomyopathy, echo in 1/17 with EF 20% and a severely dilated LV.  No coronary disease on 7/16 cardiac cath.  Repeat echo was 2/18, showing EF up to 40%.   He is trying stop smoking with Chantix, down to 3-4 cigs/day. Still with leg fatigue/cramping after walking 100 yards.  He had significant PAD by peripheral arterial dopplers, saw Dr. Gwenlyn Found with plan of medical management for now.   He presents today for HF follow up. Overall feeling well, he is trying to quit smoking, he stopped taking Chantix, but wants to restart Chantix to stop smoking. Not weighing regularly at home, eating some high salt foods, drinking more than 2L a day. Denies SOB with walking into clinic, no SOB with ADL's or at rest. Taking all medications, except he is still taking 1/2 tab of Bidil instead of a whole tablet.   Labs (12/17): K 4.3, creatinine 1.09 Labs (1/18): K 3.9, creatinine 1.13 Labs (3/18): K 4.3, creatinine 1.48, NT-proBNP 61 Labs (4/18): K 4.5, creatinine 1.23, LDL 71, BNP 34  PMH:  1. Chronic systolic CHF: Nonischemic cardiomyopathy.  Has Medtronic ICD.   - LHC (7/16) with nonobstructive disease.  - Echo (1/17) with EF 20%, severe LV dilation, mild to moderate MR.  - Echo (2/18): EF 40%, mild LV dilation.  2. COPD: Active smoker.  - PFTs (2/18) with minimal obstruction, mild restriction suggesting a parenchymal process, low DLCO.  3. Type II diabetes with peripheral neuropathy.  4. OSA on CPAP 5. HTN 6. Hyperlipidemia 7. PAD: ABIs abnormal at outside office.  - Peripheral arterial dopplers (3/18) with > 50% left CIA and bilateral EIA stenosis, 50-74% mid LSFA stenosis.   SH: Lives in [place], works at [employer] and [employer], smokes about 1 ppd.  Rare ETOH.  Remote substance abuse.   FH: Aunt with CHF, brother with  ESRD.    ROS: All systems reviewed and negative except as per HPI.  Current Outpatient Prescriptions  Medication Sig Dispense Refill  . aspirin EC 81 MG tablet Take 1 tablet (81 mg total) by mouth daily. 30 tablet 0  . atorvastatin (LIPITOR) 20 MG tablet Take 1 tablet by mouth daily.    . canagliflozin (INVOKANA) 300 MG TABS tablet Take 300 mg by mouth daily before breakfast.    . carvedilol (COREG) 12.5 MG tablet TAKE ONE TABLET BY MOUTH TWICE DAILY WITH MEALS 180 tablet 2  . CORLANOR 5 MG TABS tablet TAKE ONE TABLET BY MOUTH TWICE DAILY WITH MEALS 60 tablet 10  . furosemide (LASIX) 40 MG tablet Take 1 tablet (40 mg total) by mouth 2 (two) times daily. 180 tablet 3  . isosorbide-hydrALAZINE (BIDIL) 20-37.5 MG tablet Take 1 tablet by mouth 2 (two) times daily.    . potassium chloride SA (K-DUR,KLOR-CON) 20 MEQ tablet Take 1 tablet (20 mEq total) by mouth daily. 90 tablet 3  . sacubitril-valsartan (ENTRESTO) 97-103 MG Take 1 tablet by mouth 2 (two) times daily. 60 tablet 6  . sildenafil (REVATIO) 20 MG tablet Take 20 mg by mouth. Pt reported 12/23/16 he takes 2-5 tablets prn for sexual activity.    . sitaGLIPtin-metformin (JANUMET) 50-1000 MG tablet Take 1 tablet by mouth 2 (two) times daily.    Marland Kitchen spironolactone (ALDACTONE) 25 MG tablet TAKE ONE TABLET BY MOUTH ONCE DAILY  90 tablet 2  . varenicline (CHANTIX PAK) 0.5 MG X 11 & 1 MG X 42 tablet Take by mouth 2 (two) times daily. Take one 0.5 mg tablet by mouth once daily for 3 days, then increase to one 0.5 mg tablet twice daily for 4 days, then increase to one 1 mg tablet twice daily.     No current facility-administered medications for this encounter.    BP 134/60 (BP Location: Right Arm, Patient Position: Sitting, Cuff Size: Normal)   Pulse 79   Wt 284 lb 12.8 oz (129.2 kg)   SpO2 100%   BMI 38.63 kg/m  General: Male, NAD. Walked into clinic without difficulty Neck: Thick, no JVD. no thyromegaly or thyroid nodule.  Lungs: Clear  bilaterally, normal effort.  CV: Nondisplaced PMI. Heart regular S1/S2, no S3/S4, no murmur. No peripheral edema  No carotid bruit. Abdomen: Soft, nontender, no hepatosplenomegaly, non distended. Skin: Intact without lesions or rashes.  Neurologic: Alert and oriented x 3.  Psych: Normal affect. Extremities: No clubbing or cyanosis.  HEENT: Normal.   Assessment/Plan: 1. Chronic systolic CHF: Nonischemic cardiomyopathy => ?due to viral myocarditis or prior substance abuse.  Medtronic ICD.  Echo 1/17 with severely dilated LV and EF 20%, EF up to 40% on echo 2/18.  - NYHA II - Volume status stable.  - Continue Lasix 40mg  BID.  - Continue Entresto 97/103 mg BID.  - Increase Bidil to 1 tab TID. He knows to hold Bidil if he is going to take Viagra.  - Continue Coreg 12.5mg  BID.  - Continue Spiro 25mg  daily.  - Narrow QRS, no indication for CRT upgrade, and EF too high now for ICD.  2. Smoking/suspect COPD: He is using Chantix to try to quit smoking, cutting back.   PFTs done, somewhat concerning for interstitial lung disease rather than emphysema with restrictive PFTs and decreased DLCO.  - Chest CT without ILD. Will repeat chest CT in one year as he is a smoker and there are pulmonary nodules noted.  3. OSA: Continue CPAP.  4. PAD: - Follows with Dr. Gwenlyn Found. Plan for medical management  - Continue ASA and Lipitor.   Followup in 4 months with Dr. Melene Muller 01/29/2017

## 2017-01-29 NOTE — Patient Instructions (Signed)
No lab work today.  INCREASE Bidil to 1 tab three times daily.  Follow up 4 months with Dr. Aundra Dubin. We will call you closer to this time, or you may call our office to schedule 1 month before you are due to be seen. Take all medication as prescribed the day of your appointment. Bring all medications with you to your appointment.  Do the following things EVERYDAY: 1) Weigh yourself in the morning before breakfast. Write it down and keep it in a log. 2) Take your medicines as prescribed 3) Eat low salt foods-Limit salt (sodium) to 2000 mg per day.  4) Stay as active as you can everyday 5) Limit all fluids for the day to less than 2 liters

## 2017-02-13 ENCOUNTER — Ambulatory Visit (INDEPENDENT_AMBULATORY_CARE_PROVIDER_SITE_OTHER): Payer: Medicaid Other | Admitting: *Deleted

## 2017-02-13 DIAGNOSIS — Z95810 Presence of automatic (implantable) cardiac defibrillator: Secondary | ICD-10-CM | POA: Diagnosis not present

## 2017-02-13 DIAGNOSIS — I5042 Chronic combined systolic (congestive) and diastolic (congestive) heart failure: Secondary | ICD-10-CM

## 2017-02-13 DIAGNOSIS — I428 Other cardiomyopathies: Secondary | ICD-10-CM

## 2017-02-13 NOTE — Progress Notes (Signed)
EPIC Encounter for ICM Monitoring  Patient Name: David Bright is a 56 y.o. male Date: 02/13/2017 Primary Care Physican: Luetta Nutting, DO Primary Cardiologist:Nelson/[facility] HF  Electrophysiologist: Caryl Comes Dry Weight: unknown         Heart Failure questions reviewed, pt asymptomatic.   Thoracic impedance normal but was abnormal suggesting fluid accumulation from 01/30/2017 to 02/10/2017.  He has been forgetting the evening dose of Furosemide but will get back on track taking it twice a day        Prescribed dose of Furosemide40 mg 1 tablet twice a day. Potassium 20 mEq 1 tablet daily.    LABS: 11/22/2016 Creatinine 1.03, BUN 19, Potassium 4.6, Sodium 136, EGFR >60 10/28/2016 Creatinine 1.48, BUN 48, Potassium 4.3, Sodium 138, EGFR 52-60 10/14/2016 Creatinine 1.19, BUN 23, Potassium 4.2, Sodium 139, EGFR >60 10/04/2016 Creatinine 1.18, BUN 35, Potassium 4.2, Sodium 138, EGFR >60 09/03/2016 Creatinine 1.13, BUN 28, Potassium 3.9, Sodium 138 07/19/2016 Creatinine 1.09, BUN 27, Potassium 4.3, Sodium 138 07/08/2016 Creatinine 1.22, BUN 27, Potassium 3.9, Sodium 142 06/12/2016 Creatinine 1.12, BUN 22, Potassium 4.0, Sodium 140 05/02/2016 Creatinine 1.33, BUN 34, Potassium 4.1, Sodium 136 03/20/2016 Creatinine 1.17, BUN 36, Potassium 4.7, Sodium 136 11/03/2015 Creatinine 1.03, BUN 17, Potassium 4.0, Sodium 140  Recommendations: No changes.  Encouraged to call for fluid symptoms.  Follow-up plan: ICM clinic phone appointment on 03/18/2017.    Copy of ICM check sent to device physician.   3 month ICM trend: 02/13/2017   1 Year ICM trend:      [name], RN 02/13/2017 2:38 PM

## 2017-02-21 ENCOUNTER — Encounter: Payer: Self-pay | Admitting: Cardiology

## 2017-02-26 ENCOUNTER — Other Ambulatory Visit (HOSPITAL_COMMUNITY): Payer: Self-pay | Admitting: *Deleted

## 2017-02-26 MED ORDER — ISOSORB DINITRATE-HYDRALAZINE 20-37.5 MG PO TABS: 1.0000 | ORAL_TABLET | Freq: Three times a day (TID) | ORAL | 6 refills | Status: DC

## 2017-03-11 LAB — CUP PACEART REMOTE DEVICE CHECK
Date Time Interrogation Session: 20180705084224
HIGH POWER IMPEDANCE MEASURED VALUE: 67 Ohm
Implantable Lead Implant Date: 20160725
Implantable Lead Model: 293
Implantable Lead Serial Number: 383593
Implantable Pulse Generator Implant Date: 20160725
Lead Channel Impedance Value: 342 Ohm
Lead Channel Pacing Threshold Amplitude: 0.875 V
Lead Channel Sensing Intrinsic Amplitude: 11 mV
Lead Channel Sensing Intrinsic Amplitude: 11 mV
Lead Channel Setting Pacing Pulse Width: 0.4 ms
Lead Channel Setting Sensing Sensitivity: 0.3 mV
MDC IDC LEAD LOCATION: 753860
MDC IDC MSMT BATTERY REMAINING LONGEVITY: 126 mo
MDC IDC MSMT BATTERY VOLTAGE: 3.02 V
MDC IDC MSMT LEADCHNL RV IMPEDANCE VALUE: 342 Ohm
MDC IDC MSMT LEADCHNL RV PACING THRESHOLD PULSEWIDTH: 0.4 ms
MDC IDC SET LEADCHNL RV PACING AMPLITUDE: 2.5 V
MDC IDC STAT BRADY RV PERCENT PACED: 0 %

## 2017-03-18 ENCOUNTER — Ambulatory Visit (INDEPENDENT_AMBULATORY_CARE_PROVIDER_SITE_OTHER): Payer: Medicaid Other

## 2017-03-18 DIAGNOSIS — I5042 Chronic combined systolic (congestive) and diastolic (congestive) heart failure: Secondary | ICD-10-CM | POA: Diagnosis not present

## 2017-03-18 DIAGNOSIS — Z95810 Presence of automatic (implantable) cardiac defibrillator: Secondary | ICD-10-CM

## 2017-03-18 NOTE — Progress Notes (Signed)
EPIC Encounter for ICM Monitoring  Patient Name: David Bright is a 56 y.o. male Date: 03/18/2017 Primary Care Physican: Luetta Nutting, DO Primary Cardiologist:Nelson/[facility] HF  Electrophysiologist: Caryl Comes Dry Weight: 285 lbs  Since 13-Feb-2017: VT-NS (>4 beats, >171 bpm)  1       Heart Failure questions reviewed, pt asymptomatic    Thoracic impedance normal.        Prescribed dosage: Furosemide40 mg 1 tablet twice a day. Potassium 20 mEq 1 tablet daily.    LABS: 01/08/2017 Creatinine 1.20, BUN 29, Potassium 4.5, Sodium 138, EGFR >60 12/27/2016 Creatinine 0.98, BUN 18, Potassium 4.5, Sodium 139, EGFR >60 12/02/2016 Creatinine 1.23, BUN 31, Potassium 4.5, Sodium 138, EGFR >60 11/22/2016 Creatinine 1.03, BUN 19, Potassium 4.6, Sodium 136, EGFR >60 10/28/2016 Creatinine 1.48, BUN 48, Potassium 4.3, Sodium 138, EGFR 52-60 10/14/2016 Creatinine 1.19, BUN 23, Potassium 4.2, Sodium 139, EGFR >60 10/04/2016 Creatinine 1.18, BUN 35, Potassium 4.2, Sodium 138, EGFR >60 09/03/2016 Creatinine 1.13, BUN 28, Potassium 3.9, Sodium 138 07/19/2016 Creatinine 1.09, BUN 27, Potassium 4.3, Sodium 138 07/08/2016 Creatinine 1.22, BUN 27, Potassium 3.9, Sodium 142 06/12/2016 Creatinine 1.12, BUN 22, Potassium 4.0, Sodium 140 05/02/2016 Creatinine 1.33, BUN 34, Potassium 4.1, Sodium 136 03/20/2016 Creatinine 1.17, BUN 36, Potassium 4.7, Sodium 136 11/03/2015 Creatinine 1.03, BUN 17, Potassium 4.0, Sodium 140   Recommendations: No changes.    Encouraged to call for fluid symptoms.  Follow-up plan: ICM clinic phone appointment on 04/18/2017.   Advised he overdue to make an appointment for Dr Caryl Comes (last visit 2016) and encouraged him to make an appointment.  Needs to schedule appointments with Dr [name] in Sept and Dr Aundra Dubin in Oct.  Copy of ICM check sent to physician.   3 month ICM trend: 03/18/2017   1 Year ICM trend:      [name], RN 03/18/2017 10:25 AM

## 2017-04-18 ENCOUNTER — Ambulatory Visit (INDEPENDENT_AMBULATORY_CARE_PROVIDER_SITE_OTHER): Payer: Medicaid Other

## 2017-04-18 ENCOUNTER — Telehealth: Payer: Self-pay

## 2017-04-18 DIAGNOSIS — I5042 Chronic combined systolic (congestive) and diastolic (congestive) heart failure: Secondary | ICD-10-CM

## 2017-04-18 DIAGNOSIS — Z95810 Presence of automatic (implantable) cardiac defibrillator: Secondary | ICD-10-CM | POA: Diagnosis not present

## 2017-04-18 NOTE — Telephone Encounter (Signed)
Remote ICM transmission received.  Attempted call to patient and left message to return call. 

## 2017-04-18 NOTE — Progress Notes (Signed)
EPIC Encounter for ICM Monitoring  Patient Name: David Bright is a 56 y.o. male Date: 04/18/2017 Primary Care Physican: Luetta Nutting, DO Primary Cardiologist:Nelson/[facility] HF  Electrophysiologist: Caryl Comes Dry Weight: 282 lbs   Since 18-Mar-2017: VT-NS (>4 beats, >171 bpm) 1     Spoke with patient. He denied any fluid symptoms.  He reported falling this past week and bruised a rib. He went to the ER and did not have a fracture.     Thoracic impedance normal.        Prescribed dosage: Furosemide40 mg 1 tablet twice a day. Potassium 20 mEq 1 tablet daily.   LABS: 01/24/2017 Creatinine 1.16, BUN 27, Potassium 5.2, Sodium 140, Care Everywhere results 01/08/2017 Creatinine 1.20, BUN 29, Potassium 4.5, Sodium 138, EGFR >60 12/27/2016 Creatinine 0.98, BUN 18, Potassium 4.5, Sodium 139, EGFR >60 12/02/2016 Creatinine 1.23, BUN 31, Potassium 4.5, Sodium 138, EGFR >60 11/22/2016 Creatinine 1.03, BUN 19, Potassium 4.6, Sodium 136, EGFR >60 10/28/2016 Creatinine 1.48, BUN 48, Potassium 4.3, Sodium 138, EGFR 52-60 10/14/2016 Creatinine 1.19, BUN 23, Potassium 4.2, Sodium 139, EGFR >60 10/04/2016 Creatinine 1.18, BUN 35, Potassium 4.2, Sodium 138, EGFR >60 09/03/2016 Creatinine 1.13, BUN 28, Potassium 3.9, Sodium 138 07/19/2016 Creatinine 1.09, BUN 27, Potassium 4.3, Sodium 138 07/08/2016 Creatinine 1.22, BUN 27, Potassium 3.9, Sodium 142 06/12/2016 Creatinine 1.12, BUN 22, Potassium 4.0, Sodium 140 05/02/2016 Creatinine 1.33, BUN 34, Potassium 4.1, Sodium 136 03/20/2016 Creatinine 1.17, BUN 36, Potassium 4.7, Sodium 136 11/03/2015 Creatinine 1.03, BUN 17, Potassium 4.0, Sodium 140  Recommendations: No changes today.  Encouraged to call for any fluid symptoms.  Follow-up plan: ICM clinic phone appointment on 05/19/2017.  Office appointment scheduled 06/19/2017 with Dr. Caryl Comes.  Copy of ICM check sent to Dr. Caryl Comes.   3 month ICM trend: 04/18/2017   1 Year ICM trend:      [name], RN 04/18/2017 7:57 AM

## 2017-05-19 ENCOUNTER — Telehealth: Payer: Self-pay

## 2017-05-19 ENCOUNTER — Ambulatory Visit (INDEPENDENT_AMBULATORY_CARE_PROVIDER_SITE_OTHER): Payer: Medicaid Other | Admitting: *Deleted

## 2017-05-19 ENCOUNTER — Telehealth: Payer: Self-pay | Admitting: Cardiology

## 2017-05-19 DIAGNOSIS — I428 Other cardiomyopathies: Secondary | ICD-10-CM

## 2017-05-19 DIAGNOSIS — Z95810 Presence of automatic (implantable) cardiac defibrillator: Secondary | ICD-10-CM | POA: Diagnosis not present

## 2017-05-19 DIAGNOSIS — I5042 Chronic combined systolic (congestive) and diastolic (congestive) heart failure: Secondary | ICD-10-CM

## 2017-05-19 NOTE — Telephone Encounter (Signed)
LMOVM reminding pt to send remote transmission.   

## 2017-05-19 NOTE — Progress Notes (Signed)
EPIC Encounter for ICM Monitoring  Patient Name: David Bright is a 56 y.o. male Date: 05/19/2017 Primary Care Physican: Luetta Nutting, DO Primary Cardiologist:[facility] HF  Electrophysiologist: Caryl Comes Dry Weight: Previous weight 282 lbs        Attempted call to patient and unable to reach.  Left detailed message regarding transmission.  Transmission reviewed.    Thoracic impedance normal today but was abnormal days suggesting fluid accumulation within the last 10 days.        Prescribed dosage: Furosemide40 mg 1 tablet twice a day. Potassium 20 mEq 1 tablet daily.   LABS: 01/24/2017 Creatinine 1.16, BUN 27, Potassium 5.2, Sodium 140, Care Everywhere results 01/08/2017 Creatinine 1.20, BUN 29, Potassium 4.5, Sodium 138, EGFR >60 12/27/2016 Creatinine 0.98, BUN 18, Potassium 4.5, Sodium 139, EGFR >60 12/02/2016 Creatinine 1.23, BUN 31, Potassium 4.5, Sodium 138, EGFR >60 11/22/2016 Creatinine 1.03, BUN 19, Potassium 4.6, Sodium 136, EGFR >60 10/28/2016 Creatinine 1.48, BUN 48, Potassium 4.3, Sodium 138, EGFR 52-60 10/14/2016 Creatinine 1.19, BUN 23, Potassium 4.2, Sodium 139, EGFR >60 10/04/2016 Creatinine 1.18, BUN 35, Potassium 4.2, Sodium 138, EGFR >60 09/03/2016 Creatinine 1.13, BUN 28, Potassium 3.9, Sodium 138 07/19/2016 Creatinine 1.09, BUN 27, Potassium 4.3, Sodium 138 07/08/2016 Creatinine 1.22, BUN 27, Potassium 3.9, Sodium 142 06/12/2016 Creatinine 1.12, BUN 22, Potassium 4.0, Sodium 140 05/02/2016 Creatinine 1.33, BUN 34, Potassium 4.1, Sodium 136 03/20/2016 Creatinine 1.17, BUN 36, Potassium 4.7, Sodium 136 11/03/2015 Creatinine 1.03, BUN 17, Potassium 4.0, Sodium 140  Recommendations: Left voice mail with ICM number and encouraged to call if experiencing any fluid symptoms.  Follow-up plan: ICM clinic phone appointment on 07/22/2017.  Office appointment scheduled 06/19/2017 with Dr. Caryl Comes.  Copy of ICM check sent to Dr. Caryl Comes.   3 month ICM trend:  05/19/2017   1 Year ICM trend:      [name], RN 05/19/2017 12:37 PM

## 2017-05-19 NOTE — Telephone Encounter (Signed)
Remote ICM transmission received.  Attempted call to patient and left detailed message regarding transmission and next ICM scheduled for 07/22/2017 and office appointment with Dr Caryl Comes 06/19/2017.  Advised to return call for any fluid symptoms or questions.

## 2017-05-20 LAB — CUP PACEART REMOTE DEVICE CHECK
Battery Remaining Longevity: 124 mo
Brady Statistic RV Percent Paced: 0 %
HIGH POWER IMPEDANCE MEASURED VALUE: 70 Ohm
Implantable Lead Implant Date: 20160725
Implantable Lead Model: 293
Implantable Lead Serial Number: 383593
Lead Channel Impedance Value: 380 Ohm
Lead Channel Impedance Value: 380 Ohm
Lead Channel Pacing Threshold Amplitude: 0.875 V
Lead Channel Sensing Intrinsic Amplitude: 10.125 mV
Lead Channel Setting Pacing Pulse Width: 0.4 ms
Lead Channel Setting Sensing Sensitivity: 0.3 mV
MDC IDC LEAD LOCATION: 753860
MDC IDC MSMT BATTERY VOLTAGE: 3.02 V
MDC IDC MSMT LEADCHNL RV PACING THRESHOLD PULSEWIDTH: 0.4 ms
MDC IDC MSMT LEADCHNL RV SENSING INTR AMPL: 10.125 mV
MDC IDC PG IMPLANT DT: 20160725
MDC IDC SESS DTM: 20181008073322
MDC IDC SET LEADCHNL RV PACING AMPLITUDE: 2.5 V

## 2017-05-20 NOTE — Progress Notes (Signed)
Remote ICD transmission.   

## 2017-05-23 ENCOUNTER — Encounter: Payer: Self-pay | Admitting: Cardiology

## 2017-06-03 ENCOUNTER — Encounter: Payer: Self-pay | Admitting: Internal Medicine

## 2017-06-17 ENCOUNTER — Encounter (HOSPITAL_COMMUNITY): Payer: Self-pay | Admitting: *Deleted

## 2017-06-17 NOTE — Progress Notes (Signed)
Received medical record request from [facility].  Requested records faxed today to [phone].  Original request will be scanned to patient's electronic medical record.

## 2017-06-19 ENCOUNTER — Encounter: Payer: Self-pay | Admitting: Internal Medicine

## 2017-06-19 ENCOUNTER — Ambulatory Visit: Payer: Medicaid Other | Admitting: Internal Medicine

## 2017-06-19 VITALS — BP 98/60 | HR 95 | Ht 72.0 in | Wt 283.0 lb

## 2017-06-19 DIAGNOSIS — Z95810 Presence of automatic (implantable) cardiac defibrillator: Secondary | ICD-10-CM

## 2017-06-19 DIAGNOSIS — I428 Other cardiomyopathies: Secondary | ICD-10-CM

## 2017-06-19 DIAGNOSIS — I5042 Chronic combined systolic (congestive) and diastolic (congestive) heart failure: Secondary | ICD-10-CM

## 2017-06-19 LAB — CUP PACEART INCLINIC DEVICE CHECK
Battery Remaining Longevity: 124 mo
Brady Statistic RV Percent Paced: 0.01 %
Date Time Interrogation Session: 20181108165618
HIGH POWER IMPEDANCE MEASURED VALUE: 71 Ohm
Implantable Lead Model: 293
Implantable Lead Serial Number: 383593
Lead Channel Impedance Value: 399 Ohm
Lead Channel Impedance Value: 399 Ohm
Lead Channel Sensing Intrinsic Amplitude: 11.5 mV
Lead Channel Sensing Intrinsic Amplitude: 12.5 mV
Lead Channel Setting Sensing Sensitivity: 0.3 mV
MDC IDC LEAD IMPLANT DT: 20160725
MDC IDC LEAD LOCATION: 753860
MDC IDC MSMT BATTERY VOLTAGE: 3.02 V
MDC IDC MSMT LEADCHNL RV PACING THRESHOLD AMPLITUDE: 0.875 V
MDC IDC MSMT LEADCHNL RV PACING THRESHOLD PULSEWIDTH: 0.4 ms
MDC IDC PG IMPLANT DT: 20160725
MDC IDC SET LEADCHNL RV PACING AMPLITUDE: 2.5 V
MDC IDC SET LEADCHNL RV PACING PULSEWIDTH: 0.4 ms

## 2017-06-19 NOTE — Progress Notes (Signed)
Patient Care Team: Luetta Nutting, DO as PCP - General (Family Medicine) Verl Blalock, Marijo Conception, MD (Inactive) (Cardiology) Sharmon Revere as Physician Assistant (Physician Assistant)   HPI  David Bright is a 56 y.o. male Seen in follow-up for ICD implanted 7/16 for primary prevention in the setting of nonischemic cardiomyopathy congestive heart failure and history of polysubstance abuse.  No edema,  Chronic sob  No chest pain   Date Cr K CBC  5/18 1.2 4.5   10/18  1.27 4.9    Had colonoscopy with biopsy.  I cannot find a CBC in EPIC Care Everywhere    Past Medical History:  Diagnosis Date  . Chronic combined systolic and diastolic CHF (congestive heart failure) (HCC)    Systolic and diastolic  . COPD (chronic obstructive pulmonary disease) ([place])   . Hyperlipidemia   . Hypertension   . Non compliance with medical treatment   . Non-ischemic cardiomyopathy ([place])    a. Normal cath 2002;  b. 01/2015 Echo: EF 15-20%, mod conc LVH, restrictive physiology, mod MR, sev dil LA, triv TR/PI;  c. 02/2015 Cath: LM nl, LAD 30d, LCX nl, OM1 small, nl, RCA nl;  d. 02/2015 s/p MDT single lead AICD (ser# [id number]).  . OSA (obstructive sleep apnea) 06/04/2015   Mild with AHI 12.3/hr and AHI 24.5/hr in REM sleep.  . Polysubstance abuse ([place])    History of quitting alcohol, marijuana and crack over 12 years ago  . Type II diabetes mellitus ([place])     Past Surgical History:  Procedure Laterality Date  . none      Current Outpatient Medications  Medication Sig Dispense Refill  . aspirin EC 81 MG tablet Take 1 tablet (81 mg total) by mouth daily. 30 tablet 0  . atorvastatin (LIPITOR) 20 MG tablet Take 1 tablet by mouth daily.    . canagliflozin (INVOKANA) 300 MG TABS tablet Take 300 mg by mouth daily before breakfast.    . carvedilol (COREG) 12.5 MG tablet TAKE ONE TABLET BY MOUTH TWICE DAILY WITH MEALS 180 tablet 2  . CORLANOR 5 MG TABS tablet TAKE ONE TABLET BY MOUTH TWICE DAILY  WITH MEALS 60 tablet 10  . isosorbide-hydrALAZINE (BIDIL) 20-37.5 MG tablet Take 1 tablet by mouth 3 (three) times daily. 90 tablet 6  . mesalamine (LIALDA) 1.2 g EC tablet Take 1.2 g 2 (two) times daily by mouth.    . potassium chloride SA (K-DUR,KLOR-CON) 20 MEQ tablet Take 1 tablet (20 mEq total) by mouth daily. 90 tablet 3  . sacubitril-valsartan (ENTRESTO) 97-103 MG Take 1 tablet by mouth 2 (two) times daily. 60 tablet 6  . sildenafil (REVATIO) 20 MG tablet Take 20 mg by mouth. Pt reported 12/23/16 he takes 2-5 tablets prn for sexual activity.    . sitaGLIPtin-metformin (JANUMET) 50-1000 MG tablet Take 1 tablet by mouth 2 (two) times daily.    Marland Kitchen spironolactone (ALDACTONE) 25 MG tablet TAKE ONE TABLET BY MOUTH ONCE DAILY 90 tablet 2  . varenicline (CHANTIX PAK) 0.5 MG X 11 & 1 MG X 42 tablet Take by mouth 2 (two) times daily. Take one 0.5 mg tablet by mouth once daily for 3 days, then increase to one 0.5 mg tablet twice daily for 4 days, then increase to one 1 mg tablet twice daily.    . furosemide (LASIX) 40 MG tablet Take 1 tablet (40 mg total) by mouth 2 (two) times daily. 180 tablet 3   No current  facility-administered medications for this visit.     No Known Allergies    Review of Systems negative except from HPI and PMH  Physical Exam BP 98/60   Pulse 95   Ht 6' (1.829 m)   Wt 283 lb (128.4 kg)   SpO2 97%   BMI 38.38 kg/m  Well developed and nourished in no acute distress HENT normal Neck supple with JVP-flat Carotids brisk and full without bruits Clear Regular rate and rhythm, no murmurs or gallops Abd-soft with active BS without hepatomegaly No Clubbing cyanosis edema Skin-warm and dry A & Oriented  Grossly normal sensory and motor function   ECG demonstrates sinus rhythm at sinus 91 17/11/38  Assessment and  Plan  Obstructive sleep apnea- treated  Implantable defibrillator The patient's device was interrogated.  The information was reviewed. No changes were  made in the programming.     Cardiomyopathy presumed nonischemic with some regional wall motion abnormalities ejection fraction 15-20%  Right axis deviation/biatrial enlargement   Congestive heart failure-chronic-systolic  GI distress   Euvolemic continue current meds  Will check CBC   Will have him take a 2 week break from corlanor to see impact on GI symptoms   Tolerating meds and surveillance labs within range   We spent more than 50% of our >25 min visit in face to face counseling regarding the above

## 2017-06-19 NOTE — Patient Instructions (Addendum)
Medication Instructions:  Your physician recommends that you continue on your current medications as directed. Please refer to the Current Medication list given to you today.  Labwork: CBC will be drawn today.  Testing/Procedures: None ordered.  Follow-Up:  Please schedule an appointment with the heart failure clinic.  Your physician wants you to follow-up in: one year with Dr. Caryl Comes.   You will receive a reminder letter in the mail two months in advance. If you don't receive a letter, please call our office to schedule the follow-up appointment.  Remote monitoring is used to monitor your ICD from home. This monitoring reduces the number of office visits required to check your device to one time per year. It allows [place] to keep an eye on the functioning of your device to ensure it is working properly. You are scheduled for a device check from home on 07/22/2017 and 08/18/2017. You may send your transmission at any time that day. If you have a wireless device, the transmission will be sent automatically. After your physician reviews your transmission, you will receive a postcard with your next transmission date.    Any Other Special Instructions Will Be Listed Below (If Applicable).     If you need a refill on your cardiac medications before your next appointment, please call your pharmacy.

## 2017-06-20 LAB — CBC WITH DIFFERENTIAL/PLATELET
BASOS: 0 %
Basophils Absolute: 0 10*3/uL (ref 0.0–0.2)
EOS (ABSOLUTE): 0.1 10*3/uL (ref 0.0–0.4)
Eos: 1 %
HEMOGLOBIN: 13.7 g/dL (ref 13.0–17.7)
Hematocrit: 39.9 % (ref 37.5–51.0)
IMMATURE GRANS (ABS): 0.1 10*3/uL (ref 0.0–0.1)
IMMATURE GRANULOCYTES: 1 %
LYMPHS: 20 %
Lymphocytes Absolute: 1.4 10*3/uL (ref 0.7–3.1)
MCH: 30.8 pg (ref 26.6–33.0)
MCHC: 34.3 g/dL (ref 31.5–35.7)
MCV: 90 fL (ref 79–97)
MONOCYTES: 7 %
Monocytes Absolute: 0.5 10*3/uL (ref 0.1–0.9)
NEUTROS PCT: 71 %
Neutrophils Absolute: 4.9 10*3/uL (ref 1.4–7.0)
PLATELETS: 263 10*3/uL (ref 150–379)
RBC: 4.45 x10E6/uL (ref 4.14–5.80)
RDW: 14 % (ref 12.3–15.4)
WBC: 7 10*3/uL (ref 3.4–10.8)

## 2017-06-23 ENCOUNTER — Telehealth: Payer: Self-pay

## 2017-06-23 NOTE — Telephone Encounter (Signed)
-----   Message from Deboraha Sprang, MD sent at 06/22/2017  5:19 PM EST ----- Please Inform Patient that labs are normal x   Thanks

## 2017-06-23 NOTE — Telephone Encounter (Signed)
lmtcb for lab results

## 2017-06-25 NOTE — Telephone Encounter (Signed)
Follow Up:     Pt returning your call from 06-23-17.

## 2017-06-26 NOTE — Telephone Encounter (Signed)
Pt it aware and agreeable to normal results

## 2017-07-22 ENCOUNTER — Ambulatory Visit (INDEPENDENT_AMBULATORY_CARE_PROVIDER_SITE_OTHER): Payer: Medicaid Other

## 2017-07-22 DIAGNOSIS — Z95810 Presence of automatic (implantable) cardiac defibrillator: Secondary | ICD-10-CM

## 2017-07-22 DIAGNOSIS — I5042 Chronic combined systolic (congestive) and diastolic (congestive) heart failure: Secondary | ICD-10-CM | POA: Diagnosis not present

## 2017-07-25 NOTE — Progress Notes (Signed)
EPIC Encounter for ICM Monitoring  Patient Name: David Bright is a 56 y.o. male Date: 07/25/2017 Primary Care Physican: Luetta Nutting, DO Primary Cardiologist:Nelson/[facility] HF  Electrophysiologist: Caryl Comes Dry Weight: Previous weight 282lbs      Transmission reviewed   Thoracic impedance normal.        Prescribed dosage: Furosemide40 mg 1 tablet twice a day. Potassium 20 mEq 1 tablet daily.   LABS: 01/24/2017 Creatinine 1.16, BUN 27, Potassium 5.2, Sodium 140, Care Everywhere results 01/08/2017 Creatinine 1.20, BUN 29, Potassium 4.5, Sodium 138, EGFR >60 12/27/2016 Creatinine 0.98, BUN 18, Potassium 4.5, Sodium 139, EGFR >60 12/02/2016 Creatinine 1.23, BUN 31, Potassium 4.5, Sodium 138, EGFR >60 11/22/2016 Creatinine 1.03, BUN 19, Potassium 4.6, Sodium 136, EGFR >60 10/28/2016 Creatinine 1.48, BUN 48, Potassium 4.3, Sodium 138, EGFR 52-60 10/14/2016 Creatinine 1.19, BUN 23, Potassium 4.2, Sodium 139, EGFR >60 10/04/2016 Creatinine 1.18, BUN 35, Potassium 4.2, Sodium 138, EGFR >60 09/03/2016 Creatinine 1.13, BUN 28, Potassium 3.9, Sodium 138 07/19/2016 Creatinine 1.09, BUN 27, Potassium 4.3, Sodium 138 07/08/2016 Creatinine 1.22, BUN 27, Potassium 3.9, Sodium 142 06/12/2016 Creatinine 1.12, BUN 22, Potassium 4.0, Sodium 140 05/02/2016 Creatinine 1.33, BUN 34, Potassium 4.1, Sodium 136 03/20/2016 Creatinine 1.17, BUN 36, Potassium 4.7, Sodium 136 11/03/2015 Creatinine 1.03, BUN 17, Potassium 4.0, Sodium 140  Recommendations: No changes.    Follow-up plan: ICM clinic phone appointment on 08/26/2017.  Office appointment scheduled 08/19/2017 with Dr. Aundra Dubin.  Copy of ICM check sent to Dr. Caryl Comes.   3 month ICM trend: 07/22/2017    1 Year ICM trend:       [name], RN 07/25/2017 5:42 PM

## 2017-08-19 ENCOUNTER — Encounter (HOSPITAL_COMMUNITY): Payer: Self-pay | Admitting: Cardiology

## 2017-08-19 ENCOUNTER — Ambulatory Visit (HOSPITAL_COMMUNITY)
Admission: RE | Admit: 2017-08-19 | Discharge: 2017-08-19 | Disposition: A | Discharge status: Home / Self Care | Payer: Medicaid Other | Source: Ambulatory Visit | Attending: Cardiology | Admitting: Cardiology

## 2017-08-19 ENCOUNTER — Other Ambulatory Visit: Payer: Self-pay

## 2017-08-19 VITALS — BP 121/64 | HR 82 | Wt 286.5 lb

## 2017-08-19 DIAGNOSIS — Z79899 Other long term (current) drug therapy: Secondary | ICD-10-CM | POA: Insufficient documentation

## 2017-08-19 DIAGNOSIS — Z7984 Long term (current) use of oral hypoglycemic drugs: Secondary | ICD-10-CM | POA: Diagnosis not present

## 2017-08-19 DIAGNOSIS — Z95810 Presence of automatic (implantable) cardiac defibrillator: Secondary | ICD-10-CM | POA: Diagnosis not present

## 2017-08-19 DIAGNOSIS — Z841 Family history of disorders of kidney and ureter: Secondary | ICD-10-CM | POA: Insufficient documentation

## 2017-08-19 DIAGNOSIS — Z9889 Other specified postprocedural states: Secondary | ICD-10-CM | POA: Insufficient documentation

## 2017-08-19 DIAGNOSIS — E785 Hyperlipidemia, unspecified: Secondary | ICD-10-CM

## 2017-08-19 DIAGNOSIS — Z7982 Long term (current) use of aspirin: Secondary | ICD-10-CM | POA: Diagnosis not present

## 2017-08-19 DIAGNOSIS — I5022 Chronic systolic (congestive) heart failure: Secondary | ICD-10-CM | POA: Diagnosis present

## 2017-08-19 DIAGNOSIS — I5042 Chronic combined systolic (congestive) and diastolic (congestive) heart failure: Secondary | ICD-10-CM | POA: Diagnosis not present

## 2017-08-19 DIAGNOSIS — Z8249 Family history of ischemic heart disease and other diseases of the circulatory system: Secondary | ICD-10-CM | POA: Diagnosis not present

## 2017-08-19 DIAGNOSIS — G4733 Obstructive sleep apnea (adult) (pediatric): Secondary | ICD-10-CM | POA: Diagnosis not present

## 2017-08-19 DIAGNOSIS — F17210 Nicotine dependence, cigarettes, uncomplicated: Secondary | ICD-10-CM | POA: Diagnosis not present

## 2017-08-19 DIAGNOSIS — E1142 Type 2 diabetes mellitus with diabetic polyneuropathy: Secondary | ICD-10-CM | POA: Diagnosis not present

## 2017-08-19 DIAGNOSIS — J449 Chronic obstructive pulmonary disease, unspecified: Secondary | ICD-10-CM | POA: Diagnosis not present

## 2017-08-19 DIAGNOSIS — I110 Hypertensive heart disease with heart failure: Secondary | ICD-10-CM | POA: Diagnosis not present

## 2017-08-19 DIAGNOSIS — E1151 Type 2 diabetes mellitus with diabetic peripheral angiopathy without gangrene: Secondary | ICD-10-CM | POA: Diagnosis not present

## 2017-08-19 DIAGNOSIS — I429 Cardiomyopathy, unspecified: Secondary | ICD-10-CM | POA: Insufficient documentation

## 2017-08-19 DIAGNOSIS — I739 Peripheral vascular disease, unspecified: Secondary | ICD-10-CM | POA: Diagnosis not present

## 2017-08-19 LAB — BASIC METABOLIC PANEL
Anion gap: 10 (ref 5–15)
BUN: 16 mg/dL (ref 6–20)
CALCIUM: 8.9 mg/dL (ref 8.9–10.3)
CO2: 22 mmol/L (ref 22–32)
Chloride: 105 mmol/L (ref 101–111)
Creatinine, Ser: 1.16 mg/dL (ref 0.61–1.24)
GFR calc Af Amer: >60 mL/min (ref >60)
GFR calc non Af Amer: >60 mL/min (ref >60)
GLUCOSE: 137 mg/dL — AB (ref 65–99)
Potassium: 4.2 mmol/L (ref 3.5–5.1)
Sodium: 137 mmol/L (ref 135–145)

## 2017-08-19 LAB — LIPID PANEL
Cholesterol: 137 mg/dL (ref 0–200)
HDL: 39 mg/dL — AB (ref >40)
LDL CALC: 69 mg/dL (ref 0–99)
Total CHOL/HDL Ratio: 3.5 RATIO
Triglycerides: 143 mg/dL (ref <150)
VLDL: 29 mg/dL (ref 0–40)

## 2017-08-19 MED ORDER — CARVEDILOL 12.5 MG PO TABS: 18.7500 mg | ORAL_TABLET | Freq: Two times a day (BID) | ORAL | 3 refills | Status: DC

## 2017-08-19 NOTE — Progress Notes (Signed)
PCP: Scherrie Gerlach HF Cardiology: Dr. Aundra Dubin  57 yo with history of chronic systolic CHF and COPD/active smoking presents for followup of CHF.  He has a known nonischemic cardiomyopathy, echo in 1/17 with EF 20% and a severely dilated LV.  No coronary disease on 7/16 cardiac cath.  Repeat echo was 2/18, showing EF up to 40%.   He is trying stop smoking with Chantix, down to 2-3 cigs/day. Still with leg fatigue/cramping after walking 100 yards.  He had significant PAD by peripheral arterial dopplers, saw Dr. Gwenlyn Found with plan of medical management for now. He also has knee pain which is limiting.   Weight is up 2 lbs.  He is using CPAP.  He is not particularly active due to knee pain and claudication but denies exertional dyspnea with what he does.  No chest pain.  No orthopnea/PND.  He has been having neuropathic pain in his left arm, PCP is working up.    Medtronic device interrogation: Stable thoracic impedance, no VT.   Labs (12/17): K 4.3, creatinine 1.09 Labs (1/18): K 3.9, creatinine 1.13 Labs (3/18): K 4.3, creatinine 1.48, NT-proBNP 61 Labs (4/18): K 4.5, creatinine 1.23, LDL 71, BNP 34 Labs (5/18): K 4.5, creatinine 1.2  PMH:  1. Chronic systolic CHF: Nonischemic cardiomyopathy.  Has Medtronic ICD.   - LHC (7/16) with nonobstructive disease.  - Echo (1/17) with EF 20%, severe LV dilation, mild to moderate MR.  - Echo (2/18): EF 40%, mild LV dilation.  2. COPD: Active smoker.  - PFTs (2/18) with minimal obstruction, mild restriction suggesting a parenchymal process, low DLCO.  - CT chest (6/18): No ILD, +emphysema, lung nodules.  3. Type II diabetes with peripheral neuropathy.  4. OSA on CPAP 5. HTN 6. Hyperlipidemia 7. PAD: ABIs abnormal at outside office.  - Peripheral arterial dopplers (3/18) with > 50% left CIA and bilateral EIA stenosis, 50-74% mid LSFA stenosis.   SH: Lives in [place], works at [employer] and [employer], smokes about 2-3 cigs/day now.  Rare ETOH.  Remote substance  abuse.   FH: Aunt with CHF, brother with ESRD.    ROS: All systems reviewed and negative except as per HPI.  Current Outpatient Medications  Medication Sig Dispense Refill  . aspirin EC 81 MG tablet Take 1 tablet (81 mg total) by mouth daily. 30 tablet 0  . atorvastatin (LIPITOR) 20 MG tablet Take 1 tablet by mouth daily.    . canagliflozin (INVOKANA) 300 MG TABS tablet Take 300 mg by mouth daily before breakfast.    . carvedilol (COREG) 12.5 MG tablet Take 1.5 tablets (18.75 mg total) by mouth 2 (two) times daily with a meal. 90 tablet 3  . CORLANOR 5 MG TABS tablet TAKE ONE TABLET BY MOUTH TWICE DAILY WITH MEALS 60 tablet 10  . furosemide (LASIX) 40 MG tablet Take 1 tablet (40 mg total) by mouth 2 (two) times daily. 180 tablet 3  . isosorbide-hydrALAZINE (BIDIL) 20-37.5 MG tablet Take 1 tablet by mouth 3 (three) times daily. 90 tablet 6  . mesalamine (LIALDA) 1.2 g EC tablet Take 1.2 g 2 (two) times daily by mouth.    . potassium chloride SA (K-DUR,KLOR-CON) 20 MEQ tablet Take 1 tablet (20 mEq total) by mouth daily. 90 tablet 3  . sacubitril-valsartan (ENTRESTO) 97-103 MG Take 1 tablet by mouth 2 (two) times daily. 60 tablet 6  . sildenafil (REVATIO) 20 MG tablet Take 20 mg by mouth. Pt reported 12/23/16 he takes 2-5 tablets prn for  sexual activity.    . sitaGLIPtin-metformin (JANUMET) 50-1000 MG tablet Take 1 tablet by mouth 2 (two) times daily.    Marland Kitchen spironolactone (ALDACTONE) 25 MG tablet TAKE ONE TABLET BY MOUTH ONCE DAILY 90 tablet 2  . varenicline (CHANTIX PAK) 0.5 MG X 11 & 1 MG X 42 tablet Take by mouth 2 (two) times daily. Take one 0.5 mg tablet by mouth once daily for 3 days, then increase to one 0.5 mg tablet twice daily for 4 days, then increase to one 1 mg tablet twice daily.     No current facility-administered medications for this encounter.    BP 121/64   Pulse 82   Wt 286 lb 8 oz (130 kg)   SpO2 99%   BMI 38.86 kg/m  General: NAD Neck: No JVD, no thyromegaly or  thyroid nodule.  Lungs: Clear to auscultation bilaterally with normal respiratory effort. CV: Nondisplaced PMI.  Heart regular S1/S2, no S3/S4, no murmur.  No peripheral edema.  No carotid bruit.  Unable to palpate pedal pulses.  Abdomen: Soft, nontender, no hepatosplenomegaly, no distention.  Skin: Intact without lesions or rashes.  Neurologic: Alert and oriented x 3.  Psych: Normal affect. Extremities: No clubbing or cyanosis.  HEENT: Normal.    Assessment/Plan: 1. Chronic systolic CHF: Nonischemic cardiomyopathy => ?due to viral myocarditis or prior substance abuse.  Medtronic ICD.  Echo 1/17 with severely dilated LV and EF 20%, EF up to 40% on echo 2/18.  NYHA class II-III symptoms.  He does not look volume overloaded on exam or by Optivol. - Continue Lasix 40 mg bid. BMET today.  - Continue Entresto 97/103 bid,.  - Continue current ivabradine and spironolactone.  - Increase Coreg to 18.75 mg bid.  - Continue Bidil 1 tab tid. - Narrow QRS, no indication for CRT upgrade.  - He is due fo repeat echo, will arrange.   - Followup in 1 month to reassess volume status.  - He wants to do his ICD followup with EP at [facility].  This sounds fine, I will arrange.  2. Smoking/suspect COPD: He is using Chantix to try to quit smoking, cutting back slowly.   PFTs done, somewhat concerning for interstitial lung disease rather than emphysema with restrictive PFTs and decreased DLCO => no ILD on CT, emphysema noted along with lung nodules.  - Repeat CT chest in 6/19 to follow lung nodules.   3. OSA: Continue CPAP.  4. PAD: Significant disease on peripheral dopplers, stable claudication after about 100 yards.  He saw Dr. Gwenlyn Found, medical management recommended.  I encouraged him to try to walk at least a short distance through the pain.  - Continue ASA 81 and statin. Check lipids today.  - He needs PV followup by Dr. Gwenlyn Found in 3/18 with ABIs.   Followup in 3 months.   Loralie Champagne 08/19/2017

## 2017-08-19 NOTE — Patient Instructions (Signed)
Increase Carvedilol 18.75 mg (1.5 tab), twice a day  Labs drawn today (if we do not call you, then your lab work was stable)   You have been referred to Dr. Curt Bears in [facility] (they will call you)   Make an appointment with Dr. Gwenlyn Found to get ABI's   Your physician has requested that you have an echocardiogram. Echocardiography is a painless test that uses sound waves to create images of your heart. It provides your doctor with information about the size and shape of your heart and how well your heart's chambers and valves are working. This procedure takes approximately one hour. There are no restrictions for this procedure.  Your physician recommends that you schedule a follow-up appointment in: 2 months with Dr. Aundra Dubin   an a echocardiogram

## 2017-08-20 ENCOUNTER — Encounter (HOSPITAL_COMMUNITY): Payer: Self-pay

## 2017-08-20 ENCOUNTER — Telehealth (HOSPITAL_COMMUNITY): Payer: Self-pay | Admitting: Cardiology

## 2017-08-20 NOTE — Telephone Encounter (Signed)
User: Cherie Dark A Date/time: 08/20/17 10:50 AM  Comment: Called pt and lmsg for him to CB to sch echo in Feb.  Context:  Outcome: Left Message  Phone number: [phone] Phone Type: Home Phone  Comm. type: Telephone Call type: Outgoing  Contact: [name] Relation to patient: Self

## 2017-08-26 ENCOUNTER — Ambulatory Visit (INDEPENDENT_AMBULATORY_CARE_PROVIDER_SITE_OTHER): Payer: Medicaid Other | Admitting: *Deleted

## 2017-08-26 DIAGNOSIS — I428 Other cardiomyopathies: Secondary | ICD-10-CM

## 2017-08-26 DIAGNOSIS — I5042 Chronic combined systolic (congestive) and diastolic (congestive) heart failure: Secondary | ICD-10-CM

## 2017-08-26 DIAGNOSIS — Z95810 Presence of automatic (implantable) cardiac defibrillator: Secondary | ICD-10-CM

## 2017-08-28 ENCOUNTER — Telehealth: Payer: Self-pay

## 2017-08-28 NOTE — Progress Notes (Signed)
EPIC Encounter for ICM Monitoring  Patient Name: David Bright is a 57 y.o. male Date: 08/28/2017 Primary Care Physican: Luetta Nutting, DO Primary Cardiologist:[facility]  Electrophysiologist: Caryl Comes Dry Weight: 286lbs(office weight 08/19/17)                                                    Attempted call to patient and unable to reach.  Left detailed message regarding transmission.  Transmission reviewed.    Thoracic impedance normal.        Prescribed dosage: Furosemide40 mg 1 tablet twice a day. Potassium 20 mEq 1 tablet daily.   LABS: 08/19/2017 Creatinine 1.16, BUN 16, Potassium 4.2, Sodium 137, EGFR >60 01/24/2017 Creatinine 1.16, BUN 27, Potassium 5.2, Sodium 140, Care Everywhere results 01/08/2017 Creatinine 1.20, BUN 29, Potassium 4.5, Sodium 138, EGFR >60 12/27/2016 Creatinine 0.98, BUN 18, Potassium 4.5, Sodium 139, EGFR >60 12/02/2016 Creatinine 1.23, BUN 31, Potassium 4.5, Sodium 138, EGFR >60 11/22/2016 Creatinine 1.03, BUN 19, Potassium 4.6, Sodium 136, EGFR >60 10/28/2016 Creatinine 1.48, BUN 48, Potassium 4.3, Sodium 138, EGFR 52-60 10/14/2016 Creatinine 1.19, BUN 23, Potassium 4.2, Sodium 139, EGFR >60 10/04/2016 Creatinine 1.18, BUN 35, Potassium 4.2, Sodium 138, EGFR >60 09/03/2016 Creatinine 1.13, BUN 28, Potassium 3.9, Sodium 138 07/19/2016 Creatinine 1.09, BUN 27, Potassium 4.3, Sodium 138 07/08/2016 Creatinine 1.22, BUN 27, Potassium 3.9, Sodium 142 06/12/2016 Creatinine 1.12, BUN 22, Potassium 4.0, Sodium 140 05/02/2016 Creatinine 1.33, BUN 34, Potassium 4.1, Sodium 136 03/20/2016 Creatinine 1.17, BUN 36, Potassium 4.7, Sodium 136 11/03/2015 Creatinine 1.03, BUN 17, Potassium 4.0, Sodium 140  Recommendations: Left voice mail with ICM number and encouraged to call if experiencing any fluid symptoms.  Follow-up plan: ICM clinic phone appointment on 09/26/2017.   Copy of ICM check sent to Dr. Caryl Comes.   3 month ICM trend: 08/26/2017    1  Year ICM trend:       [name], RN 08/28/2017 3:29 PM

## 2017-08-28 NOTE — Progress Notes (Signed)
Remote ICD transmission.   

## 2017-08-28 NOTE — Telephone Encounter (Signed)
Remote ICM transmission received.  Attempted call to patient and left detailed message per DPR regarding transmission and next ICM scheduled for 09/26/2017.  Advised to return call for any fluid symptoms or questions.   

## 2017-08-29 ENCOUNTER — Encounter: Payer: Self-pay | Admitting: Cardiology

## 2017-08-29 ENCOUNTER — Other Ambulatory Visit: Payer: Self-pay

## 2017-08-29 DIAGNOSIS — I5042 Chronic combined systolic (congestive) and diastolic (congestive) heart failure: Secondary | ICD-10-CM

## 2017-08-29 MED ORDER — FUROSEMIDE 40 MG PO TABS: 40.0000 mg | ORAL_TABLET | Freq: Two times a day (BID) | ORAL | 0 refills | Status: DC

## 2017-08-31 LAB — CUP PACEART REMOTE DEVICE CHECK
Battery Remaining Longevity: 122 mo
Battery Voltage: 3.02 V
Brady Statistic RV Percent Paced: 0.01 %
Date Time Interrogation Session: 20190115092207
HighPow Impedance: 67 Ohm
Implantable Pulse Generator Implant Date: 20160725
Lead Channel Impedance Value: 342 Ohm
Lead Channel Pacing Threshold Amplitude: 0.875 V
Lead Channel Pacing Threshold Pulse Width: 0.4 ms
Lead Channel Sensing Intrinsic Amplitude: 8.375 mV
Lead Channel Setting Pacing Amplitude: 2.5 V
Lead Channel Setting Pacing Pulse Width: 0.4 ms
MDC IDC LEAD IMPLANT DT: 20160725
MDC IDC LEAD LOCATION: 753860
MDC IDC LEAD SERIAL: 383593
MDC IDC MSMT LEADCHNL RV IMPEDANCE VALUE: 342 Ohm
MDC IDC MSMT LEADCHNL RV SENSING INTR AMPL: 8.375 mV
MDC IDC SET LEADCHNL RV SENSING SENSITIVITY: 0.3 mV

## 2017-09-01 ENCOUNTER — Other Ambulatory Visit: Payer: Self-pay | Admitting: Cardiology

## 2017-09-09 ENCOUNTER — Encounter (HOSPITAL_COMMUNITY): Payer: Self-pay

## 2017-09-10 NOTE — Progress Notes (Signed)
[employer] med rec request completed and faxed to provided # [phone] (29 pages total). Copy of request scanned into patient's electronic medical record.  Renee Pain, RN

## 2017-09-15 ENCOUNTER — Other Ambulatory Visit: Payer: Self-pay

## 2017-09-15 ENCOUNTER — Other Ambulatory Visit: Payer: Self-pay | Admitting: Cardiology

## 2017-09-15 ENCOUNTER — Ambulatory Visit (HOSPITAL_BASED_OUTPATIENT_CLINIC_OR_DEPARTMENT_OTHER): Payer: Medicaid Other

## 2017-09-15 ENCOUNTER — Ambulatory Visit (HOSPITAL_COMMUNITY)
Admission: RE | Admit: 2017-09-15 | Discharge: 2017-09-15 | Disposition: A | Discharge status: Home / Self Care | Payer: Medicaid Other | Source: Ambulatory Visit | Attending: Cardiology | Admitting: Cardiology

## 2017-09-15 DIAGNOSIS — I739 Peripheral vascular disease, unspecified: Secondary | ICD-10-CM

## 2017-09-15 DIAGNOSIS — E785 Hyperlipidemia, unspecified: Secondary | ICD-10-CM | POA: Diagnosis not present

## 2017-09-15 DIAGNOSIS — E1151 Type 2 diabetes mellitus with diabetic peripheral angiopathy without gangrene: Secondary | ICD-10-CM | POA: Insufficient documentation

## 2017-09-15 DIAGNOSIS — I5042 Chronic combined systolic (congestive) and diastolic (congestive) heart failure: Secondary | ICD-10-CM | POA: Insufficient documentation

## 2017-09-15 DIAGNOSIS — I429 Cardiomyopathy, unspecified: Secondary | ICD-10-CM | POA: Insufficient documentation

## 2017-09-15 DIAGNOSIS — J449 Chronic obstructive pulmonary disease, unspecified: Secondary | ICD-10-CM | POA: Insufficient documentation

## 2017-09-15 DIAGNOSIS — G4733 Obstructive sleep apnea (adult) (pediatric): Secondary | ICD-10-CM | POA: Insufficient documentation

## 2017-09-26 ENCOUNTER — Telehealth: Payer: Self-pay

## 2017-09-26 ENCOUNTER — Ambulatory Visit (INDEPENDENT_AMBULATORY_CARE_PROVIDER_SITE_OTHER): Payer: Medicaid Other

## 2017-09-26 DIAGNOSIS — Z95810 Presence of automatic (implantable) cardiac defibrillator: Secondary | ICD-10-CM

## 2017-09-26 DIAGNOSIS — I5042 Chronic combined systolic (congestive) and diastolic (congestive) heart failure: Secondary | ICD-10-CM

## 2017-09-26 NOTE — Telephone Encounter (Signed)
Remote ICM transmission received.  Attempted call to patient and left detailed message per DPR regarding transmission and next ICM scheduled for 10/27/2017.  Advised to return call for any fluid symptoms or questions.

## 2017-09-26 NOTE — Progress Notes (Signed)
EPIC Encounter for ICM Monitoring  Patient Name: David Bright is a 57 y.o. male Date: 09/26/2017 Primary Care Physican: Luetta Nutting, DO Primary Cardiologist:[facility]  Electrophysiologist: Caryl Comes Dry Weight: 286lbs(office weight 08/19/17)      Attempted call to patient and unable to reach.  Left detailed message regarding transmission.  Transmission reviewed.    Thoracic impedance normal.         Prescribed dosage: Furosemide40 mg 1 tablet twice a day. Potassium 20 mEq 1 tablet daily.   LABS: 08/19/2017 Creatinine 1.16, BUN 16, Potassium 4.2, Sodium 137, EGFR >60 01/24/2017 Creatinine 1.16, BUN 27, Potassium 5.2, Sodium 140, Care Everywhere results 01/08/2017 Creatinine 1.20, BUN 29, Potassium 4.5, Sodium 138, EGFR >60 12/27/2016 Creatinine 0.98, BUN 18, Potassium 4.5, Sodium 139, EGFR >60 12/02/2016 Creatinine 1.23, BUN 31, Potassium 4.5, Sodium 138, EGFR >60 11/22/2016 Creatinine 1.03, BUN 19, Potassium 4.6, Sodium 136, EGFR >60 10/28/2016 Creatinine 1.48, BUN 48, Potassium 4.3, Sodium 138, EGFR 52-60 10/14/2016 Creatinine 1.19, BUN 23, Potassium 4.2, Sodium 139, EGFR >60 10/04/2016 Creatinine 1.18, BUN 35, Potassium 4.2, Sodium 138, EGFR >60 09/03/2016 Creatinine 1.13, BUN 28, Potassium 3.9, Sodium 138 07/19/2016 Creatinine 1.09, BUN 27, Potassium 4.3, Sodium 138 07/08/2016 Creatinine 1.22, BUN 27, Potassium 3.9, Sodium 142 06/12/2016 Creatinine 1.12, BUN 22, Potassium 4.0, Sodium 140 05/02/2016 Creatinine 1.33, BUN 34, Potassium 4.1, Sodium 136 03/20/2016 Creatinine 1.17, BUN 36, Potassium 4.7, Sodium 136 11/03/2015 Creatinine 1.03, BUN 17, Potassium 4.0, Sodium 140  Recommendations: Left voice mail with ICM number and encouraged to call if experiencing any fluid symptoms.  Follow-up plan: ICM clinic phone appointment on 10/27/2017.    Copy of ICM check sent to Dr. Caryl Comes.   3 month ICM trend: 09/26/2017    1 Year ICM trend:       [name], RN 09/26/2017 7:31 AM

## 2017-10-09 ENCOUNTER — Other Ambulatory Visit: Payer: Self-pay | Admitting: Cardiology

## 2017-10-13 ENCOUNTER — Other Ambulatory Visit (HOSPITAL_COMMUNITY): Payer: Self-pay | Admitting: *Deleted

## 2017-10-13 MED ORDER — ATORVASTATIN CALCIUM 20 MG PO TABS: 20.0000 mg | ORAL_TABLET | Freq: Every day | ORAL | 3 refills | Status: DC

## 2017-10-13 MED ORDER — IVABRADINE HCL 5 MG PO TABS: 5.0000 mg | ORAL_TABLET | Freq: Two times a day (BID) | ORAL | 10 refills | Status: DC

## 2017-10-14 ENCOUNTER — Encounter (HOSPITAL_COMMUNITY): Payer: Self-pay

## 2017-10-14 NOTE — Progress Notes (Signed)
Medical record request for all available records from [facility] clinic office/providers faxed to provided # [phone] 45 total pages. Copy of request scanned into patient's electronic medical record.  Renee Pain, RN

## 2017-10-27 ENCOUNTER — Ambulatory Visit (INDEPENDENT_AMBULATORY_CARE_PROVIDER_SITE_OTHER): Payer: Medicaid Other

## 2017-10-27 DIAGNOSIS — I5042 Chronic combined systolic (congestive) and diastolic (congestive) heart failure: Secondary | ICD-10-CM

## 2017-10-27 DIAGNOSIS — Z95810 Presence of automatic (implantable) cardiac defibrillator: Secondary | ICD-10-CM

## 2017-10-27 NOTE — Progress Notes (Signed)
EPIC Encounter for ICM Monitoring  Patient Name: GEORDAN XU is a 57 y.o. male Date: 10/27/2017 Primary Care Physican: Luetta Nutting, DO Primary Cardiologist:[facility]  Electrophysiologist: Caryl Comes Dry Weight:289 lbs -190.4 lbs      Heart Failure questions reviewed, pt reported weight gain.      Thoracic impedance abnormal suggesting fluid accumulation.         Prescribed dosage: Furosemide40 mg 1 tablet twice a day. Potassium 20 mEq 1 tablet daily.   LABS: 08/19/2017 Creatinine 1.16, BUN 16, Potassium 4.2, Sodium 137, EGFR >60 01/24/2017 Creatinine 1.16, BUN 27, Potassium 5.2, Sodium 140, Care Everywhere results 01/08/2017 Creatinine 1.20, BUN 29, Potassium 4.5, Sodium 138, EGFR >60 12/27/2016 Creatinine 0.98, BUN 18, Potassium 4.5, Sodium 139, EGFR >60 12/02/2016 Creatinine 1.23, BUN 31, Potassium 4.5, Sodium 138, EGFR >60 11/22/2016 Creatinine 1.03, BUN 19, Potassium 4.6, Sodium 136, EGFR >60 10/28/2016 Creatinine 1.48, BUN 48, Potassium 4.3, Sodium 138, EGFR 52-60 10/14/2016 Creatinine 1.19, BUN 23, Potassium 4.2, Sodium 139, EGFR >60 10/04/2016 Creatinine 1.18, BUN 35, Potassium 4.2, Sodium 138, EGFR >60 09/03/2016 Creatinine 1.13, BUN 28, Potassium 3.9, Sodium 138 07/19/2016 Creatinine 1.09, BUN 27, Potassium 4.3, Sodium 138 07/08/2016 Creatinine 1.22, BUN 27, Potassium 3.9, Sodium 142 06/12/2016 Creatinine 1.12, BUN 22, Potassium 4.0, Sodium 140 05/02/2016 Creatinine 1.33, BUN 34, Potassium 4.1, Sodium 136 03/20/2016 Creatinine 1.17, BUN 36, Potassium 4.7, Sodium 136 11/03/2015 Creatinine 1.03, BUN 17, Potassium 4.0, Sodium 140  Recommendations: Patient has not been taking Furosemide evening dose.  He said he will take 80 mg tonight and resume the 2nd dose daily as prescribed.    Follow-up plan: ICM clinic phone appointment on 11/06/2017.    Copy of ICM check sent to Dr. Caryl Comes and Dr. Aundra Dubin for review.   3 month ICM trend: 10/27/2017    1 Year ICM  trend:       [name], RN 10/27/2017 4:49 PM

## 2017-11-06 ENCOUNTER — Ambulatory Visit (INDEPENDENT_AMBULATORY_CARE_PROVIDER_SITE_OTHER): Payer: Self-pay

## 2017-11-06 DIAGNOSIS — I5042 Chronic combined systolic (congestive) and diastolic (congestive) heart failure: Secondary | ICD-10-CM

## 2017-11-06 DIAGNOSIS — Z95810 Presence of automatic (implantable) cardiac defibrillator: Secondary | ICD-10-CM

## 2017-11-06 NOTE — Progress Notes (Signed)
EPIC Encounter for ICM Monitoring  Patient Name: David Bright is a 57 y.o. male Date: 11/06/2017 Primary Care Physican: Luetta Nutting, DO Primary Cardiologist:[facility] Heart Failure Clinic: [facility]  Electrophysiologist: Caryl Comes Dry Weight:292 lbs       Heart Failure questions reviewed, pt asymptomatic.   Thoracic impedance returned to normal.        Prescribed dosage: Furosemide40 mg 1 tablet twice a day. Potassium 20 mEq 1 tablet daily.   LABS: 08/19/2017 Creatinine 1.16, BUN 16, Potassium 4.2, Sodium 137, EGFR >60 01/24/2017 Creatinine 1.16, BUN 27, Potassium 5.2, Sodium 140, Care Everywhere results 01/08/2017 Creatinine 1.20, BUN 29, Potassium 4.5, Sodium 138, EGFR >60 12/27/2016 Creatinine 0.98, BUN 18, Potassium 4.5, Sodium 139, EGFR >60 12/02/2016 Creatinine 1.23, BUN 31, Potassium 4.5, Sodium 138, EGFR >60 11/22/2016 Creatinine 1.03, BUN 19, Potassium 4.6, Sodium 136, EGFR >60 10/28/2016 Creatinine 1.48, BUN 48, Potassium 4.3, Sodium 138, EGFR 52-60 10/14/2016 Creatinine 1.19, BUN 23, Potassium 4.2, Sodium 139, EGFR >60 10/04/2016 Creatinine 1.18, BUN 35, Potassium 4.2, Sodium 138, EGFR >60 09/03/2016 Creatinine 1.13, BUN 28, Potassium 3.9, Sodium 138 07/19/2016 Creatinine 1.09, BUN 27, Potassium 4.3, Sodium 138 07/08/2016 Creatinine 1.22, BUN 27, Potassium 3.9, Sodium 142 06/12/2016 Creatinine 1.12, BUN 22, Potassium 4.0, Sodium 140 05/02/2016 Creatinine 1.33, BUN 34, Potassium 4.1, Sodium 136 03/20/2016 Creatinine 1.17, BUN 36, Potassium 4.7, Sodium 136 11/03/2015 Creatinine 1.03, BUN 17, Potassium 4.0, Sodium 140  Recommendations: No changes.  Encouraged to call for fluid symptoms.  Follow-up plan: ICM clinic phone appointment on 11/27/2017.    Copy of ICM check sent to Dr. Caryl Comes.   3 month ICM trend: 11/06/2017    1 Year ICM trend:       [name], RN 11/06/2017 11:05 AM

## 2017-11-11 NOTE — Progress Notes (Signed)
Cardiology Office Note    Date:  11/12/2017   ID:  David Bright, DOB [birth year], MRN [id number]  PCP:  Luetta Nutting, DO  Cardiologist: Ena Dawley, MD Advanced heart failure Dr. Aundra Dubin   No chief complaint on file.   History of Present Illness:  David Bright is a 57 y.o. male with history of nonischemic cardiomyopathy and chronic systolic CHF LVEF 84% on echo 08/2015 with severely dilated LV, repeat echo 09/2016 LVEF up to 40%.  Status post ICD.  Has optical that measures intracardiac pressures nonobstructive coronaries on cath in 02/2015.  Significant PAD managed by Dr. Alvester Chou medically, OSA on CPAP, smoker, DM type II, hypertension, HLD.  Last saw Dr. Aundra Dubin 08/19/17 and fluid status felt to be stable.  He ordered a follow-up 2D echo done 09/15/17 that showed further improvement in LV EF 50-55% with grade 1 DD.  Optival was check 11/06/17 and was stable.  Patient comes in today for yearly follow-up.  He is applying for disability.  He does not have much money to go to appointments so skipped some.  He is not eating well.  He had potato chips yesterday and this morning before he came into the office.  He says he craves salt and buys a bag every 3 days.  He is also smoking and not using Chantix.  He is smoking about 1 pack/week.  Weight is up 14 pounds since he saw Dr. Algernon Huxley in [place] clinic 08/19/17.  Patient says it is because he is not eating anything but fried chicken and hamburgers.  Denies chest pain or significant shortness of breath.    Past Medical History:  Diagnosis Date  . Chronic combined systolic and diastolic CHF (congestive heart failure) (HCC)    Systolic and diastolic  . COPD (chronic obstructive pulmonary disease) ([place])   . Hyperlipidemia   . Hypertension   . Non compliance with medical treatment   . Non-ischemic cardiomyopathy ([place])    a. Normal cath 2002;  b. 01/2015 Echo: EF 15-20%, mod conc LVH, restrictive physiology, mod MR, sev dil LA, triv TR/PI;  c. 02/2015  Cath: LM nl, LAD 30d, LCX nl, OM1 small, nl, RCA nl;  d. 02/2015 s/p MDT single lead AICD (ser# [id number]).  . OSA (obstructive sleep apnea) 06/04/2015   Mild with AHI 12.3/hr and AHI 24.5/hr in REM sleep.  . Polysubstance abuse ([place])    History of quitting alcohol, marijuana and crack over 12 years ago  . Type II diabetes mellitus ([place])     Past Surgical History:  Procedure Laterality Date  . CARDIAC CATHETERIZATION N/A 03/06/2015   Procedure: Right/Left Heart Cath and Coronary Angiography;  Surgeon: Belva Crome, MD;  Location: [place] [facility];  Service: Cardiovascular;  Laterality: N/A;  . EP IMPLANTABLE DEVICE N/A 03/06/2015   Procedure: ICD Implant;  Surgeon: Deboraha Sprang, MD;  Location: [place] [facility];  Service: Cardiovascular;  Laterality: N/A;  . none      Current Medications: Current Meds  Medication Sig  . aspirin EC 81 MG tablet Take 1 tablet (81 mg total) by mouth daily.  Marland Kitchen atorvastatin (LIPITOR) 20 MG tablet Take 1 tablet (20 mg total) by mouth daily.  . canagliflozin (INVOKANA) 300 MG TABS tablet Take 300 mg by mouth daily before breakfast.  . carvedilol (COREG) 12.5 MG tablet Take 1.5 tablets (18.75 mg total) by mouth 2 (two) times daily with a meal.  . furosemide (LASIX) 40 MG tablet Take 1 tablet (  40 mg total) by mouth 2 (two) times daily. Please make yearly appt with Dr. [name] for March. 1st attempt  . isosorbide-hydrALAZINE (BIDIL) 20-37.5 MG tablet Take 1 tablet by mouth 2 (two) times daily.  . ivabradine (CORLANOR) 5 MG TABS tablet Take 1 tablet (5 mg total) by mouth 2 (two) times daily with a meal.  . meloxicam (MOBIC) 15 MG tablet Take 1 tablet by mouth daily.  . mesalamine (LIALDA) 1.2 g EC tablet Take 1.2 g 2 (two) times daily by mouth.  . potassium chloride SA (K-DUR,KLOR-CON) 20 MEQ tablet Take 1 tablet (20 mEq total) by mouth daily.  . sacubitril-valsartan (ENTRESTO) 97-103 MG Take 1 tablet by mouth 2 (two) times daily.  . sildenafil (REVATIO) 20 MG  tablet Take 20 mg by mouth. Pt reported 12/23/16 he takes 2-5 tablets prn for sexual activity.  . sitaGLIPtin-metformin (JANUMET) 50-1000 MG tablet Take 1 tablet by mouth 2 (two) times daily with a meal.  . spironolactone (ALDACTONE) 25 MG tablet TAKE ONE TABLET BY MOUTH ONCE DAILY  . [DISCONTINUED] varenicline (CHANTIX PAK) 0.5 MG X 11 & 1 MG X 42 tablet Take by mouth 2 (two) times daily. Take one 0.5 mg tablet by mouth once daily for 3 days, then increase to one 0.5 mg tablet twice daily for 4 days, then increase to one 1 mg tablet twice daily.     Allergies:   Patient has no known allergies.   Social History   Socioeconomic History  . Marital status: Single    Spouse name: Not on file  . Number of children: Not on file  . Years of education: Not on file  . Highest education level: Not on file  Occupational History  . 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 Financial resource strain: Not on file  . Food insecurity:    Worry: Not on file    Inability: Not on file  . Transportation needs:    Medical: Not on file    Non-medical: Not on file  Tobacco Use  . Smoking status: Current Every Day Smoker    Packs/day: 1.00    Years: 33.00    Pack years: 33.00    Types: Cigarettes  . Smokeless tobacco: Never Used  Substance and Sexual Activity  . Alcohol use: No    Comment: QUIT IN 2000  . Drug use: No    Comment: quit 12 years ago  . Sexual activity: Never  Lifestyle  . Physical activity:    Days per week: Not on file    Minutes per session: Not on file  . Stress: Not on file  Relationships  . Social connections:    Talks on phone: Not on file    Gets together: Not on file    Attends religious service: Not on file    Active member of club or organization: Not on file    Attends meetings of clubs or organizations: Not on file    Relationship status: Not on file  Other Topics Concern  . Not on file  Social History Narrative    Lives in [place], [place] with housemate.      Family History:  The patient's family history includes Cancer in his mother.   ROS:   Please see the history of present illness.    Review of Systems  Cardiovascular: Positive for dyspnea on exertion and leg swelling.   All other systems reviewed and are negative.  PHYSICAL EXAM:   VS:  BP 130/80   Pulse 79   Ht 6' (1.829 m)   Wt 299 lb (135.6 kg)   SpO2 98%   BMI 40.55 kg/m   Physical Exam  GEN: Obese, in no acute distress  Neck: no JVD, carotid bruits, or masses Cardiac:RRR; positive S4 and 2/6 systolic murmur at left sternal border Respiratory: Decreased breath sounds but clear to auscultation bilaterally, normal work of breathing GI: soft, nontender, nondistended, + BS Ext: +1 edema bilaterally without cyanosis, clubbing, decreased distal pulses bilaterally Neuro:  Alert and Oriented x 3,  Psych: euthymic mood, full affect  Wt Readings from Last 3 Encounters:  11/12/17 299 lb (135.6 kg)  08/19/17 286 lb 8 oz (130 kg)  06/19/17 283 lb (128.4 kg)      Studies/Labs Reviewed:   EKG:  EKG is not ordered today.   Recent Labs: 12/27/2016: B Natriuretic Peptide 55.4 06/19/2017: Hemoglobin 13.7; Platelets 263 08/19/2017: BUN 16; Creatinine, Ser 1.16; Potassium 4.2; Sodium 137   Lipid Panel    Component Value Date/Time   CHOL 137 08/19/2017 1445   TRIG 143 08/19/2017 1445   HDL 39 (L) 08/19/2017 1445   CHOLHDL 3.5 08/19/2017 1445   VLDL 29 08/19/2017 1445   LDLCALC 69 08/19/2017 1445    Additional studies/ records that were reviewed today include:  2D echo 09/15/17  ------------------------------------------------------------------- Study Conclusions   - Left ventricle: The cavity size was normal. There was mild   concentric hypertrophy. Systolic function was at the lower limits   of normal. The estimated ejection fraction was in the range of   50% to 55%. Wall motion was normal; there were no regional wall   motion  abnormalities. Doppler parameters are consistent with   abnormal left ventricular relaxation (grade 1 diastolic   dysfunction). - Left atrium: The atrium was mildly dilated.   Impressions:   - Compared to the previous study, there is further improvement in   LV systolic function.   ASSESSMENT:    1. NICM (nonischemic cardiomyopathy) ([place])   2. Chronic combined systolic and diastolic CHF (congestive heart failure) ([place])   3. Essential hypertension   4. Peripheral arterial disease ([place])   5. Mixed hyperlipidemia      PLAN:  In order of problems listed above:  Nonischemic cardiomyopathy ejection fraction 20% in the past but most recent echo 09/15/17 LVEF 50-55% with grade 1 DD.  Followed closely by Dr. Aundra Dubin in the heart failure clinic status post ICD( now followed at [facility] med center)-patient's weight is up 14 pounds since January.  He does not have a lot of extra fluid on board and Optival was normal on 11/06/17.  He is eating a lot of salt.  Reiterated importance of taking care of himself with 2 g sodium diet and control of his diabetes.  Follow-up with CHF clinic in the next month or 2  Chronic combined systolic and diastolic CHF systolic heart failure improving.  Some fluid on board today but mild.  Essential hypertension pressure controlled  PAD followed by Dr. Gwenlyn Found and treated medically  Mixed hyperlipidemia continue Lipitor  Tobacco abuse smoking cessation discussed.  Patient cannot afford Chantix.  Medication Adjustments/Labs and Tests Ordered: Current medicines are reviewed at length with the patient today.  Concerns regarding medicines are outlined above.  Medication changes, Labs and Tests ordered today are listed in the Patient Instructions below. There are no Patient Instructions on file for this visit.   Signed, Selinda Eon  Vita Barley  11/12/2017 9:14 AM    [facility] [address] [place], [place], [place]  [address] Phone: [phone]; Fax:  [phone]

## 2017-11-12 ENCOUNTER — Encounter: Payer: Self-pay | Admitting: Physician Assistant

## 2017-11-12 ENCOUNTER — Ambulatory Visit (INDEPENDENT_AMBULATORY_CARE_PROVIDER_SITE_OTHER): Payer: Medicaid Other | Admitting: Physician Assistant

## 2017-11-12 VITALS — BP 130/80 | HR 79 | Ht 72.0 in | Wt 299.0 lb

## 2017-11-12 DIAGNOSIS — I739 Peripheral vascular disease, unspecified: Secondary | ICD-10-CM

## 2017-11-12 DIAGNOSIS — I10 Essential (primary) hypertension: Secondary | ICD-10-CM | POA: Diagnosis not present

## 2017-11-12 DIAGNOSIS — E782 Mixed hyperlipidemia: Secondary | ICD-10-CM

## 2017-11-12 DIAGNOSIS — I5042 Chronic combined systolic (congestive) and diastolic (congestive) heart failure: Secondary | ICD-10-CM | POA: Diagnosis not present

## 2017-11-12 DIAGNOSIS — I428 Other cardiomyopathies: Secondary | ICD-10-CM | POA: Diagnosis not present

## 2017-11-12 NOTE — Patient Instructions (Signed)
Medication Instructions:  Your physician recommends that you continue on your current medications as directed. Please refer to the Current Medication list given to you today.   Labwork: None ordered  Testing/Procedures: None ordered  Follow-Up: Your physician recommends that you schedule a follow-up appointment within the next month with Dr. Aundra Dubin    Any Other Special Instructions Will Be Listed Below (If Applicable).   Low-Sodium Eating Plan Sodium, which is an element that makes up salt, helps you maintain a healthy balance of fluids in your body. Too much sodium can increase your blood pressure and cause fluid and waste to be held in your body. Your health care provider or dietitian may recommend following this plan if you have high blood pressure (hypertension), kidney disease, liver disease, or heart failure. Eating less sodium can help lower your blood pressure, reduce swelling, and protect your heart, liver, and kidneys. What are tips for following this plan? General guidelines  Most people on this plan should limit their sodium intake to 1,500-2,000 mg (milligrams) of sodium each day. Reading food labels  The Nutrition Facts label lists the amount of sodium in one serving of the food. If you eat more than one serving, you must multiply the listed amount of sodium by the number of servings.  Choose foods with less than 140 mg of sodium per serving.  Avoid foods with 300 mg of sodium or more per serving. Shopping  Look for lower-sodium products, often labeled as "low-sodium" or "no salt added."  Always check the sodium content even if foods are labeled as "unsalted" or "no salt added".  Buy fresh foods. ? Avoid canned foods and premade or frozen meals. ? Avoid canned, cured, or processed meats  Buy breads that have less than 80 mg of sodium per slice. Cooking  Eat more home-cooked food and less restaurant, buffet, and fast food.  Avoid adding salt when cooking.  Use salt-free seasonings or herbs instead of table salt or sea salt. Check with your health care provider or pharmacist before using salt substitutes.  Cook with plant-based oils, such as canola, sunflower, or olive oil. Meal planning  When eating at a restaurant, ask that your food be prepared with less salt or no salt, if possible.  Avoid foods that contain MSG (monosodium glutamate). MSG is sometimes added to [place] food, bouillon, and some canned foods. What foods are recommended? The items listed may not be a complete list. Talk with your dietitian about what dietary choices are best for you. Grains Low-sodium cereals, including oats, puffed wheat and rice, and shredded wheat. Low-sodium crackers. Unsalted rice. Unsalted pasta. Low-sodium bread. Whole-grain breads and whole-grain pasta. Vegetables Fresh or frozen vegetables. "No salt added" canned vegetables. "No salt added" tomato sauce and paste. Low-sodium or reduced-sodium tomato and vegetable juice. Fruits Fresh, frozen, or canned fruit. Fruit juice. Meats and other protein foods Fresh or frozen (no salt added) meat, poultry, seafood, and fish. Low-sodium canned tuna and salmon. Unsalted nuts. Dried peas, beans, and lentils without added salt. Unsalted canned beans. Eggs. Unsalted nut butters. Dairy Milk. Soy milk. Cheese that is naturally low in sodium, such as ricotta cheese, fresh mozzarella, or Swiss cheese Low-sodium or reduced-sodium cheese. Cream cheese. Yogurt. Fats and oils Unsalted butter. Unsalted margarine with no trans fat. Vegetable oils such as canola or olive oils. Seasonings and other foods Fresh and dried herbs and spices. Salt-free seasonings. Low-sodium mustard and ketchup. Sodium-free salad dressing. Sodium-free light mayonnaise. Fresh or refrigerated horseradish. Lemon juice.  Vinegar. Homemade, reduced-sodium, or low-sodium soups. Unsalted popcorn and pretzels. Low-salt or salt-free chips. What foods are not  recommended? The items listed may not be a complete list. Talk with your dietitian about what dietary choices are best for you. Grains Instant hot cereals. Bread stuffing, pancake, and biscuit mixes. Croutons. Seasoned rice or pasta mixes. Noodle soup cups. Boxed or frozen macaroni and cheese. Regular salted crackers. Self-rising flour. Vegetables Sauerkraut, pickled vegetables, and relishes. Olives. [place] fries. Onion rings. Regular canned vegetables (not low-sodium or reduced-sodium). Regular canned tomato sauce and paste (not low-sodium or reduced-sodium). Regular tomato and vegetable juice (not low-sodium or reduced-sodium). Frozen vegetables in sauces. Meats and other protein foods Meat or fish that is salted, canned, smoked, spiced, or pickled. Bacon, ham, sausage, hotdogs, corned beef, chipped beef, packaged lunch meats, salt pork, jerky, pickled herring, anchovies, regular canned tuna, sardines, salted nuts. Dairy Processed cheese and cheese spreads. Cheese curds. Blue cheese. Feta cheese. String cheese. Regular cottage cheese. Buttermilk. Canned milk. Fats and oils Salted butter. Regular margarine. Ghee. Bacon fat. Seasonings and other foods Onion salt, garlic salt, seasoned salt, table salt, and sea salt. Canned and packaged gravies. Worcestershire sauce. Tartar sauce. Barbecue sauce. Teriyaki sauce. Soy sauce, including reduced-sodium. Steak sauce. Fish sauce. Oyster sauce. Cocktail sauce. Horseradish that you find on the shelf. Regular ketchup and mustard. Meat flavorings and tenderizers. Bouillon cubes. Hot sauce and Tabasco sauce. Premade or packaged marinades. Premade or packaged taco seasonings. Relishes. Regular salad dressings. Salsa. Potato and tortilla chips. Corn chips and puffs. Salted popcorn and pretzels. Canned or dried soups. Pizza. Frozen entrees and pot pies. Summary  Eating less sodium can help lower your blood pressure, reduce swelling, and protect your heart, liver,  and kidneys.  Most people on this plan should limit their sodium intake to 1,500-2,000 mg (milligrams) of sodium each day.  Canned, boxed, and frozen foods are high in sodium. Restaurant foods, fast foods, and pizza are also very high in sodium. You also get sodium by adding salt to food.  Try to cook at home, eat more fresh fruits and vegetables, and eat less fast food, canned, processed, or prepared foods. This information is not intended to replace advice given to you by your health care provider. Make sure you discuss any questions you have with your health care provider. Document Released: 01/18/2002 Document Revised: 07/22/2016 Document Reviewed: 07/22/2016 Elsevier Interactive Patient Education  2018 [place] NOW  Steps to Quit Smoking Smoking tobacco can be bad for your health. It can also affect almost every organ in your body. Smoking puts you and people around you at risk for many serious long-lasting (chronic) diseases. Quitting smoking is hard, but it is one of the best things that you can do for your health. It is never too late to quit. What are the benefits of quitting smoking? When you quit smoking, you lower your risk for getting serious diseases and conditions. They can include:  Lung cancer or lung disease.  Heart disease.  Stroke.  Heart attack.  Not being able to have children (infertility).  Weak bones (osteoporosis) and broken bones (fractures).  If you have coughing, wheezing, and shortness of breath, those symptoms may get better when you quit. You may also get sick less often. If you are pregnant, quitting smoking can help to lower your chances of having a baby of low birth weight. What can I do to help me quit smoking? Talk with your doctor about what  can help you quit smoking. Some things you can do (strategies) include:  Quitting smoking totally, instead of slowly cutting back how much you smoke over a period of time.  Going to  in-person counseling. You are more likely to quit if you go to many counseling sessions.  Using resources and support systems, such as: ? Database administrator with a Social worker. ? Phone quitlines. ? Careers information officer. ? Support groups or group counseling. ? Text messaging programs. ? Mobile phone apps or applications.  Taking medicines. Some of these medicines may have nicotine in them. If you are pregnant or breastfeeding, do not take any medicines to quit smoking unless your doctor says it is okay. Talk with your doctor about counseling or other things that can help you.  Talk with your doctor about using more than one strategy at the same time, such as taking medicines while you are also going to in-person counseling. This can help make quitting easier. What things can I do to make it easier to quit? Quitting smoking might feel very hard at first, but there is a lot that you can do to make it easier. Take these steps:  Talk to your family and friends. Ask them to support and encourage you.  Call phone quitlines, reach out to support groups, or work with a Social worker.  Ask people who smoke to not smoke around you.  Avoid places that make you want (trigger) to smoke, such as: ? Bars. ? Parties. ? Smoke-break areas at work.  Spend time with people who do not smoke.  Lower the stress in your life. Stress can make you want to smoke. Try these things to help your stress: ? Getting regular exercise. ? Deep-breathing exercises. ? Yoga. ? Meditating. ? Doing a body scan. To do this, close your eyes, focus on one area of your body at a time from head to toe, and notice which parts of your body are tense. Try to relax the muscles in those areas.  Download or buy apps on your mobile phone or tablet that can help you stick to your quit plan. There are many free apps, such as QuitGuide from the [employer] Office manager for Disease Control and Prevention). You can find more support from smokefree.gov and  other websites.  This information is not intended to replace advice given to you by your health care provider. Make sure you discuss any questions you have with your health care provider. Document Released: 05/25/2009 Document Revised: 03/26/2016 Document Reviewed: 12/13/2014 Elsevier Interactive Patient Education  [employer].   If you need a refill on your cardiac medications before your next appointment, please call your pharmacy.

## 2017-11-18 ENCOUNTER — Other Ambulatory Visit (HOSPITAL_COMMUNITY): Payer: Self-pay | Admitting: *Deleted

## 2017-11-18 MED ORDER — FUROSEMIDE 40 MG PO TABS: 40.0000 mg | ORAL_TABLET | Freq: Two times a day (BID) | ORAL | 3 refills | Status: DC

## 2017-11-18 MED ORDER — CARVEDILOL 12.5 MG PO TABS: 18.7500 mg | ORAL_TABLET | Freq: Two times a day (BID) | ORAL | 3 refills | Status: DC

## 2017-11-18 MED ORDER — POTASSIUM CHLORIDE CRYS ER 20 MEQ PO TBCR: 20.0000 meq | EXTENDED_RELEASE_TABLET | Freq: Every day | ORAL | 3 refills | Status: DC

## 2017-11-18 MED ORDER — SPIRONOLACTONE 25 MG PO TABS: 25.0000 mg | ORAL_TABLET | Freq: Every day | ORAL | 2 refills | Status: DC

## 2017-11-27 ENCOUNTER — Ambulatory Visit (INDEPENDENT_AMBULATORY_CARE_PROVIDER_SITE_OTHER): Payer: Medicaid Other | Admitting: *Deleted

## 2017-11-27 DIAGNOSIS — Z95810 Presence of automatic (implantable) cardiac defibrillator: Secondary | ICD-10-CM | POA: Diagnosis not present

## 2017-11-27 DIAGNOSIS — I5042 Chronic combined systolic (congestive) and diastolic (congestive) heart failure: Secondary | ICD-10-CM

## 2017-11-27 DIAGNOSIS — I428 Other cardiomyopathies: Secondary | ICD-10-CM

## 2017-11-27 LAB — CUP PACEART REMOTE DEVICE CHECK
Brady Statistic RV Percent Paced: 0 %
Date Time Interrogation Session: 20190418073623
HIGH POWER IMPEDANCE MEASURED VALUE: 71 Ohm
Implantable Lead Location: 753860
Implantable Lead Model: 293
Implantable Lead Serial Number: 383593
Lead Channel Impedance Value: 380 Ohm
Lead Channel Impedance Value: 380 Ohm
Lead Channel Sensing Intrinsic Amplitude: 11.25 mV
Lead Channel Setting Sensing Sensitivity: 0.3 mV
MDC IDC LEAD IMPLANT DT: 20160725
MDC IDC MSMT BATTERY REMAINING LONGEVITY: 120 mo
MDC IDC MSMT BATTERY VOLTAGE: 3.02 V
MDC IDC MSMT LEADCHNL RV PACING THRESHOLD AMPLITUDE: 0.875 V
MDC IDC MSMT LEADCHNL RV PACING THRESHOLD PULSEWIDTH: 0.4 ms
MDC IDC MSMT LEADCHNL RV SENSING INTR AMPL: 11.25 mV
MDC IDC PG IMPLANT DT: 20160725
MDC IDC SET LEADCHNL RV PACING AMPLITUDE: 2.5 V
MDC IDC SET LEADCHNL RV PACING PULSEWIDTH: 0.4 ms

## 2017-11-27 NOTE — Progress Notes (Signed)
EPIC Encounter for ICM Monitoring  Patient Name: David Bright is a 57 y.o. male Date: 11/27/2017 Primary Care Physican: Luetta Nutting, DO Primary Cardiologist:[facility]: [facility]  Electrophysiologist: Caryl Comes Dry Weight: 291 lbs        Heart Failure questions reviewed, pt asymptomatic.   Thoracic impedance normal.        Prescribed dosage: Furosemide40 mg 1 tablet twice a day. Potassium 20 mEq 1 tablet daily.   LABS: 08/19/2017 Creatinine 1.16, BUN 16, Potassium 4.2, Sodium 137, EGFR >60 01/24/2017 Creatinine 1.16, BUN 27, Potassium 5.2, Sodium 140, Care Everywhere results 01/08/2017 Creatinine 1.20, BUN 29, Potassium 4.5, Sodium 138, EGFR >60 12/27/2016 Creatinine 0.98, BUN 18, Potassium 4.5, Sodium 139, EGFR >60 12/02/2016 Creatinine 1.23, BUN 31, Potassium 4.5, Sodium 138, EGFR >60 11/22/2016 Creatinine 1.03, BUN 19, Potassium 4.6, Sodium 136, EGFR >60 10/28/2016 Creatinine 1.48, BUN 48, Potassium 4.3, Sodium 138, EGFR 52-60 10/14/2016 Creatinine 1.19, BUN 23, Potassium 4.2, Sodium 139, EGFR >60 10/04/2016 Creatinine 1.18, BUN 35, Potassium 4.2, Sodium 138, EGFR >60 09/03/2016 Creatinine 1.13, BUN 28, Potassium 3.9, Sodium 138 07/19/2016 Creatinine 1.09, BUN 27, Potassium 4.3, Sodium 138 07/08/2016 Creatinine 1.22, BUN 27, Potassium 3.9, Sodium 142 06/12/2016 Creatinine 1.12, BUN 22, Potassium 4.0, Sodium 140 05/02/2016 Creatinine 1.33, BUN 34, Potassium 4.1, Sodium 136 03/20/2016 Creatinine 1.17, BUN 36, Potassium 4.7, Sodium 136 11/03/2015 Creatinine 1.03, BUN 17, Potassium 4.0, Sodium 140  Recommendations: No changes. Reinforced to limit salt intake.  Encouraged to call for fluid symptoms.  Follow-up plan: ICM clinic phone appointment on 12/29/2017.  Office appointment with Dr Aundra Dubin on 12/23/2017.  Copy of ICM check sent to Dr. Caryl Comes.   3 month ICM trend: 11/27/2017    1 Year ICM trend:       [name], RN 11/27/2017 10:41  AM

## 2017-11-27 NOTE — Progress Notes (Signed)
Remote ICD transmission.   

## 2017-11-28 ENCOUNTER — Encounter: Payer: Self-pay | Admitting: Cardiology

## 2017-11-28 ENCOUNTER — Other Ambulatory Visit (HOSPITAL_COMMUNITY): Payer: Self-pay | Admitting: Cardiology

## 2017-12-02 ENCOUNTER — Other Ambulatory Visit: Payer: Self-pay | Admitting: Cardiology

## 2017-12-02 NOTE — Telephone Encounter (Signed)
Outpatient Medication Detail    Disp Refills Start End   potassium chloride SA (K-DUR,KLOR-CON) 20 MEQ tablet 90 tablet 3 11/18/2017    Sig - Route: Take 1 tablet (20 mEq total) by mouth daily. - Oral   Sent to pharmacy as: potassium chloride SA (K-DUR,KLOR-CON) 20 MEQ tablet   E-Prescribing Status: Receipt confirmed by pharmacy (11/18/2017 11:25 AM EDT)   Pharmacy   [facility] - [place], [place] - [address]

## 2017-12-04 ENCOUNTER — Telehealth (HOSPITAL_COMMUNITY): Payer: Self-pay | Admitting: *Deleted

## 2017-12-04 ENCOUNTER — Encounter (HOSPITAL_COMMUNITY): Payer: Self-pay

## 2017-12-04 NOTE — Progress Notes (Signed)
Entresto PA submitted through [employer]. Confirmation #: [id number] W Status: SUSPENDED

## 2017-12-04 NOTE — Telephone Encounter (Signed)
Entresto PA approved 12/03/2017 through 11/28/18.  PA# [id number].

## 2017-12-08 ENCOUNTER — Telehealth (HOSPITAL_COMMUNITY): Payer: Self-pay | Admitting: *Deleted

## 2017-12-08 NOTE — Telephone Encounter (Signed)
Delene Loll PA# [id number] approved 12/04/17 through 11/29/2018.

## 2017-12-23 ENCOUNTER — Encounter (HOSPITAL_COMMUNITY): Payer: Self-pay | Admitting: Cardiology

## 2017-12-23 ENCOUNTER — Ambulatory Visit (HOSPITAL_COMMUNITY)
Admission: RE | Admit: 2017-12-23 | Discharge: 2017-12-23 | Disposition: A | Discharge status: Home / Self Care | Payer: Medicaid Other | Source: Ambulatory Visit | Attending: Cardiology | Admitting: Cardiology

## 2017-12-23 ENCOUNTER — Encounter: Payer: Self-pay | Admitting: Cardiology

## 2017-12-23 ENCOUNTER — Other Ambulatory Visit: Payer: Self-pay

## 2017-12-23 VITALS — BP 123/62 | HR 84 | Wt 291.5 lb

## 2017-12-23 DIAGNOSIS — I5022 Chronic systolic (congestive) heart failure: Secondary | ICD-10-CM | POA: Diagnosis not present

## 2017-12-23 DIAGNOSIS — I110 Hypertensive heart disease with heart failure: Secondary | ICD-10-CM | POA: Insufficient documentation

## 2017-12-23 DIAGNOSIS — E1142 Type 2 diabetes mellitus with diabetic polyneuropathy: Secondary | ICD-10-CM | POA: Diagnosis not present

## 2017-12-23 DIAGNOSIS — Z7982 Long term (current) use of aspirin: Secondary | ICD-10-CM | POA: Diagnosis not present

## 2017-12-23 DIAGNOSIS — R918 Other nonspecific abnormal finding of lung field: Secondary | ICD-10-CM

## 2017-12-23 DIAGNOSIS — E782 Mixed hyperlipidemia: Secondary | ICD-10-CM

## 2017-12-23 DIAGNOSIS — Z7984 Long term (current) use of oral hypoglycemic drugs: Secondary | ICD-10-CM | POA: Diagnosis not present

## 2017-12-23 DIAGNOSIS — I429 Cardiomyopathy, unspecified: Secondary | ICD-10-CM | POA: Insufficient documentation

## 2017-12-23 DIAGNOSIS — I739 Peripheral vascular disease, unspecified: Secondary | ICD-10-CM | POA: Insufficient documentation

## 2017-12-23 DIAGNOSIS — E785 Hyperlipidemia, unspecified: Secondary | ICD-10-CM | POA: Diagnosis not present

## 2017-12-23 DIAGNOSIS — Z79899 Other long term (current) drug therapy: Secondary | ICD-10-CM | POA: Insufficient documentation

## 2017-12-23 DIAGNOSIS — I5042 Chronic combined systolic (congestive) and diastolic (congestive) heart failure: Secondary | ICD-10-CM | POA: Diagnosis not present

## 2017-12-23 DIAGNOSIS — G4733 Obstructive sleep apnea (adult) (pediatric): Secondary | ICD-10-CM | POA: Insufficient documentation

## 2017-12-23 DIAGNOSIS — F17210 Nicotine dependence, cigarettes, uncomplicated: Secondary | ICD-10-CM | POA: Insufficient documentation

## 2017-12-23 LAB — BASIC METABOLIC PANEL
ANION GAP: 8 (ref 5–15)
BUN: 30 mg/dL — ABNORMAL HIGH (ref 6–20)
CALCIUM: 9.3 mg/dL (ref 8.9–10.3)
CHLORIDE: 104 mmol/L (ref 101–111)
CO2: 26 mmol/L (ref 22–32)
CREATININE: 1.45 mg/dL — AB (ref 0.61–1.24)
GFR calc non Af Amer: 52 mL/min — ABNORMAL LOW (ref >60)
GFR, EST AFRICAN AMERICAN: 60 mL/min — AB (ref >60)
Glucose, Bld: 179 mg/dL — ABNORMAL HIGH (ref 65–99)
Potassium: 5.8 mmol/L — ABNORMAL HIGH (ref 3.5–5.1)
SODIUM: 138 mmol/L (ref 135–145)

## 2017-12-23 NOTE — Patient Instructions (Signed)
We will call you about starting Chantix   Non-Cardiac CT scanning, (CAT scanning), is a noninvasive, special x-ray that produces cross-sectional images of the body using x-rays and a computer. CT scans help physicians diagnose and treat medical conditions. For some CT exams, a contrast material is used to enhance visibility in the area of the body being studied. CT scans provide greater clarity and reveal more details than regular x-ray exams.  Labs drawn today (if we do not call you, then your lab work was stable)   Your physician recommends that you schedule a follow-up appointment in: 6 months with Dr. Aundra Dubin

## 2017-12-24 NOTE — Progress Notes (Signed)
PCP: [facility] Cardiology: Dr. Aundra Dubin  57 y.o. with history of chronic systolic CHF and COPD/active smoking presents for followup of CHF.  He has a known nonischemic cardiomyopathy, echo in 1/17 with EF 20% and a severely dilated LV.  No coronary disease on 7/16 cardiac cath.  Repeat echo was 2/18, showing EF up to 40%.   He is trying stop smoking with Chantix, down to 2-3 cigs/day. Still with leg fatigue/cramping after walking 100 yards.  He had significant PAD by peripheral arterial dopplers, saw Dr. Gwenlyn Found with plan of medical management for now. He also has knee pain which is limiting.   Echo was done today, EF up to 50-55% with mild LVH.   He is stable symptomatically. Weight up a few lbs.  No significant dyspnea but he is not very active.  No orthopnea/PND.  No chest pain.  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 He is still smoking, Chantix helped in the past.  Rare mild pain in calves with walking.   Medtronic device interrogated: Stable thoracic impedance, no VT.     Labs (12/17): K 4.3, creatinine 1.09 Labs (1/18): K 3.9, creatinine 1.13 Labs (3/18): K 4.3, creatinine 1.48, NT-proBNP 61 Labs (4/18): K 4.5, creatinine 1.23, LDL 71, BNP 34 Labs (5/18): K 4.5, creatinine 1.2 Labs (1/19): K 4.2, creatinine 1.19, LDL 69, HDL 39  PMH:  1. Chronic systolic CHF: Nonischemic cardiomyopathy.  Has Medtronic ICD.   - LHC (7/16) with nonobstructive disease.  - Echo (1/17) with EF 20%, severe LV dilation, mild to moderate MR.  - Echo (2/18): EF 40%, mild LV dilation.  - Echo (5/19): EF 50-55%, mild LVH 2. COPD: Active smoker.  - PFTs (2/18) with minimal obstruction, mild restriction suggesting a parenchymal process, low DLCO.  - CT chest (6/18): No ILD, +emphysema, lung nodules.  3. Type II diabetes with peripheral neuropathy.  4. OSA on CPAP 5. HTN 6. Hyperlipidemia 7. PAD: ABIs abnormal at outside office.  - Peripheral arterial dopplers (3/18) with > 50% left CIA and bilateral EIA stenosis,  50-74% mid LSFA stenosis.  - ABIs (2/19): 0.85 right, 0.86 left (mild).   SH: Lives in [place], works at [employer] and [employer], smokes about 2-3 cigs/day now.  Rare ETOH.  Remote substance abuse.   FH: Aunt with CHF, brother with ESRD.    ROS: All systems reviewed and negative except as per HPI.  Current Outpatient Medications  Medication Sig Dispense Refill  . aspirin EC 81 MG tablet Take 1 tablet (81 mg total) by mouth daily. 30 tablet 0  . atorvastatin (LIPITOR) 20 MG tablet Take 1 tablet (20 mg total) by mouth daily. 30 tablet 3  . canagliflozin (INVOKANA) 300 MG TABS tablet Take 300 mg by mouth daily before breakfast.    . carvedilol (COREG) 12.5 MG tablet Take 1.5 tablets (18.75 mg total) by mouth 2 (two) times daily with a meal. 90 tablet 3  . ENTRESTO 97-103 MG TAKE 1 TABLET BY MOUTH TWICE DAILY 60 tablet 6  . furosemide (LASIX) 40 MG tablet Take 1 tablet (40 mg total) by mouth 2 (two) times daily. 180 tablet 3  . isosorbide-hydrALAZINE (BIDIL) 20-37.5 MG tablet Take 1 tablet by mouth 2 (two) times daily.    . ivabradine (CORLANOR) 5 MG TABS tablet Take 1 tablet (5 mg total) by mouth 2 (two) times daily with a meal. 60 tablet 10  . meloxicam (MOBIC) 15 MG tablet Take 1 tablet by mouth daily.    Marland Kitchen  mesalamine (LIALDA) 1.2 g EC tablet Take 1.2 g 2 (two) times daily by mouth.    . potassium chloride SA (K-DUR,KLOR-CON) 20 MEQ tablet Take 1 tablet (20 mEq total) by mouth daily. 90 tablet 3  . sildenafil (REVATIO) 20 MG tablet Take 20 mg by mouth. Pt reported 12/23/16 he takes 2-5 tablets prn for sexual activity.    . sitaGLIPtin-metformin (JANUMET) 50-1000 MG tablet Take 1 tablet by mouth 2 (two) times daily with a meal.    . spironolactone (ALDACTONE) 25 MG tablet Take 1 tablet (25 mg total) by mouth daily. 90 tablet 2   No current facility-administered medications for this encounter.    BP 123/62   Pulse 84   Wt 291 lb 8 oz (132.2 kg)   SpO2 100%   BMI 39.53 kg/m  General:  NAD Neck: No JVD, no thyromegaly or thyroid nodule.  Lungs: Clear to auscultation bilaterally with normal respiratory effort. CV: Nondisplaced PMI.  Heart regular S1/S2, no S3/S4, no murmur.  No peripheral edema.  No carotid bruit.  Difficult to palpate pedal pulses.  Abdomen: Soft, nontender, no hepatosplenomegaly, no distention.  Skin: Intact without lesions or rashes.  Neurologic: Alert and oriented x 3.  Psych: Normal affect. Extremities: No clubbing or cyanosis.  HEENT: Normal.   Assessment/Plan: 1. Chronic systolic CHF: Nonischemic cardiomyopathy => ?due to viral myocarditis or prior substance abuse.  Medtronic ICD.  Echo 1/17 with severely dilated LV and EF 20%, EF up to 40% on echo 2/18 and up to 50-55% on echo done and reviewed today.  NYHA class II symptoms.  He does not look volume overloaded on exam or by Optivol. - Continue Lasix 40 mg bid. BMET today.  - Continue Entresto 97/103 bid,.  - Continue current ivabradine and spironolactone.  - Continue Coreg 18.75 mg bid.  - Continue Bidil 1 tab tid. 2. Smoking/suspect COPD:  PFTs done, somewhat concerning for interstitial lung disease rather than emphysema with restrictive PFTs and decreased DLCO => no ILD on CT, emphysema noted along with lung nodules.  He is still smoking, wants to try Chantix again.  - Prescription for Chantix.  - Repeat CT chest in 6/19 to follow lung nodules.   3. OSA: Continue CPAP.  4. PAD: Significant disease on peripheral dopplers, stable mild claudication. He saw Dr. Gwenlyn Found, medical management recommended.  I encouraged him to try to walk at least a short distance through the pain.  - Continue ASA 81 and statin. Good lipids in 1/19.   - PV followup with Dr. Gwenlyn Found.    Followup in 6 months.   Loralie Champagne 12/24/2017

## 2017-12-26 ENCOUNTER — Telehealth (HOSPITAL_COMMUNITY): Payer: Self-pay

## 2017-12-26 NOTE — Telephone Encounter (Signed)
Notes recorded by Shirley Muscat, RN on 12/26/2017 at 11:05 AM EDT Getting labs at PCP faxed Rx to [phone] ------  Notes recorded by Harvie Junior, CMA on 12/25/2017 at 3:21 PM EDT Left VM for pt to call back. ------  Notes recorded by Larey Dresser, MD on 12/24/2017 at 4:20 PM EDT Needs repeat labs stat, K probably hemolyzed.

## 2017-12-29 ENCOUNTER — Ambulatory Visit (INDEPENDENT_AMBULATORY_CARE_PROVIDER_SITE_OTHER): Payer: Medicaid Other

## 2017-12-29 DIAGNOSIS — I5042 Chronic combined systolic (congestive) and diastolic (congestive) heart failure: Secondary | ICD-10-CM | POA: Diagnosis not present

## 2017-12-29 DIAGNOSIS — Z95810 Presence of automatic (implantable) cardiac defibrillator: Secondary | ICD-10-CM

## 2017-12-29 NOTE — Progress Notes (Signed)
EPIC Encounter for ICM Monitoring  Patient Name: David Bright is a 57 y.o. male Date: 12/29/2017 Primary Care Physican: Luetta Nutting, DO Primary Cardiologist:[facility]  Electrophysiologist: Caryl Comes Dry Weight: 449PNP       Heart Failure questions reviewed, pt asymptomatic.   Thoracic impedance normal.        Prescribed dosage: Furosemide40 mg 1 tablet twice a day. Potassium 20 mEq 1 tablet daily.   LABS: 08/19/2017 Creatinine 1.16, BUN 16, Potassium 4.2, Sodium 137, EGFR >60 01/24/2017 Creatinine 1.16, BUN 27, Potassium 5.2, Sodium 140, Care Everywhere results 01/08/2017 Creatinine 1.20, BUN 29, Potassium 4.5, Sodium 138, EGFR >60 12/27/2016 Creatinine 0.98, BUN 18, Potassium 4.5, Sodium 139, EGFR >60 12/02/2016 Creatinine 1.23, BUN 31, Potassium 4.5, Sodium 138, EGFR >60 11/22/2016 Creatinine 1.03, BUN 19, Potassium 4.6, Sodium 136, EGFR >60 10/28/2016 Creatinine 1.48, BUN 48, Potassium 4.3, Sodium 138, EGFR 52-60 10/14/2016 Creatinine 1.19, BUN 23, Potassium 4.2, Sodium 139, EGFR >60 10/04/2016 Creatinine 1.18, BUN 35, Potassium 4.2, Sodium 138, EGFR >60 09/03/2016 Creatinine 1.13, BUN 28, Potassium 3.9, Sodium 138 07/19/2016 Creatinine 1.09, BUN 27, Potassium 4.3, Sodium 138 07/08/2016 Creatinine 1.22, BUN 27, Potassium 3.9, Sodium 142 06/12/2016 Creatinine 1.12, BUN 22, Potassium 4.0, Sodium 140 05/02/2016 Creatinine 1.33, BUN 34, Potassium 4.1, Sodium 136 03/20/2016 Creatinine 1.17, BUN 36, Potassium 4.7, Sodium 136 11/03/2015 Creatinine 1.03, BUN 17, Potassium 4.0, Sodium 140  Recommendations: No changes.   Encouraged to call for fluid symptoms.  Follow-up plan: ICM clinic phone appointment on 01/29/2018.   Copy of ICM check sent to Dr. Caryl Comes.   3 month ICM trend: 12/29/2017    1 Year ICM trend:       [name], RN 12/29/2017 4:28 PM

## 2017-12-30 ENCOUNTER — Ambulatory Visit (HOSPITAL_COMMUNITY): Payer: Medicaid Other | Attending: Cardiology

## 2018-01-29 ENCOUNTER — Ambulatory Visit (INDEPENDENT_AMBULATORY_CARE_PROVIDER_SITE_OTHER): Payer: Medicaid Other

## 2018-01-29 DIAGNOSIS — I5042 Chronic combined systolic (congestive) and diastolic (congestive) heart failure: Secondary | ICD-10-CM | POA: Diagnosis not present

## 2018-01-29 DIAGNOSIS — Z95810 Presence of automatic (implantable) cardiac defibrillator: Secondary | ICD-10-CM | POA: Diagnosis not present

## 2018-01-30 NOTE — Progress Notes (Signed)
EPIC Encounter for ICM Monitoring  Patient Name: David Bright is a 57 y.o. male Date: 01/30/2018 Primary Care Physican: Luetta Nutting, DO Primary Cardiologist:[facility]  Electrophysiologist: Caryl Comes Dry Weight:  109NQN       Heart Failure questions reviewed, pt asymptomatic.   Thoracic impedance normal.        Prescribed dosage: Furosemide40 mg 1 tablet twice a day. Potassium 20 mEq 1 tablet daily.   LABS: 08/19/2017 Creatinine 1.16, BUN 16, Potassium 4.2, Sodium 137, EGFR >60 01/24/2017 Creatinine 1.16, BUN 27, Potassium 5.2, Sodium 140, Care Everywhere results 01/08/2017 Creatinine 1.20, BUN 29, Potassium 4.5, Sodium 138, EGFR >60 12/27/2016 Creatinine 0.98, BUN 18, Potassium 4.5, Sodium 139, EGFR >60 12/02/2016 Creatinine 1.23, BUN 31, Potassium 4.5, Sodium 138, EGFR >60 11/22/2016 Creatinine 1.03, BUN 19, Potassium 4.6, Sodium 136, EGFR >60 10/28/2016 Creatinine 1.48, BUN 48, Potassium 4.3, Sodium 138, EGFR 52-60 10/14/2016 Creatinine 1.19, BUN 23, Potassium 4.2, Sodium 139, EGFR >60 10/04/2016 Creatinine 1.18, BUN 35, Potassium 4.2, Sodium 138, EGFR >60 09/03/2016 Creatinine 1.13, BUN 28, Potassium 3.9, Sodium 138 07/19/2016 Creatinine 1.09, BUN 27, Potassium 4.3, Sodium 138 07/08/2016 Creatinine 1.22, BUN 27, Potassium 3.9, Sodium 142 06/12/2016 Creatinine 1.12, BUN 22, Potassium 4.0, Sodium 140 05/02/2016 Creatinine 1.33, BUN 34, Potassium 4.1, Sodium 136 03/20/2016 Creatinine 1.17, BUN 36, Potassium 4.7, Sodium 136 11/03/2015 Creatinine 1.03, BUN 17, Potassium 4.0, Sodium 140  Recommendations: No changes.    Encouraged to call for fluid symptoms.  Follow-up plan: ICM clinic phone appointment on 03/02/2018.   Copy of ICM check sent to Dr. Caryl Comes.   3 month ICM trend: 01/29/2018    1 Year ICM trend:       [name], RN 01/30/2018 11:24 AM

## 2018-03-02 ENCOUNTER — Ambulatory Visit (INDEPENDENT_AMBULATORY_CARE_PROVIDER_SITE_OTHER): Payer: Medicaid Other | Admitting: *Deleted

## 2018-03-02 ENCOUNTER — Ambulatory Visit (INDEPENDENT_AMBULATORY_CARE_PROVIDER_SITE_OTHER): Payer: Medicaid Other

## 2018-03-02 DIAGNOSIS — I428 Other cardiomyopathies: Secondary | ICD-10-CM | POA: Diagnosis not present

## 2018-03-02 DIAGNOSIS — Z95810 Presence of automatic (implantable) cardiac defibrillator: Secondary | ICD-10-CM

## 2018-03-02 DIAGNOSIS — I5042 Chronic combined systolic (congestive) and diastolic (congestive) heart failure: Secondary | ICD-10-CM | POA: Diagnosis not present

## 2018-03-02 NOTE — Progress Notes (Signed)
Remote ICD transmission.   

## 2018-03-03 ENCOUNTER — Telehealth: Payer: Self-pay

## 2018-03-03 ENCOUNTER — Encounter: Payer: Self-pay | Admitting: Cardiology

## 2018-03-03 NOTE — Progress Notes (Signed)
EPIC Encounter for ICM Monitoring  Patient Name: David Bright is a 57 y.o. male Date: 03/03/2018 Primary Care Physican: Luetta Nutting, DO Primary Cardiologist:[facility]  Electrophysiologist: Caryl Comes Dry Weight: Previous weight 291lbs       Attempted call to patient and unable to reach.   Transmission reviewed.    Thoracic impedance abnormal suggesting fluid accumulation starting 02/24/2018.         Prescribed dosage: Furosemide40 mg 1 tablet twice a day. Potassium 20 mEq 1 tablet daily.   LABS: 01/02/2018 Creatinine 1.21, BUN 33, Potassium 4.6, Sodium 140, EGFR 66-76 Care Everywhere 12/23/2017 Creatinine 1.45, BUN 30, Potassium 5.8, Sodium 138, EGFR 52-60 08/19/2017 Creatinine 1.16, BUN 16, Potassium 4.2, Sodium 137, EGFR >60 01/24/2017 Creatinine 1.16, BUN 27, Potassium 5.2, Sodium 140, Care Everywhere results 01/08/2017 Creatinine 1.20, BUN 29, Potassium 4.5, Sodium 138, EGFR >60 12/27/2016 Creatinine 0.98, BUN 18, Potassium 4.5, Sodium 139, EGFR >60  Recommendations: NONE - Unable to reach.  Follow-up plan: ICM clinic phone appointment on 03/10/2018 to recheck fluid levels.       Copy of ICM check sent to Dr. Caryl Comes and Dr Aundra Dubin for review and if any recommendations will call back.   3 month ICM trend: 03/03/2018    1 Year ICM trend:       [name], RN 03/03/2018 10:39 AM

## 2018-03-03 NOTE — Telephone Encounter (Signed)
Remote ICM transmission received.  Attempted call to patient and patient answered but was busy.

## 2018-03-03 NOTE — Progress Notes (Addendum)
Returned patient call as requested by voice mail message and no answer.  Left detailed message regarding transmission

## 2018-03-08 NOTE — Progress Notes (Signed)
Increase Lasix to 60 mg bid x 3 days then back to 40 mg bid.

## 2018-03-10 ENCOUNTER — Ambulatory Visit (INDEPENDENT_AMBULATORY_CARE_PROVIDER_SITE_OTHER): Payer: Medicaid Other

## 2018-03-10 DIAGNOSIS — I5042 Chronic combined systolic (congestive) and diastolic (congestive) heart failure: Secondary | ICD-10-CM

## 2018-03-10 DIAGNOSIS — Z95810 Presence of automatic (implantable) cardiac defibrillator: Secondary | ICD-10-CM

## 2018-03-10 NOTE — Progress Notes (Signed)
EPIC Encounter for ICM Monitoring  Patient Name: David Bright is a 57 y.o. male Date: 03/10/2018 Primary Care Physican: Luetta Nutting, DO Primary Cardiologist:[facility]  Electrophysiologist: Caryl Comes Dry Weight: Previous weight291lbs       Heart Failure questions reviewed, pt asymptomatic.    Thoracic impedance abnormal suggesting fluid accumulation.         Prescribed dosage: Furosemide40 mg 1 tablet twice a day. Potassium 20 mEq 1 tablet daily.   LABS: 01/02/2018 Creatinine 1.21, BUN 33, Potassium 4.6, Sodium 140, EGFR 66-76 Care Everywhere 12/23/2017 Creatinine 1.45, BUN 30, Potassium 5.8, Sodium 138, EGFR 52-60 08/19/2017 Creatinine 1.16, BUN 16, Potassium 4.2, Sodium 137, EGFR >60 01/24/2017 Creatinine 1.16, BUN 27, Potassium 5.2, Sodium 140, Care Everywhere results 01/08/2017 Creatinine 1.20, BUN 29, Potassium 4.5, Sodium 138, EGFR >60 12/27/2016 Creatinine 0.98, BUN 18, Potassium 4.5, Sodium 139, EGFR >60  Recommendations: Advised to increase Lasix to 60 mg bid x 3 days as ordered by Dr Aundra Dubin on 03/08/2018 in response to 03/02/2018 remote transmission.  He will start the increase today.   Follow-up plan: ICM clinic phone appointment on 03/13/2018 to recheck fluid levels (manual send).     Copy of ICM check sent to Dr. Caryl Comes.   3 month ICM trend: 03/10/2018    1 Year ICM trend:       [name], RN 03/10/2018 10:47 AM

## 2018-03-10 NOTE — Progress Notes (Signed)
Call to patient.  Advised Dr Aundra Dubin ordered to increase Lasix to 60 mg bid x 3 days then back to 40 mg bid.  He verbalized understanding.  Will recheck fluid levels on 03/13/2018.

## 2018-03-17 ENCOUNTER — Telehealth: Payer: Self-pay | Admitting: Cardiology

## 2018-03-17 NOTE — Telephone Encounter (Signed)
Should be ok for surgery if no new symptoms.

## 2018-03-17 NOTE — Telephone Encounter (Signed)
   CHF:  Loralie Champagne, MD  57 yo male with systolic HF, non-obs CAD by [facility] in 2016, s/p ICD, PAD treated medically, HTN, HL, DM.  EF has improved to normal.    Chart review suggests patient is now primarily followed in [facility] clinic.  Per pre-op protocol, I will forward to primary MD in HF Clinic for further recommendations regarding surgical risk.  Richardson Dopp, PA-C    03/17/2018 3:37 PM

## 2018-03-17 NOTE — Telephone Encounter (Signed)
° °  [facility] Pre-operative Risk Assessment    Request for surgical clearance:  1. What type of surgery is being performed?  Left shoulder arthroscopy with limited debridement, decompression/acromioplasty, biceps tenodesis & possible rotator cuff repair  2. When is this surgery scheduled?  03/26/2018  3. What type of clearance is required (medical clearance vs. Pharmacy clearance to hold med vs. Both)? Both   4. Are there any medications that need to be held prior to surgery and how long? Please advise which medications need to be held and how long.  5. Practice name and name of physician performing surgery? [facility] [place], Dr. Smitty Cords   6. What is your office phone number [phone] ATTN: Chrisandra Netters.   7.   What is your office fax number [phone]  8.   Anesthesia type (None, local, MAC, general) ? Not listed   David Bright 03/17/2018, 9:24 AM  _________________________________________________________________   (provider comments below)

## 2018-03-18 NOTE — Telephone Encounter (Signed)
   Primary Cardiologist: Ena Dawley, MD  CHF:  Loralie Champagne, MD  Chart reviewed as part of pre-operative protocol coverage.  Patient was contacted 03/18/2018 in reference to pre-operative risk assessment for pending surgery as outlined below.  [name] was last seen on 12/23/2017 by Dr. Aundra Dubin.  Since that day, RYETT HAMMAN has done well without chest pain or significant shortness of breath.  Of note, his thoracic impedance has changed recently based upon ICM clinic data and his Lasix has been adjusted.  RCRI: 0.9% DASI:  4.74 METs  Therefore, based on ACC/AHA guidelines, the patient would be at acceptable risk for the planned procedure without further cardiovascular testing.   Call back staff: I have faxed a letter to the requesting surgeon.  Please contact surgeon to ensure it was received.  Note will be removed from preop pool  [place], PA-C 03/18/2018, 2:12 PM

## 2018-03-19 ENCOUNTER — Telehealth: Payer: Self-pay

## 2018-03-19 NOTE — Telephone Encounter (Signed)
Spoke with patient.  He reported he is having trouble sending ICM remote transmission and does not think the monitor is working.  Advised to call [employer] tech service number for assistance at [phone].  Also provided ICM direct number and encouraged him to use the direct number for ICM future calls.

## 2018-03-19 NOTE — Telephone Encounter (Signed)
Spoke with Baron Hamper from requesting office and she confirmed that she did receive clearance

## 2018-03-19 NOTE — Telephone Encounter (Signed)
Attempted ICM call to patient as requested by message regarding transmission.  Left message to return call with direct ICM number for call.

## 2018-03-19 NOTE — Telephone Encounter (Signed)
Pt called [facility] office trying to return phone call from [place]. Margarita Grizzle, pt states he is available now to speak with you.

## 2018-03-20 ENCOUNTER — Telehealth: Payer: Self-pay

## 2018-03-20 NOTE — Telephone Encounter (Signed)
Patient left voice mail message stating [facility] will be sending him a new monitor.  He will call when he receives it.

## 2018-03-23 ENCOUNTER — Other Ambulatory Visit (HOSPITAL_COMMUNITY): Payer: Self-pay | Admitting: *Deleted

## 2018-03-23 MED ORDER — CARVEDILOL 12.5 MG PO TABS: 18.7500 mg | ORAL_TABLET | Freq: Two times a day (BID) | ORAL | 3 refills | Status: DC

## 2018-03-24 ENCOUNTER — Telehealth: Payer: Self-pay

## 2018-03-24 NOTE — Telephone Encounter (Signed)
Returned call to patient as requested by voice mail message regarding if transmission was received.  Left detailed message the transmission was received from new monitor. Advised will repeat transmission on 04/02/2018.

## 2018-03-28 LAB — CUP PACEART REMOTE DEVICE CHECK
Battery Voltage: 3.02 V
Brady Statistic RV Percent Paced: 0.01 %
Date Time Interrogation Session: 20190722083723
HIGH POWER IMPEDANCE MEASURED VALUE: 74 Ohm
Implantable Lead Location: 753860
Lead Channel Impedance Value: 380 Ohm
Lead Channel Sensing Intrinsic Amplitude: 12.375 mV
Lead Channel Setting Pacing Amplitude: 2.5 V
MDC IDC LEAD IMPLANT DT: 20160725
MDC IDC LEAD SERIAL: 383593
MDC IDC MSMT BATTERY REMAINING LONGEVITY: 118 mo
MDC IDC MSMT LEADCHNL RV IMPEDANCE VALUE: 399 Ohm
MDC IDC MSMT LEADCHNL RV PACING THRESHOLD AMPLITUDE: 0.875 V
MDC IDC MSMT LEADCHNL RV PACING THRESHOLD PULSEWIDTH: 0.4 ms
MDC IDC MSMT LEADCHNL RV SENSING INTR AMPL: 12.375 mV
MDC IDC PG IMPLANT DT: 20160725
MDC IDC SET LEADCHNL RV PACING PULSEWIDTH: 0.4 ms
MDC IDC SET LEADCHNL RV SENSING SENSITIVITY: 0.3 mV

## 2018-03-31 ENCOUNTER — Telehealth: Payer: Self-pay

## 2018-03-31 NOTE — Telephone Encounter (Signed)
Returned patient call as requested by voice mail.  Patient reported [facility] PCP will not clear him for shoulder surgery until he follows up with cardiologist regarding today's lab results showing a decrease in kidney function.  Per telephone note 8/6, patient was cleared by cardiology to have surgery. He is unsure what to do to get another cardiology clearance for the surgery.  The surgery is shoulder arthrocopy with limited debridement, decompression/acromioplasty, biceps tenodesis & possible rotator cuff repair per 8/6 telephone note.  Advised will forward his request to [facility] clinic for follow up.  Patient's last HF clinic visit was 12/23/2017.

## 2018-04-01 NOTE — Telephone Encounter (Signed)
Spoke w/pt, advised we cleared him for surgery on 8/6.  He states he had labs done at PCP and they told him he needed clearance from [place], he states he is going to call the surgeons office tomorrow, if they need more info from [place] he will let us know.

## 2018-04-02 ENCOUNTER — Ambulatory Visit (INDEPENDENT_AMBULATORY_CARE_PROVIDER_SITE_OTHER): Payer: Medicaid Other

## 2018-04-02 DIAGNOSIS — Z95810 Presence of automatic (implantable) cardiac defibrillator: Secondary | ICD-10-CM | POA: Diagnosis not present

## 2018-04-02 DIAGNOSIS — I5042 Chronic combined systolic (congestive) and diastolic (congestive) heart failure: Secondary | ICD-10-CM

## 2018-04-02 NOTE — Progress Notes (Signed)
EPIC Encounter for ICM Monitoring  Patient Name: David Bright is a 57 y.o. male Date: 04/02/2018 Primary Care Physican: Luetta Nutting, DO Primary Cardiologist:[facility]  Electrophysiologist:Klein Dry Weight:282.6lbs     Heart Failure questions reviewed, pt asymptomatic.   Thoracic impedance normal.        Prescribed dosage:  Furosemide40 mg 1 tablet twice a day. Potassium 20 mEq 1 tablet daily.   LABS: 03/27/2018 Creatinine 1.80, BUN 48, Potassium 4.6, Sodium 139, EGFR 41-47 03/17/2018 Creatinine 1.87, BUN 52, Potassium 4.7, Sodium 139, EGFR 39-45 01/02/2018 Creatinine 1.21, BUN 33, Potassium 4.6, Sodium 140, EGFR 66-76 Care Everywhere 12/23/2017 Creatinine 1.45, BUN 30, Potassium 5.8, Sodium 138, EGFR 52-60 08/19/2017 Creatinine 1.16, BUN 16, Potassium 4.2, Sodium 137, EGFR >60 01/24/2017 Creatinine 1.16, BUN 27, Potassium 5.2, Sodium 140, Care Everywhere results 01/08/2017 Creatinine 1.20, BUN 29, Potassium 4.5, Sodium 138, EGFR >60 12/27/2016 Creatinine 0.98, BUN 18, Potassium 4.5, Sodium 139, EGFR >60  Recommendations: No changes.   Encouraged to call for fluid symptoms.  Follow-up plan: ICM clinic phone appointment on 05/04/2018.       Copy of ICM check sent to Dr. Caryl Comes.   3 month ICM trend: 04/02/2018    1 Year ICM trend:       [name], RN 04/02/2018 12:26 PM

## 2018-04-06 ENCOUNTER — Other Ambulatory Visit (HOSPITAL_COMMUNITY): Payer: Self-pay | Admitting: Cardiology

## 2018-05-04 ENCOUNTER — Ambulatory Visit (INDEPENDENT_AMBULATORY_CARE_PROVIDER_SITE_OTHER): Payer: Medicare Other

## 2018-05-04 DIAGNOSIS — Z95810 Presence of automatic (implantable) cardiac defibrillator: Secondary | ICD-10-CM

## 2018-05-04 DIAGNOSIS — I5042 Chronic combined systolic (congestive) and diastolic (congestive) heart failure: Secondary | ICD-10-CM | POA: Diagnosis not present

## 2018-05-04 NOTE — Progress Notes (Signed)
EPIC Encounter for ICM Monitoring  Patient Name: David Bright is a 57 y.o. male Date: 05/04/2018 Primary Care Physican: Luetta Nutting, DO Primary Cardiologist:[facility]   Electrophysiologist:Klein Dry Weight:Previous weight 282.6lbs      Attempted call to patient and unable to reach.  Left detailed message, per DPR, regarding transmission.  Transmission reviewed.    Thoracic impedance normal.        Prescribed: Furosemide40 mg 1 tablet twice a day. Potassium 20 mEq 1 tablet daily.   LABS: 03/27/2018 Creatinine 1.80, BUN 48, Potassium 4.6, Sodium 139, EGFR 41-47 03/17/2018 Creatinine 1.87, BUN 52, Potassium 4.7, Sodium 139, EGFR 39-45 01/02/2018 Creatinine 1.21, BUN 33, Potassium 4.6, Sodium 140, EGFR 66-76 Care Everywhere 12/23/2017 Creatinine 1.45, BUN 30, Potassium 5.8, Sodium 138, EGFR 52-60 08/19/2017 Creatinine 1.16, BUN 16, Potassium 4.2, Sodium 137, EGFR >60  Recommendations: Left voice mail with ICM number and encouraged to call if experiencing any fluid symptoms.  Follow-up plan: ICM clinic phone appointment on 06/04/2018.    Copy of ICM check sent to Dr. Caryl Comes.   3 month ICM trend: 05/04/2018    1 Year ICM trend:       [name], RN 05/04/2018 2:53 PM

## 2018-05-05 ENCOUNTER — Telehealth: Payer: Self-pay

## 2018-05-05 NOTE — Telephone Encounter (Signed)
Remote ICM transmission received.  Attempted call to patient and left detailed message, per DPR, regarding transmission and next ICM scheduled for 06/04/2018.  Advised to return call for any fluid symptoms or questions.    

## 2018-05-06 ENCOUNTER — Telehealth (HOSPITAL_COMMUNITY): Payer: Self-pay

## 2018-05-06 NOTE — Telephone Encounter (Signed)
If he is not dizzy and systolic BP > 90, no changes.  But would get a BMET.

## 2018-05-06 NOTE — Telephone Encounter (Signed)
Pt called to state that he is having some low bp readings (97/55). Pt is currently on carvedilol 12.5 (1.5 tab twice daily). Please advise.

## 2018-05-07 NOTE — Telephone Encounter (Signed)
Left VM for pt to call.

## 2018-05-08 ENCOUNTER — Ambulatory Visit (HOSPITAL_COMMUNITY)
Admission: RE | Admit: 2018-05-08 | Discharge: 2018-05-08 | Disposition: A | Discharge status: Home / Self Care | Payer: Medicare Other | Source: Ambulatory Visit | Attending: Cardiology | Admitting: Cardiology

## 2018-05-08 ENCOUNTER — Telehealth (HOSPITAL_COMMUNITY): Payer: Self-pay | Admitting: Cardiology

## 2018-05-08 DIAGNOSIS — I5042 Chronic combined systolic (congestive) and diastolic (congestive) heart failure: Secondary | ICD-10-CM | POA: Insufficient documentation

## 2018-05-08 LAB — BASIC METABOLIC PANEL
Anion gap: 10 (ref 5–15)
BUN: 56 mg/dL — AB (ref 6–20)
CHLORIDE: 102 mmol/L (ref 98–111)
CO2: 25 mmol/L (ref 22–32)
Calcium: 9.7 mg/dL (ref 8.9–10.3)
Creatinine, Ser: 1.9 mg/dL — ABNORMAL HIGH (ref 0.61–1.24)
GFR calc Af Amer: 43 mL/min — ABNORMAL LOW (ref >60)
GFR calc non Af Amer: 37 mL/min — ABNORMAL LOW (ref >60)
GLUCOSE: 146 mg/dL — AB (ref 70–99)
POTASSIUM: 5 mmol/L (ref 3.5–5.1)
Sodium: 137 mmol/L (ref 135–145)

## 2018-05-08 NOTE — Telephone Encounter (Signed)
Patient returned call to report extreme dizziness. Unable to get b/p cuff at [facility] so he is unsure of b/p during the time of call.   Advised to hold coreg x 2 doses, get b/p cuff at local medical supply company. Return call if SBP is continually  below 90 and or he becomes more symptomatic   Pt voiced understanding

## 2018-05-08 NOTE — Telephone Encounter (Signed)
During patients lab appt, patient request to have b/p checked. Patient reports during other appts his b/p readings have been lower 110-90 / 70's  B/p today 92/62  Advised as long as he does not become symptomatic he should continue all meds, however if he does get lightheaded or dizzy he can have a medication adjustments.  He can also take b/p daily x 1 for 7-10 days, and call office with results. (order for b/p cuff given to patient)

## 2018-05-11 MED ORDER — FUROSEMIDE 40 MG PO TABS: 40.0000 mg | ORAL_TABLET | Freq: Every day | ORAL | 3 refills | Status: DC

## 2018-05-11 NOTE — Telephone Encounter (Signed)
-----   Message from Larey Dresser, MD sent at 05/08/2018  3:33 PM EDT ----- Creatinine up.  Stop any supplemental K.  Hold Lasix for 1 day, then decrease from 40 mg bid to 40 mg daily.  Arrange followup with APP to reassess volume.  Repeat BMET 1 week.

## 2018-05-11 NOTE — Telephone Encounter (Signed)
Notes recorded by Harvie Junior, CMA on 05/11/2018 at 3:11 PM EDT Pt aware and agreeable with plan. ------  Notes recorded by Kerry Dory, CMA on 05/08/2018 at 3:52 PM EDT  Left message for patient to call back.  [phone] (H) ------  Notes recorded by Larey Dresser, MD on 05/08/2018 at 3:33 PM EDT Creatinine up. Stop any supplemental K. Hold Lasix for 1 day, then decrease from 40 mg bid to 40 mg daily. Arrange followup with APP to reassess volume. Repeat BMET 1 week.

## 2018-05-25 NOTE — Progress Notes (Signed)
PCP: [facility] Cardiology: Dr. Aundra Dubin  57 y.o. with history of chronic systolic CHF and COPD/active smoking presents for followup of CHF.  He has a known nonischemic cardiomyopathy, echo in 1/17 with EF 20% and a severely dilated LV.  No coronary disease on 7/16 cardiac cath.  Repeat echo was 2/18, showing EF up to 40%.   He is trying stop smoking with Chantix, down to 2-3 cigs/day. Still with leg fatigue/cramping after walking 100 yards.  He had significant PAD by peripheral arterial dopplers, saw Dr. Gwenlyn Found with plan of medical management for now. He also has knee pain which is limiting.   Echo 09/2017: EF up to 50-55% with mild LVH.   He returns today for HF follow up. Lasix has been adjusted based on symptoms and optivol. Creatinine elevated to 1.9 9/27, so lasix was decreased from 40 mg BID to 40 mg daily. K was also stopped with K 5.0. Overall doing well. Denies SOB, orthopnea, PND, or edema. No CP. He is dizzy in the mornings after taking all his medications. He is sometimes dizzy in the afternoon as well. It can occur when he first stands up or when he is walking. Orthostatics negative today. He continues to have pain in calves when walking, none at rest. He rarely eats out and sticks to low salt diet at home. Drinks around 2 L of fluid. Smokes 2-3 cig/day. Taking all medication. He has not taken meds yet this morning. SBP 90s when seen at PCP office when he does take his medication.  [employer] device interrogated: thoracic impedence above threshold and trending up, no VT/VF, no AF, active 1-2 hours/day.   Labs (12/17): K 4.3, creatinine 1.09 Labs (1/18): K 3.9, creatinine 1.13 Labs (3/18): K 4.3, creatinine 1.48, NT-proBNP 61 Labs (4/18): K 4.5, creatinine 1.23, LDL 71, BNP 34 Labs (5/18): K 4.5, creatinine 1.2 Labs (1/19): K 4.2, creatinine 1.19, LDL 69, HDL 39  PMH:  1. Chronic systolic CHF: Nonischemic cardiomyopathy.  Has Medtronic ICD.   - LHC (7/16) with nonobstructive  disease.  - Echo (1/17) with EF 20%, severe LV dilation, mild to moderate MR.  - Echo (2/18): EF 40%, mild LV dilation.  - Echo (5/19): EF 50-55%, mild LVH 2. COPD: Active smoker.  - PFTs (2/18) with minimal obstruction, mild restriction suggesting a parenchymal process, low DLCO.  - CT chest (6/18): No ILD, +emphysema, lung nodules.  3. Type II diabetes with peripheral neuropathy.  4. OSA on CPAP 5. HTN 6. Hyperlipidemia 7. PAD: ABIs abnormal at outside office.  - Peripheral arterial dopplers (3/18) with > 50% left CIA and bilateral EIA stenosis, 50-74% mid LSFA stenosis.  - ABIs (2/19): 0.85 right, 0.86 left (mild).   SH: Lives in [place], works at [employer] and [employer], smokes about 2-3 cigs/day now.  Rare ETOH.  Remote substance abuse.   FH: Aunt with CHF, brother with ESRD.    Review of systems complete and found to be negative unless listed in HPI.   Current Outpatient Medications  Medication Sig Dispense Refill  . aspirin EC 81 MG tablet Take 1 tablet (81 mg total) by mouth daily. 30 tablet 0  . atorvastatin (LIPITOR) 20 MG tablet Take 1 tablet (20 mg total) by mouth daily. 30 tablet 3  . canagliflozin (INVOKANA) 300 MG TABS tablet Take 300 mg by mouth daily before breakfast.    . carvedilol (COREG) 12.5 MG tablet Take 1.5 tablets (18.75 mg total) by mouth 2 (two) times daily with  a meal. 90 tablet 3  . ENTRESTO 97-103 MG TAKE 1 TABLET BY MOUTH TWICE DAILY 60 tablet 6  . furosemide (LASIX) 40 MG tablet Take 1 tablet (40 mg total) by mouth daily. 90 tablet 3  . isosorbide-hydrALAZINE (BIDIL) 20-37.5 MG tablet Take 1 tablet by mouth 2 (two) times daily.    . isosorbide-hydrALAZINE (BIDIL) 20-37.5 MG tablet Take 1 tablet by mouth 2 (two) times daily.    . ivabradine (CORLANOR) 5 MG TABS tablet Take 1 tablet (5 mg total) by mouth 2 (two) times daily with a meal. 60 tablet 10  . mesalamine (LIALDA) 1.2 g EC tablet Take 1.2 g 2 (two) times daily by mouth.    . sildenafil (REVATIO) 20  MG tablet Take 20 mg by mouth. Pt reported 12/23/16 he takes 2-5 tablets prn for sexual activity.    . sitaGLIPtin-metformin (JANUMET) 50-1000 MG tablet Take 1 tablet by mouth 2 (two) times daily with a meal.    . spironolactone (ALDACTONE) 25 MG tablet Take 1 tablet (25 mg total) by mouth daily. 90 tablet 2   No current facility-administered medications for this encounter.    BP 110/70   Pulse 85   Wt 126.1 kg (278 lb)   SpO2 99%   BMI 37.70 kg/m  General: Well appearing. No resp difficulty. HEENT: Normal Neck: Supple. JVP 5-6. Carotids 2+ bilat; no bruits. No thyromegaly or nodule noted. Cor: PMI nondisplaced. RRR, No M/G/R noted Lungs: clear, diminished throughout Abdomen: Soft, non-tender, non-distended, no HSM. No bruits or masses. +BS  Extremities: No cyanosis, clubbing, or rash. R and LLE no edema.  Neuro: Alert & orientedx3, cranial nerves grossly intact. moves all 4 extremities w/o difficulty. Affect pleasant  Orthostatics: Sitting: 108/72 Standing: 102/72  Assessment/Plan: 1. Chronic systolic CHF: Nonischemic cardiomyopathy => ?due to viral myocarditis or prior substance abuse.  Medtronic ICD.  Echo 1/17 with severely dilated LV and EF 20%, EF up to 40% on echo 2/18 and up to 50-55%.  NYHA class II symptoms.  Volume status stable on exam and on optivol. Weight down 13 lbs since May. - Change lasix 40 mg to PRN. Discussed when to take.  - Continue Entresto 97/103 bid - Continue ivabradine 5 mg BID - Continue spironolactone 25 mg daily. Take at night to decrease dizziness. - Continue Coreg 18.75 mg bid.  - Continue Bidil 1 tab tid. - Discussed importance of daily weights, limiting fluid, and limiting salt intake.  - Repeat echo with Dr Aundra Dubin in 3 months 2. Smoking/suspect COPD:  PFTs done, somewhat concerning for interstitial lung disease rather than emphysema with restrictive PFTs and decreased DLCO => no ILD on CT, emphysema noted along with lung nodules.   - Continues  to smoke 2-3 cigarettes/day.  - Due for CT chest in 6/19 to follow lung nodules.  Follow up on this next visit.  3. OSA: Continue CPAP. No change.  4. PAD: Significant disease on peripheral dopplers, stable mild claudication. He saw Dr. Gwenlyn Found, medical management recommended.  I encouraged him to try to walk at least a short distance through the pain.  - Continue ASA 81 and statin. Good lipids in 1/19.   - Follows with Dr. Gwenlyn Found.    Change lasix to PRN with elevated creatinine and dizziness Take spiro at bedtime instead of in am.  Schedule echo with Dr Aundra Dubin in 3 months Follow up with APP in 3 weeks to check on volume  Georgiana Shore 05/26/2018   Greater than 50%  of the 25 minute visit was spent in counseling/coordination of care regarding disease state education, salt/fluid restriction, sliding scale diuretics, and medication compliance.

## 2018-05-26 ENCOUNTER — Ambulatory Visit (HOSPITAL_COMMUNITY)
Admission: RE | Admit: 2018-05-26 | Discharge: 2018-05-26 | Disposition: A | Discharge status: Home / Self Care | Payer: Medicare Other | Source: Ambulatory Visit | Attending: Cardiology | Admitting: Cardiology

## 2018-05-26 ENCOUNTER — Encounter (HOSPITAL_COMMUNITY): Payer: Self-pay

## 2018-05-26 VITALS — BP 110/70 | HR 85 | Wt 278.0 lb

## 2018-05-26 DIAGNOSIS — E1142 Type 2 diabetes mellitus with diabetic polyneuropathy: Secondary | ICD-10-CM | POA: Insufficient documentation

## 2018-05-26 DIAGNOSIS — E785 Hyperlipidemia, unspecified: Secondary | ICD-10-CM | POA: Insufficient documentation

## 2018-05-26 DIAGNOSIS — G4733 Obstructive sleep apnea (adult) (pediatric): Secondary | ICD-10-CM | POA: Diagnosis not present

## 2018-05-26 DIAGNOSIS — I428 Other cardiomyopathies: Secondary | ICD-10-CM | POA: Diagnosis not present

## 2018-05-26 DIAGNOSIS — Z8249 Family history of ischemic heart disease and other diseases of the circulatory system: Secondary | ICD-10-CM | POA: Diagnosis not present

## 2018-05-26 DIAGNOSIS — I5042 Chronic combined systolic (congestive) and diastolic (congestive) heart failure: Secondary | ICD-10-CM

## 2018-05-26 DIAGNOSIS — E1151 Type 2 diabetes mellitus with diabetic peripheral angiopathy without gangrene: Secondary | ICD-10-CM | POA: Diagnosis not present

## 2018-05-26 DIAGNOSIS — Z7984 Long term (current) use of oral hypoglycemic drugs: Secondary | ICD-10-CM | POA: Insufficient documentation

## 2018-05-26 DIAGNOSIS — I5022 Chronic systolic (congestive) heart failure: Secondary | ICD-10-CM | POA: Diagnosis present

## 2018-05-26 DIAGNOSIS — F17210 Nicotine dependence, cigarettes, uncomplicated: Secondary | ICD-10-CM | POA: Insufficient documentation

## 2018-05-26 DIAGNOSIS — Z7982 Long term (current) use of aspirin: Secondary | ICD-10-CM | POA: Insufficient documentation

## 2018-05-26 DIAGNOSIS — Z79899 Other long term (current) drug therapy: Secondary | ICD-10-CM | POA: Insufficient documentation

## 2018-05-26 DIAGNOSIS — I110 Hypertensive heart disease with heart failure: Secondary | ICD-10-CM | POA: Diagnosis not present

## 2018-05-26 DIAGNOSIS — Z95810 Presence of automatic (implantable) cardiac defibrillator: Secondary | ICD-10-CM | POA: Diagnosis not present

## 2018-05-26 DIAGNOSIS — J449 Chronic obstructive pulmonary disease, unspecified: Secondary | ICD-10-CM | POA: Diagnosis not present

## 2018-05-26 DIAGNOSIS — R42 Dizziness and giddiness: Secondary | ICD-10-CM | POA: Insufficient documentation

## 2018-05-26 DIAGNOSIS — I739 Peripheral vascular disease, unspecified: Secondary | ICD-10-CM | POA: Diagnosis not present

## 2018-05-26 LAB — BASIC METABOLIC PANEL
ANION GAP: 10 (ref 5–15)
BUN: 33 mg/dL — ABNORMAL HIGH (ref 6–20)
CHLORIDE: 101 mmol/L (ref 98–111)
CO2: 26 mmol/L (ref 22–32)
Calcium: 9.3 mg/dL (ref 8.9–10.3)
Creatinine, Ser: 1.4 mg/dL — ABNORMAL HIGH (ref 0.61–1.24)
GFR calc non Af Amer: 54 mL/min — ABNORMAL LOW (ref >60)
Glucose, Bld: 128 mg/dL — ABNORMAL HIGH (ref 70–99)
Potassium: 4.3 mmol/L (ref 3.5–5.1)
Sodium: 137 mmol/L (ref 135–145)

## 2018-05-26 MED ORDER — FUROSEMIDE 40 MG PO TABS: 40.0000 mg | ORAL_TABLET | ORAL | 3 refills | Status: DC | PRN

## 2018-05-26 MED ORDER — SPIRONOLACTONE 25 MG PO TABS: 25.0000 mg | ORAL_TABLET | Freq: Every day | ORAL | 2 refills | Status: DC

## 2018-05-26 NOTE — Patient Instructions (Addendum)
CHANGE Lasix to 40 mg AS NEEDED for weight gain ( 3lbs overnight or 5 lbs in a week) or swelling CHANGE Spironolactone to 25 mg, daily at BEDTIME  Labs today We will only contact you if something comes back abnormal or we need to make some changes. Otherwise no news is good news!  Your physician recommends that you schedule a follow-up appointment in: 3 weeks  in the [facility] Practition[facility] (PA/NP) Clinic     Your physician recommends that you schedule a follow-up appointment in: 3 months with Dr Aundra Dubin and echo  Your physician has requested that you have an echocardiogram. Echocardiography is a painless test that uses sound waves to create images of your heart. It provides your doctor with information about the size and shape of your heart and how well your heart's chambers and valves are working. This procedure takes approximately one hour. There are no restrictions for this procedure.  Do the following things EVERYDAY: 1) Weigh yourself in the morning before breakfast. Write it down and keep it in a log. 2) Take your medicines as prescribed 3) Eat low salt foods-Limit salt (sodium) to 2000 mg per day.  4) Stay as active as you can everyday 5) Limit all fluids for the day to less than 2 liters

## 2018-06-04 ENCOUNTER — Ambulatory Visit (INDEPENDENT_AMBULATORY_CARE_PROVIDER_SITE_OTHER): Payer: Medicare Other

## 2018-06-04 ENCOUNTER — Ambulatory Visit (INDEPENDENT_AMBULATORY_CARE_PROVIDER_SITE_OTHER): Payer: Medicare Other | Admitting: *Deleted

## 2018-06-04 ENCOUNTER — Telehealth: Payer: Self-pay

## 2018-06-04 DIAGNOSIS — Z95810 Presence of automatic (implantable) cardiac defibrillator: Secondary | ICD-10-CM

## 2018-06-04 DIAGNOSIS — I5042 Chronic combined systolic (congestive) and diastolic (congestive) heart failure: Secondary | ICD-10-CM

## 2018-06-04 DIAGNOSIS — I428 Other cardiomyopathies: Secondary | ICD-10-CM

## 2018-06-04 NOTE — Progress Notes (Signed)
Remote ICD transmission.   

## 2018-06-04 NOTE — Telephone Encounter (Signed)
Remote ICM transmission received.  Attempted call to patient regarding ICM remote transmission and left message to return call   

## 2018-06-04 NOTE — Progress Notes (Signed)
EPIC Encounter for ICM Monitoring  Patient Name: David Bright is a 57 y.o. male Date: 06/04/2018 Primary Care Physican: Luetta Nutting, DO Primary Cardiologist:[facility]   Electrophysiologist:Klein Dry Weight:Previous weight 282.6lbs       Attempted call to patient and unable to reach.  Left message to return call regarding transmission.  Transmission reviewed.    Thoracic impedance abnormal suggesting fluid accumulation starting 05/26/2018.         Prescribed: Furosemide40 mg 1 tablet PRN Potassium 20 mEq 1 tablet daily.   LABS: 03/27/2018 Creatinine 1.80, BUN 48, Potassium 4.6, Sodium 139, EGFR 41-47 03/17/2018 Creatinine 1.87, BUN 52, Potassium 4.7, Sodium 139, EGFR 39-45 01/02/2018 Creatinine 1.21, BUN 33, Potassium 4.6, Sodium 140, EGFR 66-76 Care Everywhere 12/23/2017 Creatinine 1.45, BUN 30, Potassium 5.8, Sodium 138, EGFR 52-60 08/19/2017 Creatinine 1.16, BUN 16, Potassium 4.2, Sodium 137, EGFR >60   Recommendations: Will advise patient to take Furosemide 40 mg 1 tablet x 3 days and then return to PRN if reached.  Follow-up plan: ICM clinic phone appointment on 06/15/2018 to recheck fluid levels.   Office appointment scheduled 06/17/2018 with HF clinic NP/PA.    Copy of ICM check sent to Dr. Caryl Comes and Dr Aundra Dubin.   3 month ICM trend: 06/04/2018    1 Year ICM trend:       [name], RN 06/04/2018 3:52 PM

## 2018-06-05 ENCOUNTER — Telehealth (HOSPITAL_COMMUNITY): Payer: Self-pay | Admitting: Pharmacist

## 2018-06-05 NOTE — Telephone Encounter (Signed)
Delene Loll PA on file with [employer] [phone]).   Ruta Hinds. Velva Harman, PharmD, BCPS, CPP Clinical Pharmacist Phone: [phone] 06/05/2018 12:08 PM

## 2018-06-10 NOTE — Progress Notes (Signed)
Attempted to return patient call as requested by voice mail message.  Left detailed message per DPR and advised to call Dr Claris Gladden office if he has any fluid symptoms this week since I will be out of the office.

## 2018-06-15 ENCOUNTER — Ambulatory Visit (INDEPENDENT_AMBULATORY_CARE_PROVIDER_SITE_OTHER): Payer: Medicare Other

## 2018-06-15 DIAGNOSIS — I5042 Chronic combined systolic (congestive) and diastolic (congestive) heart failure: Secondary | ICD-10-CM

## 2018-06-15 DIAGNOSIS — Z95810 Presence of automatic (implantable) cardiac defibrillator: Secondary | ICD-10-CM

## 2018-06-15 NOTE — Progress Notes (Signed)
EPIC Encounter for ICM Monitoring  Patient Name: David Bright is a 57 y.o. male Date: 06/15/2018 Primary Care Physican: Matthews, Cody, DO Primary Cardiologist:[facility]  Electrophysiologist:Klein Dry Weight:283.2lbs       Heart Failure questions reviewed, pt asymptomatic.  Patient has been drinking a lot of fluids and not adhering to low salt.     Thoracic impedance abnormal suggesting fluid accumulation starting 05/26/2018.         Prescribed: Furosemide40 mg 1 tablet PRN Potassium 20 mEq 1 tablet daily.   LABS: 03/27/2018 Creatinine 1.80, BUN 48, Potassium 4.6, Sodium 139, EGFR 41-47 03/17/2018 Creatinine 1.87, BUN 52, Potassium 4.7, Sodium 139, EGFR 39-45 01/02/2018 Creatinine 1.21, BUN 33, Potassium 4.6, Sodium 140, EGFR 66-76 Care Everywhere 12/23/2017 Creatinine 1.45, BUN 30, Potassium 5.8, Sodium 138, EGFR 52-60 08/19/2017 Creatinine 1.16, BUN 16, Potassium 4.2, Sodium 137, EGFR >60   Recommendations: Patient started taking PRN Furosemide 40 mg 1 tablet twice yesterday and once today because he has been drinking a lot of fluid and eating foods high in salt.   Follow-up plan: ICM clinic phone appointment on 07/06/2018 to recheck fluid levels.   Office appointment scheduled 06/17/2018 with HF Clinic NP/PA.    Copy of ICM check sent to Dr. Klein and [facility].   3 month ICM trend: 06/15/2018    1 Year ICM trend:       Laurie S Short, RN 06/15/2018 12:21 PM   

## 2018-06-16 ENCOUNTER — Encounter: Payer: Medicaid Other | Admitting: Internal Medicine

## 2018-06-16 NOTE — Progress Notes (Signed)
PCP: [facility] Cardiology: Dr. Aundra Dubin  57 y.o. with history of chronic systolic CHF and COPD/active smoking presents for followup of CHF.  He has a known nonischemic cardiomyopathy, echo in 1/17 with EF 20% and a severely dilated LV.  No coronary disease on 7/16 cardiac cath.  Repeat echo was 2/18, showing EF up to 40%.   He is trying stop smoking with Chantix, down to 2-3 cigs/day. Still with leg fatigue/cramping after walking 100 yards.  He had significant PAD by peripheral arterial dopplers, saw Dr. Gwenlyn Found with plan of medical management for now. He also has knee pain which is limiting.   Echo 09/2017: EF up to 50-55% with mild LVH.   He returns today for 3 week follow up. Lasix changed to PRN last visit with dizziness and AKI. Overall feeling fine. Optivol showed increased volume since lasix was changed to PRN. He has restarted lasix at 40 mg BID for 3 days. Weight has started to trend down, from 285 > 281 lbs. He is still up 5 lbs on our scale. He is SOB after walking a long distance on flat ground. Denies orthopnea, PND, or edema. Wearing CPAP qHS. No more dizziness. No CP. He cooks a lot of his meals, but eats high salt foods occasionally (cheeseburgers and stromboli). Drinks around 2 L of fluid. Continues to smoke 1 pack/week. He decreased his coreg from 18.75 to 12.5 mg BID and has not had any more dizziness. Has not taken meds yet this am.   Optivol: volume trending up since 10/15. Active ~2 hours/day. No AF. No VT/VF.   Labs (12/17): K 4.3, creatinine 1.09 Labs (1/18): K 3.9, creatinine 1.13 Labs (3/18): K 4.3, creatinine 1.48, NT-proBNP 61 Labs (4/18): K 4.5, creatinine 1.23, LDL 71, BNP 34 Labs (5/18): K 4.5, creatinine 1.2 Labs (1/19): K 4.2, creatinine 1.19, LDL 69, HDL 39  PMH:  1. Chronic systolic CHF: Nonischemic cardiomyopathy.  Has Medtronic ICD.   - LHC (7/16) with nonobstructive disease.  - Echo (1/17) with EF 20%, severe LV dilation, mild to moderate MR.  - Echo  (2/18): EF 40%, mild LV dilation.  - Echo (5/19): EF 50-55%, mild LVH 2. COPD: Active smoker.  - PFTs (2/18) with minimal obstruction, mild restriction suggesting a parenchymal process, low DLCO.  - CT chest (6/18): No ILD, +emphysema, lung nodules.  3. Type II diabetes with peripheral neuropathy.  4. OSA on CPAP 5. HTN 6. Hyperlipidemia 7. PAD: ABIs abnormal at outside office.  - Peripheral arterial dopplers (3/18) with > 50% left CIA and bilateral EIA stenosis, 50-74% mid LSFA stenosis.  - ABIs (2/19): 0.85 right, 0.86 left (mild).   SH: Lives in [place], works at [employer] and [employer], smokes about 2-3 cigs/day now.  Rare ETOH.  Remote substance abuse.   FH: Aunt with CHF, brother with ESRD.    Review of systems complete and found to be negative unless listed in HPI.   Current Outpatient Medications  Medication Sig Dispense Refill  . aspirin EC 81 MG tablet Take 1 tablet (81 mg total) by mouth daily. 30 tablet 0  . atorvastatin (LIPITOR) 20 MG tablet Take 1 tablet (20 mg total) by mouth daily. 30 tablet 3  . canagliflozin (INVOKANA) 300 MG TABS tablet Take 300 mg by mouth daily before breakfast.    . carvedilol (COREG) 12.5 MG tablet Take 1.5 tablets (18.75 mg total) by mouth 2 (two) times daily with a meal. (Patient taking differently: Take 12.5 mg by mouth  2 (two) times daily with a meal. ) 90 tablet 3  . ENTRESTO 97-103 MG TAKE 1 TABLET BY MOUTH TWICE DAILY 60 tablet 6  . furosemide (LASIX) 40 MG tablet Take 1 tablet (40 mg total) by mouth 2 (two) times daily for 3 days, THEN 1 tablet (40 mg total) daily. 90 tablet 3  . isosorbide-hydrALAZINE (BIDIL) 20-37.5 MG tablet Take 1 tablet by mouth 2 (two) times daily.    . isosorbide-hydrALAZINE (BIDIL) 20-37.5 MG tablet Take 1 tablet by mouth 2 (two) times daily.    . ivabradine (CORLANOR) 5 MG TABS tablet Take 1 tablet (5 mg total) by mouth 2 (two) times daily with a meal. 60 tablet 10  . mesalamine (LIALDA) 1.2 g EC tablet Take 1.2 g 2  (two) times daily by mouth.    . sildenafil (REVATIO) 20 MG tablet Take 20 mg by mouth. Pt reported 12/23/16 he takes 2-5 tablets prn for sexual activity.    . sitaGLIPtin-metformin (JANUMET) 50-1000 MG tablet Take 1 tablet by mouth 2 (two) times daily with a meal.    . spironolactone (ALDACTONE) 25 MG tablet Take 1 tablet (25 mg total) by mouth at bedtime. 90 tablet 2   No current facility-administered medications for this encounter.    BP 122/70   Pulse 87   Wt 128.5 kg (283 lb 3.2 oz)   SpO2 98%   BMI 38.41 kg/m    Wt Readings from Last 3 Encounters:  06/17/18 128.5 kg (283 lb 3.2 oz)  05/26/18 126.1 kg (278 lb)  12/23/17 132.2 kg (291 lb 8 oz)   General: No resp difficulty. HEENT: Normal Neck: Supple. JVP difficult. Carotids 2+ bilat; no bruits. No thyromegaly or nodule noted. Cor: PMI nondisplaced. RRR, No M/G/R noted Lungs: clear, diminished throughout Abdomen: Soft, non-tender, non-distended, no HSM. No bruits or masses. +BS  Extremities: No cyanosis, clubbing, or rash. R and LLE no edema.  Neuro: Alert & orientedx3, cranial nerves grossly intact. moves all 4 extremities w/o difficulty. Affect pleasant  Assessment/Plan: 1. Chronic systolic CHF: Nonischemic cardiomyopathy => ?due to viral myocarditis or prior substance abuse.  Medtronic ICD.  Echo 1/17 with severely dilated LV and EF 20%, EF up to 40% on echo 2/18 and up to 50-55%.  NYHA class II symptoms.  Volume status elevated on optivol and weight up 5 lbs. Exam is difficult. - Continue lasix 40 mg BID x 3 more days, then back to 40 mg daily. BMET, BNP today.  - Continue Entresto 97/103 bid - Continue ivabradine 5 mg BID - Continue spironolactone 25 mg qHS - Continue Coreg 12.5 mg bid. Did not tolerate higher doses with dizziness. - Continue Bidil 1 tab tid. - Discussed importance of daily weights, limiting fluid, and limiting salt intake.  - Repeat echo with Dr Aundra Dubin scheduled for January.  2. Smoking/suspect COPD:   PFTs done, somewhat concerning for interstitial lung disease rather than emphysema with restrictive PFTs and decreased DLCO => no ILD on CT, emphysema noted along with lung nodules.   - Continues to smoke 1 pack/week. Encouraged cessation.  - Due for CT chest in 6/19 to follow lung nodules. Will look into rescheduling this for him.  3. OSA: Continue CPAP. No change. 4. PAD: Significant disease on peripheral dopplers, stable mild claudication. He saw Dr. Gwenlyn Found, medical management recommended. Encouraged him to try to walk at least a short distance through the pain.  - Continue ASA 81 and statin. Good lipids in 1/19.  Recheck today.  -  Follows with Dr. Gwenlyn Found.    5. Recent AKI - Sees a nephrologist in [facility] tomorrow.  - BMET today.  Lasix 40 mg BID for 3 more days, then back to 40 mg daily Follow up with APP 4 weeks to reassess volume. Sees Dr Aundra Dubin with echo 1/10 BMET, lipid panel, BNP today  Georgiana Shore 06/17/2018  Greater than 50% of the 25 minute visit was spent in counseling/coordination of care regarding disease state education, salt/fluid restriction, sliding scale diuretics, and medication compliance.

## 2018-06-17 ENCOUNTER — Encounter (HOSPITAL_COMMUNITY): Payer: Self-pay

## 2018-06-17 ENCOUNTER — Ambulatory Visit (HOSPITAL_COMMUNITY)
Admission: RE | Admit: 2018-06-17 | Discharge: 2018-06-17 | Disposition: A | Discharge status: Home / Self Care | Payer: Medicare Other | Source: Ambulatory Visit | Attending: Cardiology | Admitting: Cardiology

## 2018-06-17 VITALS — BP 122/70 | HR 87 | Wt 283.2 lb

## 2018-06-17 DIAGNOSIS — I5022 Chronic systolic (congestive) heart failure: Secondary | ICD-10-CM | POA: Diagnosis present

## 2018-06-17 DIAGNOSIS — J449 Chronic obstructive pulmonary disease, unspecified: Secondary | ICD-10-CM | POA: Diagnosis not present

## 2018-06-17 DIAGNOSIS — I110 Hypertensive heart disease with heart failure: Secondary | ICD-10-CM | POA: Diagnosis not present

## 2018-06-17 DIAGNOSIS — G4733 Obstructive sleep apnea (adult) (pediatric): Secondary | ICD-10-CM | POA: Diagnosis not present

## 2018-06-17 DIAGNOSIS — I5042 Chronic combined systolic (congestive) and diastolic (congestive) heart failure: Secondary | ICD-10-CM

## 2018-06-17 DIAGNOSIS — N179 Acute kidney failure, unspecified: Secondary | ICD-10-CM | POA: Diagnosis not present

## 2018-06-17 DIAGNOSIS — Z8249 Family history of ischemic heart disease and other diseases of the circulatory system: Secondary | ICD-10-CM | POA: Insufficient documentation

## 2018-06-17 DIAGNOSIS — I739 Peripheral vascular disease, unspecified: Secondary | ICD-10-CM | POA: Diagnosis not present

## 2018-06-17 DIAGNOSIS — E1151 Type 2 diabetes mellitus with diabetic peripheral angiopathy without gangrene: Secondary | ICD-10-CM | POA: Diagnosis not present

## 2018-06-17 DIAGNOSIS — I428 Other cardiomyopathies: Secondary | ICD-10-CM | POA: Diagnosis not present

## 2018-06-17 DIAGNOSIS — F17210 Nicotine dependence, cigarettes, uncomplicated: Secondary | ICD-10-CM | POA: Diagnosis not present

## 2018-06-17 DIAGNOSIS — E785 Hyperlipidemia, unspecified: Secondary | ICD-10-CM | POA: Diagnosis not present

## 2018-06-17 DIAGNOSIS — Z720 Tobacco use: Secondary | ICD-10-CM

## 2018-06-17 DIAGNOSIS — Z79899 Other long term (current) drug therapy: Secondary | ICD-10-CM | POA: Insufficient documentation

## 2018-06-17 DIAGNOSIS — Z7982 Long term (current) use of aspirin: Secondary | ICD-10-CM | POA: Insufficient documentation

## 2018-06-17 DIAGNOSIS — Z7984 Long term (current) use of oral hypoglycemic drugs: Secondary | ICD-10-CM | POA: Diagnosis not present

## 2018-06-17 DIAGNOSIS — Z9989 Dependence on other enabling machines and devices: Secondary | ICD-10-CM

## 2018-06-17 LAB — BASIC METABOLIC PANEL
ANION GAP: 9 (ref 5–15)
BUN: 23 mg/dL — ABNORMAL HIGH (ref 6–20)
CALCIUM: 8.8 mg/dL — AB (ref 8.9–10.3)
CO2: 27 mmol/L (ref 22–32)
CREATININE: 1.19 mg/dL (ref 0.61–1.24)
Chloride: 103 mmol/L (ref 98–111)
Glucose, Bld: 122 mg/dL — ABNORMAL HIGH (ref 70–99)
Potassium: 3.3 mmol/L — ABNORMAL LOW (ref 3.5–5.1)
SODIUM: 139 mmol/L (ref 135–145)

## 2018-06-17 LAB — LIPID PANEL
CHOLESTEROL: 123 mg/dL (ref 0–200)
HDL: 34 mg/dL — ABNORMAL LOW (ref >40)
LDL Cholesterol: 66 mg/dL (ref 0–99)
TRIGLYCERIDES: 117 mg/dL (ref <150)
Total CHOL/HDL Ratio: 3.6 RATIO
VLDL: 23 mg/dL (ref 0–40)

## 2018-06-17 LAB — BRAIN NATRIURETIC PEPTIDE: B NATRIURETIC PEPTIDE 5: 44.6 pg/mL (ref 0.0–100.0)

## 2018-06-17 MED ORDER — FUROSEMIDE 40 MG PO TABS: ORAL_TABLET | ORAL | 3 refills | Status: DC

## 2018-06-17 NOTE — Patient Instructions (Signed)
INCREASE Lasix to 40 mg twice a day for 3 days, then continue 40 mg one tab daily thereafter  Labs today We will only contact you if something comes back abnormal or we need to make some changes. Otherwise no news is good news!  Your physician recommends that you schedule a follow-up appointment in: 4 weeks  in the [facility] Pract[facility] (PA/NP) Clinic    Do the following things EVERYDAY: 1) Weigh yourself in the morning before breakfast. Write it down and keep it in a log. 2) Take your medicines as prescribed 3) Eat low salt foods-Limit salt (sodium) to 2000 mg per day.  4) Stay as active as you can everyday 5) Limit all fluids for the day to less than 2 liters

## 2018-06-18 ENCOUNTER — Telehealth (HOSPITAL_COMMUNITY): Payer: Self-pay | Admitting: Cardiology

## 2018-06-18 MED ORDER — POTASSIUM CHLORIDE CRYS ER 20 MEQ PO TBCR: 20.0000 meq | EXTENDED_RELEASE_TABLET | Freq: Every day | ORAL | 3 refills | Status: DC

## 2018-06-18 NOTE — Telephone Encounter (Signed)
Notes recorded by Kerry Dory, CMA on 06/18/2018 at 9:53 AM EST Patient aware. Patient voiced understanding, will have labs rechecked in one week with PCP ([facility])  ------  Notes recorded by Georgiana Shore, NP on 06/17/2018 at 1:35 PM EST Renal function stable. Potassium a little low. Have him restart taking 20 meq K daily. Recheck BMET in about 1 week in [facility]. Please ask where is best to get labs checked -driving to [facility] is difficult for him. Thanks

## 2018-06-18 NOTE — Telephone Encounter (Signed)
-----   Message from Georgiana Shore, NP sent at 06/17/2018  1:35 PM EST ----- Renal function stable. Potassium a little low. Have him restart taking 20 meq K daily. Recheck BMET in about 1 week in [facility]. Please ask where is best to get labs checked -driving to [facility] is difficult for him. Thanks

## 2018-06-19 NOTE — Progress Notes (Signed)
For now, continue taking Lasix 40 mg daily.  BMET 1 week.

## 2018-06-19 NOTE — Progress Notes (Signed)
Call to patient.  Advised Dr Aundra Dubin recommended to continue Furosemide 40 mg daily and BMET in a week.  He said he spoke with HF clinic today and since he has MD appt in [facility] next week, he will have blood drawn there and send results to Dr Oleh Genin office.  Advised that will be fine. He verbalized understanding.

## 2018-06-26 LAB — CUP PACEART REMOTE DEVICE CHECK
HighPow Impedance: 63 Ohm
Implantable Lead Implant Date: 20160725
Implantable Lead Serial Number: 383593
Lead Channel Impedance Value: 342 Ohm
Lead Channel Pacing Threshold Amplitude: 0.75 V
Lead Channel Pacing Threshold Pulse Width: 0.4 ms
Lead Channel Sensing Intrinsic Amplitude: 8.375 mV
Lead Channel Sensing Intrinsic Amplitude: 8.375 mV
Lead Channel Setting Sensing Sensitivity: 0.3 mV
MDC IDC LEAD LOCATION: 753860
MDC IDC MSMT BATTERY REMAINING LONGEVITY: 115 mo
MDC IDC MSMT BATTERY VOLTAGE: 3.02 V
MDC IDC MSMT LEADCHNL RV IMPEDANCE VALUE: 342 Ohm
MDC IDC PG IMPLANT DT: 20160725
MDC IDC SESS DTM: 20191024073323
MDC IDC SET LEADCHNL RV PACING AMPLITUDE: 2.5 V
MDC IDC SET LEADCHNL RV PACING PULSEWIDTH: 0.4 ms
MDC IDC STAT BRADY RV PERCENT PACED: 0 %

## 2018-06-29 ENCOUNTER — Telehealth (HOSPITAL_COMMUNITY): Payer: Self-pay | Admitting: Vascular Surgery

## 2018-06-29 NOTE — Telephone Encounter (Signed)
Spoke to pt giving CT APPT for 07/07/18 @ 3:30, PT CONFIMED APPT DATE AND TIME

## 2018-07-06 ENCOUNTER — Telehealth (HOSPITAL_COMMUNITY): Payer: Self-pay

## 2018-07-06 ENCOUNTER — Ambulatory Visit (INDEPENDENT_AMBULATORY_CARE_PROVIDER_SITE_OTHER): Payer: Medicare Other

## 2018-07-06 DIAGNOSIS — Z95810 Presence of automatic (implantable) cardiac defibrillator: Secondary | ICD-10-CM

## 2018-07-06 DIAGNOSIS — I5042 Chronic combined systolic (congestive) and diastolic (congestive) heart failure: Secondary | ICD-10-CM

## 2018-07-06 NOTE — Progress Notes (Signed)
EPIC Encounter for ICM Monitoring  Patient Name: David Bright is a 57 y.o. male Date: 07/06/2018 Primary Care Physican: Luetta Nutting, DO Primary Cardiologist:[facility]  Electrophysiologist:Klein Last Weight:283.2lbs    Today's Weight: 283 lbs        Heart Failure questions reviewed, pt asymptomatic.  Patient reported self adjusting Furosemide to 1 tablet twice a day x 2-3 days a week but there are some days he forgets to take any dosages.  Weight increased to 285 lbs this week by dropped back to 283 lbs.     Thoracic impedance abnormal suggesting fluid accumulation.         Prescribed: Furosemide40 mg 1 tablet daily.Potassium 20 mEq 1 tablet daily.   Labs: 06/17/2018 Creatinine 1.19, BUN 23, Potassium 3.3, Sodium 139, eGFR >60 05/26/2018 Creatinine 1.40, BUN 33, Potassium 4.3, Sodium 137, eGFR 54->60  05/08/2018 Creatinine 1.90, BUN 56, Potassium 5.0, Sodium 137, eGFR 37-43  03/27/2018 Creatinine 1.80, BUN 48, Potassium 4.6, Sodium 139, EGFR 41-47 03/17/2018 Creatinine 1.87, BUN 52, Potassium 4.7, Sodium 139, EGFR 39-45 01/02/2018 Creatinine 1.21, BUN 33, Potassium 4.6, Sodium 140, EGFR 66-76 Care Everywhere 12/23/2017 Creatinine 1.45, BUN 30, Potassium 5.8, Sodium 138, EGFR 52-60 08/19/2017 Creatinine 1.16, BUN 16, Potassium 4.2, Sodium 137, EGFR >60 A complete set of results can be found in Results Review.  Recommendations:  Advised patient not to self adjust Furosemide due to his latest BMET showed low potassium and explained the more Lasix taken the Potassium has potential to drop too low.  Advised to take Furosemide 40 mg daily as prescribed and Potassium 20 mEq 1 tablet daily.   Patient will set up BMET with his PCP office as discussed at last HF office visit on 06/17/2018.   Will send to Dr Aundra Dubin for recommendations if needed.   Follow-up plan: ICM clinic phone appointment on 07/23/2018 to recheck fluid levels.   Office appointment  scheduled 07/16/2018 with HF Clinic NP/PA.    Copy of ICM check sent to Dr. Caryl Comes and Dr Aundra Dubin for review and recommendations if needed.   3 month ICM trend: 07/06/2018    1 Year ICM trend:       [name], RN 07/06/2018 10:38 AM

## 2018-07-06 NOTE — Telephone Encounter (Signed)
Prior Authorization for Corlanor (ivabradine) through [facility] has been approved until 08/12/2019.  Richardine Service, PharmD Candidate

## 2018-07-07 ENCOUNTER — Ambulatory Visit (HOSPITAL_COMMUNITY)
Admission: RE | Admit: 2018-07-07 | Discharge: 2018-07-07 | Disposition: A | Discharge status: Home / Self Care | Payer: Medicare Other | Source: Ambulatory Visit | Attending: Cardiology | Admitting: Cardiology

## 2018-07-07 DIAGNOSIS — R918 Other nonspecific abnormal finding of lung field: Secondary | ICD-10-CM | POA: Diagnosis not present

## 2018-07-15 NOTE — Progress Notes (Signed)
PCP: [facility] HF Cardiology: Dr. Aundra Dubin  57 y.o. with history of chronic systolic CHF and COPD/active smoking presents for followup of CHF.  He has a known nonischemic cardiomyopathy, echo in 1/17 with EF 20% and a severely dilated LV.  No coronary disease on 7/16 cardiac cath.  Repeat echo was 2/18, showing EF up to 40%.   He is trying stop smoking with Chantix, down to 2-3 cigs/day. Still with leg fatigue/cramping after walking 100 yards.  He had significant PAD by peripheral arterial dopplers, saw Dr. Gwenlyn Found with plan of medical management for now. He also has knee pain which is limiting.   Echo 09/2017: EF up to 50-55% with mild LVH.   Today he returns for HF follow up. Overall feeling fine. Denies SOB/PND/Orthopnea. Appetite ok. No fever or chills. Weight at home stable. Taking all medications but has a hard time paying for medications. Says he missing medication a least once a day.  Smoking 1/2 PPD.   Labs (12/17): K 4.3, creatinine 1.09 Labs (1/18): K 3.9, creatinine 1.13 Labs (3/18): K 4.3, creatinine 1.48, NT-proBNP 61 Labs (4/18): K 4.5, creatinine 1.23, LDL 71, BNP 34 Labs (5/18): K 4.5, creatinine 1.2 Labs (1/19): K 4.2, creatinine 1.19, LDL 69, HDL 39  PMH:  1. Chronic systolic CHF: Nonischemic cardiomyopathy.  Has Medtronic ICD.   - LHC (7/16) with nonobstructive disease.  - Echo (1/17) with EF 20%, severe LV dilation, mild to moderate MR.  - Echo (2/18): EF 40%, mild LV dilation.  - Echo (5/19): EF 50-55%, mild LVH 2. COPD: Active smoker.  - PFTs (2/18) with minimal obstruction, mild restriction suggesting a parenchymal process, low DLCO.  - CT chest (6/18): No ILD, +emphysema, lung nodules.  3. Type II diabetes with peripheral neuropathy.  4. OSA on CPAP 5. HTN 6. Hyperlipidemia 7. PAD: ABIs abnormal at outside office.  - Peripheral arterial dopplers (3/18) with > 50% left CIA and bilateral EIA stenosis, 50-74% mid LSFA stenosis.  - ABIs (2/19): 0.85 right, 0.86  left (mild).   SH: Lives in [place], works at [employer] and [employer], smokes about 2-3 cigs/day now.  Rare ETOH.  Remote substance abuse.   FH: Aunt with CHF, brother with ESRD.    Review of systems complete and found to be negative unless listed in HPI.   Current Outpatient Medications  Medication Sig Dispense Refill  . aspirin EC 81 MG tablet Take 1 tablet (81 mg total) by mouth daily. 30 tablet 0  . atorvastatin (LIPITOR) 20 MG tablet Take 1 tablet (20 mg total) by mouth daily. 30 tablet 3  . canagliflozin (INVOKANA) 300 MG TABS tablet Take 300 mg by mouth daily before breakfast.    . carvedilol (COREG) 12.5 MG tablet Take 1.5 tablets (18.75 mg total) by mouth 2 (two) times daily with a meal. (Patient taking differently: Take 12.5 mg by mouth 2 (two) times daily with a meal. ) 90 tablet 3  . ENTRESTO 97-103 MG TAKE 1 TABLET BY MOUTH TWICE DAILY 60 tablet 6  . furosemide (LASIX) 40 MG tablet Take 1 tablet (40 mg total) by mouth 2 (two) times daily for 3 days, THEN 1 tablet (40 mg total) daily. 90 tablet 3  . isosorbide-hydrALAZINE (BIDIL) 20-37.5 MG tablet Take 1 tablet by mouth 2 (two) times daily.    . ivabradine (CORLANOR) 5 MG TABS tablet Take 1 tablet (5 mg total) by mouth 2 (two) times daily with a meal. 60 tablet 10  . mesalamine (LIALDA)  1.2 g EC tablet Take 1.2 g 2 (two) times daily by mouth.    . potassium chloride SA (K-DUR,KLOR-CON) 20 MEQ tablet Take 1 tablet (20 mEq total) by mouth daily. 90 tablet 3  . sitaGLIPtin-metformin (JANUMET) 50-1000 MG tablet Take 1 tablet by mouth 2 (two) times daily with a meal.    . spironolactone (ALDACTONE) 25 MG tablet Take 1 tablet (25 mg total) by mouth at bedtime. 90 tablet 2  . sildenafil (REVATIO) 20 MG tablet Take 20 mg by mouth. Pt reported 12/23/16 he takes 2-5 tablets prn for sexual activity.     No current facility-administered medications for this encounter.    BP 140/82   Pulse 70   Wt 128.3 kg (282 lb 12.8 oz)   SpO2 97%   BMI  38.35 kg/m    Wt Readings from Last 3 Encounters:  07/16/18 128.3 kg (282 lb 12.8 oz)  06/17/18 128.5 kg (283 lb 3.2 oz)  05/26/18 126.1 kg (278 lb)   General:  Well appearing. No resp difficulty HEENT: normal Neck: supple. no JVD. Carotids 2+ bilat; no bruits. No lymphadenopathy or thryomegaly appreciated. Cor: PMI nondisplaced. Regular rate & rhythm. No rubs, gallops or murmurs. Lungs: clear Abdomen: soft, nontender, nondistended. No hepatosplenomegaly. No bruits or masses. Good bowel sounds. Extremities: no cyanosis, clubbing, rash, edema Neuro: alert & orientedx3, cranial nerves grossly intact. moves all 4 extremities w/o difficulty. Affect pleasant  Assessment/Plan: 1. Chronic systolic CHF: Nonischemic cardiomyopathy => ?due to viral myocarditis or prior substance abuse.  Medtronic ICD.  Echo 1/17 with severely dilated LV and EF 20%, EF up to 40% on echo 2/18 and up to 50-55%.  - NYHA II. Volume status stable. Continue lasix 40 mg daily. - Continue Entresto 97/103 bid - Continue ivabradine 5 mg BID - Continue spironolactone 25 mg qHS - Continue Coreg 12.5 mg bid. Did not tolerate higher doses with dizziness. - Increase bidil to 3 times a day.  2. Smoking/suspect COPD:  PFTs done, somewhat concerning for interstitial lung disease rather than emphysema with restrictive PFTs and decreased DLCO => no ILD on CT, emphysema noted along with lung nodules.   - - Due for CT chest in 6/19 to follow lung nodules.  -Discussed smoking cessation.  3. OSA: Continue CPAP. No change. 4. PAD: Significant disease on peripheral dopplers, stable mild claudication. He saw Dr. Gwenlyn Found, medical management recommended. Encouraged him to try to walk at least a short distance through the pain.  - Continue ASA 81 and statin. Good lipids in 1/19.   - Follows with Dr. Gwenlyn Found.    5. Recent AKI -Followed by nephrology - Check BMET now.   Referred to HF Paramedicine due to medication errors and noncompliance.   Follow up next month with Dr Aundra Dubin.  Shaughnessy Gethers NP-C  07/16/2018

## 2018-07-16 ENCOUNTER — Other Ambulatory Visit: Payer: Self-pay

## 2018-07-16 ENCOUNTER — Ambulatory Visit (HOSPITAL_COMMUNITY)
Admission: RE | Admit: 2018-07-16 | Discharge: 2018-07-16 | Disposition: A | Discharge status: Home / Self Care | Payer: 59 | Source: Ambulatory Visit | Attending: Internal Medicine | Admitting: Internal Medicine

## 2018-07-16 ENCOUNTER — Telehealth (HOSPITAL_COMMUNITY): Payer: Self-pay | Admitting: Surgery

## 2018-07-16 VITALS — BP 140/82 | HR 70 | Wt 282.8 lb

## 2018-07-16 DIAGNOSIS — J449 Chronic obstructive pulmonary disease, unspecified: Secondary | ICD-10-CM | POA: Insufficient documentation

## 2018-07-16 DIAGNOSIS — I110 Hypertensive heart disease with heart failure: Secondary | ICD-10-CM | POA: Diagnosis not present

## 2018-07-16 DIAGNOSIS — E1151 Type 2 diabetes mellitus with diabetic peripheral angiopathy without gangrene: Secondary | ICD-10-CM | POA: Insufficient documentation

## 2018-07-16 DIAGNOSIS — E1142 Type 2 diabetes mellitus with diabetic polyneuropathy: Secondary | ICD-10-CM | POA: Insufficient documentation

## 2018-07-16 DIAGNOSIS — F17210 Nicotine dependence, cigarettes, uncomplicated: Secondary | ICD-10-CM | POA: Diagnosis not present

## 2018-07-16 DIAGNOSIS — I428 Other cardiomyopathies: Secondary | ICD-10-CM | POA: Diagnosis not present

## 2018-07-16 DIAGNOSIS — Z8249 Family history of ischemic heart disease and other diseases of the circulatory system: Secondary | ICD-10-CM | POA: Insufficient documentation

## 2018-07-16 DIAGNOSIS — Z79899 Other long term (current) drug therapy: Secondary | ICD-10-CM | POA: Insufficient documentation

## 2018-07-16 DIAGNOSIS — Z95810 Presence of automatic (implantable) cardiac defibrillator: Secondary | ICD-10-CM | POA: Diagnosis not present

## 2018-07-16 DIAGNOSIS — Z7984 Long term (current) use of oral hypoglycemic drugs: Secondary | ICD-10-CM | POA: Insufficient documentation

## 2018-07-16 DIAGNOSIS — Z7982 Long term (current) use of aspirin: Secondary | ICD-10-CM | POA: Insufficient documentation

## 2018-07-16 DIAGNOSIS — I5022 Chronic systolic (congestive) heart failure: Secondary | ICD-10-CM | POA: Diagnosis present

## 2018-07-16 DIAGNOSIS — E785 Hyperlipidemia, unspecified: Secondary | ICD-10-CM | POA: Diagnosis not present

## 2018-07-16 DIAGNOSIS — Z9114 Patient's other noncompliance with medication regimen: Secondary | ICD-10-CM | POA: Insufficient documentation

## 2018-07-16 DIAGNOSIS — G4733 Obstructive sleep apnea (adult) (pediatric): Secondary | ICD-10-CM | POA: Diagnosis not present

## 2018-07-16 DIAGNOSIS — Z720 Tobacco use: Secondary | ICD-10-CM | POA: Diagnosis not present

## 2018-07-16 DIAGNOSIS — Z841 Family history of disorders of kidney and ureter: Secondary | ICD-10-CM | POA: Diagnosis not present

## 2018-07-16 MED ORDER — CARVEDILOL 12.5 MG PO TABS: 12.5000 mg | ORAL_TABLET | Freq: Two times a day (BID) | ORAL | 6 refills | Status: DC

## 2018-07-16 MED ORDER — ISOSORB DINITRATE-HYDRALAZINE 20-37.5 MG PO TABS: 1.0000 | ORAL_TABLET | Freq: Three times a day (TID) | ORAL | 3 refills | Status: DC

## 2018-07-16 NOTE — Patient Instructions (Signed)
INCREASE BIDIL 20/37.5mg  to 1 tab three times a day  CONTINUE taking Coreg 12.5mg  twice a day  Your physician recommends that you schedule a follow-up appointment in: Keep January appointment with Dr. Aundra Dubin and follow up in 3 months with NP/PA Clinic.

## 2018-07-16 NOTE — Telephone Encounter (Signed)
Patient referred to HF [facility].  Referral paperwork sent via secure email to the paramedic team.

## 2018-07-21 ENCOUNTER — Telehealth (HOSPITAL_COMMUNITY): Payer: Self-pay

## 2018-07-21 NOTE — Telephone Encounter (Signed)
Spoke with patient this morning. Reports he now has [place] and was able to receive his medication.

## 2018-07-21 NOTE — Telephone Encounter (Signed)
-----   Message from Adora Fridge, [place] sent at 07/20/2018  3:11 PM EST ----- Regarding: RE: Blair Dolphin,   So his pharmacy said he no longer has [facility] and that it is going through insurance but it's just a refill too soon until the 30th so he should be able to get it after this time.   ----- Message ----- From: Valeda Malm, RN Sent: 07/16/2018  11:30 AM EST To: Adora Fridge, RPH-CPP Subject: Delene Loll                                       Pt states that [employer] needs authorization for his Entresto.  Can you please take care of this? Let me know if you need anything.  Thanks

## 2018-07-22 ENCOUNTER — Telehealth (HOSPITAL_COMMUNITY): Payer: Self-pay

## 2018-07-22 NOTE — Telephone Encounter (Signed)
I called David Bright to schedule an appointment. He stated he would be out all day today but would be able to meet with me tomorrow morning. We agreed to meet at 11:00.

## 2018-07-23 ENCOUNTER — Ambulatory Visit (INDEPENDENT_AMBULATORY_CARE_PROVIDER_SITE_OTHER): Payer: 59

## 2018-07-23 ENCOUNTER — Telehealth (HOSPITAL_COMMUNITY): Payer: Self-pay | Admitting: Licensed Clinical Social Worker

## 2018-07-23 ENCOUNTER — Other Ambulatory Visit (HOSPITAL_COMMUNITY): Payer: Self-pay

## 2018-07-23 ENCOUNTER — Encounter (HOSPITAL_COMMUNITY): Payer: Self-pay | Admitting: Licensed Clinical Social Worker

## 2018-07-23 DIAGNOSIS — I5022 Chronic systolic (congestive) heart failure: Secondary | ICD-10-CM

## 2018-07-23 DIAGNOSIS — Z95810 Presence of automatic (implantable) cardiac defibrillator: Secondary | ICD-10-CM

## 2018-07-23 NOTE — Telephone Encounter (Signed)
Paramedicine Initial Assessment:  Housing:  In what kind of housing do you live? House/apt/trailer/shelter? apt  Do you rent/pay a mortgage/own? rent  Do you live with anyone? no  Are you currently worried about losing your housing? No- but interested in moving to a different apartment.  Within the past 12 months have you ever stayed outside, in a car, tent, a shelter, or temporarily with someone? No  Within the past 12 months have you been unable to get utilities when it was really needed? No  Social:  What is your current marital status? Single  Do you have family or friends who live locally? 2 sisters and a brother.  Food:  Within the past 12 months were you ever worried that food would run out before you got money to buy more? No  Within the past 48months have you run out of food and didn't have money to buy more? No  Income:  What is your current source of income? 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  How hard is it for you to pay for the basics like food housing, medical care, and utilities? Not very hard  Insurance:  Are you currently insured?  Yes, reports Medicare/Medicaid  Do you have prescription coverage? Yes- just signed up for part D   Transportation:  Do you have transportation to your medical appointments? yes   If yes, how? Drives himself  In the past 12 months has lack of transportation kept you from medical appts or from getting medications? No  In the past 12 months has lack of transportation kept you from meetings, work, or getting things you needed? No   Daily Health Needs: Do you have a working scale at home? Yes  How do you manage your medications at home? Created his own system where he separates pill bottles by frequency he takes them.  Do you ever take your medications differently than prescribed? Sometimes forgets pills and had adjusted his own medications without consulting MD first when hes had  concerns.  Do you have issues affording your medications? No  Do you have any concerns with mobility at home? No  Do you use any assistive devices at home or have PCS at home? No  Are there any additional barriers you see to getting the care you need? Not at this time.  CSW will continue to follow through paramedicine program and assist as needed.  Jorge Ny, LCSW Clinical Social Worker [facility] [phone]

## 2018-07-23 NOTE — Progress Notes (Signed)
EPIC Encounter for ICM Monitoring  Patient Name: David Bright is a 57 y.o. male Date: 07/23/2018 Primary Care Physican: Luetta Nutting, DO Primary Cardiologist:[facility]  Electrophysiologist:Klein Last Weight:283.2lbs Today's Weight: 278 lbs                                                            Heart Failure questions reviewed, pt asymptomatic.   Patient has not been taking Furosemide as prescribed but getting on track with the help of paramedicine program.    Thoracic impedance abnormal suggesting fluid accumulation since October.         Prescribed: Furosemide40 mg 1 tablet daily.Potassium 20 mEq 1 tablet daily.   Labs: 06/17/2018 Creatinine 1.19, BUN 23, Potassium 3.3, Sodium 139, eGFR >60 05/26/2018 Creatinine 1.40, BUN 33, Potassium 4.3, Sodium 137, eGFR 54->60  05/08/2018 Creatinine 1.90, BUN 56, Potassium 5.0, Sodium 137, eGFR 37-43  03/27/2018 Creatinine 1.80, BUN 48, Potassium 4.6, Sodium 139, EGFR 41-47 03/17/2018 Creatinine 1.87, BUN 52, Potassium 4.7, Sodium 139, EGFR 39-45 01/02/2018 Creatinine 1.21, BUN 33, Potassium 4.6, Sodium 140, EGFR 66-76 Care Everywhere 12/23/2017 Creatinine 1.45, BUN 30, Potassium 5.8, Sodium 138, EGFR 52-60 08/19/2017 Creatinine 1.16, BUN 16, Potassium 4.2, Sodium 137, EGFR >60 A complete set of results can be found in Results Review.  Recommendations:  Advised will call back with any recommendations if needed. Encouraged to call for fluid symptoms.  Follow-up plan: ICM clinic phone appointment on 07/30/2018 to recheck fluid levels.   Office appointment scheduled 08/21/2018 with Dr. Aundra Dubin.    Copy of ICM check sent to Dr. Caryl Comes and Dr Aundra Dubin for review and will call back if any recommendations.    3 month ICM trend: 07/23/2018    1 Year ICM trend:       [name], RN 07/23/2018 11:10 AM

## 2018-07-23 NOTE — Progress Notes (Signed)
Paramedicine Encounter    Patient ID: David Bright, male    DOB: 1960/11/20, 57 y.o.   MRN: [id number]   Patient Care Team: Luetta Nutting, DO as PCP - General (Family Medicine) Dorothy Spark, MD as PCP - Cardiology (Cardiology) Verl Blalock, Marijo Conception, MD (Inactive) (Cardiology) Sharmon Revere as Physician Assistant (Physician Assistant)  Patient Active Problem List   Diagnosis Date Noted  . Peripheral arterial disease ([place]) 10/29/2016  . BMI 36.0-36.9,adult 09/25/2016  . Vitamin D deficiency 09/18/2016  . Polysubstance abuse ([place]) 07/18/2016  . AKI (acute kidney injury) ([place]) 05/13/2016  . Bilateral ocular hypertension 01/08/2016  . Nuclear sclerotic cataract of both eyes 01/08/2016  . Posterior subcapsular polar age-related cataract of left eye 01/08/2016  . Presbyopia 01/08/2016  . Regular astigmatism of both eyes 01/08/2016  . Nicotine dependence 09/05/2015  . Upper respiratory infection, viral 09/05/2015  . Acute on chronic systolic and diastolic heart failure, NYHA class 3 ([place]) 08/09/2015  . S/P ICD (internal cardiac defibrillator) procedure 08/09/2015  . Acute on chronic combined systolic and diastolic congestive heart failure ([place]) 08/09/2015  . History of biliary T-tube placement 08/09/2015  . Hypertension 07/04/2015  . Obstructive sleep apnea syndrome 06/04/2015  . Type 2 diabetes mellitus ([place]) 05/16/2015  . Type 2 diabetes mellitus without complication ([place]) 67/34/1937  . Palpitations 01/26/2015  . SOB (shortness of breath) 10/20/2014  . DOE (dyspnea on exertion) 10/20/2014  . Abnormal EKG 10/20/2014  . Dyspnea on exertion 10/20/2014  . Chronic combined systolic and diastolic CHF (congestive heart failure) ([place]) 10/19/2013  . NICM (nonischemic cardiomyopathy) ([place]) 11/09/2011  . OSA on CPAP 11/09/2011  . HTN (hypertension) 11/09/2011  . Hyperlipidemia 11/09/2011  . Chronic obstructive pulmonary disease ([place]) 11/09/2011  . DM (diabetes mellitus) ([place])  11/09/2011  . Acute respiratory failure ([place]) 11/07/2011    Current Outpatient Medications:  .  atorvastatin (LIPITOR) 20 MG tablet, Take 1 tablet (20 mg total) by mouth daily., Disp: 30 tablet, Rfl: 3 .  canagliflozin (INVOKANA) 300 MG TABS tablet, Take 300 mg by mouth daily before breakfast., Disp: , Rfl:  .  carvedilol (COREG) 12.5 MG tablet, Take 1 tablet (12.5 mg total) by mouth 2 (two) times daily with a meal., Disp: 60 tablet, Rfl: 6 .  ENTRESTO 97-103 MG, TAKE 1 TABLET BY MOUTH TWICE DAILY, Disp: 60 tablet, Rfl: 6 .  furosemide (LASIX) 40 MG tablet, Take 1 tablet (40 mg total) by mouth 2 (two) times daily for 3 days, THEN 1 tablet (40 mg total) daily., Disp: 90 tablet, Rfl: 3 .  isosorbide-hydrALAZINE (BIDIL) 20-37.5 MG tablet, Take 1 tablet by mouth 3 (three) times daily., Disp: 90 tablet, Rfl: 3 .  ivabradine (CORLANOR) 5 MG TABS tablet, Take 1 tablet (5 mg total) by mouth 2 (two) times daily with a meal., Disp: 60 tablet, Rfl: 10 .  potassium chloride SA (K-DUR,KLOR-CON) 20 MEQ tablet, Take 1 tablet (20 mEq total) by mouth daily., Disp: 90 tablet, Rfl: 3 .  sitaGLIPtin-metformin (JANUMET) 50-1000 MG tablet, Take 1 tablet by mouth 2 (two) times daily with a meal., Disp: , Rfl:  .  spironolactone (ALDACTONE) 25 MG tablet, Take 1 tablet (25 mg total) by mouth at bedtime., Disp: 90 tablet, Rfl: 2 .  aspirin EC 81 MG tablet, Take 1 tablet (81 mg total) by mouth daily. (Patient not taking: Reported on 07/23/2018), Disp: 30 tablet, Rfl: 0 .  mesalamine (LIALDA) 1.2 g EC tablet, Take 1.2 g 2 (two) times daily by  mouth., Disp: , Rfl:  .  sildenafil (REVATIO) 20 MG tablet, Take 20 mg by mouth. Pt reported 12/23/16 he takes 2-5 tablets prn for sexual activity., Disp: , Rfl:  No Known Allergies    Social History   Socioeconomic History  . Marital status: Single    Spouse name: Not on file  . Number of children: Not on file  . Years of education: Not on file  . Highest education level: Not  on file  Occupational History  . Occupation: Geophysicist/field seismologist for [employer]    Employer: [employer]  . Financial resource strain: Not on file  . Food insecurity:    Worry: Not on file    Inability: Not on file  . Transportation needs:    Medical: Not on file    Non-medical: Not on file  Tobacco Use  . Smoking status: Current Every Day Smoker    Packs/day: 1.00    Years: 33.00    Pack years: 33.00    Types: Cigarettes  . Smokeless tobacco: Never Used  Substance and Sexual Activity  . Alcohol use: No    Comment: QUIT IN 2000  . Drug use: No    Comment: quit 12 years ago  . Sexual activity: Never  Lifestyle  . Physical activity:    Days per week: Not on file    Minutes per session: Not on file  . Stress: Not on file  Relationships  . Social connections:    Talks on phone: Not on file    Gets together: Not on file    Attends religious service: Not on file    Active member of club or organization: Not on file    Attends meetings of clubs or organizations: Not on file    Relationship status: Not on file  . Intimate partner violence:    Fear of current or ex partner: Not on file    Emotionally abused: Not on file    Physically abused: Not on file    Forced sexual activity: Not on file  Other Topics Concern  . Not on file  Social History Narrative   Lives in [place], [place] with housemate.     Physical Exam Neck:     Musculoskeletal: Normal range of motion and neck supple.  Cardiovascular:     Rate and Rhythm: Normal rate and regular rhythm.  Pulmonary:     Effort: Pulmonary effort is normal.     Breath sounds: Normal breath sounds.  Abdominal:     General: There is no distension.  Musculoskeletal: Normal range of motion.     Right lower leg: Edema present.     Left lower leg: Edema present.  Skin:    General: Skin is warm and dry.  Neurological:     Mental Status: He is oriented to person, place, and time.         Future Appointments   Date Time Provider [place]  07/23/2018  3:10 PM Short, Laurie Panda, RN [facility]  07/30/2018  7:55 AM [facility] DEVICE REMOTES [facility]  08/21/2018  1:00 PM [place] ECHO 1-BUZZ [facility]  08/21/2018  2:00 PM Larey Dresser, MD [facility] None  09/03/2018  8:25 AM [facility] DEVICE REMOTES [facility]  10/15/2018 10:30 AM [facility] PA/NP [facility] None    BP 140/84 (BP Location: Left Arm, Patient Position: Sitting, Cuff Size: Large)   Pulse 64   Resp 16   Wt 278 lb (126.1 kg)  SpO2 98%   BMI 37.70 kg/m   Weight yesterday- did not weigh Last visit weight- n/a  Mr Heo was seen at home today for our initial visit and reported feeling well. He reported being compliant with his medications but did not have any aspirin. He is not currently using a pillbox and I did not have one to provide to him. I will bring one next visit. He denied chest pain, SOB, headache, dizziness or orthopnea. He is currently able to afford all his medications and does not have housing or nutritional concerns. I will follow up in one week and provide a pillbox for him.   Jacquiline Doe, EMT 07/23/18  ACTION: Home visit completed Next visit planned for 1 week

## 2018-07-26 NOTE — Progress Notes (Signed)
Increase Lasix to 40 mg bid and KCl to 20 bid.  BMET 1 week.  Needs office appt.

## 2018-07-27 MED ORDER — POTASSIUM CHLORIDE CRYS ER 20 MEQ PO TBCR: 20.0000 meq | EXTENDED_RELEASE_TABLET | Freq: Two times a day (BID) | ORAL | 3 refills | Status: DC

## 2018-07-27 MED ORDER — FUROSEMIDE 40 MG PO TABS: ORAL_TABLET | ORAL | 3 refills | Status: DC

## 2018-07-27 NOTE — Progress Notes (Addendum)
Spoke with patient.  Advised Dr Aundra Dubin recommended to increase Lasix to 40 mg bid and KCl to 20 bid. Patient agreed to [facility] in 1 week and is scheduled for 08/03/2018.  He recommended office appt to be scheduled.  Patient has 08/21/2018 office appointment and will clarify if he wants to see patient earlier than scheduled appt and will call him back with that information. Patient has enough Potassium and Furosemide to increase the dosages.  Advised he will need to call when ready for refill.

## 2018-07-27 NOTE — Addendum Note (Signed)
Addended by: [name] on: 07/27/2018 09:55 AM   Modules accepted: Orders

## 2018-07-28 ENCOUNTER — Telehealth (HOSPITAL_COMMUNITY): Payer: Self-pay | Admitting: *Deleted

## 2018-07-28 NOTE — Telephone Encounter (Signed)
Left VM for pt to be worked in Friday 12/20 at 2pm.

## 2018-07-28 NOTE — Telephone Encounter (Signed)
-----   Message from [name], RN sent at 07/27/2018  9:40 AM EST ----- Regarding: Appt Alvin Critchley,  Per Dr Oleh Genin response to my ICM note that he sent today, he wants this patient to schedule an appointment with him.  Patient has 08/21/2018 appt with echo, can you ask Dr Aundra Dubin if he needs an earlier appt than 1/10?   Thanks!!!  Margarita Grizzle

## 2018-07-30 ENCOUNTER — Ambulatory Visit (HOSPITAL_COMMUNITY)
Admission: RE | Admit: 2018-07-30 | Discharge: 2018-07-30 | Disposition: A | Discharge status: Home / Self Care | Payer: 59 | Source: Ambulatory Visit | Attending: Cardiology | Admitting: Cardiology

## 2018-07-30 ENCOUNTER — Telehealth (HOSPITAL_COMMUNITY): Payer: Self-pay

## 2018-07-30 ENCOUNTER — Ambulatory Visit (INDEPENDENT_AMBULATORY_CARE_PROVIDER_SITE_OTHER): Payer: 59

## 2018-07-30 VITALS — BP 92/49 | HR 78 | Wt 280.8 lb

## 2018-07-30 DIAGNOSIS — E119 Type 2 diabetes mellitus without complications: Secondary | ICD-10-CM | POA: Insufficient documentation

## 2018-07-30 DIAGNOSIS — I428 Other cardiomyopathies: Secondary | ICD-10-CM

## 2018-07-30 DIAGNOSIS — I739 Peripheral vascular disease, unspecified: Secondary | ICD-10-CM | POA: Insufficient documentation

## 2018-07-30 DIAGNOSIS — I5042 Chronic combined systolic (congestive) and diastolic (congestive) heart failure: Secondary | ICD-10-CM | POA: Diagnosis present

## 2018-07-30 DIAGNOSIS — I110 Hypertensive heart disease with heart failure: Secondary | ICD-10-CM | POA: Diagnosis not present

## 2018-07-30 DIAGNOSIS — I5022 Chronic systolic (congestive) heart failure: Secondary | ICD-10-CM

## 2018-07-30 DIAGNOSIS — J449 Chronic obstructive pulmonary disease, unspecified: Secondary | ICD-10-CM | POA: Insufficient documentation

## 2018-07-30 DIAGNOSIS — Z95810 Presence of automatic (implantable) cardiac defibrillator: Secondary | ICD-10-CM

## 2018-07-30 LAB — BASIC METABOLIC PANEL
Anion gap: 10 (ref 5–15)
BUN: 30 mg/dL — AB (ref 6–20)
CHLORIDE: 105 mmol/L (ref 98–111)
CO2: 23 mmol/L (ref 22–32)
CREATININE: 1.46 mg/dL — AB (ref 0.61–1.24)
Calcium: 9 mg/dL (ref 8.9–10.3)
GFR calc Af Amer: >60 mL/min (ref >60)
GFR calc non Af Amer: 52 mL/min — ABNORMAL LOW (ref >60)
GLUCOSE: 110 mg/dL — AB (ref 70–99)
Potassium: 4 mmol/L (ref 3.5–5.1)
Sodium: 138 mmol/L (ref 135–145)

## 2018-07-30 LAB — BRAIN NATRIURETIC PEPTIDE: B Natriuretic Peptide: 16.1 pg/mL (ref 0.0–100.0)

## 2018-07-30 MED ORDER — FUROSEMIDE 20 MG PO TABS: 60.0000 mg | ORAL_TABLET | Freq: Two times a day (BID) | ORAL | 3 refills | Status: DC

## 2018-07-30 NOTE — Progress Notes (Signed)
EPIC Encounter for ICM Monitoring  Patient Name: David Bright is a 57 y.o. male Date: 07/30/2018 Primary Care Physican: Luetta Nutting, DO Primary Cardiologist:[facility]  Electrophysiologist:Klein LastWeight:278lbs Today's Weight: unknown       No call to patient.  He is being seen by Dr Aundra Dubin in the office today.   Thoracic impedance abnormal but improving and trending toward baseline.           Prescribed: Furosemide40 mg 1 tablet twice a day.Potassium 20 mEq 1 tablet twice a day.   Labs: 06/17/2018 Creatinine1.19, Evlyn Kanner, Potassium3.3, Sodium139, eGFR>60 05/26/2018 Creatinine1.40, BUN33, Potassium4.3, Sodium137, eGFR54->60  05/08/2018 Creatinine1.90, BUN56, Potassium5.0, [id number], [id number] 03/27/2018 Creatinine 1.80, BUN 48, Potassium 4.6, Sodium 139, EGFR 41-47 03/17/2018 Creatinine 1.87, BUN 52, Potassium 4.7, Sodium 139, EGFR 39-45 01/02/2018 Creatinine 1.21, BUN 33, Potassium 4.6, Sodium 140, EGFR 66-76 Care Everywhere 12/23/2017 Creatinine 1.45, BUN 30, Potassium 5.8, Sodium 138, EGFR 52-60 08/19/2017 Creatinine 1.16, BUN 16, Potassium 4.2, Sodium 137, EGFR >60 A complete set of results can be found in Results Review.  Recommendations:  None  Follow-up plan: ICM clinic phone appointment on 08/13/2018 to recheck fluid levels.   Office appointment scheduled today at 2:00 PM with Dr. Aundra Dubin.    Copy of ICM check sent to Dr. Caryl Comes.   3 month ICM trend: 07/30/2018    1 Year ICM trend:       [name], RN 07/30/2018 12:42 PM

## 2018-07-30 NOTE — Patient Instructions (Addendum)
INCREASE Lasix to 60mg  twice a day  Labs today We will only contact you if something comes back abnormal or we need to make some changes. Otherwise no news is good news!  Repeat labs in 10 days  A referral has been placed for Peripheral arterial dopplers. They will call you to schedule your appointment.  Please make a follow up appointment with Dr. Gwenlyn Found for your peripheral artery disease.   Your physician recommends that you schedule a follow-up appointment in: January 10th, 2020. Please keep your appointment for the ECHO and Dr. Aundra Dubin

## 2018-07-30 NOTE — Telephone Encounter (Signed)
I called Mr Hreha to schedule a visit for today or tomorrow. He stated he was in [place] today for an appointment at the HF clinic. He stated he was not in need of anything this week and suggested we meet the week following Christmas. I will reach out then to schedule and appointment.

## 2018-07-31 ENCOUNTER — Encounter (HOSPITAL_COMMUNITY): Payer: 59 | Admitting: Cardiology

## 2018-08-03 ENCOUNTER — Other Ambulatory Visit (HOSPITAL_COMMUNITY): Payer: 59

## 2018-08-03 NOTE — Progress Notes (Signed)
PCP: [facility] Cardiology: Dr. Aundra Dubin  57 y.o. with history of chronic systolic CHF and COPD/active smoking presents for followup of CHF.  He has a known nonischemic cardiomyopathy, echo in 1/17 with EF 20% and a severely dilated LV.  No coronary disease on 7/16 cardiac cath.  Repeat echo was 2/18, showing EF up to 40%.   He is trying stop smoking with Chantix, down to 2-3 cigs/day. Still with leg fatigue/cramping after walking 100 yards.  He had significant PAD by peripheral arterial dopplers, saw Dr. Gwenlyn Found with plan of medical management for now. He also has knee pain which is limiting.   Echo 09/2017: EF up to 50-55% with mild LVH.   He returns today for HF followup. For several weeks, he has been more short of breath with exertion.  Dyspneic after walking about 100 yards. Dyspnea walking up stairs chronically. No orthopnea/PND.  No chest pain.  Still smoking 4-5 cigs/day.  No lightheadedness.  Weight down 2 lbs. He still has claudication bilaterally, no rest pain or ulcerations.   Medtronic device interrogated: Fluid index > threshold but improving.  Impedance low but heading up.   REDS clip measurement: 36%  ECG (personally reviewed): NSR, poor RWP  Labs (12/17): K 4.3, creatinine 1.09 Labs (1/18): K 3.9, creatinine 1.13 Labs (3/18): K 4.3, creatinine 1.48, NT-proBNP 61 Labs (4/18): K 4.5, creatinine 1.23, LDL 71, BNP 34 Labs (5/18): K 4.5, creatinine 1.2 Labs (1/19): K 4.2, creatinine 1.19, LDL 69, HDL 39 Labs (11/19): LDL 66, HDL 34, K 3.3, creatinine 1.19, BNP 45  PMH:  1. Chronic systolic CHF: Nonischemic cardiomyopathy.  Has Medtronic ICD.   - LHC (7/16) with nonobstructive disease.  - Echo (1/17) with EF 20%, severe LV dilation, mild to moderate MR.  - Echo (2/18): EF 40%, mild LV dilation.  - Echo (5/19): EF 50-55%, mild LVH 2. COPD: Active smoker.  - PFTs (2/18) with minimal obstruction, mild restriction suggesting a parenchymal process, low DLCO.  - CT chest  (6/18): No ILD, +emphysema, lung nodules.  - CT chest (11/19): Emphysema, no ILD, stable nodules 3. Type II diabetes with peripheral neuropathy.  4. OSA on CPAP 5. HTN 6. Hyperlipidemia 7. PAD: ABIs abnormal at outside office.  - Peripheral arterial dopplers (3/18) with > 50% left CIA and bilateral EIA stenosis, 50-74% mid LSFA stenosis.  - ABIs (2/19): 0.85 right, 0.86 left (mild).   SH: Lives in [place], works at [employer], smokes about 4-5 cigs/day now.  Rare ETOH.  Remote substance abuse.   FH: Aunt with CHF, brother with ESRD.    Review of systems complete and found to be negative unless listed in HPI.   Current Outpatient Medications  Medication Sig Dispense Refill  . aspirin EC 81 MG tablet Take 1 tablet (81 mg total) by mouth daily. 30 tablet 0  . atorvastatin (LIPITOR) 20 MG tablet Take 1 tablet (20 mg total) by mouth daily. 30 tablet 3  . canagliflozin (INVOKANA) 300 MG TABS tablet Take 300 mg by mouth daily before breakfast.    . carvedilol (COREG) 12.5 MG tablet Take 1 tablet (12.5 mg total) by mouth 2 (two) times daily with a meal. 60 tablet 6  . ENTRESTO 97-103 MG TAKE 1 TABLET BY MOUTH TWICE DAILY 60 tablet 6  . isosorbide-hydrALAZINE (BIDIL) 20-37.5 MG tablet Take 1 tablet by mouth 3 (three) times daily. 90 tablet 3  . ivabradine (CORLANOR) 5 MG TABS tablet Take 1 tablet (5 mg total)  by mouth 2 (two) times daily with a meal. 60 tablet 10  . mesalamine (LIALDA) 1.2 g EC tablet Take 1.2 g 2 (two) times daily by mouth.    . potassium chloride SA (K-DUR,KLOR-CON) 20 MEQ tablet Take 1 tablet (20 mEq total) by mouth 2 (two) times daily. 180 tablet 3  . sitaGLIPtin-metformin (JANUMET) 50-1000 MG tablet Take 1 tablet by mouth 2 (two) times daily with a meal.    . spironolactone (ALDACTONE) 25 MG tablet Take 1 tablet (25 mg total) by mouth at bedtime. 90 tablet 2  . furosemide (LASIX) 20 MG tablet Take 3 tablets (60 mg total) by mouth 2 (two) times daily. 180 tablet 3    No current facility-administered medications for this encounter.    BP (!) 92/49   Pulse 78   Wt 127.4 kg (280 lb 12.8 oz)   SpO2 100%   BMI 38.08 kg/m  General: NAD Neck: JVP 8-9 cm, no thyromegaly or thyroid nodule.  Lungs: Clear to auscultation bilaterally with normal respiratory effort. CV: Nondisplaced PMI.  Heart regular S1/S2, no S3/S4, no murmur.  No peripheral edema.  No carotid bruit.  Unable to palpate pedal pulses.  Abdomen: Soft, nontender, no hepatosplenomegaly, no distention.  Skin: Intact without lesions or rashes.  Neurologic: Alert and oriented x 3.  Psych: Normal affect. Extremities: No clubbing or cyanosis.  HEENT: Normal.   Assessment/Plan: 1. Chronic systolic CHF: Nonischemic cardiomyopathy => ?due to viral myocarditis or prior substance abuse.  Medtronic ICD.  Echo 1/17 with severely dilated LV and EF 20%, EF up to 40% on echo 2/18 and up to 50-55%.  NYHA class III symptoms, worse the last few weeks.  Mildly volume overloaded by exam and REDS clip, still volume overloaded by Optivol but improving.  - Increase Lasix to 60 mg bid, BMET today and again in 10 days.   - Continue Entresto 97/103 bid - Continue ivabradine 5 mg BID - Continue spironolactone 25 mg daily. - Continue Coreg 12.5 mg bid.  - Continue Bidil 1 tab tid. - Arrange for repeat echo.  2. Smoking/suspect COPD:  PFTs done, somewhat concerning for interstitial lung disease rather than emphysema with restrictive PFTs and decreased DLCO => no ILD on CT, emphysema noted along with lung nodules. Most recent repeat CT in 11/19 showed stable nodules, do not need repeat. He continues to smoke 4-5 cigarettes/day.  - Wants to try Chantix again, I will prescribe.  3. OSA: Continue CPAP. No change.  4. PAD: Significant disease on peripheral dopplers, stable mild claudication. He saw Dr. Gwenlyn Found, medical management recommended.  I encouraged him to try to walk at least a short distance through the pain.  -  Continue ASA 81 and statin. Good lipids in 1/19.   - Repeat ABIs due and will need followup with Dr. Gwenlyn Found.   Loralie Champagne 08/03/2018

## 2018-08-10 ENCOUNTER — Ambulatory Visit (HOSPITAL_COMMUNITY)
Admission: RE | Admit: 2018-08-10 | Discharge: 2018-08-10 | Disposition: A | Discharge status: Home / Self Care | Payer: 59 | Source: Ambulatory Visit | Attending: Cardiology | Admitting: Cardiology

## 2018-08-10 DIAGNOSIS — I5042 Chronic combined systolic (congestive) and diastolic (congestive) heart failure: Secondary | ICD-10-CM | POA: Insufficient documentation

## 2018-08-10 LAB — BASIC METABOLIC PANEL
Anion gap: 13 (ref 5–15)
BUN: 31 mg/dL — ABNORMAL HIGH (ref 6–20)
CALCIUM: 9 mg/dL (ref 8.9–10.3)
CO2: 25 mmol/L (ref 22–32)
CREATININE: 1.59 mg/dL — AB (ref 0.61–1.24)
Chloride: 101 mmol/L (ref 98–111)
GFR calc Af Amer: 55 mL/min — ABNORMAL LOW (ref >60)
GFR, EST NON AFRICAN AMERICAN: 47 mL/min — AB (ref >60)
GLUCOSE: 240 mg/dL — AB (ref 70–99)
Potassium: 4.1 mmol/L (ref 3.5–5.1)
Sodium: 139 mmol/L (ref 135–145)

## 2018-08-13 ENCOUNTER — Ambulatory Visit (INDEPENDENT_AMBULATORY_CARE_PROVIDER_SITE_OTHER): Payer: 59

## 2018-08-13 DIAGNOSIS — Z95810 Presence of automatic (implantable) cardiac defibrillator: Secondary | ICD-10-CM

## 2018-08-13 DIAGNOSIS — I5042 Chronic combined systolic (congestive) and diastolic (congestive) heart failure: Secondary | ICD-10-CM

## 2018-08-14 NOTE — Progress Notes (Signed)
EPIC Encounter for ICM Monitoring  Patient Name: David Bright is a 58 y.o. male Date: 08/14/2018 Primary Care Physican: Luetta Nutting, DO Primary Cardiologist:[facility]  Electrophysiologist:Klein LastWeight:278lbs Today's Weight: unknown      Transmission reviewed   Thoracic impedance returned to normal after Lasix increased to 60 mg twice at day at office visit with Dr Aundra Dubin 07/30/2018        Prescribed: Furosemide40 mg 1.5 tablets twice a day (increased at 07/30/18 office visit).Potassium 20 mEq 1 tablet twice a day.   Labs: 08/10/2018 Creatinine 1.59, BUN 31, Potasium 4.1, Sodium 139, eGFR 47-55 07/30/2018 Creatinine 1.46, BUN 30, Potassium 4.0, Sodium 138, eGFR 52->60 06/17/2018 Creatinine1.19, BUN23, Potassium3.3, Sodium139, eGFR>60 05/26/2018 Creatinine1.40, BUN33, Potassium4.3, Sodium137, eGFR54->60  05/08/2018 Creatinine1.90, BUN56, Potassium5.0, [id number], [id number] 03/27/2018 Creatinine 1.80, BUN 48, Potassium 4.6, Sodium 139, EGFR 41-47 03/17/2018 Creatinine 1.87, BUN 52, Potassium 4.7, Sodium 139, EGFR 39-45 01/02/2018 Creatinine 1.21, BUN 33, Potassium 4.6, Sodium 140, EGFR 66-76 Care Everywhere 12/23/2017 Creatinine 1.45, BUN 30, Potassium 5.8, Sodium 138, EGFR 52-60 08/19/2017 Creatinine 1.16, BUN 16, Potassium 4.2, Sodium 137, EGFR >60 A complete set of results can be found in Results Review.  Recommendations: None  Follow-up plan: ICM clinic phone appointment on 09/10/2018.   Office appointment scheduled 08/21/2018 with Dr. Aundra Dubin.    Copy of ICM check sent to Dr. Caryl Comes.  Also sent to Dr Aundra Dubin to show effectiveness of increased Lasix.    3 month ICM trend: 08/13/2018    1 Year ICM trend:       [name], RN 08/14/2018 9:23 AM

## 2018-08-17 ENCOUNTER — Other Ambulatory Visit: Payer: Self-pay | Admitting: Internal Medicine

## 2018-08-19 ENCOUNTER — Other Ambulatory Visit (HOSPITAL_COMMUNITY): Payer: Self-pay

## 2018-08-19 NOTE — Progress Notes (Signed)
Paramedicine Encounter    Patient ID: David Bright, male    DOB: [birth year], 59 y.o.   MRN: [id number]   Patient Care Team: Luetta Nutting, DO as PCP - General (Family Medicine) Dorothy Spark, MD as PCP - Cardiology (Cardiology) Verl Blalock, Marijo Conception, MD (Inactive) (Cardiology) Sharmon Revere as Physician Assistant (Physician Assistant) Jorge Ny, LCSW as Social Worker (Licensed Clinical Social Worker)  Patient Active Problem List   Diagnosis Date Noted  . Peripheral arterial disease ([place]) 10/29/2016  . BMI 36.0-36.9,adult 09/25/2016  . Vitamin D deficiency 09/18/2016  . Polysubstance abuse ([place]) 07/18/2016  . AKI (acute kidney injury) ([place]) 05/13/2016  . Bilateral ocular hypertension 01/08/2016  . Nuclear sclerotic cataract of both eyes 01/08/2016  . Posterior subcapsular polar age-related cataract of left eye 01/08/2016  . Presbyopia 01/08/2016  . Regular astigmatism of both eyes 01/08/2016  . Nicotine dependence 09/05/2015  . Upper respiratory infection, viral 09/05/2015  . Acute on chronic systolic and diastolic heart failure, NYHA class 3 ([place]) 08/09/2015  . S/P ICD (internal cardiac defibrillator) procedure 08/09/2015  . Acute on chronic combined systolic and diastolic congestive heart failure ([place]) 08/09/2015  . History of biliary T-tube placement 08/09/2015  . Hypertension 07/04/2015  . Obstructive sleep apnea syndrome 06/04/2015  . Type 2 diabetes mellitus ([place]) 05/16/2015  . Type 2 diabetes mellitus without complication ([place]) 08/65/7846  . Palpitations 01/26/2015  . SOB (shortness of breath) 10/20/2014  . Bright (dyspnea on exertion) 10/20/2014  . Abnormal EKG 10/20/2014  . Dyspnea on exertion 10/20/2014  . Chronic combined systolic and diastolic CHF (congestive heart failure) ([place]) 10/19/2013  . NICM (nonischemic cardiomyopathy) ([place]) 11/09/2011  . OSA on CPAP 11/09/2011  . HTN (hypertension) 11/09/2011  . Hyperlipidemia 11/09/2011  . Chronic obstructive  pulmonary disease ([place]) 11/09/2011  . DM (diabetes mellitus) ([place]) 11/09/2011  . Acute respiratory failure ([employer]) 11/07/2011    Current Outpatient Medications:  .  aspirin EC 81 MG tablet, Take 1 tablet (81 mg total) by mouth daily., Disp: 30 tablet, Rfl: 0 .  atorvastatin (LIPITOR) 20 MG tablet, Take 1 tablet (20 mg total) by mouth daily., Disp: 30 tablet, Rfl: 3 .  canagliflozin (INVOKANA) 300 MG TABS tablet, Take 300 mg by mouth daily before breakfast., Disp: , Rfl:  .  carvedilol (COREG) 12.5 MG tablet, Take 1 tablet (12.5 mg total) by mouth 2 (two) times daily with a meal., Disp: 60 tablet, Rfl: 6 .  ENTRESTO 97-103 MG, TAKE 1 TABLET BY MOUTH TWICE DAILY, Disp: 60 tablet, Rfl: 6 .  furosemide (LASIX) 20 MG tablet, Take 3 tablets (60 mg total) by mouth 2 (two) times daily., Disp: 180 tablet, Rfl: 3 .  isosorbide-hydrALAZINE (BIDIL) 20-37.5 MG tablet, Take 1 tablet by mouth 3 (three) times daily., Disp: 90 tablet, Rfl: 3 .  ivabradine (CORLANOR) 5 MG TABS tablet, Take 1 tablet (5 mg total) by mouth 2 (two) times daily with a meal., Disp: 60 tablet, Rfl: 10 .  potassium chloride SA (K-DUR,KLOR-CON) 20 MEQ tablet, Take 1 tablet (20 mEq total) by mouth 2 (two) times daily., Disp: 180 tablet, Rfl: 3 .  sitaGLIPtin-metformin (JANUMET) 50-1000 MG tablet, Take 1 tablet by mouth 2 (two) times daily with a meal., Disp: , Rfl:  .  spironolactone (ALDACTONE) 25 MG tablet, Take 1 tablet (25 mg total) by mouth at bedtime., Disp: 90 tablet, Rfl: 2 .  mesalamine (LIALDA) 1.2 g EC tablet, Take 1.2 g 2 (two) times daily by mouth., Disp: ,  Rfl:  No Known Allergies    Social History   Socioeconomic History  . Marital status: Single    Spouse name: Not on file  . Number of children: Not on file  . Years of education: Not on file  . Highest education level: Not on file  Occupational History  . Occupation: Geophysicist/field seismologist for [employer]    Employer: [employer]  . Financial resource  strain: Not very hard  . Food insecurity:    Worry: Never true    Inability: Never true  . Transportation needs:    Medical: No    Non-medical: No  Tobacco Use  . Smoking status: Current Every Day Smoker    Packs/day: 1.00    Years: 33.00    Pack years: 33.00    Types: Cigarettes  . Smokeless tobacco: Never Used  Substance and Sexual Activity  . Alcohol use: No    Comment: QUIT IN 2000  . Drug use: No    Comment: quit 12 years ago  . Sexual activity: Never  Lifestyle  . Physical activity:    Days per week: Not on file    Minutes per session: Not on file  . Stress: Not on file  Relationships  . Social connections:    Talks on phone: Not on file    Gets together: Not on file    Attends religious service: Not on file    Active member of club or organization: Not on file    Attends meetings of clubs or organizations: Not on file    Relationship status: Not on file  . Intimate partner violence:    Fear of current or ex partner: Not on file    Emotionally abused: Not on file    Physically abused: Not on file    Forced sexual activity: Not on file  Other Topics Concern  . Not on file  Social History Narrative   Lives in [place], [place] with housemate.     Physical Exam Cardiovascular:     Rate and Rhythm: Normal rate.  Pulmonary:     Effort: Pulmonary effort is normal.     Breath sounds: Normal breath sounds.  Musculoskeletal: Normal range of motion.  Skin:    Capillary Refill: Capillary refill takes less than 2 seconds.  Neurological:     Mental Status: He is alert.  Psychiatric:        Mood and Affect: Mood normal.         Future Appointments  Date Time Provider [place]  08/21/2018  1:00 PM [place] ECHO 1-BUZZ [facility] [place]  08/21/2018  2:00 PM Larey Dresser, MD [facility] None  09/10/2018  7:25 AM [facility] DEVICE REMOTES [facility]  10/15/2018 10:30 AM [facility] PA/NP [facility] None    BP 98/62 (BP Location: Left Arm, Patient Position:  Sitting, Cuff Size: Normal)   Pulse 78   Resp 16   Wt 278 lb (126.1 kg)   SpO2 97%   BMI 37.70 kg/m   Weight yesterday- 278 lb Last visit weight- 280 lb  David Bright was seen at home today and reported feeling generally well He denied chest pain, SOB, headache, dizziness or orthopnea. He stated he has been mostly compliant with his medications however he tends to forget to take his afternoon medications. He is trying to sign up for assistance via his insurance to get a home health aid who can help him with this. His main concern right now is that  he over-drafted his bank account and needs help getting his medications and groceries. I spoke to Tammy Sours, [place], at the HF clinic and she advised they would be able to assist with this. He would like a pillbox so I advised I would get him one on Friday when he has his follow up with Dr Aundra Dubin. He was understanding and agreeable.   David Bright, EMT 08/19/18  ACTION: Home visit completed Next visit planned for 1 week

## 2018-08-20 ENCOUNTER — Telehealth (HOSPITAL_COMMUNITY): Payer: Self-pay | Admitting: Licensed Clinical Social Worker

## 2018-08-20 NOTE — Telephone Encounter (Signed)
CSW informed by community paramedic that patient is having issues paying for food and medication due to unexpected costs this month.  CSW called patient who confirmed he is running low on medications and will not be able to afford- pt gets medications from [facility].  CSW will be able to provide pt with giftcard to [employer] to assist with medication costs.  Pt also reported that he is running low on food and does not have funds to buy more at this time.  Pt is familiar with local food pantries and has no barriers to getting to pantry to get more food- pt has no concerns about obtaining sufficient food at this time.  CSW encouraged pt to contact CSW or inform paramedic if he runs out of options for food pantries and we would help refer him to alternative options.  CSW will continue to follow and assist as needed  Jorge Ny, LCSW Clinical Social Worker [facility] [phone]

## 2018-08-21 ENCOUNTER — Emergency Department (HOSPITAL_COMMUNITY): Payer: 59

## 2018-08-21 ENCOUNTER — Emergency Department (HOSPITAL_COMMUNITY)
Admission: EM | Admit: 2018-08-21 | Discharge: 2018-08-21 | Disposition: A | Discharge status: Home / Self Care | Payer: 59 | Attending: Emergency Medicine | Admitting: Emergency Medicine

## 2018-08-21 ENCOUNTER — Encounter (HOSPITAL_COMMUNITY): Payer: Self-pay | Admitting: Cardiology

## 2018-08-21 ENCOUNTER — Other Ambulatory Visit: Payer: Self-pay

## 2018-08-21 ENCOUNTER — Ambulatory Visit (HOSPITAL_BASED_OUTPATIENT_CLINIC_OR_DEPARTMENT_OTHER)
Admission: RE | Admit: 2018-08-21 | Discharge: 2018-08-21 | Disposition: A | Discharge status: Home / Self Care | Payer: 59 | Source: Ambulatory Visit

## 2018-08-21 ENCOUNTER — Ambulatory Visit (HOSPITAL_BASED_OUTPATIENT_CLINIC_OR_DEPARTMENT_OTHER)
Admission: RE | Admit: 2018-08-21 | Discharge: 2018-08-21 | Disposition: A | Discharge status: Home / Self Care | Payer: 59 | Source: Ambulatory Visit | Attending: Cardiology | Admitting: Cardiology

## 2018-08-21 ENCOUNTER — Encounter (HOSPITAL_COMMUNITY): Payer: Self-pay | Admitting: Emergency Medicine

## 2018-08-21 VITALS — BP 62/42 | HR 81 | Wt 275.8 lb

## 2018-08-21 DIAGNOSIS — Z95810 Presence of automatic (implantable) cardiac defibrillator: Secondary | ICD-10-CM | POA: Insufficient documentation

## 2018-08-21 DIAGNOSIS — E1142 Type 2 diabetes mellitus with diabetic polyneuropathy: Secondary | ICD-10-CM | POA: Insufficient documentation

## 2018-08-21 DIAGNOSIS — I5022 Chronic systolic (congestive) heart failure: Secondary | ICD-10-CM | POA: Insufficient documentation

## 2018-08-21 DIAGNOSIS — E785 Hyperlipidemia, unspecified: Secondary | ICD-10-CM | POA: Insufficient documentation

## 2018-08-21 DIAGNOSIS — N179 Acute kidney failure, unspecified: Secondary | ICD-10-CM | POA: Insufficient documentation

## 2018-08-21 DIAGNOSIS — G4733 Obstructive sleep apnea (adult) (pediatric): Secondary | ICD-10-CM | POA: Insufficient documentation

## 2018-08-21 DIAGNOSIS — Z720 Tobacco use: Secondary | ICD-10-CM | POA: Diagnosis not present

## 2018-08-21 DIAGNOSIS — Z7982 Long term (current) use of aspirin: Secondary | ICD-10-CM | POA: Insufficient documentation

## 2018-08-21 DIAGNOSIS — I959 Hypotension, unspecified: Secondary | ICD-10-CM | POA: Insufficient documentation

## 2018-08-21 DIAGNOSIS — F17210 Nicotine dependence, cigarettes, uncomplicated: Secondary | ICD-10-CM | POA: Insufficient documentation

## 2018-08-21 DIAGNOSIS — Z8249 Family history of ischemic heart disease and other diseases of the circulatory system: Secondary | ICD-10-CM

## 2018-08-21 DIAGNOSIS — E1151 Type 2 diabetes mellitus with diabetic peripheral angiopathy without gangrene: Secondary | ICD-10-CM

## 2018-08-21 DIAGNOSIS — Z79899 Other long term (current) drug therapy: Secondary | ICD-10-CM | POA: Diagnosis not present

## 2018-08-21 DIAGNOSIS — Z7984 Long term (current) use of oral hypoglycemic drugs: Secondary | ICD-10-CM | POA: Diagnosis not present

## 2018-08-21 DIAGNOSIS — I5042 Chronic combined systolic (congestive) and diastolic (congestive) heart failure: Secondary | ICD-10-CM | POA: Diagnosis not present

## 2018-08-21 DIAGNOSIS — J449 Chronic obstructive pulmonary disease, unspecified: Secondary | ICD-10-CM | POA: Diagnosis not present

## 2018-08-21 DIAGNOSIS — I428 Other cardiomyopathies: Secondary | ICD-10-CM | POA: Insufficient documentation

## 2018-08-21 DIAGNOSIS — E119 Type 2 diabetes mellitus without complications: Secondary | ICD-10-CM | POA: Insufficient documentation

## 2018-08-21 DIAGNOSIS — I110 Hypertensive heart disease with heart failure: Secondary | ICD-10-CM | POA: Insufficient documentation

## 2018-08-21 LAB — BASIC METABOLIC PANEL
ANION GAP: 11 (ref 5–15)
BUN: 45 mg/dL — AB (ref 6–20)
CALCIUM: 9.3 mg/dL (ref 8.9–10.3)
CO2: 24 mmol/L (ref 22–32)
Chloride: 101 mmol/L (ref 98–111)
Creatinine, Ser: 2.1 mg/dL — ABNORMAL HIGH (ref 0.61–1.24)
GFR calc Af Amer: 39 mL/min — ABNORMAL LOW (ref >60)
GFR, EST NON AFRICAN AMERICAN: 34 mL/min — AB (ref >60)
GLUCOSE: 137 mg/dL — AB (ref 70–99)
Potassium: 4.9 mmol/L (ref 3.5–5.1)
Sodium: 136 mmol/L (ref 135–145)

## 2018-08-21 LAB — CBC
HCT: 42.4 % (ref 39.0–52.0)
Hemoglobin: 13.4 g/dL (ref 13.0–17.0)
MCH: 29.3 pg (ref 26.0–34.0)
MCHC: 31.6 g/dL (ref 30.0–36.0)
MCV: 92.6 fL (ref 80.0–100.0)
Platelets: 186 10*3/uL (ref 150–400)
RBC: 4.58 MIL/uL (ref 4.22–5.81)
RDW: 12.5 % (ref 11.5–15.5)
WBC: 5.6 10*3/uL (ref 4.0–10.5)
nRBC: 0 % (ref 0.0–0.2)

## 2018-08-21 LAB — I-STAT TROPONIN, ED: TROPONIN I, POC: 0.01 ng/mL (ref 0.00–0.08)

## 2018-08-21 MED ORDER — SODIUM CHLORIDE 0.9 % IV BOLUS
1000.0000 mL | Freq: Once | INTRAVENOUS | Status: AC
  Administered 2018-08-21: 1000 mL via INTRAVENOUS

## 2018-08-21 MED ORDER — CARVEDILOL 6.25 MG PO TABS: 6.2500 mg | ORAL_TABLET | Freq: Two times a day (BID) | ORAL | Status: DC

## 2018-08-21 NOTE — Patient Instructions (Signed)
STOP entresto  STOP Bidil  STOP Corlanor  STOP Spironolactone  DECREASE Coreg 6.25mg  (1 tab) twice daily  Your physician suggest you go to the emergency room.  Follow up with the Advanced Practice Provider in 1 week

## 2018-08-21 NOTE — ED Provider Notes (Signed)
[facility] EMERGENCY DEPARTMENT Provider Note   CSN: [id number] Arrival date & time: 08/21/18  1418     History   Chief Complaint No chief complaint on file.   HPI David Bright is a 58 y.o. male.  Pt presents to the ED today with low BP.  The pt has a hx of chronic systolic CHF with nonischemic CM with an EF of 20%.  He does follow with Dr. Aundra Dubin at the [facility] clinic.  The pt was getting an ECHO today and BP 62/42.  They did perform the ECHO which showed his EF up to 55-60%.  They told him to come to the ED for further evaluation.  They did tell him to stop Entresto, Bidil, Corlanor, Spironolactone, and to decrease Coreg.  Pt said he feels fine.       Past Medical History:  Diagnosis Date  . Chronic combined systolic and diastolic CHF (congestive heart failure) (HCC)    Systolic and diastolic  . COPD (chronic obstructive pulmonary disease) ([place])   . Hyperlipidemia   . Hypertension   . Non compliance with medical treatment   . Non-ischemic cardiomyopathy ([place])    a. Normal cath 2002;  b. 01/2015 Echo: EF 15-20%, mod conc LVH, restrictive physiology, mod MR, sev dil LA, triv TR/PI;  c. 02/2015 Cath: LM nl, LAD 30d, LCX nl, OM1 small, nl, RCA nl;  d. 02/2015 s/p MDT single lead AICD (ser# [id number]).  . OSA (obstructive sleep apnea) 06/04/2015   Mild with AHI 12.3/hr and AHI 24.5/hr in REM sleep.  . Polysubstance abuse ([place])    History of quitting alcohol, marijuana and crack over 12 years ago  . Type II diabetes mellitus [facility])     Patient Active Problem List   Diagnosis Date Noted  . Peripheral arterial disease ([place]) 10/29/2016  . BMI 36.0-36.9,adult 09/25/2016  . Vitamin D deficiency 09/18/2016  . Polysubstance abuse ([place]) 07/18/2016  . AKI (acute kidney injury) ([place]) 05/13/2016  . Bilateral ocular hypertension 01/08/2016  . Nuclear sclerotic cataract of both eyes 01/08/2016  . Posterior subcapsular polar age-related cataract of left eye 01/08/2016    . Presbyopia 01/08/2016  . Regular astigmatism of both eyes 01/08/2016  . Nicotine dependence 09/05/2015  . Upper respiratory infection, viral 09/05/2015  . Acute on chronic systolic and diastolic heart failure, NYHA class 3 ([place]) 08/09/2015  . S/P ICD (internal cardiac defibrillator) procedure 08/09/2015  . Acute on chronic combined systolic and diastolic congestive heart failure ([place]) 08/09/2015  . History of biliary T-tube placement 08/09/2015  . Hypertension 07/04/2015  . Obstructive sleep apnea syndrome 06/04/2015  . Type 2 diabetes mellitus ([place]) 05/16/2015  . Type 2 diabetes mellitus without complication ([place]) 97/67/3419  . Palpitations 01/26/2015  . SOB (shortness of breath) 10/20/2014  . DOE (dyspnea on exertion) 10/20/2014  . Abnormal EKG 10/20/2014  . Dyspnea on exertion 10/20/2014  . Chronic combined systolic and diastolic CHF (congestive heart failure) ([place]) 10/19/2013  . NICM (nonischemic cardiomyopathy) ([place]) 11/09/2011  . OSA on CPAP 11/09/2011  . HTN (hypertension) 11/09/2011  . Hyperlipidemia 11/09/2011  . Chronic obstructive pulmonary disease ([place]) 11/09/2011  . DM (diabetes mellitus) ([place]) 11/09/2011  . Acute respiratory failure ([place]) 11/07/2011    Past Surgical History:  Procedure Laterality Date  . CARDIAC CATHETERIZATION N/A 03/06/2015   Procedure: Right/Left Heart Cath and Coronary Angiography;  Surgeon: Belva Crome, MD;  Location: [facility] CV LAB;  Service: Cardiovascular;  Laterality: N/A;  . EP IMPLANTABLE  DEVICE N/A 03/06/2015   Procedure: ICD Implant;  Surgeon: Deboraha Sprang, MD;  Location: [facility] CV LAB;  Service: Cardiovascular;  Laterality: N/A;  . none          Home Medications    Prior to Admission medications   Medication Sig Start Date End Date Taking? Authorizing Provider  aspirin EC 81 MG tablet Take 1 tablet (81 mg total) by mouth daily. 10/19/13   Dorothy Spark, MD  atorvastatin (LIPITOR) 20 MG tablet Take 1 tablet (20  mg total) by mouth daily. 10/13/17   Larey Dresser, MD  canagliflozin (INVOKANA) 300 MG TABS tablet Take 300 mg by mouth daily before breakfast.    [provider]  carvedilol (COREG) 6.25 MG tablet Take 1 tablet (6.25 mg total) by mouth 2 (two) times daily with a meal. 08/21/18   Larey Dresser, MD  furosemide (LASIX) 20 MG tablet Take 3 tablets (60 mg total) by mouth 2 (two) times daily. 07/30/18 10/28/18  Larey Dresser, MD  mesalamine (LIALDA) 1.2 g EC tablet Take 1.2 g 2 (two) times daily by mouth.    [provider]  potassium chloride SA (K-DUR,KLOR-CON) 20 MEQ tablet Take 1 tablet (20 mEq total) by mouth 2 (two) times daily. 07/27/18   Larey Dresser, MD  sitaGLIPtin-metformin (JANUMET) 50-1000 MG tablet Take 1 tablet by mouth 2 (two) times daily with a meal.    [provider]    Family History Family History  Problem Relation Age of Onset  . Cancer Mother     Social History Social History   Tobacco Use  . Smoking status: Current Every Day Smoker    Packs/day: 1.00    Years: 33.00    Pack years: 33.00    Types: Cigarettes  . Smokeless tobacco: Never Used  Substance Use Topics  . Alcohol use: No    Comment: QUIT IN 2000  . Drug use: No    Comment: quit 12 years ago     Allergies   Patient has no known allergies.   Review of Systems Review of Systems  All other systems reviewed and are negative.    Physical Exam Updated Vital Signs BP (!) 104/58   Pulse 76   Temp 98.4 F (36.9 C) (Oral)   Resp 17   SpO2 100%   Physical Exam Vitals signs and nursing note reviewed.  Constitutional:      Appearance: Normal appearance.  HENT:     Head: Normocephalic and atraumatic.     Right Ear: External ear normal.     Left Ear: External ear normal.     Nose: Nose normal.     Mouth/Throat:     Mouth: Mucous membranes are moist.     Pharynx: Oropharynx is clear.  Eyes:     Extraocular Movements: Extraocular movements intact.      Conjunctiva/sclera: Conjunctivae normal.     Pupils: Pupils are equal, round, and reactive to light.  Neck:     Musculoskeletal: Normal range of motion and neck supple.  Cardiovascular:     Rate and Rhythm: Normal rate and regular rhythm.     Pulses: Normal pulses.     Heart sounds: Normal heart sounds.  Pulmonary:     Effort: Pulmonary effort is normal.     Breath sounds: Normal breath sounds.  Abdominal:     General: Abdomen is flat. Bowel sounds are normal.     Palpations: Abdomen is soft.  Musculoskeletal: Normal  range of motion.  Skin:    General: Skin is warm and dry.     Capillary Refill: Capillary refill takes less than 2 seconds.  Neurological:     General: No focal deficit present.     Mental Status: He is alert and oriented to person, place, and time.  Psychiatric:        Mood and Affect: Mood normal.        Behavior: Behavior normal.        Thought Content: Thought content normal.        Judgment: Judgment normal.      ED Treatments / Results  Labs (all labs ordered are listed, but only abnormal results are displayed) Labs Reviewed  BASIC METABOLIC PANEL - Abnormal; Notable for the following components:      Result Value   Glucose, Bld 137 (*)    BUN 45 (*)    Creatinine, Ser 2.10 (*)    GFR calc non Af Amer 34 (*)    GFR calc Af Amer 39 (*)    All other components within normal limits  CBC  I-STAT TROPONIN, ED    EKG None  Radiology Dg Chest Port 1 View  Result Date: 08/21/2018 CLINICAL DATA:  Hypotension EXAM: PORTABLE CHEST 1 VIEW COMPARISON:  01/08/2017 FINDINGS: Heart size mildly enlarged. AICD unchanged in position. Negative for heart failure Left lower lobe airspace disease is new since the prior study may represent atelectasis or pneumonia. Possible small left effusion. Right lung clear. IMPRESSION: Left lower lobe atelectasis/pneumonia has developed since the prior study. Electronically Signed   By: Franchot Gallo M.D.   On: 08/21/2018 15:05     Procedures Procedures (including critical care time)  Medications Ordered in ED Medications  sodium chloride 0.9 % bolus 1,000 mL (1,000 mLs Intravenous New Bag/Given 08/21/18 1454)     Initial Impression / Assessment and Plan / ED Course  I have reviewed the triage vital signs and the nursing notes.  Pertinent labs & imaging results that were available during my care of the patient were reviewed by me and considered in my medical decision making (see chart for details).    Pt's BP is up to 100.  CXR shows pna vs atelectasis.  Pt has no clinical signs of pneumonia, so it's likely atelectasis.  Kidney function is worse since labs on 08/10/18.  Possibly due to increased lasix (increased to 60 mg bid on 12/19) and hypotension.  Pt d/w PA for Dr. Aundra Dubin.  Their office will follow.  They are ok with pt going home if bp improved.  Pt stable for d/c.  Return if worse.  Final Clinical Impressions(s) / ED Diagnoses   Final diagnoses:  Hypotension, unspecified hypotension type  AKI (acute kidney injury) [facility])    ED Discharge Orders    None       Isla Pence, MD 08/21/18 1606

## 2018-08-21 NOTE — Discharge Instructions (Signed)
Stop Entresto, Bidil, Corlanor, Spironolactone.  Decrease Coreg to 6.25 mg twice a day.

## 2018-08-21 NOTE — ED Triage Notes (Signed)
Patient arrived from Cardiologist office reporting that he was there for a check up and sent here because his Blood pressure was too low. Patient is A&O X 4

## 2018-08-21 NOTE — ED Notes (Signed)
Called Heart and Vascular

## 2018-08-21 NOTE — ED Notes (Signed)
D/c reviewed with patient 

## 2018-08-21 NOTE — Progress Notes (Signed)
PCP: [facility] HF Cardiology: Dr. Aundra Dubin  58 y.o. with history of chronic systolic CHF and COPD/active smoking presents for followup of CHF.  He has a known nonischemic cardiomyopathy, echo in 1/17 with EF 20% and a severely dilated LV.  No coronary disease on 7/16 cardiac cath.  Repeat echo was 2/18, showing EF up to 40%.   He is trying stop smoking with Chantix, down to 2-3 cigs/day. Still with leg fatigue/cramping after walking 100 yards.  He had significant PAD by peripheral arterial dopplers, saw Dr. Gwenlyn Found with plan of medical management for now. He also has knee pain which is limiting.   Echo 09/2017: EF up to 50-55% with mild LVH.   He returns today for HF followup. At last appointment, he was volume overloaded and we increased his Lasix.  Today, he came for an echo.  I reviewed his echo, EF 55-60% with mild LVH and normal RV.  In the echo lab, SBP in 60s but he was mentating and not lightheaded unless he stood up quickly.  He came to CHF clinic for his appointment, SBP again noted to be in the 60s.  He was not drowsy or lethargic, only mild lightheadedness with standing.  He has been taking all his cardiac medications as ordered. Weight is down 5 lbs.  No lightheadedness prior to today.  Breathing has been better since last appointment with weight loss on higher Lasix.     Medtronic device interrogated: Fluid index < threshold, with impedance trending up.   Labs (12/17): K 4.3, creatinine 1.09 Labs (1/18): K 3.9, creatinine 1.13 Labs (3/18): K 4.3, creatinine 1.48, NT-proBNP 61 Labs (4/18): K 4.5, creatinine 1.23, LDL 71, BNP 34 Labs (5/18): K 4.5, creatinine 1.2 Labs (1/19): K 4.2, creatinine 1.19, LDL 69, HDL 39 Labs (11/19): LDL 66, HDL 34, K 3.3, creatinine 1.19, BNP 45 Labs (12/19): K 4.4, creatinine 1.59  PMH:  1. Chronic systolic CHF: Nonischemic cardiomyopathy.  Has Medtronic ICD.   - LHC (7/16) with nonobstructive disease.  - Echo (1/17) with EF 20%, severe LV dilation,  mild to moderate MR.  - Echo (2/18): EF 40%, mild LV dilation.  - Echo (5/19): EF 50-55%, mild LVH - Echo (1/20): EF 55-60%, mild LVH, normal RV size and systolic function.  2. COPD: Active smoker.  - PFTs (2/18) with minimal obstruction, mild restriction suggesting a parenchymal process, low DLCO.  - CT chest (6/18): No ILD, +emphysema, lung nodules.  - CT chest (11/19): Emphysema, no ILD, stable nodules 3. Type II diabetes with peripheral neuropathy.  4. OSA on CPAP 5. HTN 6. Hyperlipidemia 7. PAD: ABIs abnormal at outside office.  - Peripheral arterial dopplers (3/18) with > 50% left CIA and bilateral EIA stenosis, 50-74% mid LSFA stenosis.  - ABIs (2/19): 0.85 right, 0.86 left (mild).   SH: Lives in [place], works at [employer], smokes about 4-5 cigs/day now.  Rare ETOH.  Remote substance abuse.   FH: Aunt with CHF, brother with ESRD.    Review of systems complete and found to be negative unless listed in HPI.   Current Outpatient Medications  Medication Sig Dispense Refill  . aspirin EC 81 MG tablet Take 1 tablet (81 mg total) by mouth daily. 30 tablet 0  . atorvastatin (LIPITOR) 20 MG tablet Take 1 tablet (20 mg total) by mouth daily. 30 tablet 3  . canagliflozin (INVOKANA) 300 MG TABS tablet Take 300 mg by mouth daily before breakfast.    . carvedilol (  COREG) 6.25 MG tablet Take 1 tablet (6.25 mg total) by mouth 2 (two) times daily with a meal.    . furosemide (LASIX) 20 MG tablet Take 3 tablets (60 mg total) by mouth 2 (two) times daily. 180 tablet 3  . mesalamine (LIALDA) 1.2 g EC tablet Take 1.2 g 2 (two) times daily by mouth.    . potassium chloride SA (K-DUR,KLOR-CON) 20 MEQ tablet Take 1 tablet (20 mEq total) by mouth 2 (two) times daily. 180 tablet 3  . sitaGLIPtin-metformin (JANUMET) 50-1000 MG tablet Take 1 tablet by mouth 2 (two) times daily with a meal.     No current facility-administered medications for this encounter.    BP (!) 62/42 (BP Location: Right  Arm, Patient Position: Sitting, Cuff Size: Large)   Pulse 81   Wt 125.1 kg (275 lb 12.8 oz)   SpO2 99%   BMI 37.41 kg/m  General: NAD Neck: No JVD, no thyromegaly or thyroid nodule.  Lungs: Clear to auscultation bilaterally with normal respiratory effort. CV: Nondisplaced PMI.  Heart regular S1/S2, no S3/S4, no murmur.  No peripheral edema.  No carotid bruit.  Normal pedal pulses.  Abdomen: Soft, nontender, no hepatosplenomegaly, no distention.  Skin: Intact without lesions or rashes.  Neurologic: Alert and oriented x 3.  Psych: Normal affect. Extremities: No clubbing or cyanosis.  HEENT: Normal.   Assessment/Plan: 1. Chronic systolic CHF: Nonischemic cardiomyopathy => ?due to viral myocarditis or prior substance abuse.  Medtronic ICD.  Echo 1/17 with severely dilated LV and EF 20%, EF up to 40% on echo 2/18.  Echo today was reviewed, EF up to 55% with normal RV.  His weight is down with recent increase in Lasix and he is not volume overloaded by exam or Optivol.  However, he is markedly hypertensive with SBP in 60s on repeated checks.  Surprisingly, he is minimal lightheaded. - Hold Lasix today and tomorrow, decrease to 60 mg daily.  BMET today.    - Stop Entresto, Corlanor, spironolactone, and Bidil.  - Decrease Coreg to 6.25 mg bid but do not take again until tomorrow.   - I will send him to the ER for evaluation now.  However, suspect hypotension may be medication-related.  2. Smoking/suspect COPD:  PFTs done, somewhat concerning for interstitial lung disease rather than emphysema with restrictive PFTs and decreased DLCO => no ILD on CT, emphysema noted along with lung nodules. Most recent repeat CT in 11/19 showed stable nodules, do not need repeat. He continues to smoke 4-5 cigarettes/day.  - Use Chantix.  3. OSA: Continue CPAP. No change.  4. PAD: Significant disease on peripheral dopplers, stable mild claudication. He saw Dr. Gwenlyn Found, medical management recommended.  I encouraged him  to try to walk at least a short distance through the pain.  - Continue ASA 81 and statin. Good lipids in 1/19.   - Repeat ABIs due and will need followup with Dr. Gwenlyn Found.   Followup next week with NP/PA to reassess BP.   Loralie Champagne 08/21/2018

## 2018-08-21 NOTE — Progress Notes (Signed)
  Echocardiogram 2D Echocardiogram has been performed.  Johny Chess 08/21/2018, 1:45 PM

## 2018-08-26 ENCOUNTER — Other Ambulatory Visit (HOSPITAL_COMMUNITY): Payer: Self-pay

## 2018-08-26 NOTE — Progress Notes (Signed)
Paramedicine Encounter    Patient ID: David Bright, male    DOB: 1961-03-24, 58 y.o.   MRN: [id number]   Patient Care Team: David Spark, MD as PCP - Cardiology (Cardiology) David Bright, David Conception, MD (Inactive) (Cardiology) David Bright as Physician Assistant (Physician Assistant) David Ny, LCSW as Social Worker (Licensed Clinical Social Worker)  Patient Active Problem List   Diagnosis Date Noted  . Peripheral arterial disease ([place]) 10/29/2016  . BMI 36.0-36.9,adult 09/25/2016  . Vitamin D deficiency 09/18/2016  . Polysubstance abuse ([place]) 07/18/2016  . AKI (acute kidney injury) ([place]) 05/13/2016  . Bilateral ocular hypertension 01/08/2016  . Nuclear sclerotic cataract of both eyes 01/08/2016  . Posterior subcapsular polar age-related cataract of left eye 01/08/2016  . Presbyopia 01/08/2016  . Regular astigmatism of both eyes 01/08/2016  . Nicotine dependence 09/05/2015  . Upper respiratory infection, viral 09/05/2015  . Acute on chronic systolic and diastolic heart failure, NYHA class 3 ([place]) 08/09/2015  . S/P ICD (internal cardiac defibrillator) procedure 08/09/2015  . Acute on chronic combined systolic and diastolic congestive heart failure ([place]) 08/09/2015  . History of biliary T-tube placement 08/09/2015  . Hypertension 07/04/2015  . Obstructive sleep apnea syndrome 06/04/2015  . Type 2 diabetes mellitus ([place]) 05/16/2015  . Type 2 diabetes mellitus without complication ([place]) 37/85/8850  . Palpitations 01/26/2015  . SOB (shortness of breath) 10/20/2014  . DOE (dyspnea on exertion) 10/20/2014  . Abnormal EKG 10/20/2014  . Dyspnea on exertion 10/20/2014  . Chronic combined systolic and diastolic CHF (congestive heart failure) ([place]) 10/19/2013  . NICM (nonischemic cardiomyopathy) ([place]) 11/09/2011  . OSA on CPAP 11/09/2011  . HTN (hypertension) 11/09/2011  . Hyperlipidemia 11/09/2011  . Chronic obstructive pulmonary disease ([place]) 11/09/2011  . DM (diabetes  mellitus) ([place]) 11/09/2011  . Acute respiratory failure ([place]) 11/07/2011    Current Outpatient Medications:  .  aspirin EC 81 MG tablet, Take 1 tablet (81 mg total) by mouth daily., Disp: 30 tablet, Rfl: 0 .  atorvastatin (LIPITOR) 20 MG tablet, Take 1 tablet (20 mg total) by mouth daily., Disp: 30 tablet, Rfl: 3 .  canagliflozin (INVOKANA) 300 MG TABS tablet, Take 300 mg by mouth daily before breakfast., Disp: , Rfl:  .  carvedilol (COREG) 6.25 MG tablet, Take 1 tablet (6.25 mg total) by mouth 2 (two) times daily with a meal., Disp: , Rfl:  .  furosemide (LASIX) 20 MG tablet, Take 3 tablets (60 mg total) by mouth 2 (two) times daily., Disp: 180 tablet, Rfl: 3 .  potassium chloride SA (K-DUR,KLOR-CON) 20 MEQ tablet, Take 1 tablet (20 mEq total) by mouth 2 (two) times daily., Disp: 180 tablet, Rfl: 3 .  sitaGLIPtin-metformin (JANUMET) 50-1000 MG tablet, Take 1 tablet by mouth 2 (two) times daily with a meal., Disp: , Rfl:  .  mesalamine (LIALDA) 1.2 g EC tablet, Take 1.2 g 2 (two) times daily by mouth., Disp: , Rfl:  No Known Allergies    Social History   Socioeconomic History  . Marital status: Single    Spouse name: Not on file  . Number of children: Not on file  . Years of education: Not on file  . Highest education level: Not on file  Occupational History  . 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 Financial resource strain: Not very hard  . Food insecurity:    Worry: Never true    Inability: Never true  .  Transportation needs:    Medical: No    Non-medical: No  Tobacco Use  . Smoking status: Current Every Day Smoker    Packs/day: 1.00    Years: 33.00    Pack years: 33.00    Types: Cigarettes  . Smokeless tobacco: Never Used  Substance and Sexual Activity  . Alcohol use: No    Comment: QUIT IN 2000  . Drug use: No    Comment: quit 12 years ago  . Sexual activity: Never  Lifestyle  . Physical activity:    Days per week:  Not on file    Minutes per session: Not on file  . Stress: Not on file  Relationships  . Social connections:    Talks on phone: Not on file    Gets together: Not on file    Attends religious service: Not on file    Active member of club or organization: Not on file    Attends meetings of clubs or organizations: Not on file    Relationship status: Not on file  . Intimate partner violence:    Fear of current or ex partner: Not on file    Emotionally abused: Not on file    Physically abused: Not on file    Forced sexual activity: Not on file  Other Topics Concern  . Not on file  Social History Narrative   Lives in [place], [place] with housemate.     Physical Exam Cardiovascular:     Rate and Rhythm: Normal rate and regular rhythm.     Pulses: Normal pulses.  Pulmonary:     Effort: Pulmonary effort is normal.     Breath sounds: Normal breath sounds.  Musculoskeletal: Normal range of motion.     Right lower leg: Edema present.     Left lower leg: Edema present.  Skin:    General: Skin is warm and dry.     Capillary Refill: Capillary refill takes less than 2 seconds.  Neurological:     Mental Status: He is alert.  Psychiatric:        Mood and Affect: Mood normal.         Future Appointments  Date Time Provider [place]  08/31/2018 12:00 PM [facility] PA/NP [facility] None  09/10/2018  7:25 AM [facility] DEVICE REMOTES [facility]  10/15/2018 10:30 AM [facility] PA/NP [facility] None    BP 118/66 (BP Location: Left Arm, Patient Position: Sitting, Cuff Size: Large)   Pulse 80   Resp 16   Wt 273 lb (123.8 kg)   SpO2 98%   BMI 37.03 kg/m   Weight yesterday- 273 lb Last visit weight- 275  David Bright was seen at home today and reported feeling generally well. He denied any episodes of chest pain, SOB, headache, dizziness or orthopnea since his episode of hypotension last week. He stated he was taken off several medications after leaving but was told to  restart them on Monday by someone at the clinic. I was unable to find a note about this but he said he had been taking the medications for the past few days and his vital signs were within normal limits. He was able to get his medications via a [employer] gift card from the clinic last week and do not have any concerns over food security at this time. His medications were verified and he was provided a pillbox, though he stated he would fill it himself or have his home aid do it tomorrow.   Jacquiline Doe,  EMT 08/26/18  ACTION: Home visit completed Next visit planned for 1 week

## 2018-08-31 ENCOUNTER — Ambulatory Visit (HOSPITAL_COMMUNITY)
Admission: RE | Admit: 2018-08-31 | Discharge: 2018-08-31 | Disposition: A | Discharge status: Home / Self Care | Payer: 59 | Source: Ambulatory Visit | Attending: Internal Medicine | Admitting: Internal Medicine

## 2018-08-31 ENCOUNTER — Encounter (HOSPITAL_COMMUNITY): Payer: Self-pay

## 2018-08-31 VITALS — BP 120/78 | HR 91 | Wt 274.0 lb

## 2018-08-31 DIAGNOSIS — G4733 Obstructive sleep apnea (adult) (pediatric): Secondary | ICD-10-CM | POA: Insufficient documentation

## 2018-08-31 DIAGNOSIS — F17210 Nicotine dependence, cigarettes, uncomplicated: Secondary | ICD-10-CM | POA: Insufficient documentation

## 2018-08-31 DIAGNOSIS — I10 Essential (primary) hypertension: Secondary | ICD-10-CM

## 2018-08-31 DIAGNOSIS — Z7984 Long term (current) use of oral hypoglycemic drugs: Secondary | ICD-10-CM | POA: Diagnosis not present

## 2018-08-31 DIAGNOSIS — Z79899 Other long term (current) drug therapy: Secondary | ICD-10-CM | POA: Diagnosis not present

## 2018-08-31 DIAGNOSIS — E785 Hyperlipidemia, unspecified: Secondary | ICD-10-CM | POA: Insufficient documentation

## 2018-08-31 DIAGNOSIS — E1151 Type 2 diabetes mellitus with diabetic peripheral angiopathy without gangrene: Secondary | ICD-10-CM | POA: Insufficient documentation

## 2018-08-31 DIAGNOSIS — I5042 Chronic combined systolic (congestive) and diastolic (congestive) heart failure: Secondary | ICD-10-CM | POA: Diagnosis not present

## 2018-08-31 DIAGNOSIS — Z8249 Family history of ischemic heart disease and other diseases of the circulatory system: Secondary | ICD-10-CM | POA: Diagnosis not present

## 2018-08-31 DIAGNOSIS — Z7982 Long term (current) use of aspirin: Secondary | ICD-10-CM | POA: Diagnosis not present

## 2018-08-31 DIAGNOSIS — Z720 Tobacco use: Secondary | ICD-10-CM | POA: Diagnosis not present

## 2018-08-31 DIAGNOSIS — Z95810 Presence of automatic (implantable) cardiac defibrillator: Secondary | ICD-10-CM

## 2018-08-31 DIAGNOSIS — I428 Other cardiomyopathies: Secondary | ICD-10-CM | POA: Diagnosis not present

## 2018-08-31 DIAGNOSIS — I110 Hypertensive heart disease with heart failure: Secondary | ICD-10-CM | POA: Diagnosis not present

## 2018-08-31 DIAGNOSIS — Z9989 Dependence on other enabling machines and devices: Secondary | ICD-10-CM

## 2018-08-31 DIAGNOSIS — I739 Peripheral vascular disease, unspecified: Secondary | ICD-10-CM

## 2018-08-31 DIAGNOSIS — J449 Chronic obstructive pulmonary disease, unspecified: Secondary | ICD-10-CM | POA: Insufficient documentation

## 2018-08-31 DIAGNOSIS — I5022 Chronic systolic (congestive) heart failure: Secondary | ICD-10-CM | POA: Diagnosis present

## 2018-08-31 LAB — BASIC METABOLIC PANEL
Anion gap: 14 (ref 5–15)
BUN: 29 mg/dL — ABNORMAL HIGH (ref 6–20)
CO2: 19 mmol/L — AB (ref 22–32)
Calcium: 9 mg/dL (ref 8.9–10.3)
Chloride: 105 mmol/L (ref 98–111)
Creatinine, Ser: 1.56 mg/dL — ABNORMAL HIGH (ref 0.61–1.24)
GFR calc Af Amer: 56 mL/min — ABNORMAL LOW (ref >60)
GFR calc non Af Amer: 48 mL/min — ABNORMAL LOW (ref >60)
Glucose, Bld: 197 mg/dL — ABNORMAL HIGH (ref 70–99)
Potassium: 4.3 mmol/L (ref 3.5–5.1)
Sodium: 138 mmol/L (ref 135–145)

## 2018-08-31 NOTE — Progress Notes (Signed)
PCP: [facility] HF Cardiology: Dr. Aundra Dubin  58 y.o. with history of chronic systolic CHF and COPD/active smoking presents for followup of CHF.  He has a known nonischemic cardiomyopathy, echo in 1/17 with EF 20% and a severely dilated LV.  No coronary disease on 7/16 cardiac cath.  Repeat echo was 2/18, showing EF up to 40%.   He is trying stop smoking with Chantix, down to 2-3 cigs/day. Still with leg fatigue/cramping after walking 100 yards.  He had significant PAD by peripheral arterial dopplers, saw Dr. Gwenlyn Found with plan of medical management for now. He also has knee pain which is limiting.   Echo 09/2017: EF up to 50-55% with mild LVH.   Echo 08/21/2018 EF up to 55% with normal RV.  He presents today for regular follow up. Last visit, multiple medications stopped in setting of hypotension. Seen in ED with improvement. He states he called the following week and told to restart his medications, so did. He denies any further lightheadedness or dizziness. No SOB or peripheral edema. He is taking all of his medications as previously ordered, apart from coreg, which was decreased.   Labs (12/17): K 4.3, creatinine 1.09 Labs (1/18): K 3.9, creatinine 1.13 Labs (3/18): K 4.3, creatinine 1.48, NT-proBNP 61 Labs (4/18): K 4.5, creatinine 1.23, LDL 71, BNP 34 Labs (5/18): K 4.5, creatinine 1.2 Labs (1/19): K 4.2, creatinine 1.19, LDL 69, HDL 39 Labs (11/19): LDL 66, HDL 34, K 3.3, creatinine 1.19, BNP 45 Labs (12/19): K 4.4, creatinine 1.59  PMH:  1. Chronic systolic CHF: Nonischemic cardiomyopathy.  Has Medtronic ICD.   - LHC (7/16) with nonobstructive disease.  - Echo (1/17) with EF 20%, severe LV dilation, mild to moderate MR.  - Echo (2/18): EF 40%, mild LV dilation.  - Echo (5/19): EF 50-55%, mild LVH - Echo (1/20): EF 55-60%, mild LVH, normal RV size and systolic function.  2. COPD: Active smoker.  - PFTs (2/18) with minimal obstruction, mild restriction suggesting a parenchymal process,  low DLCO.  - CT chest (6/18): No ILD, +emphysema, lung nodules.  - CT chest (11/19): Emphysema, no ILD, stable nodules 3. Type II diabetes with peripheral neuropathy.  4. OSA on CPAP 5. HTN 6. Hyperlipidemia 7. PAD: ABIs abnormal at outside office.  - Peripheral arterial dopplers (3/18) with > 50% left CIA and bilateral EIA stenosis, 50-74% mid LSFA stenosis.  - ABIs (2/19): 0.85 right, 0.86 left (mild).   SH: Lives in [place], works at [employer], smokes about 4-5 cigs/day now.  Rare ETOH.  Remote substance abuse.   FH: Aunt with CHF, brother with ESRD.    Review of systems complete and found to be negative unless listed in HPI.    Current Outpatient Medications  Medication Sig Dispense Refill  . aspirin EC 81 MG tablet Take 1 tablet (81 mg total) by mouth daily. 30 tablet 0  . atorvastatin (LIPITOR) 20 MG tablet Take 1 tablet (20 mg total) by mouth daily. 30 tablet 3  . canagliflozin (INVOKANA) 300 MG TABS tablet Take 300 mg by mouth daily before breakfast.    . carvedilol (COREG) 6.25 MG tablet Take 1 tablet (6.25 mg total) by mouth 2 (two) times daily with a meal.    . furosemide (LASIX) 40 MG tablet Take 40 mg by mouth 2 (two) times daily.    . isosorbide-hydrALAZINE (BIDIL) 20-37.5 MG tablet Take 1 tablet by mouth 3 (three) times daily.    . ivabradine (CORLANOR) 5 MG  TABS tablet Take 5 mg by mouth 2 (two) times daily with a meal.    . potassium chloride SA (K-DUR,KLOR-CON) 20 MEQ tablet Take 1 tablet (20 mEq total) by mouth 2 (two) times daily. 180 tablet 3  . sacubitril-valsartan (ENTRESTO) 97-103 MG Take 1 tablet by mouth 2 (two) times daily.    . sitaGLIPtin-metformin (JANUMET) 50-1000 MG tablet Take 1 tablet by mouth 2 (two) times daily with a meal.    . spironolactone (ALDACTONE) 25 MG tablet Take 25 mg by mouth daily.    . mesalamine (LIALDA) 1.2 g EC tablet Take 1.2 g 2 (two) times daily by mouth.     No current facility-administered medications for this  encounter.    Vitals:   08/31/18 1208  BP: 120/78  Pulse: 91  SpO2: 94%  Weight: 124.3 kg (274 lb)    Wt Readings from Last 3 Encounters:  08/31/18 124.3 kg (274 lb)  08/26/18 123.8 kg (273 lb)  08/21/18 125.1 kg (275 lb 12.8 oz)    General: Well appearing. No resp difficulty. HEENT: Normal Neck: Supple. JVP 5-6. Carotids 2+ bilat; no bruits. No thyromegaly or nodule noted. Cor: PMI nondisplaced. RRR, No M/G/R noted Lungs: CTAB, normal effort. Abdomen: Soft, non-tender, non-distended, no HSM. No bruits or masses. +BS  Extremities: No cyanosis, clubbing, or rash. R and LLE no edema.  Neuro: Alert & orientedx3, cranial nerves grossly intact. moves all 4 extremities w/o difficulty. Affect pleasant   Assessment/Plan: 1. Chronic systolic CHF: Nonischemic cardiomyopathy => ?due to viral myocarditis or prior substance abuse.  Medtronic ICD.  Echo 1/17 with severely dilated LV and EF 20%, EF up to 40% on echo 2/18.  Echo today was reviewed, EF up to 55% with normal RV.  His weight is down with recent increase in Lasix. Volume status stable on exam. No longer hypotension.  - Continue lasix 40 mg BID today.  - Continue Entresto 97/103 mg BId for now - Continue spironolactone 25 mg daily - Continue Bidil 1 tab TID.  - Continue coreg 6.25 mg BID.  2. Smoking/suspect COPD:  PFTs done, somewhat concerning for interstitial lung disease rather than emphysema with restrictive PFTs and decreased DLCO => no ILD on CT, emphysema noted along with lung nodules. Most recent repeat CT in 11/19 showed stable nodules, do not need repeat. He continues to smoke 4-5 cigarettes/day.  - Use Chantix. No change.  3. OSA: Continue CPAP. No change.  4. PAD: Significant disease on peripheral dopplers, stable mild claudication. He saw Dr. Gwenlyn Found, medical management recommended.  Encouraged him to try to walk at least a short distance through the pain.  - Continue ASA 81 and statin. Good lipids in 1/19.   - Repeat  ABIs due and will need followup with Dr. Gwenlyn Found.  5. Hypotension - Resolved, and back on medications without further issue.   No change today. BMET with recent hypotension and med changes. He is overall doing much better, with minimal changes to medication. It is unclear what brought on his hypotension last visit. Will have paramedicine check BP again later this week.   Shirley Friar, PA-C  08/31/2018   Greater than 50% of the 25 minute visit was spent in counseling/coordination of care regarding disease state education, salt/fluid restriction, sliding scale diuretics, and medication compliance.

## 2018-08-31 NOTE — Patient Instructions (Signed)
Labs done today. We will contact you for any abnormal lab values.  Hold furosemide tonight's dose only.  Keep up coming appointment.

## 2018-09-09 ENCOUNTER — Telehealth (HOSPITAL_COMMUNITY): Payer: Self-pay

## 2018-09-10 ENCOUNTER — Ambulatory Visit (INDEPENDENT_AMBULATORY_CARE_PROVIDER_SITE_OTHER): Payer: 59

## 2018-09-10 DIAGNOSIS — I428 Other cardiomyopathies: Secondary | ICD-10-CM | POA: Diagnosis not present

## 2018-09-11 ENCOUNTER — Ambulatory Visit (INDEPENDENT_AMBULATORY_CARE_PROVIDER_SITE_OTHER): Payer: 59

## 2018-09-11 DIAGNOSIS — Z95810 Presence of automatic (implantable) cardiac defibrillator: Secondary | ICD-10-CM

## 2018-09-11 DIAGNOSIS — I5042 Chronic combined systolic (congestive) and diastolic (congestive) heart failure: Secondary | ICD-10-CM | POA: Diagnosis not present

## 2018-09-11 LAB — CUP PACEART REMOTE DEVICE CHECK
Battery Remaining Longevity: 112 mo
Battery Voltage: 3.01 V
Brady Statistic RV Percent Paced: 0 %
Date Time Interrogation Session: 20200130062304
HighPow Impedance: 66 Ohm
Implantable Lead Implant Date: 20160725
Implantable Lead Location: 753860
Implantable Lead Model: 293
Implantable Pulse Generator Implant Date: 20160725
Lead Channel Impedance Value: 380 Ohm
Lead Channel Pacing Threshold Amplitude: 0.875 V
Lead Channel Pacing Threshold Pulse Width: 0.4 ms
Lead Channel Sensing Intrinsic Amplitude: 11.375 mV
Lead Channel Sensing Intrinsic Amplitude: 11.375 mV
Lead Channel Setting Pacing Amplitude: 2.5 V
Lead Channel Setting Pacing Pulse Width: 0.4 ms
Lead Channel Setting Sensing Sensitivity: 0.3 mV
MDC IDC LEAD SERIAL: 383593
MDC IDC MSMT LEADCHNL RV IMPEDANCE VALUE: 380 Ohm

## 2018-09-11 NOTE — Telephone Encounter (Signed)
I called Mr David Bright to schedule an appointment. He did not answer so I left a voicemail requesting he call me back.

## 2018-09-11 NOTE — Progress Notes (Signed)
EPIC Encounter for ICM Monitoring  Patient Name: David Bright is a 58 y.o. male Date: 09/11/2018 Primary Care Physican: Patient, No Pcp Per Primary Cardiologist:[facility] Clinic:[facility]  Electrophysiologist:Klein LastWeight:278lbs Today's Weight:unknown                                                          Transmission reviewed   Thoracic impedance normal.        Prescribed: Furosemide40 mg 1 tablettwice a day.Potassium 20 mEq 1 tablet twice a day.   Labs: 08/31/2018 Creatinine 1.56, BUN 29, Potassium 4.3, Sodium 138, eGFR 48-56 08/21/2018 Creatinine 2.10, BUN 45, Potassium 4.9, Sodium 136, eGFR 34-39 08/10/2018 Creatinine 1.59, BUN 31, Potassium 4.1, Sodium 139, eGFR 47-55 07/30/2018 Creatinine 1.46, BUN 30, Potassium 4.0, Sodium 138, eGFR 52->60 06/17/2018 Creatinine1.19, BUN23, Potassium3.3, Sodium139, eGFR>60 05/26/2018 Creatinine1.40, BUN33, Potassium4.3, Sodium137, eGFR54->60  05/08/2018 Creatinine1.90, BUN56, Potassium5.0, [id number], [id number] 03/27/2018 Creatinine 1.80, BUN 48, Potassium 4.6, Sodium 139, EGFR 41-47 03/17/2018 Creatinine 1.87, BUN 52, Potassium 4.7, Sodium 139, EGFR 39-45 01/02/2018 Creatinine 1.21, BUN 33, Potassium 4.6, Sodium 140, EGFR 66-76 Care Everywhere 12/23/2017 Creatinine 1.45, BUN 30, Potassium 5.8, Sodium 138, EGFR 52-60 08/19/2017 Creatinine 1.16, BUN 16, Potassium 4.2, Sodium 137, EGFR >60 A complete set of results can be found in Results Review.  Recommendations:  Left voice mail with ICM number and encouraged to call if experiencing any fluid symptoms.  Follow-up plan: ICM clinic phone appointment on 09/21/2018 to recheck fluid levels.   Office appointment scheduled 10/15/2018 with HF clinic.    Copy of ICM check sent to Dr. Caryl Comes.    3 month ICM trend: 09/10/2018    1 Year ICM trend:       [name], RN 09/11/2018 5:50 PM

## 2018-09-15 ENCOUNTER — Telehealth (HOSPITAL_COMMUNITY): Payer: Self-pay

## 2018-09-15 NOTE — Telephone Encounter (Signed)
I called David Bright to schedule an appointment for this week. He did not answer so I left a voicemail requesting he call me back.

## 2018-09-16 ENCOUNTER — Telehealth (HOSPITAL_COMMUNITY): Payer: Self-pay

## 2018-09-16 NOTE — Telephone Encounter (Signed)
David Bright returned my phone call from yesterday after about schedule an appointment. He stated he was taking someone to the doctor this afternoon and wanted me to come Friday. I advised that I only had one appointment on Friday but he could only meet during the time frame which was already filled. He stated he was not in need of anything right now and would be able to wait until next week. I advised I would call him later to confirm a date and time. He was agreeable.

## 2018-09-18 NOTE — Progress Notes (Signed)
Remote ICD transmission.   

## 2018-09-21 ENCOUNTER — Encounter: Payer: Self-pay | Admitting: Cardiology

## 2018-09-21 ENCOUNTER — Ambulatory Visit (INDEPENDENT_AMBULATORY_CARE_PROVIDER_SITE_OTHER): Payer: 59

## 2018-09-21 DIAGNOSIS — Z95810 Presence of automatic (implantable) cardiac defibrillator: Secondary | ICD-10-CM

## 2018-09-21 DIAGNOSIS — I5042 Chronic combined systolic (congestive) and diastolic (congestive) heart failure: Secondary | ICD-10-CM

## 2018-09-22 NOTE — Progress Notes (Addendum)
EPIC Encounter for ICM Monitoring  Patient Name: David Bright is a 58 y.o. male Date: 09/22/2018 Primary Care Physican: Patient, No Pcp Per Primary Cardiologist:David Bright Heart Failure Clinic:David Bright  Electrophysiologist:David Bright LastWeight:278lbs Today's Weight:274 lbs  Heart failure questions reviewed and denied any fluid symptoms.  He has been missing Furosemide dosages and eating in restaurants more in the last couple of weeks.   Thoracic impedance abnormal suggesting fluid accumulation since 09/08/2018.  Prescribed: Furosemide40 mg 1tablettwice a day.Potassium 20 mEq 1 tablet twice a day.   Labs: 08/31/2018 Creatinine 1.56, BUN 29, Potassium 4.3, Sodium 138, eGFR 48-56 08/21/2018 Creatinine 2.10, BUN 45, Potassium 4.9, Sodium 136, eGFR 34-39 08/10/2018 Creatinine 1.59, BUN 31, Potassium 4.1, Sodium 139, eGFR 47-55 07/30/2018 Creatinine 1.46, BUN 30, Potassium 4.0, Sodium 138, eGFR 52->60 06/17/2018 Creatinine1.19, BUN23, Potassium3.3, Sodium139, eGFR>60 05/26/2018 Creatinine1.40, BUN33, Potassium4.3, Sodium137, eGFR54->60  05/08/2018 Creatinine1.90, BUN56, Potassium5.0, [id number], [id number] 03/27/2018 Creatinine 1.80, BUN 48, Potassium 4.6, Sodium 139, EGFR 41-47 03/17/2018 Creatinine 1.87, BUN 52, Potassium 4.7, Sodium 139, EGFR 39-45 01/02/2018 Creatinine 1.21, BUN 33, Potassium 4.6, Sodium 140, EGFR 66-76 Care Everywhere 12/23/2017 Creatinine 1.45, BUN 30, Potassium 5.8, Sodium 138, EGFR 52-60 08/19/2017 Creatinine 1.16, BUN 16, Potassium 4.2, Sodium 137, EGFR >60 A complete set of results can be found in Results Review.  Recommendations: Advised importance of taking meds as prescribed which helps with preventing fluid accumulation.  Advised to try to eat at home more often to control the salt intake and to read food labels.  Follow-up plan: ICM clinic phone appointment on2/21/2020 to recheck fluid levels.Office appointment  scheduled 10/15/2018 with HF clinic.   Copy of ICM check sent to Dr.Klein and Dr David Bright for review and recommendations if needed.  3 month ICM trend: 09/21/2018    1 Year ICM trend:       David Billings, RN 09/22/2018 10:30 AM

## 2018-09-23 ENCOUNTER — Other Ambulatory Visit (HOSPITAL_COMMUNITY): Payer: Self-pay

## 2018-09-23 NOTE — Progress Notes (Signed)
Paramedicine Encounter    Patient ID: David Bright, male    DOB: Jan 31, 1961, 58 y.o.   MRN: [id number]   Patient Care Team: Patient, No Pcp Per as PCP - General (General Practice) David Spark, MD as PCP - Cardiology (Cardiology) David Bright, David Conception, MD (Inactive) (Cardiology) David Bright as Physician Assistant (Physician Assistant)  Patient Active Problem List   Diagnosis Date Noted  . Peripheral arterial disease ([place]) 10/29/2016  . BMI 36.0-36.9,adult 09/25/2016  . Vitamin D deficiency 09/18/2016  . Polysubstance abuse ([place]) 07/18/2016  . AKI (acute kidney injury) ([place]) 05/13/2016  . Bilateral ocular hypertension 01/08/2016  . Nuclear sclerotic cataract of both eyes 01/08/2016  . Posterior subcapsular polar age-related cataract of left eye 01/08/2016  . Presbyopia 01/08/2016  . Regular astigmatism of both eyes 01/08/2016  . Nicotine dependence 09/05/2015  . Upper respiratory infection, viral 09/05/2015  . Acute on chronic systolic and diastolic heart failure, NYHA class 3 ([place]) 08/09/2015  . S/P ICD (internal cardiac defibrillator) procedure 08/09/2015  . Acute on chronic combined systolic and diastolic congestive heart failure ([place]) 08/09/2015  . History of biliary T-tube placement 08/09/2015  . Hypertension 07/04/2015  . Obstructive sleep apnea syndrome 06/04/2015  . Type 2 diabetes mellitus ([place]) 05/16/2015  . Type 2 diabetes mellitus without complication ([place]) 28/31/5176  . Palpitations 01/26/2015  . SOB (shortness of breath) 10/20/2014  . DOE (dyspnea on exertion) 10/20/2014  . Abnormal EKG 10/20/2014  . Dyspnea on exertion 10/20/2014  . Chronic combined systolic and diastolic CHF (congestive heart failure) ([place]) 10/19/2013  . NICM (nonischemic cardiomyopathy) ([place]) 11/09/2011  . OSA on CPAP 11/09/2011  . HTN (hypertension) 11/09/2011  . Hyperlipidemia 11/09/2011  . Chronic obstructive pulmonary disease ([place]) 11/09/2011  . DM (diabetes mellitus) ([place])  11/09/2011  . Acute respiratory failure ([place]) 11/07/2011    Current Outpatient Medications:  .  aspirin EC 81 MG tablet, Take 1 tablet (81 mg total) by mouth daily., Disp: 30 tablet, Rfl: 0 .  atorvastatin (LIPITOR) 20 MG tablet, Take 1 tablet (20 mg total) by mouth daily., Disp: 30 tablet, Rfl: 3 .  canagliflozin (INVOKANA) 300 MG TABS tablet, Take 300 mg by mouth daily before breakfast., Disp: , Rfl:  .  furosemide (LASIX) 40 MG tablet, Take 40 mg by mouth 2 (two) times daily., Disp: , Rfl:  .  isosorbide-hydrALAZINE (BIDIL) 20-37.5 MG tablet, Take 1 tablet by mouth 3 (three) times daily., Disp: , Rfl:  .  ivabradine (CORLANOR) 5 MG TABS tablet, Take 5 mg by mouth 2 (two) times daily with a meal., Disp: , Rfl:  .  potassium chloride SA (K-DUR,KLOR-CON) 20 MEQ tablet, Take 1 tablet (20 mEq total) by mouth 2 (two) times daily., Disp: 180 tablet, Rfl: 3 .  sacubitril-valsartan (ENTRESTO) 97-103 MG, Take 1 tablet by mouth 2 (two) times daily., Disp: , Rfl:  .  sitaGLIPtin-metformin (JANUMET) 50-1000 MG tablet, Take 1 tablet by mouth 2 (two) times daily with a meal., Disp: , Rfl:  .  spironolactone (ALDACTONE) 25 MG tablet, Take 25 mg by mouth daily., Disp: , Rfl:  .  carvedilol (COREG) 6.25 MG tablet, Take 1 tablet (6.25 mg total) by mouth 2 (two) times daily with a meal., Disp: , Rfl:  .  mesalamine (LIALDA) 1.2 g EC tablet, Take 1.2 g 2 (two) times daily by mouth., Disp: , Rfl:  No Known Allergies    Social History   Socioeconomic History  . Marital status: Single  Spouse name: Not on file  . Number of children: Not on file  . Years of education: Not on file  . Highest education level: Not on file  Occupational History  . 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 Financial resource strain: Not very hard  . Food insecurity:    Worry: Never true    Inability: Never true  . Transportation needs:    Medical: No    Non-medical: No   Tobacco Use  . Smoking status: Current Every Day Smoker    Packs/day: 1.00    Years: 33.00    Pack years: 33.00    Types: Cigarettes  . Smokeless tobacco: Never Used  Substance and Sexual Activity  . Alcohol use: No    Comment: QUIT IN 2000  . Drug use: No    Comment: quit 12 years ago  . Sexual activity: Never  Lifestyle  . Physical activity:    Days per week: Not on file    Minutes per session: Not on file  . Stress: Not on file  Relationships  . Social connections:    Talks on phone: Not on file    Gets together: Not on file    Attends religious service: Not on file    Active member of club or organization: Not on file    Attends meetings of clubs or organizations: Not on file    Relationship status: Not on file  . Intimate partner violence:    Fear of current or ex partner: Not on file    Emotionally abused: Not on file    Physically abused: Not on file    Forced sexual activity: Not on file  Other Topics Concern  . Not on file  Social History Narrative   Lives in [place], [place] with housemate.     Physical Exam Cardiovascular:     Rate and Rhythm: Normal rate and regular rhythm.  Pulmonary:     Effort: Pulmonary effort is normal.     Breath sounds: Wheezing present.  Musculoskeletal: Normal range of motion.     Right lower leg: No edema.     Left lower leg: No edema.  Skin:    General: Skin is warm and dry.     Capillary Refill: Capillary refill takes less than 2 seconds.  Neurological:     Mental Status: He is alert and oriented to person, place, and time.  Psychiatric:        Mood and Affect: Mood normal.         Future Appointments  Date Time Provider [place]  09/29/2018  8:50 AM [facility] DEVICE REMOTES [facility]  10/15/2018 10:30 AM [facility] PA/NP [facility] None  12/10/2018  7:50 AM [facility] DEVICE REMOTES [facility]    BP 112/72 (BP Location: Left Arm, Patient Position: Sitting, Cuff Size: Large)    Pulse 72   Resp 16   Wt 272 lb 9.6 oz (123.7 kg)   SpO2 96%   BMI 36.97 kg/m   Weight yesterday- Did not weigh Last visit weight- 273 lb  Mr David Bright was seen at home today and reported feeling well. He denied chest pain, SOB, headache, dizziness or orthopnea. He stated he has been compliant with his medications however upon reviewing them I noted that he has been taking 12.5 mg of carvedilol BID rather than the 6.25 mg BID as directed after his ED discharge several weeks ago. He stated he has  not felt bad at any point however he stated he had not taken any medication today and his pressure was 112/72 mmHg. I advised that he should begin taking the carvedilol as directed and he was understanding and agreeable. I will continue seeing him until his next appointment at the HF clinic in March to ensure he does well with the change. He is not weighing daily so we spoke about this again and he said he would try to remember moving forward.   Jacquiline Doe, EMT 09/23/18  ACTION: Home visit completed Next visit planned for 1 week

## 2018-09-27 NOTE — Progress Notes (Signed)
Take Lasix 40 mg bid x 2 days then 40 mg daily after that.

## 2018-09-28 ENCOUNTER — Telehealth (HOSPITAL_COMMUNITY): Payer: Self-pay | Admitting: Cardiology

## 2018-09-28 NOTE — Telephone Encounter (Signed)
-----   Message from Larey Dresser, MD sent at 09/27/2018  9:46 PM EST -----   ----- Message ----- From: [name], RN Sent: 09/22/2018   3:29 PM EST To: Larey Dresser, MD

## 2018-09-28 NOTE — Telephone Encounter (Signed)
Detailed instructions left on voicemail with increase of lasix x 2 days, message sent to [facility] with paramedicine as well

## 2018-09-28 NOTE — Progress Notes (Signed)
Patient notified of Dr Claris Gladden recommendations by Moshe Cipro, CMA heart failure clinic.

## 2018-09-30 ENCOUNTER — Other Ambulatory Visit (HOSPITAL_COMMUNITY): Payer: Self-pay | Admitting: Cardiology

## 2018-09-30 ENCOUNTER — Other Ambulatory Visit (HOSPITAL_COMMUNITY): Payer: Self-pay

## 2018-09-30 NOTE — Progress Notes (Signed)
Paramedicine Encounter    Patient ID: David Bright, male    DOB: 1961-05-29, 58 y.o.   MRN: [id number]   Patient Care Team: Patient, No Pcp Per as PCP - General (General Practice) Dorothy Spark, MD as PCP - Cardiology (Cardiology) Verl Blalock, Marijo Conception, MD (Inactive) (Cardiology) Sharmon Revere as Physician Assistant (Physician Assistant)  Patient Active Problem List   Diagnosis Date Noted  . Peripheral arterial disease ([place]) 10/29/2016  . BMI 36.0-36.9,adult 09/25/2016  . Vitamin D deficiency 09/18/2016  . Polysubstance abuse ([place]) 07/18/2016  . AKI (acute kidney injury) ([place]) 05/13/2016  . Bilateral ocular hypertension 01/08/2016  . Nuclear sclerotic cataract of both eyes 01/08/2016  . Posterior subcapsular polar age-related cataract of left eye 01/08/2016  . Presbyopia 01/08/2016  . Regular astigmatism of both eyes 01/08/2016  . Nicotine dependence 09/05/2015  . Upper respiratory infection, viral 09/05/2015  . Acute on chronic systolic and diastolic heart failure, NYHA class 3 ([place]) 08/09/2015  . S/P ICD (internal cardiac defibrillator) procedure 08/09/2015  . Acute on chronic combined systolic and diastolic congestive heart failure ([place]) 08/09/2015  . History of biliary T-tube placement 08/09/2015  . Hypertension 07/04/2015  . Obstructive sleep apnea syndrome 06/04/2015  . Type 2 diabetes mellitus ([place]) 05/16/2015  . Type 2 diabetes mellitus without complication ([place]) 70/96/2836  . Palpitations 01/26/2015  . SOB (shortness of breath) 10/20/2014  . DOE (dyspnea on exertion) 10/20/2014  . Abnormal EKG 10/20/2014  . Dyspnea on exertion 10/20/2014  . Chronic combined systolic and diastolic CHF (congestive heart failure) ([place]) 10/19/2013  . NICM (nonischemic cardiomyopathy) ([place]) 11/09/2011  . OSA on CPAP 11/09/2011  . HTN (hypertension) 11/09/2011  . Hyperlipidemia 11/09/2011  . Chronic obstructive pulmonary disease ([place]) 11/09/2011  . DM (diabetes mellitus) ([place])  11/09/2011  . Acute respiratory failure ([place]) 11/07/2011    Current Outpatient Medications:  .  aspirin EC 81 MG tablet, Take 1 tablet (81 mg total) by mouth daily., Disp: 30 tablet, Rfl: 0 .  atorvastatin (LIPITOR) 20 MG tablet, Take 1 tablet (20 mg total) by mouth daily., Disp: 30 tablet, Rfl: 3 .  canagliflozin (INVOKANA) 300 MG TABS tablet, Take 300 mg by mouth daily before breakfast., Disp: , Rfl:  .  carvedilol (COREG) 6.25 MG tablet, Take 1 tablet (6.25 mg total) by mouth 2 (two) times daily with a meal., Disp: , Rfl:  .  furosemide (LASIX) 40 MG tablet, Take 40 mg by mouth 2 (two) times daily., Disp: , Rfl:  .  isosorbide-hydrALAZINE (BIDIL) 20-37.5 MG tablet, Take 1 tablet by mouth 3 (three) times daily., Disp: , Rfl:  .  ivabradine (CORLANOR) 5 MG TABS tablet, Take 5 mg by mouth 2 (two) times daily with a meal., Disp: , Rfl:  .  potassium chloride SA (K-DUR,KLOR-CON) 20 MEQ tablet, Take 1 tablet (20 mEq total) by mouth 2 (two) times daily., Disp: 180 tablet, Rfl: 3 .  sacubitril-valsartan (ENTRESTO) 97-103 MG, Take 1 tablet by mouth 2 (two) times daily., Disp: , Rfl:  .  sitaGLIPtin-metformin (JANUMET) 50-1000 MG tablet, Take 1 tablet by mouth 2 (two) times daily with a meal., Disp: , Rfl:  .  spironolactone (ALDACTONE) 25 MG tablet, Take 25 mg by mouth daily., Disp: , Rfl:  .  mesalamine (LIALDA) 1.2 g EC tablet, Take 1.2 g 2 (two) times daily by mouth., Disp: , Rfl:  No Known Allergies    Social History   Socioeconomic History  . Marital status: Single  Spouse name: Not on file  . Number of children: Not on file  . Years of education: Not on file  . Highest education level: Not on file  Occupational History  . 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 Financial resource strain: Not very hard  . Food insecurity:    Worry: Never true    Inability: Never true  . Transportation needs:    Medical: No    Non-medical: No   Tobacco Use  . Smoking status: Current Every Day Smoker    Packs/day: 1.00    Years: 33.00    Pack years: 33.00    Types: Cigarettes  . Smokeless tobacco: Never Used  Substance and Sexual Activity  . Alcohol use: No    Comment: QUIT IN 2000  . Drug use: No    Comment: quit 12 years ago  . Sexual activity: Never  Lifestyle  . Physical activity:    Days per week: Not on file    Minutes per session: Not on file  . Stress: Not on file  Relationships  . Social connections:    Talks on phone: Not on file    Gets together: Not on file    Attends religious service: Not on file    Active member of club or organization: Not on file    Attends meetings of clubs or organizations: Not on file    Relationship status: Not on file  . Intimate partner violence:    Fear of current or ex partner: Not on file    Emotionally abused: Not on file    Physically abused: Not on file    Forced sexual activity: Not on file  Other Topics Concern  . Not on file  Social History Narrative   Lives in [place], [place] with housemate.     Physical Exam HENT:     Left Ear: Ear canal normal.  Cardiovascular:     Rate and Rhythm: Normal rate and regular rhythm.     Pulses: Normal pulses.  Pulmonary:     Effort: Pulmonary effort is normal.     Breath sounds: Normal breath sounds.  Musculoskeletal: Normal range of motion.     Right lower leg: No edema.     Left lower leg: No edema.  Skin:    General: Skin is warm and dry.     Capillary Refill: Capillary refill takes less than 2 seconds.  Neurological:     Mental Status: He is alert and oriented to person, place, and time.  Psychiatric:        Mood and Affect: Mood normal.         Future Appointments  Date Time Provider [place]  10/02/2018  7:35 AM [facility] DEVICE REMOTES [facility]  10/15/2018 10:30 AM [facility] PA/NP [facility] None  12/10/2018  7:50 AM [facility] DEVICE REMOTES [facility]    BP (!)  90/58 (BP Location: Left Arm, Patient Position: Sitting, Cuff Size: Large)   Pulse 82   Resp 16   Wt 276 lb (125.2 kg)   SpO2 97%   BMI 37.43 kg/m   Weight yesterday- Can't remember Last visit weight- 272 lb  Mr Charlett Blake was seen at home today and reported feeling well. He denied chest pain, SOB, headache, dizziness or orthopnea. He reported being compliant with his medications over the past week however he has only been taking Bidil BID rather that the TID as prescribed. He stated  this is because he tends to forget the afternoon dose. I checked his pillbox and he did not have it in the afternoon slot so I added it and stressed the importance of him taking his medications as directed. He expressed understanding and was agreeable. His weight is up 4 lb since last week so I encouraged him to be careful with his fluid intake and his salt consumption. His medications were verified and his pillbox was refilled. All necessary refills were ordered and will be ready by the end of the week. He stated he would be able to pick them up when they are ready.  Jacquiline Doe, EMT 09/30/18  ACTION: Home visit completed Next visit planned for 1 week

## 2018-10-02 ENCOUNTER — Ambulatory Visit (INDEPENDENT_AMBULATORY_CARE_PROVIDER_SITE_OTHER): Payer: 59

## 2018-10-02 DIAGNOSIS — Z95810 Presence of automatic (implantable) cardiac defibrillator: Secondary | ICD-10-CM

## 2018-10-02 DIAGNOSIS — I5042 Chronic combined systolic (congestive) and diastolic (congestive) heart failure: Secondary | ICD-10-CM

## 2018-10-02 NOTE — Progress Notes (Signed)
EPIC Encounter for ICM Monitoring  Patient Name: David Bright is a 58 y.o. male Date: 10/02/2018 Primary Care Physican: Patient, No Pcp Per Primary Cardiologist:Nelson/[place]  Electrophysiologist:Klein LastWeight:274lbs Today's Weight:274 lbs  Heart failure questions reviewed and denied any fluid symptoms.  He has been compliant with taking Furosemide    Thoracic impedance returned close to baseline normal.  Prescribed: Furosemide40 mg 1tablet a day.Potassium 20 mEq 1 tablet twice a day.   Labs: 08/31/2018 Creatinine 1.56, BUN 29, Potassium 4.3, Sodium 138, eGFR 48-56 08/21/2018 Creatinine 2.10, BUN 45, Potassium 4.9, Sodium 136, eGFR 34-39 08/10/2018 Creatinine 1.59, BUN 31, Potassium 4.1, Sodium 139, eGFR 47-55 07/30/2018 Creatinine 1.46, BUN 30, Potassium 4.0, Sodium 138, eGFR 52->60 06/17/2018 Creatinine1.19, BUN23, Potassium3.3, Sodium139, eGFR>60 05/26/2018 Creatinine1.40, BUN33, Potassium4.3, Sodium137, eGFR54->60  05/08/2018 Creatinine1.90, BUN56, Potassium5.0, [id number], ZESP23-30 03/27/2018 Creatinine 1.80, BUN 48, Potassium 4.6, Sodium 139, EGFR 41-47 03/17/2018 Creatinine 1.87, BUN 52, Potassium 4.7, Sodium 139, EGFR 39-45 01/02/2018 Creatinine 1.21, BUN 33, Potassium 4.6, Sodium 140, EGFR 66-76 Care Everywhere 12/23/2017 Creatinine 1.45, BUN 30, Potassium 5.8, Sodium 138, EGFR 52-60 08/19/2017 Creatinine 1.16, BUN 16, Potassium 4.2, Sodium 137, EGFR >60 A complete set of results can be found in Results Review.  Recommendations: No changes and encouraged to call for fluid symptoms.   Follow-up plan: ICM clinic phone appointment on3/04/2019.Office appointment scheduled3/12/2018 withHF clinic.   Copy of ICM check sent to [facility].  3 month ICM trend: 10/02/2018    1 Year ICM trend:       [name], RN 10/02/2018 10:28 AM

## 2018-10-07 ENCOUNTER — Encounter (HOSPITAL_COMMUNITY): Payer: Self-pay

## 2018-10-13 ENCOUNTER — Telehealth (HOSPITAL_COMMUNITY): Payer: Self-pay

## 2018-10-13 NOTE — Telephone Encounter (Signed)
Prior authorization through [employer] was initiated for Corlanor 5 mg medication and sent via CMM on 10/13/2018.

## 2018-10-15 ENCOUNTER — Encounter (HOSPITAL_COMMUNITY): Payer: Self-pay

## 2018-10-15 ENCOUNTER — Ambulatory Visit (HOSPITAL_COMMUNITY)
Admission: RE | Admit: 2018-10-15 | Discharge: 2018-10-15 | Disposition: A | Discharge status: Home / Self Care | Payer: 59 | Source: Ambulatory Visit | Attending: Internal Medicine | Admitting: Internal Medicine

## 2018-10-15 ENCOUNTER — Other Ambulatory Visit (HOSPITAL_COMMUNITY): Payer: Self-pay

## 2018-10-15 ENCOUNTER — Other Ambulatory Visit: Payer: Self-pay | Admitting: Internal Medicine

## 2018-10-15 VITALS — BP 94/66 | HR 91 | Wt 277.0 lb

## 2018-10-15 DIAGNOSIS — G4733 Obstructive sleep apnea (adult) (pediatric): Secondary | ICD-10-CM | POA: Diagnosis not present

## 2018-10-15 DIAGNOSIS — E785 Hyperlipidemia, unspecified: Secondary | ICD-10-CM | POA: Insufficient documentation

## 2018-10-15 DIAGNOSIS — E1151 Type 2 diabetes mellitus with diabetic peripheral angiopathy without gangrene: Secondary | ICD-10-CM | POA: Diagnosis not present

## 2018-10-15 DIAGNOSIS — I10 Essential (primary) hypertension: Secondary | ICD-10-CM

## 2018-10-15 DIAGNOSIS — Z7982 Long term (current) use of aspirin: Secondary | ICD-10-CM | POA: Insufficient documentation

## 2018-10-15 DIAGNOSIS — I5022 Chronic systolic (congestive) heart failure: Secondary | ICD-10-CM | POA: Diagnosis present

## 2018-10-15 DIAGNOSIS — F17210 Nicotine dependence, cigarettes, uncomplicated: Secondary | ICD-10-CM | POA: Insufficient documentation

## 2018-10-15 DIAGNOSIS — I5042 Chronic combined systolic (congestive) and diastolic (congestive) heart failure: Secondary | ICD-10-CM | POA: Diagnosis not present

## 2018-10-15 DIAGNOSIS — Z720 Tobacco use: Secondary | ICD-10-CM | POA: Diagnosis not present

## 2018-10-15 DIAGNOSIS — Z7984 Long term (current) use of oral hypoglycemic drugs: Secondary | ICD-10-CM | POA: Diagnosis not present

## 2018-10-15 DIAGNOSIS — Z9989 Dependence on other enabling machines and devices: Secondary | ICD-10-CM

## 2018-10-15 DIAGNOSIS — I428 Other cardiomyopathies: Secondary | ICD-10-CM

## 2018-10-15 DIAGNOSIS — E1142 Type 2 diabetes mellitus with diabetic polyneuropathy: Secondary | ICD-10-CM | POA: Diagnosis not present

## 2018-10-15 DIAGNOSIS — I110 Hypertensive heart disease with heart failure: Secondary | ICD-10-CM | POA: Diagnosis not present

## 2018-10-15 DIAGNOSIS — Z79899 Other long term (current) drug therapy: Secondary | ICD-10-CM | POA: Insufficient documentation

## 2018-10-15 DIAGNOSIS — Z7901 Long term (current) use of anticoagulants: Secondary | ICD-10-CM | POA: Insufficient documentation

## 2018-10-15 DIAGNOSIS — Z95810 Presence of automatic (implantable) cardiac defibrillator: Secondary | ICD-10-CM

## 2018-10-15 DIAGNOSIS — J449 Chronic obstructive pulmonary disease, unspecified: Secondary | ICD-10-CM | POA: Diagnosis not present

## 2018-10-15 LAB — BASIC METABOLIC PANEL
Anion gap: 11 (ref 5–15)
BUN: 18 mg/dL (ref 6–20)
CO2: 24 mmol/L (ref 22–32)
Calcium: 9 mg/dL (ref 8.9–10.3)
Chloride: 104 mmol/L (ref 98–111)
Creatinine, Ser: 1.26 mg/dL — ABNORMAL HIGH (ref 0.61–1.24)
GFR calc Af Amer: >60 mL/min (ref >60)
GFR calc non Af Amer: >60 mL/min (ref >60)
Glucose, Bld: 163 mg/dL — ABNORMAL HIGH (ref 70–99)
Potassium: 4.2 mmol/L (ref 3.5–5.1)
Sodium: 139 mmol/L (ref 135–145)

## 2018-10-15 MED ORDER — CARVEDILOL 6.25 MG PO TABS: 6.2500 mg | ORAL_TABLET | Freq: Two times a day (BID) | ORAL | Status: DC

## 2018-10-15 MED ORDER — SPIRONOLACTONE 25 MG PO TABS: 25.0000 mg | ORAL_TABLET | Freq: Every day | ORAL | 3 refills | Status: DC

## 2018-10-15 NOTE — Progress Notes (Signed)
Appeal for Corlanor 5mg  sent to [facility] to [phone]. Original to be scanned into chart

## 2018-10-15 NOTE — Patient Instructions (Addendum)
Lab work done today. We will contact you for any abnormal lab work.  REFILLED Carvedilol 6.25mg  (1 tab) twice daily. Also refilled Spironolactone.  Please follow up with Dr. Aundra Dubin in 3-4 months

## 2018-10-15 NOTE — Progress Notes (Signed)
Paramedicine Encounter   Patient ID: David Bright , male,   DOB: [birth year],57 y.o.,  MRN: [id number]  David Bright was seen in the clinic with David Kilts, PA-C. Her reported feeling generally well and per David Bright, no medications changes were necessary. I spoke with him about being referred to the food delivery program and he was willing. He continues to do well and I believe he is ready for graduation from the paramedicine program. We spoke about this and he was agreeable. I will pass this on to David Sours, LCSW, for her to remove him from the list.   David Bright, EMT 10/15/2018   ACTION: Next visit planned for Discharged

## 2018-10-15 NOTE — Progress Notes (Signed)
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 male with history of chronic systolic CHF and COPD/active smoking presents for followup of CHF.  He has a known nonischemic cardiomyopathy, echo in 1/17 with EF 20% and a severely dilated LV.  No coronary disease on 7/16 cardiac cath.  Repeat echo was 2/18, showing EF up to 40%.   He is trying stop smoking with Chantix, down to 2-3 cigs/day. Still with leg fatigue/cramping after walking 100 yards.  He had significant PAD by peripheral arterial dopplers, saw Dr. Gwenlyn Found with plan of medical management for now. He also has knee pain which is limiting.   Echo 09/2017: EF up to 50-55% with mild LVH.   Echo 08/21/2018 EF up to 55% with normal RV.  He presents today for regular follow up. He is feeling well overall. BP has been soft, but hasn't had any lightheadedness or dizziness. Denies SOB or peripheral edema. He has been taking all medication as directed, with assistance from paramedicine. He denies CP, orthopnea, PND, or palpitations. Not very active. Not doing anything specific to try and lose weight.   ICD: Did not result.   Labs (12/17): K 4.3, creatinine 1.09 Labs (1/18): K 3.9, creatinine 1.13 Labs (3/18): K 4.3, creatinine 1.48, NT-proBNP 61 Labs (4/18): K 4.5, creatinine 1.23, LDL 71, BNP 34 Labs (5/18): K 4.5, creatinine 1.2 Labs (1/19): K 4.2, creatinine 1.19, LDL 69, HDL 39 Labs (11/19): LDL 66, HDL 34, K 3.3, creatinine 1.19, BNP 45 Labs (12/19): K 4.4, creatinine 1.59  PMH:  1. Chronic systolic CHF: Nonischemic cardiomyopathy.  Has Medtronic ICD.   - LHC (7/16) with nonobstructive disease.  - Echo (1/17) with EF 20%, severe LV dilation, mild to moderate MR.  - Echo (2/18): EF 40%, mild LV dilation.  - Echo (5/19): EF 50-55%, mild LVH - Echo (1/20): EF 55-60%, mild LVH, normal RV size and systolic function.  2. COPD: Active smoker.  - PFTs (2/18) with minimal obstruction, mild restriction suggesting a  parenchymal process, low DLCO.  - CT chest (6/18): No ILD, +emphysema, lung nodules.  - CT chest (11/19): Emphysema, no ILD, stable nodules 3. Type II diabetes with peripheral neuropathy.  4. OSA on CPAP 5. HTN 6. Hyperlipidemia 7. PAD: ABIs abnormal at outside office.  - Peripheral arterial dopplers (3/18) with > 50% left CIA and bilateral EIA stenosis, 50-74% mid LSFA stenosis.  - ABIs (2/19): 0.85 right, 0.86 left (mild).   SH: Lives in [place], works at [employer] and [employer], smokes about 4-5 cigs/day now.  Rare ETOH.  Remote substance abuse.   FH: Aunt with CHF, brother with ESRD.    Review of systems complete and found to be negative unless listed in HPI.    Current Outpatient Medications  Medication Sig Dispense Refill  . aspirin EC 81 MG tablet Take 1 tablet (81 mg total) by mouth daily. 30 tablet 0  . atorvastatin (LIPITOR) 20 MG tablet Take 1 tablet (20 mg total) by mouth daily. 30 tablet 3  . canagliflozin (INVOKANA) 300 MG TABS tablet Take 300 mg by mouth daily before breakfast.    . carvedilol (COREG) 6.25 MG tablet Take 1 tablet (6.25 mg total) by mouth 2 (two) times daily with a meal.    . ENTRESTO 97-103 MG TAKE 1 TABLET BY MOUTH TWICE DAILY 60 tablet 0  . furosemide (LASIX) 40 MG tablet Take 40 mg by mouth 2 (two) times daily.    Marland Kitchen  isosorbide-hydrALAZINE (BIDIL) 20-37.5 MG tablet Take 1 tablet by mouth 3 (three) times daily.    . ivabradine (CORLANOR) 5 MG TABS tablet Take 5 mg by mouth 2 (two) times daily with a meal.    . potassium chloride SA (K-DUR,KLOR-CON) 20 MEQ tablet Take 1 tablet (20 mEq total) by mouth 2 (two) times daily. 180 tablet 3  . sitaGLIPtin-metformin (JANUMET) 50-1000 MG tablet Take 1 tablet by mouth 2 (two) times daily with a meal.    . spironolactone (ALDACTONE) 25 MG tablet Take 25 mg by mouth daily.    . mesalamine (LIALDA) 1.2 g EC tablet Take 1.2 g 2 (two) times daily by mouth.     No current facility-administered medications for this  encounter.    Vitals:   10/15/18 1015  BP: 94/66  Pulse: 91  SpO2: 90%  Weight: 125.6 kg (277 lb)     Wt Readings from Last 3 Encounters:  10/15/18 125.6 kg (277 lb)  09/30/18 125.2 kg (276 lb)  09/23/18 123.7 kg (272 lb 9.6 oz)    General: Well appearing. No resp difficulty. HEENT: Normal Neck: Supple. JVP 5-6. Carotids 2+ bilat; no bruits. No thyromegaly or nodule noted. Cor: PMI nondisplaced. RRR, No M/G/R noted Lungs: CTAB, normal effort. Abdomen: Soft, non-tender, non-distended, no HSM. No bruits or masses. +BS  Extremities: No cyanosis, clubbing, or rash. R and LLE no edema.  Neuro: Alert & orientedx3, cranial nerves grossly intact. moves all 4 extremities w/o difficulty. Affect pleasant   Assessment/Plan: 1. Chronic systolic CHF: Nonischemic cardiomyopathy => ?due to viral myocarditis or prior substance abuse.  Medtronic ICD.  Echo 1/17 with severely dilated LV and EF 20%, EF up to 40% on echo 2/18.  Echo 08/21/18 EF 55% with normal RV.  Volume status looks stable on exam.   - Continue lasix 40 mg BID - Continue Entresto 97/103 mg BID - Continue spironolactone 25 mg daily - Continue Bidil 1 tab TID.  - Continue coreg 6.25 mg BID.  - Reinforced fluid restriction to < 2 L daily, sodium restriction to less than 2000 mg daily, and the importance of daily weights.   2. Smoking/suspect COPD:  PFTs done, somewhat concerning for interstitial lung disease rather than emphysema with restrictive PFTs and decreased DLCO => no ILD on CT, emphysema noted along with lung nodules. Most recent repeat CT in 11/19 showed stable nodules, do not need repeat. He continues to smoke 4-5 cigarettes/day.  - Use Chantix. No change.  3. OSA:  - Encouraged nightly CPAP.  4. PAD: Significant disease on peripheral dopplers, stable mild claudication. He saw Dr. Gwenlyn Found, medical management recommended.  Encouraged him to try to walk at least a short distance through the pain to build collaterals.  -  Continue ASA 81 and statin. Good lipids in 1/19.   - Repeat ABIs due and will need followup with Dr. Gwenlyn Found.   He is doing very well. Now with normalization of EF discussed importance of continued med compliance. Encouraged weight loss. Paramedicine continues to follow due to ongoing food insecurities. They are working on securing additional resources for him.   Shirley Friar, PA-C  10/15/2018   Greater than 50% of the 25 minute visit was spent in counseling/coordination of care regarding disease state education, salt/fluid restriction, sliding scale diuretics, and medication compliance.

## 2018-10-15 NOTE — Addendum Note (Signed)
Encounter addended by: Marlise Eves, RN on: 10/15/2018 12:35 PM  Actions taken: Clinical Note Signed

## 2018-10-18 ENCOUNTER — Other Ambulatory Visit (HOSPITAL_COMMUNITY): Payer: Self-pay | Admitting: Cardiology

## 2018-10-19 ENCOUNTER — Ambulatory Visit (INDEPENDENT_AMBULATORY_CARE_PROVIDER_SITE_OTHER): Payer: 59

## 2018-10-19 ENCOUNTER — Telehealth: Payer: Self-pay

## 2018-10-19 DIAGNOSIS — Z95810 Presence of automatic (implantable) cardiac defibrillator: Secondary | ICD-10-CM

## 2018-10-19 DIAGNOSIS — I5042 Chronic combined systolic (congestive) and diastolic (congestive) heart failure: Secondary | ICD-10-CM

## 2018-10-19 NOTE — Telephone Encounter (Signed)
Left message for patient to remind of missed remote transmission.  

## 2018-10-20 NOTE — Progress Notes (Signed)
EPIC Encounter for ICM Monitoring  Patient Name: David Bright is a 58 y.o. male Date: 10/20/2018 Primary Care Physican: Patient, No Pcp Per Primary Cardiologist:Nelson/McLean  Electrophysiologist:Klein LastWeight:274lbs Today's Weight:274 lbs  Heart failure questions reviewed and denied any fluid symptoms.  Thoracic impedance normal.  Prescribed: Furosemide40 mg 1tablet a day.Potassium 20 mEq 1 tablet twice a day.   Labs: 10/15/2018 Creatinine 1.26, BUN 18, Potassium 4.2, Sodium 139, GFR >60 08/31/2018 Creatinine 1.56, BUN 29, Potassium 4.3, Sodium 138, GFR 48-56 08/21/2018 Creatinine 2.10, BUN 45, Potassium 4.9, Sodium 136, GFR 34-39 08/10/2018 Creatinine 1.59, BUN 31, Potassium 4.1, Sodium 139, GFR 47-55 07/30/2018 Creatinine 1.46, BUN 30, Potassium 4.0, Sodium 138, GFR 52->60 06/17/2018 Creatinine1.19, BUN23, Potassium3.3, Sodium139, GFR>60 05/26/2018 Creatinine1.40, BUN33, Potassium4.3, Sodium137, GFR54->60  05/08/2018 Creatinine1.90, BUN56, Potassium5.0, [id number], [id number] 03/27/2018 Creatinine 1.80, BUN 48, Potassium 4.6, Sodium 139, GFR 41-47 03/17/2018 Creatinine 1.87, BUN 52, Potassium 4.7, Sodium 139, GFR 39-45 01/02/2018 Creatinine 1.21, BUN 33, Potassium 4.6, Sodium 140, GFR 66-76 Care Everywhere 12/23/2017 Creatinine 1.45, BUN 30, Potassium 5.8, Sodium 138, GFR 52-60 08/19/2017 Creatinine 1.16, BUN 16, Potassium 4.2, Sodium 137, GFR >60 A complete set of results can be found in Results Review.  Recommendations: No changes and encouraged to call for fluid symptoms.   Follow-up plan: ICM clinic phone appointment on4/13/2020  3 month ICM trend: 10/19/2018    1 Year ICM trend:       [name], RN 10/20/2018 5:04 PM

## 2018-10-27 ENCOUNTER — Other Ambulatory Visit (HOSPITAL_COMMUNITY): Payer: Self-pay | Admitting: Cardiology

## 2018-10-27 DIAGNOSIS — I739 Peripheral vascular disease, unspecified: Secondary | ICD-10-CM

## 2018-11-10 ENCOUNTER — Encounter (HOSPITAL_COMMUNITY): Payer: 59

## 2018-11-15 ENCOUNTER — Other Ambulatory Visit (HOSPITAL_COMMUNITY): Payer: Self-pay | Admitting: Cardiology

## 2018-11-23 ENCOUNTER — Other Ambulatory Visit: Payer: Self-pay

## 2018-11-23 ENCOUNTER — Ambulatory Visit (INDEPENDENT_AMBULATORY_CARE_PROVIDER_SITE_OTHER): Payer: Medicare Other

## 2018-11-23 DIAGNOSIS — I5042 Chronic combined systolic (congestive) and diastolic (congestive) heart failure: Secondary | ICD-10-CM

## 2018-11-23 DIAGNOSIS — Z95810 Presence of automatic (implantable) cardiac defibrillator: Secondary | ICD-10-CM | POA: Diagnosis not present

## 2018-11-24 NOTE — Progress Notes (Signed)
EPIC Encounter for ICM Monitoring  Patient Name: David Bright is a 58 y.o. male Date: 11/24/2018 Primary Care Physican: Patient, No Pcp Per Primary Cardiologist:Nelson/McLean  Electrophysiologist:Klein LastWeight:274lbs 11/25/2018 Weight: 275 lbs   Heart failure questions reviewed and pt is asymptomatic.  Thoracic impedancenormal.  Prescribed: Furosemide40 mg 1tablet a day.Potassium 20 mEq 1 tablet twice a day.   Labs: 10/15/2018 Creatinine 1.26, BUN 18, Potassium 4.2, Sodium 139, GFR >60 08/31/2018 Creatinine 1.56, BUN 29, Potassium 4.3, Sodium 138, GFR 48-56 08/21/2018 Creatinine 2.10, BUN 45, Potassium 4.9, Sodium 136, GFR 34-39 A complete set of results can be found in Results Review.  Recommendations: No changes and encouraged to call for fluid symptoms.  Follow-up plan: ICM clinic phone appointment on 12/28/2018    Copy of ICM check sent to Dr. Caryl Comes.   3 month ICM trend: 11/23/2018    1 Year ICM trend:       [name], RN 11/24/2018 10:26 AM

## 2018-11-30 ENCOUNTER — Other Ambulatory Visit (HOSPITAL_COMMUNITY): Payer: Self-pay | Admitting: Cardiology

## 2018-12-10 ENCOUNTER — Other Ambulatory Visit: Payer: Self-pay

## 2018-12-10 ENCOUNTER — Ambulatory Visit (INDEPENDENT_AMBULATORY_CARE_PROVIDER_SITE_OTHER): Payer: Medicare Other | Admitting: *Deleted

## 2018-12-10 DIAGNOSIS — I428 Other cardiomyopathies: Secondary | ICD-10-CM | POA: Diagnosis not present

## 2018-12-10 LAB — CUP PACEART REMOTE DEVICE CHECK
Battery Remaining Longevity: 108 mo
Battery Voltage: 3.01 V
Brady Statistic RV Percent Paced: 0.01 %
Date Time Interrogation Session: 20200430052203
HighPow Impedance: 69 Ohm
Implantable Lead Implant Date: 20160725
Implantable Lead Location: 753860
Implantable Lead Model: 293
Implantable Lead Serial Number: 383593
Implantable Pulse Generator Implant Date: 20160725
Lead Channel Impedance Value: 380 Ohm
Lead Channel Impedance Value: 399 Ohm
Lead Channel Pacing Threshold Amplitude: 0.75 V
Lead Channel Pacing Threshold Pulse Width: 0.4 ms
Lead Channel Sensing Intrinsic Amplitude: 12.25 mV
Lead Channel Setting Pacing Amplitude: 2.5 V
Lead Channel Setting Pacing Pulse Width: 0.4 ms
Lead Channel Setting Sensing Sensitivity: 0.3 mV

## 2018-12-15 ENCOUNTER — Other Ambulatory Visit: Payer: Self-pay

## 2018-12-15 ENCOUNTER — Other Ambulatory Visit (HOSPITAL_COMMUNITY): Payer: Self-pay | Admitting: Cardiology

## 2018-12-15 ENCOUNTER — Ambulatory Visit (HOSPITAL_COMMUNITY)
Admission: RE | Admit: 2018-12-15 | Discharge: 2018-12-15 | Disposition: A | Discharge status: Home / Self Care | Payer: Medicare Other | Source: Ambulatory Visit | Attending: Cardiology | Admitting: Cardiology

## 2018-12-15 DIAGNOSIS — I739 Peripheral vascular disease, unspecified: Secondary | ICD-10-CM

## 2018-12-17 ENCOUNTER — Telehealth (HOSPITAL_COMMUNITY): Payer: Self-pay

## 2018-12-17 NOTE — Telephone Encounter (Signed)
-----   Message from Larey Dresser, MD sent at 12/15/2018  3:41 PM EDT ----- Mildly decreased ABIs suggests mild PAD. Repeat study 1 year.

## 2018-12-17 NOTE — Telephone Encounter (Signed)
Pt called back, results of ABI's discussed. Pt advised need for repeat in 1 year. Pt will f/u as scheduled in the summer. Pt appreciative and verbalized understanding.

## 2018-12-18 ENCOUNTER — Encounter: Payer: Self-pay | Admitting: Cardiology

## 2018-12-18 NOTE — Progress Notes (Signed)
Remote ICD transmission.   

## 2018-12-27 ENCOUNTER — Other Ambulatory Visit (HOSPITAL_COMMUNITY): Payer: Self-pay | Admitting: Cardiology

## 2018-12-28 ENCOUNTER — Ambulatory Visit (INDEPENDENT_AMBULATORY_CARE_PROVIDER_SITE_OTHER): Payer: Medicare Other

## 2018-12-28 ENCOUNTER — Other Ambulatory Visit: Payer: Self-pay

## 2018-12-28 DIAGNOSIS — Z95810 Presence of automatic (implantable) cardiac defibrillator: Secondary | ICD-10-CM | POA: Diagnosis not present

## 2018-12-28 DIAGNOSIS — I5042 Chronic combined systolic (congestive) and diastolic (congestive) heart failure: Secondary | ICD-10-CM

## 2018-12-31 NOTE — Progress Notes (Signed)
EPIC Encounter for ICM Monitoring  Patient Name: David Bright is a 58 y.o. male Date: 12/31/2018 Primary Care Physican: Stacie Glaze, DO Primary Cardiologist:Nelson/McLean  Electrophysiologist:Klein LastWeight:274lbs 11/25/2018 Weight: 275 lbs   Transmission reviewed.   Optivol Thoracic impedancenormal.  Prescribed: Furosemide40 mg 1tablet a day.Potassium 20 mEq 1 tablet twice a day.   Labs: 10/15/2018 Creatinine 1.26, BUN 18, Potassium 4.2, Sodium 139, GFR >60 08/31/2018 Creatinine 1.56, BUN 29, Potassium 4.3, Sodium 138, GFR 48-56 08/21/2018 Creatinine 2.10, BUN 45, Potassium 4.9, Sodium 136, GFR 34-39 A complete set of results can be found in Results Review.  Recommendations: None.  Follow-up plan: ICM clinic phone appointment on 02/01/2019.  Office visit with Dr Aundra Dubin 01/14/2019.    Copy of ICM check sent to Dr. Caryl Comes.   3 month ICM trend: 12/28/2018    1 Year ICM trend:       [name], RN 12/31/2018 12:49 PM

## 2019-01-15 ENCOUNTER — Encounter (HOSPITAL_COMMUNITY): Payer: Medicaid Other | Admitting: Cardiology

## 2019-02-01 ENCOUNTER — Ambulatory Visit (INDEPENDENT_AMBULATORY_CARE_PROVIDER_SITE_OTHER): Payer: Medicare Other

## 2019-02-01 DIAGNOSIS — I5042 Chronic combined systolic (congestive) and diastolic (congestive) heart failure: Secondary | ICD-10-CM

## 2019-02-01 DIAGNOSIS — Z95810 Presence of automatic (implantable) cardiac defibrillator: Secondary | ICD-10-CM | POA: Diagnosis not present

## 2019-02-05 ENCOUNTER — Telehealth: Payer: Self-pay

## 2019-02-05 DIAGNOSIS — I5042 Chronic combined systolic (congestive) and diastolic (congestive) heart failure: Secondary | ICD-10-CM

## 2019-02-05 DIAGNOSIS — Z95810 Presence of automatic (implantable) cardiac defibrillator: Secondary | ICD-10-CM

## 2019-02-05 NOTE — Progress Notes (Signed)
EPIC Encounter for ICM Monitoring  Patient Name: TORRE SCHAUMBURG is a 58 y.o. male Date: 02/05/2019 Primary Care Physican: Stacie Glaze, DO Primary Cardiologist:Nelson/McLean  Electrophysiologist:Klein LastWeight:274lbs 11/25/2018 Weight: 275 lbs   Attempted call to patient and unable to reach.  Left detailed message per DPR regarding transmission. Transmission reviewed.   Optivol Thoracic impedancenormal.  Prescribed: Furosemide40 mg 1tablet twice a day.Potassium 20 mEq 1 tablet twice a day.   Labs: 10/15/2018 Creatinine 1.26, BUN 18, Potassium 4.2, Sodium 139, GFR >60 08/31/2018 Creatinine 1.56, BUN 29, Potassium 4.3, Sodium 138, GFR 48-56 08/21/2018 Creatinine 2.10, BUN 45, Potassium 4.9, Sodium 136, GFR 34-39 A complete set of results can be found in Results Review.  Recommendations: Left voice mail with ICM number and encouraged to call if experiencing any fluid symptoms.  Follow-up plan: ICM clinic phone appointment on7/29/2020.  Copy of ICM check sent to [place].    3 month ICM trend: 02/01/2019    1 Year ICM trend:       [name], RN 02/05/2019 8:42 AM

## 2019-02-05 NOTE — Telephone Encounter (Signed)
Remote ICM transmission received.  Attempted call to patient regarding ICM remote transmission and left detailed message, per DPR, with next ICM remote transmission date of 03/10/2019.  Advised to return call for any fluid symptoms or questions.

## 2019-03-07 ENCOUNTER — Other Ambulatory Visit (HOSPITAL_COMMUNITY): Payer: Self-pay | Admitting: Cardiology

## 2019-03-10 ENCOUNTER — Ambulatory Visit (INDEPENDENT_AMBULATORY_CARE_PROVIDER_SITE_OTHER): Payer: Medicare Other

## 2019-03-10 DIAGNOSIS — I5042 Chronic combined systolic (congestive) and diastolic (congestive) heart failure: Secondary | ICD-10-CM | POA: Diagnosis not present

## 2019-03-10 DIAGNOSIS — Z95810 Presence of automatic (implantable) cardiac defibrillator: Secondary | ICD-10-CM | POA: Diagnosis not present

## 2019-03-10 NOTE — Progress Notes (Signed)
EPIC Encounter for ICM Monitoring  Patient Name: David Bright is a 58 y.o. male Date: 03/10/2019 Primary Care Physican: Stacie Glaze, DO Primary Cardiologist:Nelson/McLean  Electrophysiologist:Klein 11/25/2018 Weight: 275 lbs 03/10/2019 Weight: 300 lbs   Spoke with patient. Weight gain of 25-30 lb within the last 2 months and slightly SOB.          Eating take out restaurant foods and not cooking.  Patient has been eating more since he was trying to quit smoking.  Advised to avoid foods high in salt such as restaurant foods.   OptivolThoracic impedance suggesting possible ongoing fluid accumulation since 02/04/2019.  Prescribed: Furosemide40 mg 1tablet twice a day.Patient has been taking Potassium 20 mEq 1 a day instead of prescribed dosage of twice a day.  Labs: 10/15/2018 Creatinine 1.26, BUN 18, Potassium 4.2, Sodium 139, GFR >60 08/31/2018 Creatinine 1.56, BUN 29, Potassium 4.3, Sodium 138, GFR 48-56 08/21/2018 Creatinine 2.10, BUN 45, Potassium 4.9, Sodium 136, GFR 34-39 A complete set of results can be found in Results Review.  Recommendations:     Advised will send copy of note to Dr Aundra Dubin for review regarding:                                      1. 25-30 lb weight gain in 2 months & SOB                                      2. Taking Potassium 20 mEq once a day instead of bid as prescribed.                                      3. Confirmed he is taking Furosemide 40 mg bid                                      4. Not follow low salt diet                                                Will call back with any recommendations from Dr Aundra Dubin.  Follow-up plan: ICM clinic phone appointment on8/11/2018 (manual send) to recheck fluid levels.  Copy of ICM check sent to Dr.Klein and Dr Aundra Dubin.    3 month ICM trend: 03/10/2019    1 Year ICM trend:       [name], RN 03/10/2019 12:35 PM

## 2019-03-11 ENCOUNTER — Ambulatory Visit (INDEPENDENT_AMBULATORY_CARE_PROVIDER_SITE_OTHER): Payer: Medicare Other | Admitting: *Deleted

## 2019-03-11 DIAGNOSIS — I428 Other cardiomyopathies: Secondary | ICD-10-CM

## 2019-03-12 LAB — CUP PACEART REMOTE DEVICE CHECK
Battery Remaining Longevity: 104 mo
Battery Voltage: 3.01 V
Brady Statistic RV Percent Paced: 0.01 %
Date Time Interrogation Session: 20200730094343
HighPow Impedance: 70 Ohm
Implantable Lead Implant Date: 20160725
Implantable Lead Location: 753860
Implantable Lead Model: 293
Implantable Lead Serial Number: 383593
Implantable Pulse Generator Implant Date: 20160725
Lead Channel Impedance Value: 342 Ohm
Lead Channel Impedance Value: 342 Ohm
Lead Channel Pacing Threshold Amplitude: 0.625 V
Lead Channel Pacing Threshold Pulse Width: 0.4 ms
Lead Channel Sensing Intrinsic Amplitude: 12.25 mV
Lead Channel Sensing Intrinsic Amplitude: 12.25 mV
Lead Channel Setting Pacing Amplitude: 2.5 V
Lead Channel Setting Pacing Pulse Width: 0.4 ms
Lead Channel Setting Sensing Sensitivity: 0.3 mV

## 2019-03-14 NOTE — Progress Notes (Signed)
Increase Lasix to 80 qam/40 qpm and add an extra 20 mEq KCl daily.  BMET in 1 week and needs appointment in CHF clinic.

## 2019-03-15 MED ORDER — FUROSEMIDE 40 MG PO TABS: ORAL_TABLET | ORAL | 2 refills | Status: DC

## 2019-03-15 NOTE — Addendum Note (Signed)
Addended by: [name] on: 03/15/2019 01:11 PM   Modules accepted: Orders

## 2019-03-15 NOTE — Progress Notes (Addendum)
Spoke with patient.  Advised Dr Aundra Dubin recommended to increase Lasix to 80 qam/40 qpm and add an extra 20 mEq KCl daily.  Advised to take Potassium 20 meq 1 tablet twice a day.  BMET in 1 week and scheduled for 03/23/2019 at 10:30 AM at HF clinic.  Message sent to HF clinic scheduler to call patient to schedule an appt in CHF clinic.

## 2019-03-16 ENCOUNTER — Telehealth (HOSPITAL_COMMUNITY): Payer: Self-pay | Admitting: Cardiology

## 2019-03-16 NOTE — Telephone Encounter (Signed)
Patient returned my call.  He is aware of appt 03/23/2019 @9 :30 with APP.

## 2019-03-16 NOTE — Telephone Encounter (Signed)
Called and left message for pt to call back.  Needs f/u APP side per Sharman Cheek, RN @[facility].  Pt requesting appt same day as labs on 03/23/2019. When pt calls back will try to schedule on APP side @9 :30 am that day.

## 2019-03-19 ENCOUNTER — Encounter: Payer: Self-pay | Admitting: Cardiology

## 2019-03-19 ENCOUNTER — Ambulatory Visit (INDEPENDENT_AMBULATORY_CARE_PROVIDER_SITE_OTHER): Payer: Medicare Other

## 2019-03-19 DIAGNOSIS — I5042 Chronic combined systolic (congestive) and diastolic (congestive) heart failure: Secondary | ICD-10-CM

## 2019-03-19 DIAGNOSIS — Z95810 Presence of automatic (implantable) cardiac defibrillator: Secondary | ICD-10-CM

## 2019-03-19 NOTE — Progress Notes (Signed)
Remote ICD transmission.   

## 2019-03-19 NOTE — Progress Notes (Signed)
EPIC Encounter for ICM Monitoring  Patient Name: David Bright is a 58 y.o. male Date: 03/19/2019 Primary Care Physican: Stacie Glaze, DO Primary Cardiologist:Nelson/McLean  Electrophysiologist:Klein 11/25/2018 Weight: 275 lbs 03/10/2019 Weight: 300 lbs 03/19/2019 Weight: 298.4lbs  Spoke with patient.  His lost 2 lbs since Furosemide was increased. He has an appt with kidney physician next week as well.  OptivolThoracic impedance returned to normal after Dr Aundra Dubin increased Lasix to 80mg /40mg  daily.  Prescribed: Furosemide40 mg take 2 tablets (80 mg total) every morning and 1tablet (40 mg total) every evening.Potassium 20 mEq take 1 tablet twice a day.  Labs: 10/15/2018 Creatinine 1.26, BUN 18, Potassium 4.2, Sodium 139, GFR >60 08/31/2018 Creatinine 1.56, BUN 29, Potassium 4.3, Sodium 138, GFR 48-56 08/21/2018 Creatinine 2.10, BUN 45, Potassium 4.9, Sodium 136, GFR 34-39 A complete set of results can be found in Results Review.  Recommendations: Advised to limit salt intake.   Follow-up plan: ICM clinic phone appointment on8/31/2020.  HF clinic appt 03/23/2019.  Copy of ICM check sent to [place].  3 month ICM trend: 03/19/2019    1 Year ICM trend:       [name], RN 03/19/2019 11:59 AM

## 2019-03-22 NOTE — Progress Notes (Signed)
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 male with history of chronic systolic CHF and COPD/active smoking presents for followup of CHF.  He has a known nonischemic cardiomyopathy, echo in 1/17 with EF 20% and a severely dilated LV.  No coronary disease on 7/16 cardiac cath.  Repeat echo was 2/18, showing EF up to 40%.   He is trying stop smoking with Chantix, down to 2-3 cigs/day. Still with leg fatigue/cramping after walking 100 yards.  He had significant PAD by peripheral arterial dopplers, saw Dr. Gwenlyn Found with plan of medical management for now. He also has knee pain which is limiting.   Echo 09/2017: EF up to 50-55% with mild LVH.  Echo 08/21/2018 EF up to 55% with normal RV.  Today he returns for HF follow up. He has been taking extra lasix due to elevated fluid index. Overall feeling fine. Denies SOB/PND/Orthopnea. Appetite ok. No fever or chills. Weight at home 290 pounds. Taking all medications. Smoking 3-4 cigarettes.   ICD: Fluid index down . No AF VT . Activity ~1 hour per day.   Labs (12/17): K 4.3, creatinine 1.09 Labs (1/18): K 3.9, creatinine 1.13 Labs (3/18): K 4.3, creatinine 1.48, NT-proBNP 61 Labs (4/18): K 4.5, creatinine 1.23, LDL 71, BNP 34 Labs (5/18): K 4.5, creatinine 1.2 Labs (1/19): K 4.2, creatinine 1.19, LDL 69, HDL 39 Labs (11/19): LDL 66, HDL 34, K 3.3, creatinine 1.19, BNP 45 Labs (12/19): K 4.4, creatinine 1.59  PMH:  1. Chronic systolic CHF: Nonischemic cardiomyopathy.  Has Medtronic ICD.   - LHC (7/16) with nonobstructive disease.  - Echo (1/17) with EF 20%, severe LV dilation, mild to moderate MR.  - Echo (2/18): EF 40%, mild LV dilation.  - Echo (5/19): EF 50-55%, mild LVH - Echo (1/20): EF 55-60%, mild LVH, normal RV size and systolic function.  2. COPD: Active smoker.  - PFTs (2/18) with minimal obstruction, mild restriction suggesting a parenchymal process, low DLCO.  - CT chest (6/18): No ILD, +emphysema, lung  nodules.  - CT chest (11/19): Emphysema, no ILD, stable nodules 3. Type II diabetes with peripheral neuropathy.  4. OSA on CPAP 5. HTN 6. Hyperlipidemia 7. PAD: ABIs abnormal at outside office.  - Peripheral arterial dopplers (3/18) with > 50% left CIA and bilateral EIA stenosis, 50-74% mid LSFA stenosis.  - ABIs (2/19): 0.85 right, 0.86 left (mild).   SH: Lives in [place], works at [employer] and [employer], smokes about 4-5 cigs/day now.  Rare ETOH.  Remote substance abuse.   FH: Aunt with CHF, brother with ESRD.    Review of systems complete and found to be negative unless listed in HPI.    Current Outpatient Medications  Medication Sig Dispense Refill  . aspirin EC 81 MG tablet Take 1 tablet (81 mg total) by mouth daily. 30 tablet 0  . atorvastatin (LIPITOR) 20 MG tablet Take 1 tablet (20 mg total) by mouth daily. 30 tablet 3  . carvedilol (COREG) 6.25 MG tablet Take 1 tablet (6.25 mg total) by mouth 2 (two) times daily with a meal.    . CORLANOR 5 MG TABS tablet TAKE 1 TABLET BY MOUTH TWICE DAILY WITH MEALS 60 tablet 3  . ENTRESTO 97-103 MG Take 1 tablet by mouth twice daily 60 tablet 5  . furosemide (LASIX) 40 MG tablet Take 2 tablets (80 mg total) every morning and 1 tablet (40 mg total) every evening. 270 tablet 2  . isosorbide-hydrALAZINE (  BIDIL) 20-37.5 MG tablet Take 1 tablet by mouth 3 (three) times daily.    . mesalamine (LIALDA) 1.2 g EC tablet Take 1.2 g 2 (two) times daily by mouth.    . potassium chloride SA (K-DUR,KLOR-CON) 20 MEQ tablet Take 1 tablet (20 mEq total) by mouth 2 (two) times daily. 180 tablet 3  . sitaGLIPtin-metformin (JANUMET) 50-1000 MG tablet Take 1 tablet by mouth 2 (two) times daily with a meal.    . spironolactone (ALDACTONE) 25 MG tablet Take 1 tablet (25 mg total) by mouth daily. 90 tablet 3   No current facility-administered medications for this encounter.    Vitals:   03/23/19 0924  BP: 102/64  Pulse: 81  SpO2: 98%  Weight: (!) 136.6 kg (301 lb  3.2 oz)     Wt Readings from Last 3 Encounters:  03/23/19 (!) 136.6 kg (301 lb 3.2 oz)  10/15/18 125.6 kg (277 lb)  09/30/18 125.2 kg (276 lb)    General:  Well appearing. No resp difficulty HEENT: normal Neck: supple. no JVD. Carotids 2+ bilat; no bruits. No lymphadenopathy or thryomegaly appreciated. Cor: PMI nondisplaced. Regular rate & rhythm. No rubs, gallops or murmurs. Lungs: clear Abdomen: soft, nontender, nondistended. No hepatosplenomegaly. No bruits or masses. Good bowel sounds. Extremities: no cyanosis, clubbing, rash, edema Neuro: alert & orientedx3, cranial nerves grossly intact. moves all 4 extremities w/o difficulty. Affect pleasant  Assessment/Plan: 1. Chronic systolic CHF: Nonischemic cardiomyopathy => ?due to viral myocarditis or prior substance abuse.  Medtronic ICD.  Echo 1/17 with severely dilated LV and EF 20%, EF up to 40% on echo 2/18.  Echo 08/21/18 EF 55% with normal RV.   - NYHA II. Volume status stable despite weight gain.  Continue lasix 80 mg in am and 40mg  in pm. Check BMET.   Continue Entresto 97/103 mg BID - Continue spironolactone 25 mg daily - Continue Bidil 1 tab TID.  - Continue coreg 6.25 mg BID.  2. Smoking/suspect COPD:  PFTs done, somewhat concerning for interstitial lung disease rather than emphysema with restrictive PFTs and decreased DLCO => no ILD on CT, emphysema noted along with lung nodules. Most recent repeat CT in 11/19 showed stable nodules, do not need repeat.  He continues to smoke. Discussed smoking cessation.  3. OSA:  - Encouraged nightly CPAP.  4. PAD: Significant disease on peripheral dopplers, stable mild claudication. He saw Dr. Gwenlyn Found, medical management recommended.  Encouraged him to try to walk at least a short distance through the pain to build collaterals.  - Continue ASA 81 and statin. Good lipids in 1/19.   - Followup with Dr. Gwenlyn Found.  5. CKD Stage III Check BMEt today.   Follow up with Dr Aundra Dubin in 3 months.  Discussed and reviewed optivol.   Darrick Grinder, NP  03/23/2019

## 2019-03-23 ENCOUNTER — Ambulatory Visit (HOSPITAL_COMMUNITY)
Admission: RE | Admit: 2019-03-23 | Discharge: 2019-03-23 | Disposition: A | Discharge status: Home / Self Care | Payer: Medicare Other | Source: Ambulatory Visit | Attending: Cardiology | Admitting: Cardiology

## 2019-03-23 ENCOUNTER — Other Ambulatory Visit (HOSPITAL_COMMUNITY): Payer: Medicare Other

## 2019-03-23 ENCOUNTER — Other Ambulatory Visit: Payer: Self-pay

## 2019-03-23 VITALS — BP 102/64 | HR 81 | Wt 301.2 lb

## 2019-03-23 DIAGNOSIS — N183 Chronic kidney disease, stage 3 (moderate): Secondary | ICD-10-CM | POA: Diagnosis not present

## 2019-03-23 DIAGNOSIS — F17210 Nicotine dependence, cigarettes, uncomplicated: Secondary | ICD-10-CM | POA: Insufficient documentation

## 2019-03-23 DIAGNOSIS — I428 Other cardiomyopathies: Secondary | ICD-10-CM | POA: Diagnosis not present

## 2019-03-23 DIAGNOSIS — Z720 Tobacco use: Secondary | ICD-10-CM

## 2019-03-23 DIAGNOSIS — I739 Peripheral vascular disease, unspecified: Secondary | ICD-10-CM | POA: Insufficient documentation

## 2019-03-23 DIAGNOSIS — J449 Chronic obstructive pulmonary disease, unspecified: Secondary | ICD-10-CM | POA: Diagnosis not present

## 2019-03-23 DIAGNOSIS — E785 Hyperlipidemia, unspecified: Secondary | ICD-10-CM | POA: Diagnosis not present

## 2019-03-23 DIAGNOSIS — Z79899 Other long term (current) drug therapy: Secondary | ICD-10-CM | POA: Diagnosis not present

## 2019-03-23 DIAGNOSIS — Z7982 Long term (current) use of aspirin: Secondary | ICD-10-CM | POA: Diagnosis not present

## 2019-03-23 DIAGNOSIS — I5022 Chronic systolic (congestive) heart failure: Secondary | ICD-10-CM | POA: Insufficient documentation

## 2019-03-23 DIAGNOSIS — E1142 Type 2 diabetes mellitus with diabetic polyneuropathy: Secondary | ICD-10-CM | POA: Diagnosis not present

## 2019-03-23 DIAGNOSIS — I130 Hypertensive heart and chronic kidney disease with heart failure and stage 1 through stage 4 chronic kidney disease, or unspecified chronic kidney disease: Secondary | ICD-10-CM | POA: Insufficient documentation

## 2019-03-23 DIAGNOSIS — Z7984 Long term (current) use of oral hypoglycemic drugs: Secondary | ICD-10-CM | POA: Insufficient documentation

## 2019-03-23 DIAGNOSIS — G4733 Obstructive sleep apnea (adult) (pediatric): Secondary | ICD-10-CM | POA: Insufficient documentation

## 2019-03-23 DIAGNOSIS — E1122 Type 2 diabetes mellitus with diabetic chronic kidney disease: Secondary | ICD-10-CM | POA: Diagnosis not present

## 2019-03-23 LAB — BASIC METABOLIC PANEL
Anion gap: 12 (ref 5–15)
BUN: 52 mg/dL — ABNORMAL HIGH (ref 6–20)
CO2: 20 mmol/L — ABNORMAL LOW (ref 22–32)
Calcium: 8.5 mg/dL — ABNORMAL LOW (ref 8.9–10.3)
Chloride: 104 mmol/L (ref 98–111)
Creatinine, Ser: 1.66 mg/dL — ABNORMAL HIGH (ref 0.61–1.24)
GFR calc Af Amer: 52 mL/min — ABNORMAL LOW (ref >60)
GFR calc non Af Amer: 44 mL/min — ABNORMAL LOW (ref >60)
Glucose, Bld: 199 mg/dL — ABNORMAL HIGH (ref 70–99)
Potassium: 4.5 mmol/L (ref 3.5–5.1)
Sodium: 136 mmol/L (ref 135–145)

## 2019-03-23 NOTE — Patient Instructions (Signed)
It was great to see you today! No medication changes are needed at this time.  Your physician recommends that you schedule a follow-up appointment in: 3 months  Do the following things EVERYDAY: 1) Weigh yourself in the morning before breakfast. Write it down and keep it in a log. 2) Take your medicines as prescribed 3) Eat low salt foods-Limit salt (sodium) to 2000 mg per day.  4) Stay as active as you can everyday 5) Limit all fluids for the day to less than 2 liters   At the [facility], you and your health needs are our priority. As part of our continuing mission to provide you with exceptional heart care, we have created designated Provider Care Teams. These Care Teams include your primary Cardiologist (physician) and Advanced Practice Providers (APPs- Physician Assistants and Nurse Practitioners) who all work together to provide you with the care you need, when you need it.   You may see any of the following providers on your designated Care Team at your next follow up: Marland Kitchen Dr Glori Bickers . Dr Loralie Champagne . Darrick Grinder, NP   Please be sure to bring in all your medications bottles to every appointment.

## 2019-04-12 ENCOUNTER — Ambulatory Visit (INDEPENDENT_AMBULATORY_CARE_PROVIDER_SITE_OTHER): Payer: Medicare Other

## 2019-04-12 DIAGNOSIS — Z95810 Presence of automatic (implantable) cardiac defibrillator: Secondary | ICD-10-CM | POA: Diagnosis not present

## 2019-04-12 DIAGNOSIS — I5042 Chronic combined systolic (congestive) and diastolic (congestive) heart failure: Secondary | ICD-10-CM | POA: Diagnosis not present

## 2019-04-14 NOTE — Progress Notes (Signed)
EPIC Encounter for ICM Monitoring  Patient Name: David Bright is a 58 y.o. male Date: 04/14/2019 Primary Care Physican: Stacie Glaze, DO Primary Cardiologist:Nelson/McLean  Electrophysiologist:Klein 03/10/2019 Weight: 300 lbs   Spoke with patient.   He said he is doing well and denies fluid symptoms.  He will be having eye surgery.    OptivolThoracic impedance normal  Prescribed: Furosemide40 mg Take 2 tablets (80 mg total) every morning and 1 tablet (40 mg total) every evening.  Potassium 20 mEq 1 a day twice a day.  Labs: 03/23/2019 Creatinine 1.66, BUN 52, Potassium 4.5, Sodium 136, GFR 44-52 10/15/2018 Creatinine 1.26, BUN 18, Potassium 4.2, Sodium 139, GFR >60 08/31/2018 Creatinine 1.56, BUN 29, Potassium 4.3, Sodium 138, GFR 48-56 08/21/2018 Creatinine 2.10, BUN 45, Potassium 4.9, Sodium 136, GFR 34-39 A complete set of results can be found in Results Review.  Recommendations: No changes and encouraged to call if experiencing any fluid symptoms.  Follow-up plan: ICM clinic phone appointment on 05/31/2019.   91 day device clinic remote transmission 06/10/2019.  Office appt 06/23/2019 with Dr. Aundra Dubin.    Copy of ICM check sent to Dr. Caryl Comes.   3 month ICM trend: 04/12/2019    1 Year ICM trend:       [name], RN 04/14/2019 11:21 AM

## 2019-04-17 IMAGING — DX DG CHEST 2V
2 series · 2 of 2 positions shown · non-contrast
Comparison: 03/07/2015.  04/27/2014.

CLINICAL DATA: Interstitial lung disease.

EXAM:
CHEST  2 VIEW

[w chest pa]
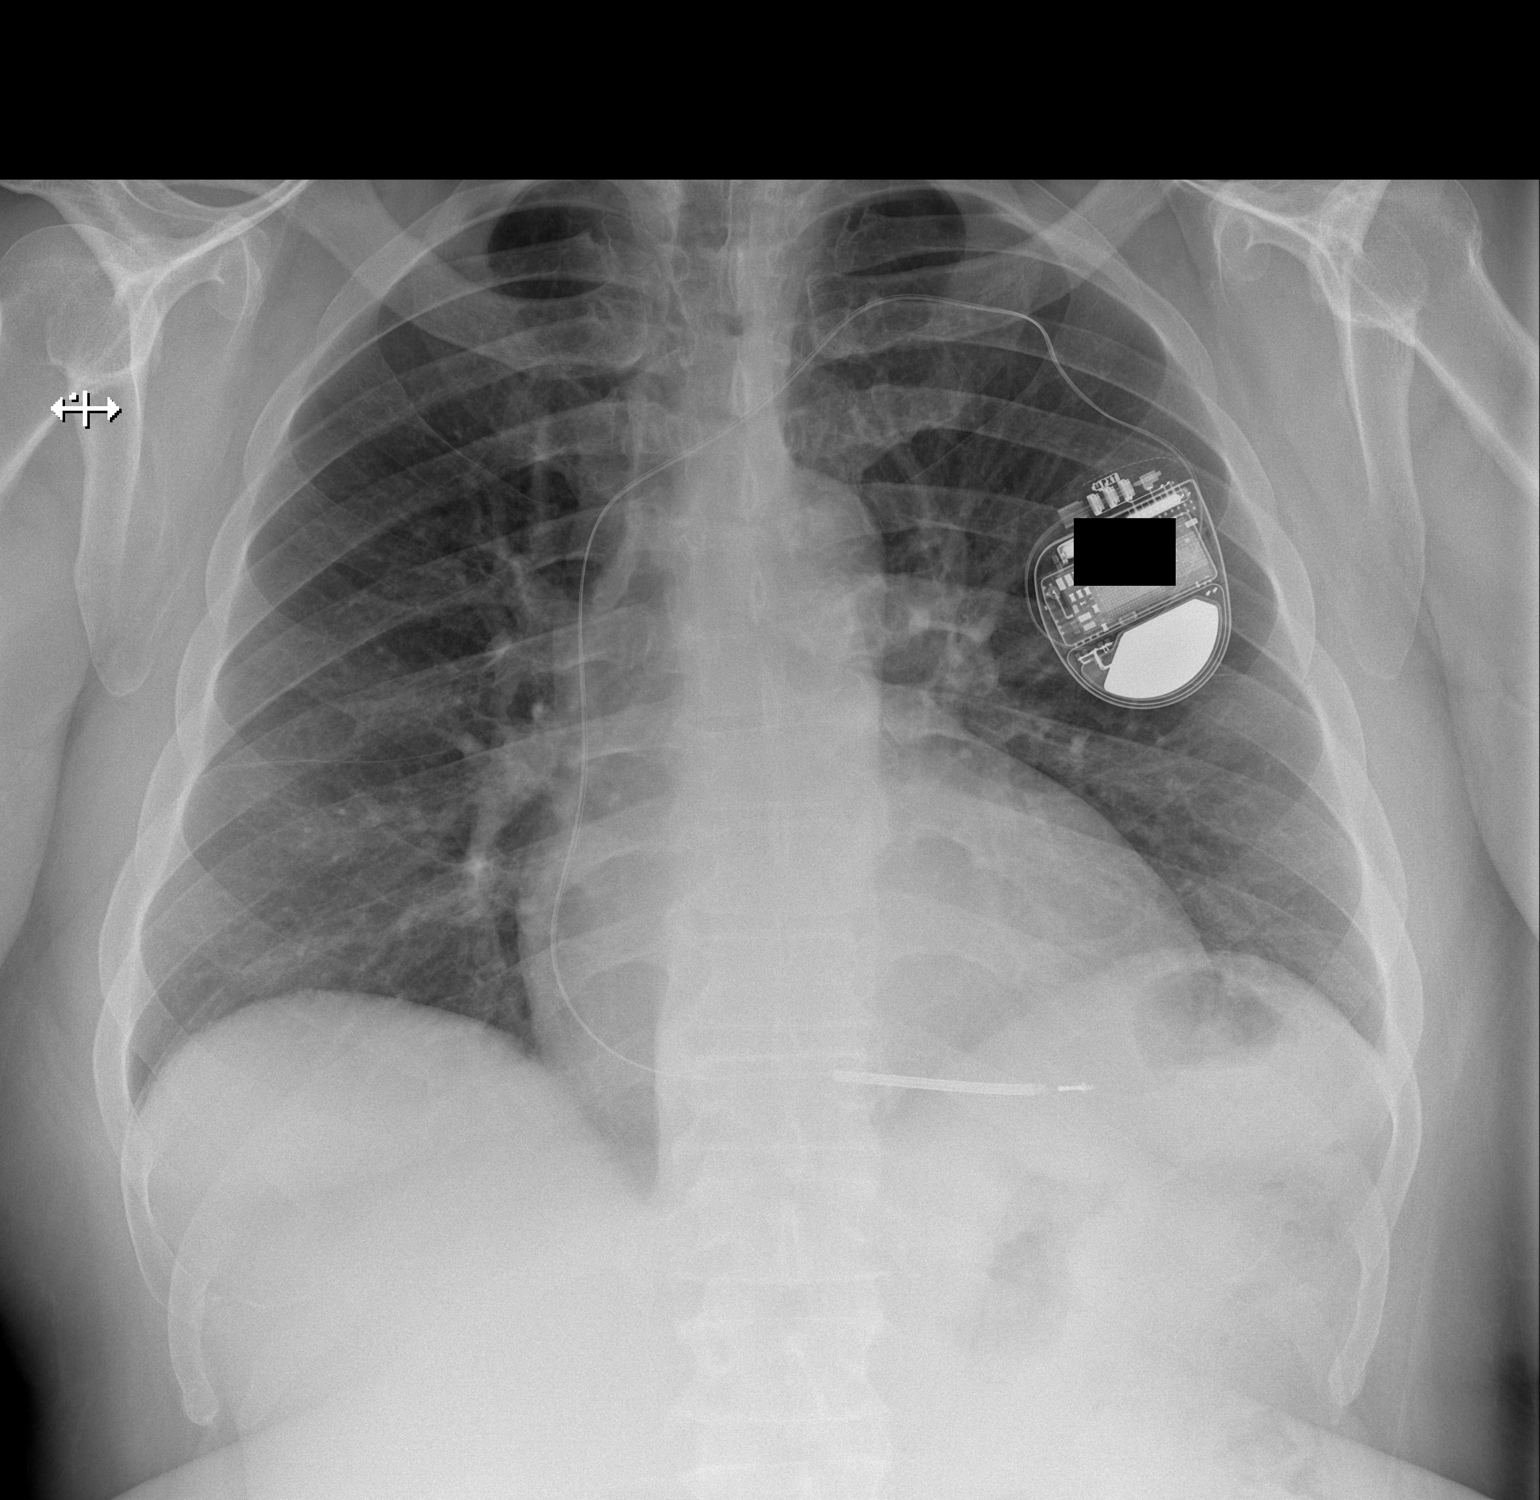

[w chest lat]
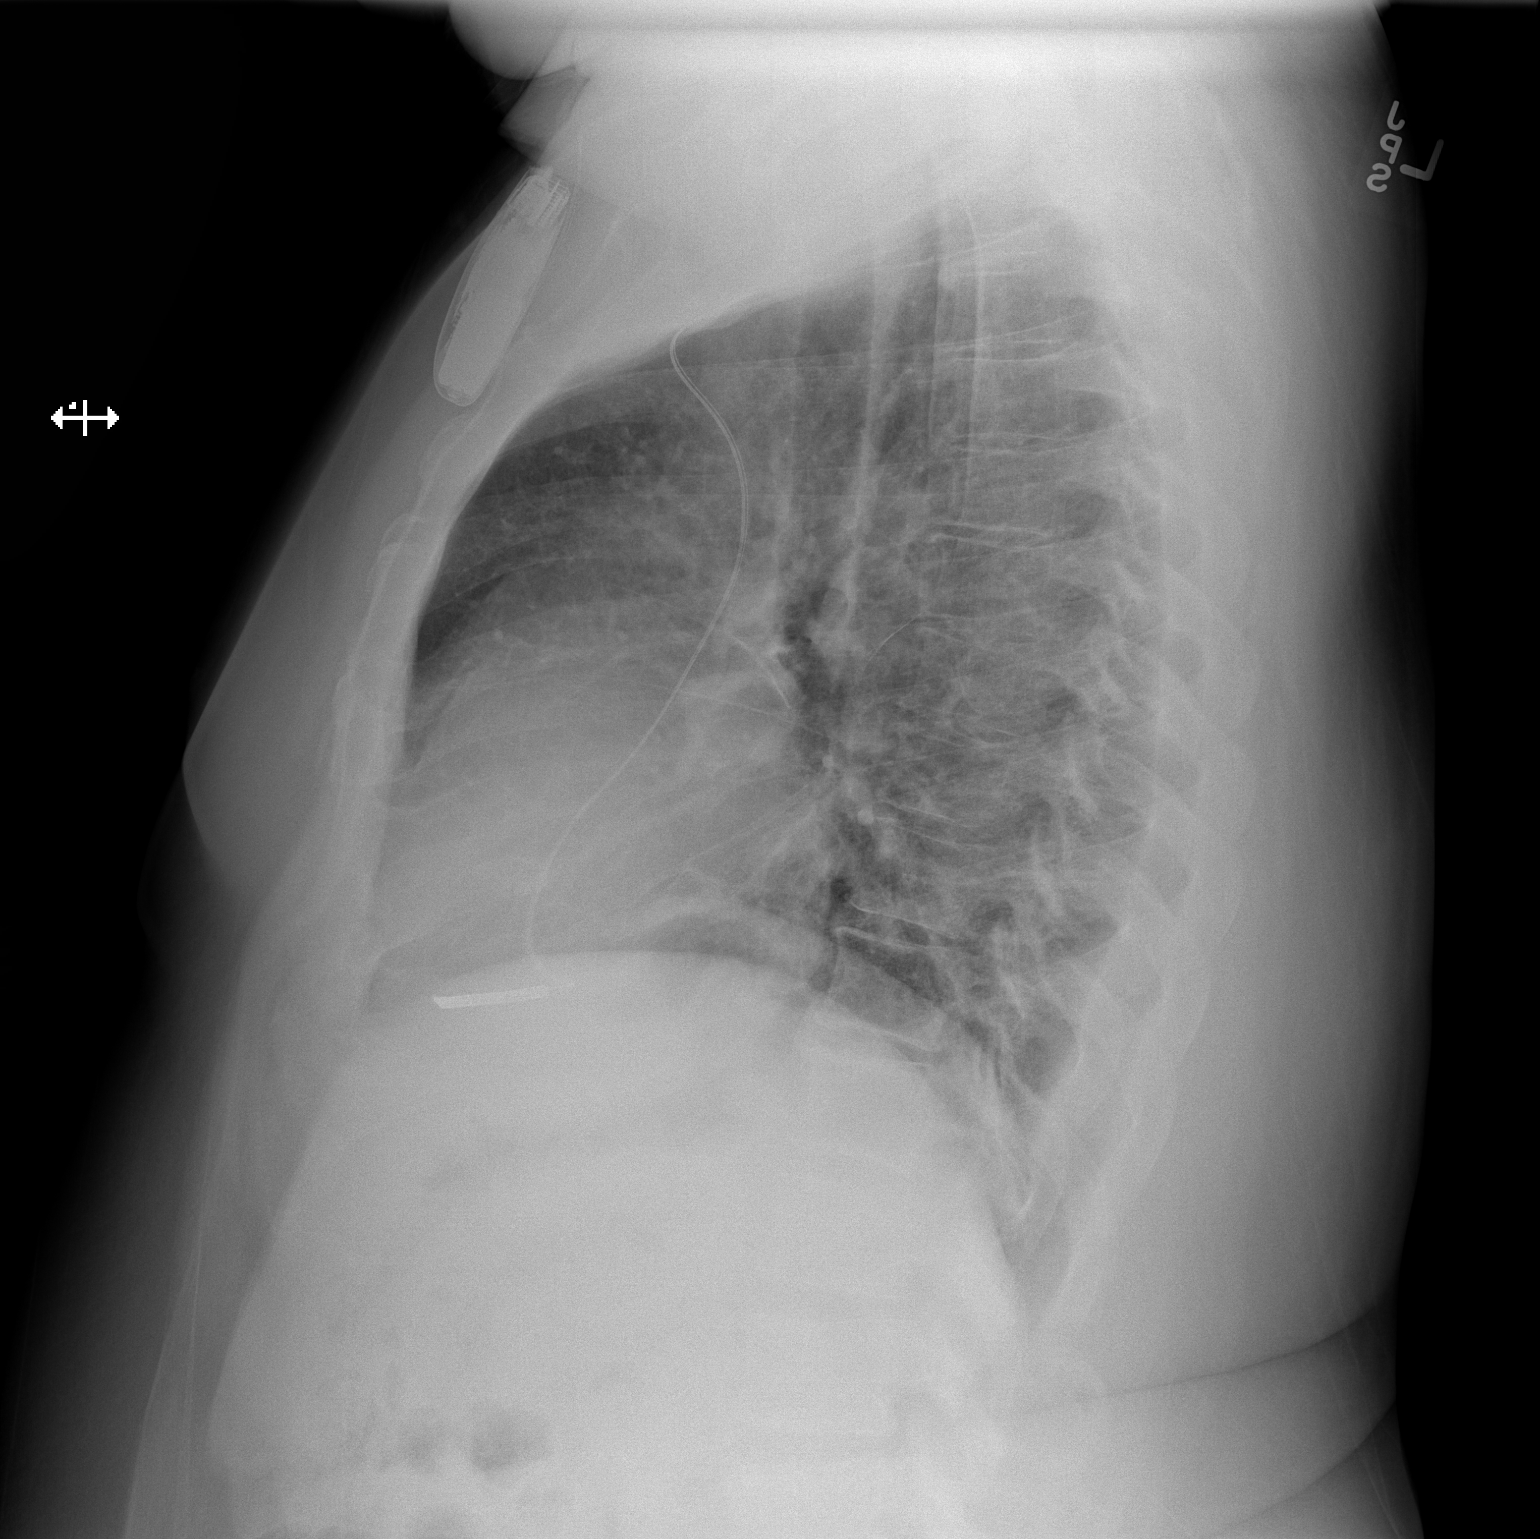

[2 of 2 positions shown; findings below may reference images not displayed]

FINDINGS: Cardiac pacer with lead tip over right ventricle. Cardiomegaly with
normal pulmonary vascularity. No focal infiltrate. No pleural
effusion or pneumothorax. Degenerative changes thoracic spine.
IMPRESSION: 1. Cardiac pacer with lead tip over right ventricle. Cardiomegaly.
No pulmonary venous congestion.

2. Low lung volumes with mild basilar atelectasis. No focal
infiltrate.

## 2019-04-21 ENCOUNTER — Other Ambulatory Visit (HOSPITAL_COMMUNITY): Payer: Self-pay | Admitting: Cardiology

## 2019-04-22 ENCOUNTER — Telehealth (HOSPITAL_COMMUNITY): Payer: Self-pay

## 2019-04-22 NOTE — Telephone Encounter (Signed)
Pt left vm stating that he was seen by his primary doctor and noted his bp being low. He is scheduled for eye surgery and he is inquiring if his meds should be adjusted.  I called patient back, no answer, unable to leave vm. Will try again

## 2019-04-23 NOTE — Telephone Encounter (Signed)
Called again, no answer. Left message asking for return call

## 2019-04-24 IMAGING — CT CT CHEST HIGH RESOLUTION W/O CM
2 of 5 series · 14 of 36 positions shown, 17 images · non-contrast
Comparison: No priors.

CLINICAL DATA: 56-year-old male with shortness of breath for some
time. Evaluate for interstitial lung disease.

EXAM:
CT CHEST WITHOUT CONTRAST
TECHNIQUE: Multidetector CT imaging of the chest was performed following the
standard protocol without intravenous contrast. High resolution
imaging of the lungs, as well as inspiratory and expiratory imaging,
was performed.

[Series 5: high resolution 2.0 b30f · axial · 0.86mm/px · z∈[+1225,+1505]mm · 11 of 163 slices shown, 14 images]
[im 15/163  mediastinal]
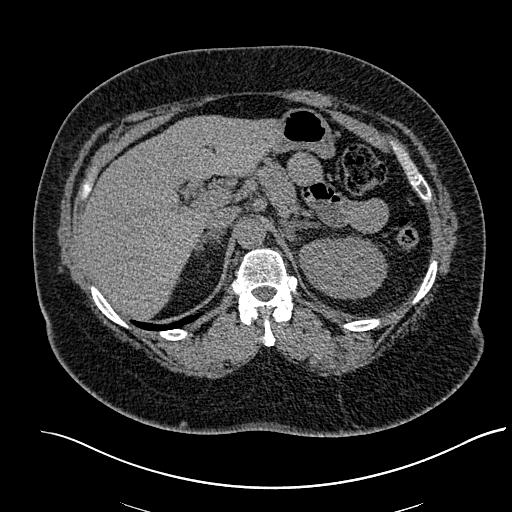
[im 15/163  lung]
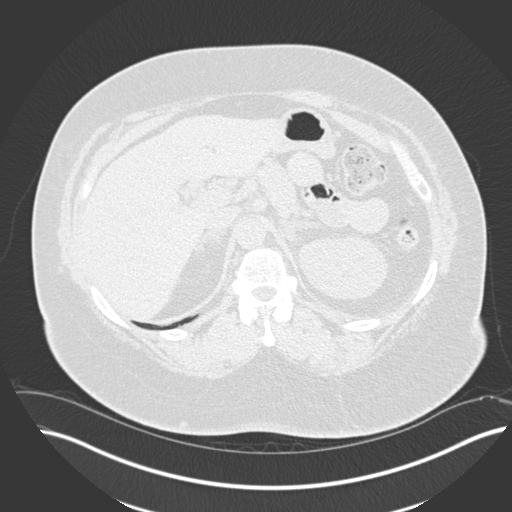
[im 29/163  lung]
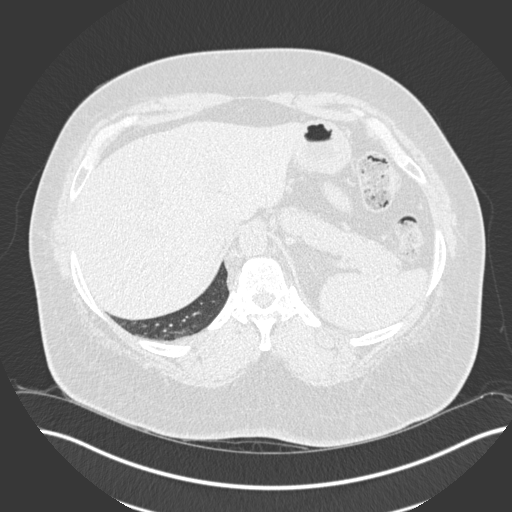
[im 43/163  lung]
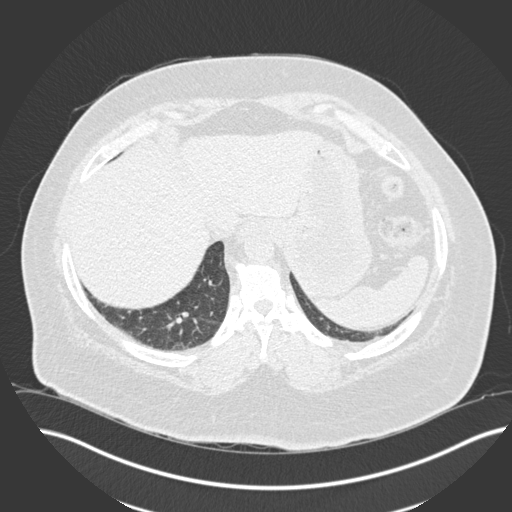
[im 57/163  lung]
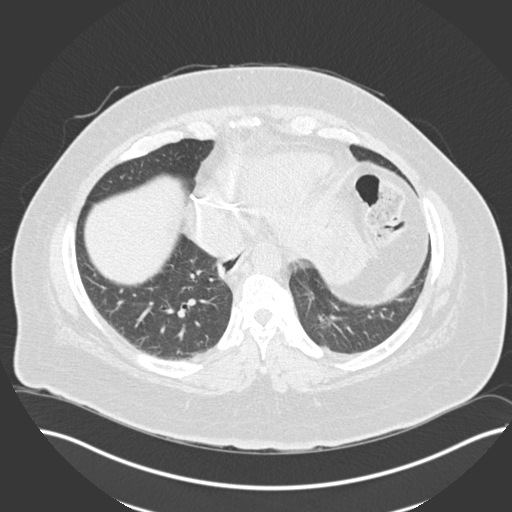
[im 71/163  mediastinal]
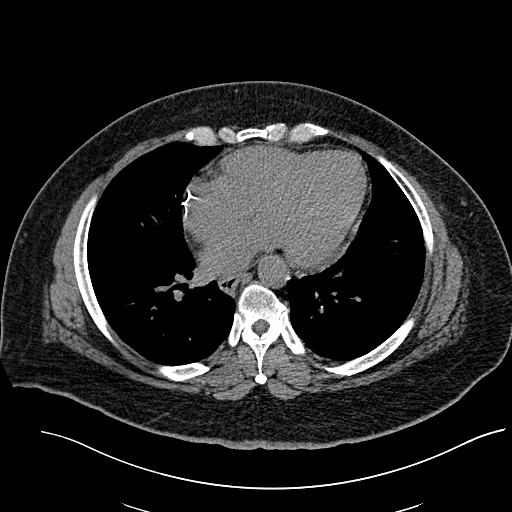
[im 71/163  lung]
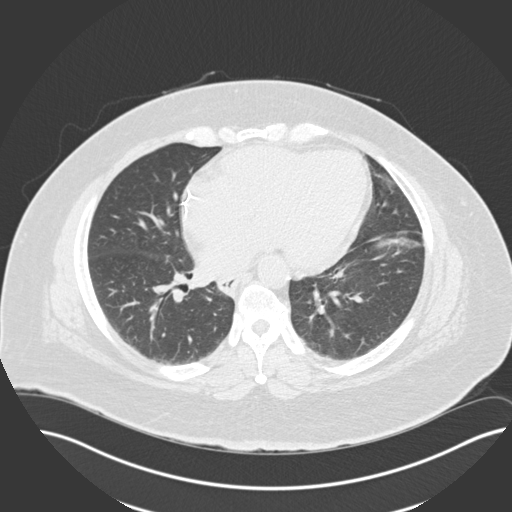
[im 85/163  lung]
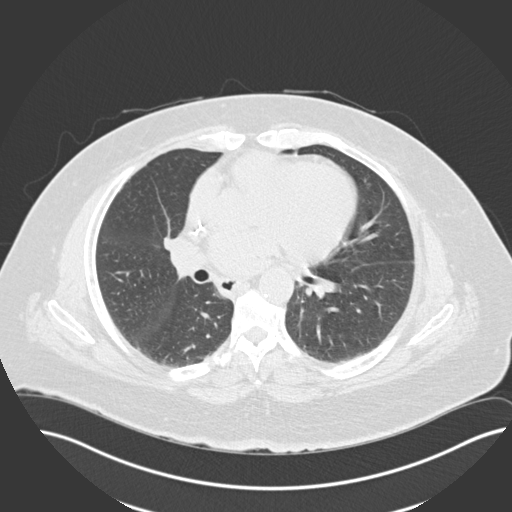
[im 99/163  lung]
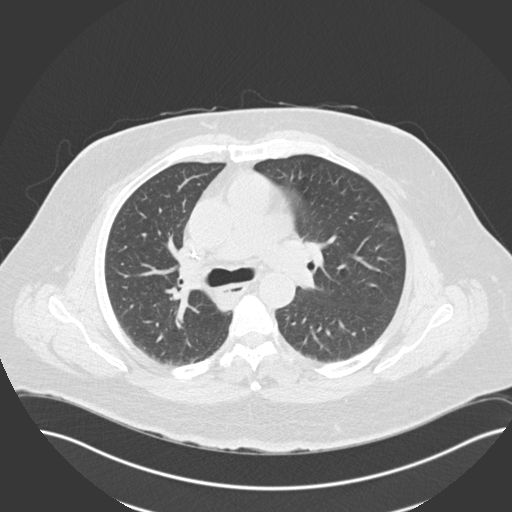
[im 113/163  lung]
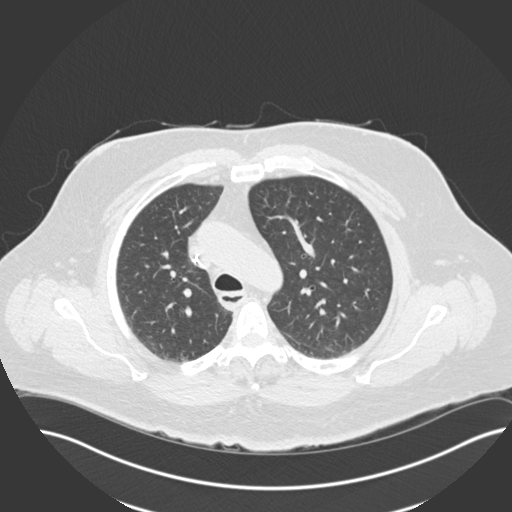
[im 127/163  mediastinal]
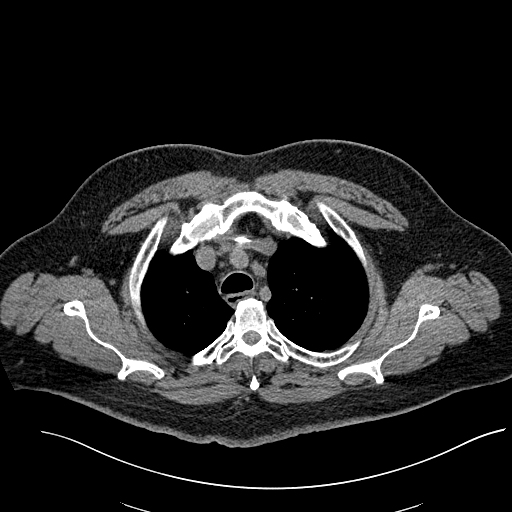
[im 127/163  lung]
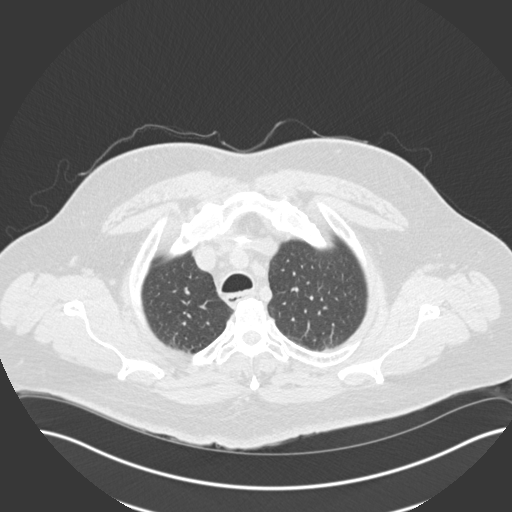
[im 141/163  lung]
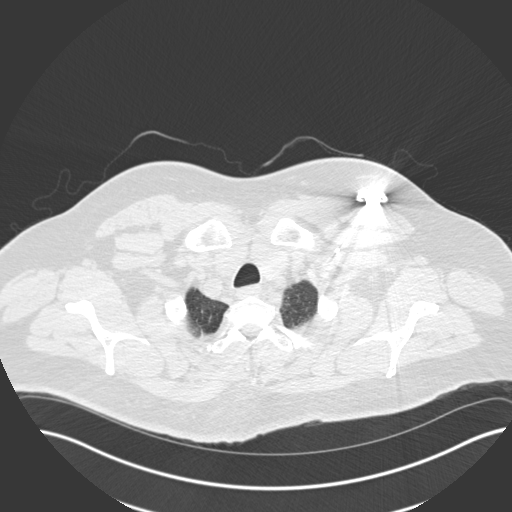
[im 155/163  lung]
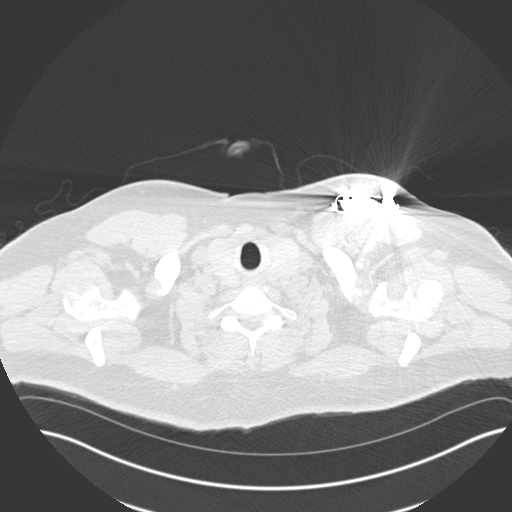

[Series 8: coronal · coronal · 0.64mm/px · 3 of 146 slices shown]
[im 30/146  lung]
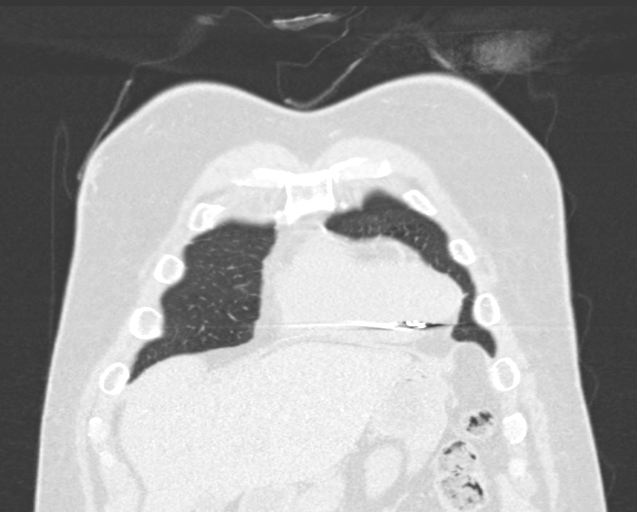
[im 59/146  lung]
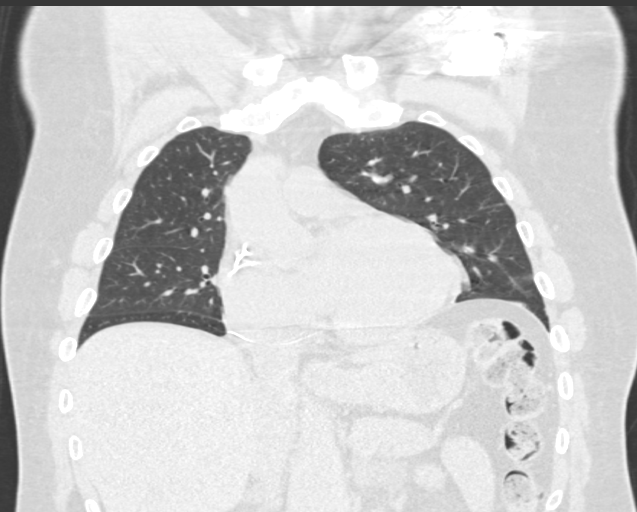
[im 88/146  lung]
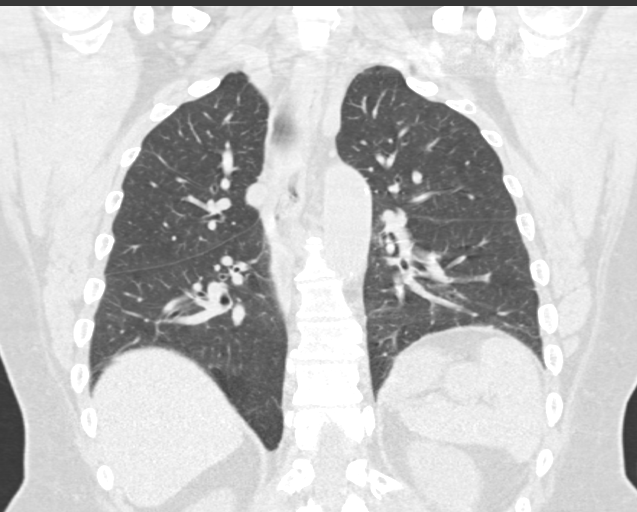

[14 of 36 positions shown; findings below may reference images not displayed]

FINDINGS: Cardiovascular: Heart size is mildly enlarged. There is no
significant pericardial fluid, thickening or pericardial
calcification. There is aortic atherosclerosis, as well as
atherosclerosis of the great vessels of the mediastinum and the
coronary arteries, including calcified atherosclerotic plaque in the
right coronary artery. Left-sided pacemaker/AICD with lead tip
terminating in the right ventricular apex.

Mediastinum/Nodes: No pathologically enlarged mediastinal or hilar
lymph nodes. Please note that accurate exclusion of hilar adenopathy
is limited on noncontrast CT scans. Esophagus is unremarkable in
appearance. No axillary lymphadenopathy.

Lungs/Pleura: Several small subpleural nodules are noted in the
lungs bilaterally measuring up to 5 mm. No larger more suspicious
appearing pulmonary nodules or masses are noted. Mild diffuse
bronchial wall thickening with mild paraseptal emphysema. No acute
consolidative airspace disease. No pleural effusions.
High-resolution images demonstrate no significant regions of
ground-glass attenuation, subpleural reticulation, parenchymal
banding, traction bronchiectasis or frank honeycombing. Inspiratory
and expiratory imaging is unremarkable.

Upper Abdomen: Aortic atherosclerosis. 1.6 cm low-attenuation (1
Hounsfield unit left adrenal lesion is compatible with an adenoma.

Musculoskeletal: There are no aggressive appearing lytic or blastic
lesions noted in the visualized portions of the skeleton.
IMPRESSION: 1. No evidence of interstitial lung disease.
2. No acute findings in the thorax.
3. Mild diffuse bronchial wall thickening with mild paraseptal
emphysema; imaging findings suggestive of underlying COPD.
4. Multiple small subpleural pulmonary nodules measuring 5 mm or
less in size. This is highly nonspecific, but statistically likely
benign (likely subpleural lymph nodes). No follow-up needed if
patient is low-risk (and has no known or suspected primary
neoplasm). Non-contrast chest CT can be considered in 12 months if
patient is high-risk. This recommendation follows the consensus
statement: Guidelines for Management of Incidental Pulmonary Nodules
Detected on CT Images: From the [HOSPITAL] 1529; Radiology
1529; [DATE].
5. Aortic atherosclerosis, in addition to right coronary artery
disease. Please note that although the presence of coronary artery
calcium documents the presence of coronary artery disease, the
severity of this disease and any potential stenosis cannot be
assessed on this non-gated CT examination. Assessment for potential
risk factor modification, dietary therapy or pharmacologic therapy
may be warranted, if clinically indicated.
6. 1.6 cm left adrenal adenoma.

## 2019-04-30 ENCOUNTER — Other Ambulatory Visit (HOSPITAL_COMMUNITY): Payer: Self-pay

## 2019-04-30 MED ORDER — POTASSIUM CHLORIDE CRYS ER 20 MEQ PO TBCR: 20.0000 meq | EXTENDED_RELEASE_TABLET | Freq: Two times a day (BID) | ORAL | 3 refills | Status: DC

## 2019-05-24 ENCOUNTER — Other Ambulatory Visit (HOSPITAL_COMMUNITY): Payer: Self-pay | Admitting: Cardiology

## 2019-05-31 ENCOUNTER — Ambulatory Visit (INDEPENDENT_AMBULATORY_CARE_PROVIDER_SITE_OTHER): Payer: Medicare Other

## 2019-05-31 DIAGNOSIS — Z95810 Presence of automatic (implantable) cardiac defibrillator: Secondary | ICD-10-CM

## 2019-05-31 DIAGNOSIS — I5042 Chronic combined systolic (congestive) and diastolic (congestive) heart failure: Secondary | ICD-10-CM | POA: Diagnosis not present

## 2019-06-04 NOTE — Progress Notes (Signed)
EPIC Encounter for ICM Monitoring  Patient Name: David Bright is a 58 y.o. male Date: 06/04/2019 Primary Care Physican: Stacie Glaze, DO Primary Cardiologist:Nelson/McLean  Electrophysiologist:Klein 03/23/2019 Weight: 301 lbs   Spoke with patient.  He is asymptomatic.  OptivolThoracic impedancenormal.  Prescribed: Furosemide40 mg Take 2 tablets (80 mg total) every morning and 1 tablet (40 mg total) every evening.  Potassium 20 mEq 1 a day twice a day.  Labs: 03/23/2019 Creatinine 1.66, BUN 52, Potassium 4.5, Sodium 136, GFR 44-52 10/15/2018 Creatinine 1.26, BUN 18, Potassium 4.2, Sodium 139, GFR >60 08/31/2018 Creatinine 1.56, BUN 29, Potassium 4.3, Sodium 138, GFR 48-56 08/21/2018 Creatinine 2.10, BUN 45, Potassium 4.9, Sodium 136, GFR 34-39 A complete set of results can be found in Results Review  Recommendations: No changes and encouraged to call if experiencing any fluid symptoms.  Follow-up plan: ICM clinic phone appointment on 07/12/2019.   91 day device clinic remote transmission 06/10/2019.  Office appt 06/23/2019 with Dr. Aundra Dubin.    Copy of ICM check sent to Dr. Caryl Comes.   3 month ICM trend: 05/31/2019    1 Year ICM trend:       [name], RN 06/04/2019 11:33 AM

## 2019-06-10 ENCOUNTER — Ambulatory Visit (INDEPENDENT_AMBULATORY_CARE_PROVIDER_SITE_OTHER): Payer: Medicare Other | Admitting: *Deleted

## 2019-06-10 DIAGNOSIS — I5043 Acute on chronic combined systolic (congestive) and diastolic (congestive) heart failure: Secondary | ICD-10-CM | POA: Diagnosis not present

## 2019-06-10 DIAGNOSIS — I428 Other cardiomyopathies: Secondary | ICD-10-CM

## 2019-06-10 LAB — CUP PACEART REMOTE DEVICE CHECK
Battery Remaining Longevity: 102 mo
Battery Voltage: 3.01 V
Brady Statistic RV Percent Paced: 0.01 %
Date Time Interrogation Session: 20201029062603
HighPow Impedance: 75 Ohm
Implantable Lead Implant Date: 20160725
Implantable Lead Location: 753860
Implantable Lead Model: 293
Implantable Lead Serial Number: 383593
Implantable Pulse Generator Implant Date: 20160725
Lead Channel Impedance Value: 342 Ohm
Lead Channel Impedance Value: 399 Ohm
Lead Channel Pacing Threshold Amplitude: 0.75 V
Lead Channel Pacing Threshold Pulse Width: 0.4 ms
Lead Channel Sensing Intrinsic Amplitude: 10.375 mV
Lead Channel Setting Pacing Amplitude: 2.5 V
Lead Channel Setting Pacing Pulse Width: 0.4 ms
Lead Channel Setting Sensing Sensitivity: 0.3 mV

## 2019-06-15 ENCOUNTER — Other Ambulatory Visit (HOSPITAL_COMMUNITY): Payer: Self-pay | Admitting: Cardiology

## 2019-06-22 ENCOUNTER — Other Ambulatory Visit (HOSPITAL_COMMUNITY): Payer: Self-pay | Admitting: Cardiology

## 2019-06-23 ENCOUNTER — Encounter (HOSPITAL_COMMUNITY): Payer: Medicare Other | Admitting: Cardiology

## 2019-06-28 ENCOUNTER — Ambulatory Visit (INDEPENDENT_AMBULATORY_CARE_PROVIDER_SITE_OTHER): Payer: Medicare Other

## 2019-06-28 ENCOUNTER — Other Ambulatory Visit: Payer: Self-pay

## 2019-06-28 DIAGNOSIS — Z95810 Presence of automatic (implantable) cardiac defibrillator: Secondary | ICD-10-CM

## 2019-06-28 DIAGNOSIS — I5042 Chronic combined systolic (congestive) and diastolic (congestive) heart failure: Secondary | ICD-10-CM
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
## 2019-07-02 ENCOUNTER — Telehealth: Payer: Self-pay

## 2019-07-02 ENCOUNTER — Ambulatory Visit (INDEPENDENT_AMBULATORY_CARE_PROVIDER_SITE_OTHER): Payer: Medicare Other

## 2019-07-02 DIAGNOSIS — Z95810 Presence of automatic (implantable) cardiac defibrillator: Secondary | ICD-10-CM

## 2019-07-02 DIAGNOSIS — I5042 Chronic combined systolic (congestive) and diastolic (congestive) heart failure: Secondary | ICD-10-CM

## 2019-07-02 NOTE — Progress Notes (Signed)
EPIC Encounter for ICM Monitoring  Patient Name: David Bright is a 58 y.o. male Date: 07/02/2019 Primary Care Physican: Stacie Glaze, DO Primary Cardiologist:Nelson/McLean  Electrophysiologist:Klein Last Weight: 301lbs       Attempted call to patient and unable to reach.  Left detailed message per DPR regarding transmission. Transmission reviewed.  Optivolthoracic impedancereturned to normal since 06/28/2019 remote transmission.  Prescribed: Furosemide40 mgTake 2 tablets (80 mg total) every morning and 1 tablet (40 mg total) every evening.Potassium 20 mEq 1 a day twice a day.  Labs: 03/23/2019 Creatinine 1.66, BUN 52, Potassium 4.5, Sodium 136, GFR 44-52 10/15/2018 Creatinine 1.26, BUN 18, Potassium 4.2, Sodium 139, GFR >60 08/31/2018 Creatinine 1.56, BUN 29, Potassium 4.3, Sodium 138, GFR 48-56 08/21/2018 Creatinine 2.10, BUN 45, Potassium 4.9, Sodium 136, GFR 34-39 A complete set of results can be found in Results Review  Recommendations:  Left voice mail with ICM number and encouraged to call if experiencing any fluid symptoms.  Follow-up plan: ICM clinic phone appointment on 07/26/2019.   91 day device clinic remote transmission 09/09/2019.  Office appt 07/13/2019 with Dr. Aundra Dubin.    Copy of ICM check sent to Dr. Caryl Comes and Dr Aundra Dubin to provide update that transmission returned to normal.   3 month ICM trend: 07/02/2019    1 Year ICM trend:       [name], RN 07/02/2019 8:43 AM

## 2019-07-02 NOTE — Telephone Encounter (Signed)
Remote ICM transmission received.  Attempted call to patient regarding ICM remote transmission and left detailed message per DPR.  Advised to return call for any fluid symptoms or questions.  

## 2019-07-03 NOTE — Progress Notes (Signed)
Remote ICD transmission.   

## 2019-07-06 ENCOUNTER — Telehealth (HOSPITAL_COMMUNITY): Payer: Self-pay | Admitting: *Deleted

## 2019-07-06 NOTE — Telephone Encounter (Signed)
Left vm requesting pt return my call about 12/1 in office visit. Offered pt teleheath visit due to covid19 precautions.

## 2019-07-10 ENCOUNTER — Other Ambulatory Visit (HOSPITAL_COMMUNITY): Payer: Self-pay | Admitting: Cardiology

## 2019-07-13 ENCOUNTER — Ambulatory Visit (HOSPITAL_COMMUNITY)
Admission: RE | Admit: 2019-07-13 | Discharge: 2019-07-13 | Disposition: A | Discharge status: Home / Self Care | Payer: Medicare Other | Source: Ambulatory Visit | Attending: Cardiology | Admitting: Cardiology

## 2019-07-13 ENCOUNTER — Other Ambulatory Visit: Payer: Self-pay

## 2019-07-13 DIAGNOSIS — I5042 Chronic combined systolic (congestive) and diastolic (congestive) heart failure: Secondary | ICD-10-CM | POA: Diagnosis not present

## 2019-07-13 MED ORDER — CARVEDILOL 6.25 MG PO TABS: 6.2500 mg | ORAL_TABLET | Freq: Two times a day (BID) | ORAL | 3 refills | Status: DC

## 2019-07-13 NOTE — Patient Instructions (Signed)
Lab work on Thursday 07/15/2019  Your physician recommends that you schedule a follow-up appointment in: 3 months with echocardiogram  If you have any questions or concerns before your next appointment please send [place] a message through [place] or call our office at [phone].  At [facility], you and your health needs are our priority. As part of our continuing mission to provide you with exceptional heart care, we have created designated Provider Care Teams. These Care Teams include your primary Cardiologist (physician) and Advanced Practice Providers (APPs- Physician Assistants and Nurse Practitioners) who all work together to provide you with the care you need, when you need it.   You may see any of the following providers on your designated Care Team at your next follow up: Marland Kitchen Dr Glori Bickers . Dr Loralie Champagne . Darrick Grinder, NP . Lyda Jester, PA . Audry Riles, PharmD   Please be sure to bring in all your medications bottles to every appointment.

## 2019-07-13 NOTE — Progress Notes (Signed)
Orders per Dr Aundra Dubin:  1. bmet soon 2. F/u with echo in 3 months 3. Refill carvedilol  Called and spoke w/pt, he is aware, agreeable and verbalizes understanding.  Refill sent in, appt scheduled, AVS and appt cards mailed to pt

## 2019-07-15 ENCOUNTER — Other Ambulatory Visit: Payer: Self-pay

## 2019-07-15 ENCOUNTER — Ambulatory Visit (HOSPITAL_COMMUNITY)
Admission: RE | Admit: 2019-07-15 | Discharge: 2019-07-15 | Disposition: A | Discharge status: Home / Self Care | Payer: Medicare Other | Source: Ambulatory Visit | Attending: Cardiology | Admitting: Cardiology

## 2019-07-15 DIAGNOSIS — I5042 Chronic combined systolic (congestive) and diastolic (congestive) heart failure: Secondary | ICD-10-CM | POA: Diagnosis not present

## 2019-07-15 LAB — BASIC METABOLIC PANEL
Anion gap: 14 (ref 5–15)
BUN: 39 mg/dL — ABNORMAL HIGH (ref 6–20)
CO2: 21 mmol/L — ABNORMAL LOW (ref 22–32)
Calcium: 8.8 mg/dL — ABNORMAL LOW (ref 8.9–10.3)
Chloride: 95 mmol/L — ABNORMAL LOW (ref 98–111)
Creatinine, Ser: 1.86 mg/dL — ABNORMAL HIGH (ref 0.61–1.24)
GFR calc Af Amer: 45 mL/min — ABNORMAL LOW (ref >60)
GFR calc non Af Amer: 39 mL/min — ABNORMAL LOW (ref >60)
Glucose, Bld: 544 mg/dL (ref 70–99)
Potassium: 5.1 mmol/L (ref 3.5–5.1)
Sodium: 130 mmol/L — ABNORMAL LOW (ref 135–145)

## 2019-07-15 NOTE — Progress Notes (Signed)
Heart Failure TeleHealth Note  Due to national recommendations of social distancing due to [place] 19, Audio/video telehealth visit is felt to be most appropriate for this patient at this time.  See MyChart message from today for patient consent regarding telehealth for [name]  Date:  07/15/2019   ID:  David Bright, DOB [birth year], MRN [id number]  Location: Home  Provider location: [place] [facility] Type of Visit: Established patient   PCP:  Stacie Glaze, DO  HF Cardiology: Dr. Aundra Dubin  Chief Complaint: Fatigue   History of Present Illness: David Bright is a 58 y.o. male who presents via audio/video conferencing for a telehealth visit today.     he denies symptoms worrisome for COVID 19.   Patient has a history of chronic systolic CHF and COPD/active smoking presents.  He has a known nonischemic cardiomyopathy, echo in 1/17 with EF 20% and a severely dilated LV.  No coronary disease on 7/16 cardiac cath.  Repeat echo was 2/18, showing EF up to 40%.   He had significant PAD by peripheral arterial dopplers, saw Dr. Gwenlyn Found with plan of medical management for now. He also has knee pain which is limiting.   Echo 09/2017: EF up to 50-55% with mild LVH. Echo in 1/20 showed EF 55-60%.   He has been stable symptomatically.  No significant exertional dyspnea.  No orthopnea/PND.  No chest pain.  He has been eating poorly recently and Optivol showed decreased thoracic impedance.  However, it has come back up to normal. He is using Chantix and is down to 1-2 cigarettes/day.   Labs (12/17): K 4.3, creatinine 1.09 Labs (1/18): K 3.9, creatinine 1.13 Labs (3/18): K 4.3, creatinine 1.48, NT-proBNP 61 Labs (4/18): K 4.5, creatinine 1.23, LDL 71, BNP 34 Labs (5/18): K 4.5, creatinine 1.2 Labs (1/19): K 4.2, creatinine 1.19, LDL 69, HDL 39 Labs (11/19): LDL 66, HDL 34, K 3.3, creatinine 1.19, BNP 45 Labs (12/19): K 4.4, creatinine 1.59 Labs (8/20): K 4.5, creatinine  1.66  PMH:  1. Chronic systolic CHF: Nonischemic cardiomyopathy.  Has Medtronic ICD.   - LHC (7/16) with nonobstructive disease.  - Echo (1/17) with EF 20%, severe LV dilation, mild to moderate MR.  - Echo (2/18): EF 40%, mild LV dilation.  - Echo (5/19): EF 50-55%, mild LVH - Echo (1/20): EF 55-60%, mild LVH, normal RV size and systolic function.  2. COPD: Active smoker.  - PFTs (2/18) with minimal obstruction, mild restriction suggesting a parenchymal process, low DLCO.  - CT chest (6/18): No ILD, +emphysema, lung nodules.  - CT chest (11/19): Emphysema, no ILD, stable nodules 3. Type II diabetes with peripheral neuropathy.  4. OSA on CPAP 5. HTN 6. Hyperlipidemia 7. PAD: ABIs abnormal at outside office.  - Peripheral arterial dopplers (3/18) with > 50% left CIA and bilateral EIA stenosis, 50-74% mid LSFA stenosis.  - ABIs (2/19): 0.85 right, 0.86 left (mild).   SH: Lives in [place], works at [employer] and [employer], smokes about 4-5 cigs/day now.  Rare ETOH.  Remote substance abuse.   FH: Aunt with CHF, brother with ESRD.    Review of systems complete and found to be negative unless listed in HPI.   Current Outpatient Medications  Medication Sig Dispense Refill  . aspirin EC 81 MG tablet Take 1 tablet (81 mg total) by mouth daily. 30 tablet 0  . atorvastatin (LIPITOR) 20 MG tablet Take 1 tablet (20 mg total) by mouth daily. 30 tablet 3  .  BIDIL 20-37.5 MG tablet TAKE 1 TABLET BY MOUTH THREE TIMES DAILY 90 tablet 0  . carvedilol (COREG) 6.25 MG tablet Take 1 tablet (6.25 mg total) by mouth 2 (two) times daily with a meal. 180 tablet 3  . CORLANOR 5 MG TABS tablet TAKE 1 TABLET BY MOUTH TWICE DAILY WITH MEALS 60 tablet 0  . ENTRESTO 97-103 MG Take 1 tablet by mouth twice daily 60 tablet 0  . furosemide (LASIX) 40 MG tablet Take 2 tablets (80 mg total) every morning and 1 tablet (40 mg total) every evening. 270 tablet 2  . mesalamine (LIALDA) 1.2 g EC tablet Take 1.2 g 2 (two) times  daily by mouth.    . potassium chloride SA (K-DUR) 20 MEQ tablet Take 1 tablet (20 mEq total) by mouth 2 (two) times daily. 180 tablet 3  . sitaGLIPtin-metformin (JANUMET) 50-1000 MG tablet Take 1 tablet by mouth 2 (two) times daily with a meal.    . spironolactone (ALDACTONE) 25 MG tablet Take 1 tablet (25 mg total) by mouth daily. 90 tablet 3   No current facility-administered medications for this encounter.    Exam:  (Video/Tele Health Call; Exam is subjective and or/visual.) General:  Speaks in full sentences. No resp difficulty. Lungs: Normal respiratory effort with conversation.  Abdomen: Non-distended per patient report Extremities: Pt denies edema. Neuro: Alert & oriented x 3.   Assessment/Plan: 1. Chronic systolic CHF: Nonischemic cardiomyopathy => ?due to viral myocarditis or prior substance abuse.  Medtronic ICD.  Echo 1/17 with severely dilated LV and EF 20%, EF up to 40% on echo 2/18.  Echo in 1/20 showed EF up to 55% with normal RV.  NYHA class II symptoms recently.   - Continue current Lasix dosing. I will arrange for a BMET.  - Continue spironolactone 25 mg daily, Entresto 97/103 bid, Bidil 1 tab tid, Coreg 6.25 mg bid.  - I will arrange for an echo at followup in 3 months to make sure EF remains in normal range.  - Low sodium diet.  2. Smoking/suspect COPD:  PFTs done, somewhat concerning for interstitial lung disease rather than emphysema with restrictive PFTs and decreased DLCO => no ILD on CT, emphysema noted along with lung nodules. Most recent repeat CT in 11/19 showed stable nodules, do not need repeat. He continues to smoke 1-2 cigarettes/day.  - Continue Chantix, it seems to be working slowly.  3. OSA: Continue CPAP. No change.  4. PAD: Significant disease on peripheral dopplers, stable mild claudication. He saw Dr. Gwenlyn Found, medical management recommended.  I encouraged him to try to walk at least a short distance through the pain.  - Continue ASA 81 and statin. Needs  repeat lipids.  - Repeat ABIs due and will need followup with Dr. Gwenlyn Found.   COVID screen The patient does not have any symptoms that suggest any further testing/ screening at this time.  Social distancing reinforced today.  Patient Risk: After full review of this patients clinical status, I feel that they are at moderate risk for cardiac decompensation at this time.  Relevant cardiac medications were reviewed at length with the patient today. The patient does not have concerns regarding their medications at this time.   Recommended follow-up:  3 months  Today, I have spent 16 minutes with the patient with telehealth technology discussing the above issues .    Signed, Loralie Champagne, MD  07/15/2019  [facility] [place]  [id number] [phone] (office) [phone] (fax)

## 2019-07-19 ENCOUNTER — Telehealth (HOSPITAL_COMMUNITY): Payer: Self-pay

## 2019-07-19 NOTE — Telephone Encounter (Signed)
Pt called stating he was d/cd from hospital on Saturday and was told to call for an appt. Appt made for Next Monday with NP/PA clinic. Pt verbalized understanding.

## 2019-07-20 ENCOUNTER — Other Ambulatory Visit (HOSPITAL_COMMUNITY): Payer: Self-pay | Admitting: Cardiology

## 2019-07-26 ENCOUNTER — Encounter (HOSPITAL_COMMUNITY): Payer: Self-pay

## 2019-07-26 ENCOUNTER — Other Ambulatory Visit: Payer: Self-pay

## 2019-07-26 ENCOUNTER — Ambulatory Visit (INDEPENDENT_AMBULATORY_CARE_PROVIDER_SITE_OTHER): Payer: Medicare Other

## 2019-07-26 ENCOUNTER — Ambulatory Visit (HOSPITAL_COMMUNITY)
Admission: RE | Admit: 2019-07-26 | Discharge: 2019-07-26 | Disposition: A | Discharge status: Home / Self Care | Payer: Medicare Other | Source: Ambulatory Visit | Attending: Adult Health | Admitting: Adult Health

## 2019-07-26 VITALS — BP 126/73 | HR 86 | Wt 296.8 lb

## 2019-07-26 DIAGNOSIS — I739 Peripheral vascular disease, unspecified: Secondary | ICD-10-CM | POA: Diagnosis not present

## 2019-07-26 DIAGNOSIS — M25569 Pain in unspecified knee: Secondary | ICD-10-CM | POA: Diagnosis not present

## 2019-07-26 DIAGNOSIS — G4733 Obstructive sleep apnea (adult) (pediatric): Secondary | ICD-10-CM | POA: Diagnosis not present

## 2019-07-26 DIAGNOSIS — I5022 Chronic systolic (congestive) heart failure: Secondary | ICD-10-CM | POA: Insufficient documentation

## 2019-07-26 DIAGNOSIS — I110 Hypertensive heart disease with heart failure: Secondary | ICD-10-CM | POA: Insufficient documentation

## 2019-07-26 DIAGNOSIS — Z720 Tobacco use: Secondary | ICD-10-CM | POA: Diagnosis not present

## 2019-07-26 DIAGNOSIS — E1151 Type 2 diabetes mellitus with diabetic peripheral angiopathy without gangrene: Secondary | ICD-10-CM | POA: Insufficient documentation

## 2019-07-26 DIAGNOSIS — F17210 Nicotine dependence, cigarettes, uncomplicated: Secondary | ICD-10-CM | POA: Diagnosis not present

## 2019-07-26 DIAGNOSIS — Z7901 Long term (current) use of anticoagulants: Secondary | ICD-10-CM | POA: Insufficient documentation

## 2019-07-26 DIAGNOSIS — I5042 Chronic combined systolic (congestive) and diastolic (congestive) heart failure: Secondary | ICD-10-CM | POA: Diagnosis not present

## 2019-07-26 DIAGNOSIS — I428 Other cardiomyopathies: Secondary | ICD-10-CM | POA: Insufficient documentation

## 2019-07-26 DIAGNOSIS — E1142 Type 2 diabetes mellitus with diabetic polyneuropathy: Secondary | ICD-10-CM | POA: Insufficient documentation

## 2019-07-26 DIAGNOSIS — E785 Hyperlipidemia, unspecified: Secondary | ICD-10-CM | POA: Insufficient documentation

## 2019-07-26 DIAGNOSIS — Z9989 Dependence on other enabling machines and devices: Secondary | ICD-10-CM

## 2019-07-26 DIAGNOSIS — Z7982 Long term (current) use of aspirin: Secondary | ICD-10-CM | POA: Insufficient documentation

## 2019-07-26 DIAGNOSIS — Z7984 Long term (current) use of oral hypoglycemic drugs: Secondary | ICD-10-CM | POA: Diagnosis not present

## 2019-07-26 DIAGNOSIS — Z95810 Presence of automatic (implantable) cardiac defibrillator: Secondary | ICD-10-CM

## 2019-07-26 DIAGNOSIS — Z79899 Other long term (current) drug therapy: Secondary | ICD-10-CM | POA: Insufficient documentation

## 2019-07-26 LAB — BASIC METABOLIC PANEL
Anion gap: 11 (ref 5–15)
BUN: 25 mg/dL — ABNORMAL HIGH (ref 6–20)
CO2: 25 mmol/L (ref 22–32)
Calcium: 9.2 mg/dL (ref 8.9–10.3)
Chloride: 103 mmol/L (ref 98–111)
Creatinine, Ser: 1.27 mg/dL — ABNORMAL HIGH (ref 0.61–1.24)
GFR calc Af Amer: >60 mL/min (ref >60)
GFR calc non Af Amer: >60 mL/min (ref >60)
Glucose, Bld: 185 mg/dL — ABNORMAL HIGH (ref 70–99)
Potassium: 4.4 mmol/L (ref 3.5–5.1)
Sodium: 139 mmol/L (ref 135–145)

## 2019-07-26 LAB — BRAIN NATRIURETIC PEPTIDE: B Natriuretic Peptide: 45.1 pg/mL (ref 0.0–100.0)

## 2019-07-26 MED ORDER — FUROSEMIDE 80 MG PO TABS: 80.0000 mg | ORAL_TABLET | Freq: Two times a day (BID) | ORAL | 6 refills | Status: DC

## 2019-07-26 MED ORDER — FUROSEMIDE 80 MG PO TABS: 80.0000 mg | ORAL_TABLET | Freq: Every day | ORAL | 6 refills | Status: DC

## 2019-07-26 NOTE — Progress Notes (Signed)
EPIC Encounter for ICM Monitoring  Patient Name: David Bright is a 58 y.o. male Date: 07/26/2019 Primary Care Physican: Stacie Glaze, DO Primary Cardiologist:Nelson/McLean  Electrophysiologist:Klein 12/14/2020Weight: 297.6lbs   OBSERVATIONS (4)  (02-Jul-2019 to 26-Jul-2019)   1 V. Oversensing-TWave episodes detected.  TriageHFT (HF Risk) Alert: High (New)  RV Capture Management: Actual safety margin (3.3 X) > programmed margin (2 X).  VF detection may be delayed: VF Detection Interval is faster than 300 ms (200 bpm).   Since 02-Jul-2019  ?    9 VT-NS   Spoke with patient and he reports he is doing well.  Reviewed foods that are high in salt and encouraged to read food labels for salt content.  Patient has office visit with HF clinic PA/NP on 07/26/2019.  Optivolthoracic impedancesuggesting possible ongoing fluid accumulation since 07/19/2019.  Prescribed: Furosemide40 mgTake 2 tablets (80 mg total) every morning and 1 tablet (40 mg total) every evening.Potassium 20 mEq 1 a day twice a day.  Labs: 07/15/2019 Creatinine 1.86, BUN 39, Potassium 5.1, Sodium 130, GFR 39-45 03/23/2019 Creatinine 1.66, BUN 52, Potassium 4.5, Sodium 136, GFR 44-52 10/15/2018 Creatinine 1.26, BUN 18, Potassium 4.2, Sodium 139, GFR >60 08/31/2018 Creatinine 1.56, BUN 29, Potassium 4.3, Sodium 138, GFR 48-56 08/21/2018 Creatinine 2.10, BUN 45, Potassium 4.9, Sodium 136, GFR 34-39 A complete set of results can be found in Results Review  Recommendations: Recommendations will be given today, 07/26/2019, at office visit appointment with Darrick Grinder, PA in HF clinic.  Follow-up plan: ICM clinic phone appointment on 08/02/2019 (manual send) to recheck fluid levels.   91 day device clinic remote transmission 09/09/2019.  Office appt 07/26/2019 with HF clinic PA/NP.    Copy of ICM check sent to Dr. Caryl Comes and Darrick Grinder, PA since patient has visit scheduled at 2:30 with her  today.    3 month ICM trend: 07/26/2019    1 Year ICM trend:       [name], RN 07/26/2019 10:47 AM
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
## 2019-07-26 NOTE — Patient Instructions (Addendum)
INCREASE Lasix to 80 mg one tab twice daily for 2 days, then 80 mg daily thereafter  Labs today We will only contact you if something comes back abnormal or we need to make some changes. Otherwise no news is good news!  Your physician recommends that you schedule a follow-up appointment in: 4 weeks  in the Advanced Practitioners (PA/NP) Clinic   Do the following things EVERYDAY: 1) Weigh yourself in the morning before breakfast. Write it down and keep it in a log. 2) Take your medicines as prescribed 3) Eat low salt foods--Limit salt (sodium) to 2000 mg per day.  4) Stay as active as you can everyday 5) Limit all fluids for the day to less than 2 liters  At the [facility], you and your health needs are our priority. As part of our continuing mission to provide you with exceptional heart care, we have created designated Provider Care Teams. These Care Teams include your primary Cardiologist (physician) and Advanced Practice Providers (APPs- Physician Assistants and Nurse Practitioners) who all work together to provide you with the care you need, when you need it.   You may see any of the following providers on your designated Care Team at your next follow up: Marland Kitchen Dr Glori Bickers . Dr Loralie Champagne . Darrick Grinder, NP . Lyda Jester, PA . Audry Riles, PharmD   Please be sure to bring in all your medications bottles to every appointment.

## 2019-08-02 ENCOUNTER — Ambulatory Visit (INDEPENDENT_AMBULATORY_CARE_PROVIDER_SITE_OTHER): Payer: Medicare Other

## 2019-08-02 DIAGNOSIS — Z95810 Presence of automatic (implantable) cardiac defibrillator: Secondary | ICD-10-CM

## 2019-08-02 DIAGNOSIS — I5042 Chronic combined systolic (congestive) and diastolic (congestive) heart failure: Secondary | ICD-10-CM

## 2019-08-03 ENCOUNTER — Telehealth: Payer: Self-pay

## 2019-08-03 DIAGNOSIS — I5042 Chronic combined systolic (congestive) and diastolic (congestive) heart failure: Secondary | ICD-10-CM

## 2019-08-03 NOTE — Telephone Encounter (Signed)
Unable to speak  with patient to remind of missed remote transmission 

## 2019-08-03 NOTE — Telephone Encounter (Signed)
Increase Lasix to 80 qam/40 qpm. BMET 1 week.

## 2019-08-03 NOTE — Progress Notes (Signed)
EPIC Encounter for ICM Monitoring  Patient Name: David Bright is a 58 y.o. male Date: 08/03/2019 Primary Care Physican: Stacie Glaze, DO Primary Cardiologist:Nelson/McLean  Electrophysiologist:Klein 08/03/2019 Weight: 295.9 lbs   Spoke with patient and he reports weight gain of 4 lbs within the last 24 hours.  Patient unsure if he is adhering to low salt diet.        He confirms taking Furosemide 80 mg bid x 2 days as instructed by Darrick Grinder, PA at 12/14 office visit and then taking 80 mg daily thereafter.   Optivolthoracic impedancecontinues suggesting possible ongoing fluid accumulation since 07/19/2019 even after 2 days of extra Furosemide 12/15 & 12/16  Prescribed: Furosemide40 mgTake 2 tablets (80 mg total) every morning.Potassium 20 mEq 1 a day twice a day.  Labs: 07/26/2019 Creatinine 1.27, BUN 25, Potassium 4.4, Sodium 139, GFR >60, BNP 45.1 07/15/2019 Creatinine 1.86, BUN 39, Potassium 5.1, Sodium 130, GFR 39-45 03/23/2019 Creatinine 1.66, BUN 52, Potassium 4.5, Sodium 136, GFR 44-52 10/15/2018 Creatinine 1.26, BUN 18, Potassium 4.2, Sodium 139, GFR >60 08/31/2018 Creatinine 1.56, BUN 29, Potassium 4.3, Sodium 138, GFR 48-56 08/21/2018 Creatinine 2.10, BUN 45, Potassium 4.9, Sodium 136, GFR 34-39 A complete set of results can be found in Results Review  Recommendations: Phone note sent to Dr Aundra Dubin for review and recommendations if needed.  Follow-up plan: ICM clinic phone appointment on 08/09/2019 (manual send) to recheck fluid levels.   91 day device clinic remote transmission 09/09/2019.  Office appt 08/24/2019 with HF clinic PA/NP.    3 month ICM trend: 08/03/2019    1 Year ICM trend:       [name], RN 08/03/2019 12:56 PM

## 2019-08-03 NOTE — Telephone Encounter (Signed)
ICM call to patient.  He reports weight gain of 4 lbs within the last 24 hours.  Patient unsure if he is adhering to low salt diet.        He confirms taking Furosemide 80 mg bid x 2 days as instructed by Darrick Grinder, PA at 12/14 office visit and then taking 80 mg daily thereafter.   Optivolthoracic impedancecontinues suggesting possible ongoing fluid accumulation since 07/19/2019 even after 2 days of extra Furosemide 12/15 & 12/16  Prescribed: Furosemide40 mgTake 2 tablets (80 mg total) every morning.Potassium 20 mEq 1 a day twice a day.  Labs: 07/26/2019 Creatinine 1.27, BUN 25, Potassium 4.4, Sodium 139, GFR >60, BNP 45.1 07/15/2019 Creatinine 1.86, BUN 39, Potassium 5.1, Sodium 130, GFR 39-45 03/23/2019 Creatinine 1.66, BUN 52, Potassium 4.5, Sodium 136, GFR 44-52 10/15/2018 Creatinine 1.26, BUN 18, Potassium 4.2, Sodium 139, GFR >60 08/31/2018 Creatinine 1.56, BUN 29, Potassium 4.3, Sodium 138, GFR 48-56 08/21/2018 Creatinine 2.10, BUN 45, Potassium 4.9, Sodium 136, GFR 34-39 A complete set of results can be found in Results Review  Recommendations: Phone note sent to Dr Aundra Dubin for review and recommendations if needed.  Follow-up plan: ICM clinic phone appointment on 08/09/2019 (manual send) to recheck fluid levels.   Office appt 08/24/2019 with HF clinic PA/NP.    3 month ICM trend: 08/03/2019    1 Year ICM trend:

## 2019-08-04 MED ORDER — FUROSEMIDE 40 MG PO TABS: ORAL_TABLET | ORAL | 2 refills | Status: DC

## 2019-08-04 NOTE — Progress Notes (Signed)
Call to patient.  Advised Dr Aundra Dubin ordered to take Furosemide 80 mg every morning and 40 mg every afternoon.  He has supply on hand and does not need a refill at this time.  He agreed to labs to be drawn on 12/30 at 11:30 AM at HF clinic. Remote transmission scheduled for 12/28.  (See ICM phone note for orders)

## 2019-08-04 NOTE — Telephone Encounter (Signed)
Call to patient.  Advised Dr Aundra Dubin ordered to take Furosemide 80 mg every morning and 40 mg every afternoon.  He has supply on hand and does not need a refill at this time.  He agreed to labs to be drawn on 12/30 at 11:30 AM at HF clinic. Remote transmission scheduled for 12/28.

## 2019-08-04 NOTE — Progress Notes (Signed)
Larey Dresser, MD  Physician  Specialty:  Cardiology  Telephone Encounter  Signed  Creation Time:  08/03/2019 8:21 PM          Signed        Increase Lasix to 80 qam/40 qpm. BMET 1 week.

## 2019-08-09 ENCOUNTER — Ambulatory Visit (INDEPENDENT_AMBULATORY_CARE_PROVIDER_SITE_OTHER): Payer: Medicare Other

## 2019-08-09 DIAGNOSIS — Z95810 Presence of automatic (implantable) cardiac defibrillator: Secondary | ICD-10-CM

## 2019-08-09 DIAGNOSIS — I5042 Chronic combined systolic (congestive) and diastolic (congestive) heart failure: Secondary | ICD-10-CM

## 2019-08-10 NOTE — Progress Notes (Signed)
EPIC Encounter for ICM Monitoring  Patient Name: David Bright is a 58 y.o. male Date: 08/10/2019 Primary Care Physican: Stacie Glaze, DO Primary Cardiologist:Nelson/McLean  Electrophysiologist:Klein 08/03/2019 Weight: 295.9 lbs 08/09/2019 Weight: 292.5 lbs 08/10/2019 Weight: 289.9 lbs   Spoke with patient and weight has returned to baseline.  He reports trying to follow a low salt diet but list foods that are not low salt.         Optivolthoracic impedancehas returned close to baseline normal.  Prescribed: Furosemide40 mgTake 2 tablets (80 mg total) every morning and 1 tablet (40 mg total) every evening (increased on 12/23).Potassium 20 mEq 1 a day twice a day.  Labs: BMET scheduled for 12/30 07/26/2019 Creatinine 1.27, BUN 25, Potassium 4.4, Sodium 139, GFR >60, BNP 45.1 07/15/2019 Creatinine 1.86, BUN 39, Potassium 5.1, Sodium 130, GFR 39-45 03/23/2019 Creatinine 1.66, BUN 52, Potassium 4.5, Sodium 136, GFR 44-52 10/15/2018 Creatinine 1.26, BUN 18, Potassium 4.2, Sodium 139, GFR >60 08/31/2018 Creatinine 1.56, BUN 29, Potassium 4.3, Sodium 138, GFR 48-56 08/21/2018 Creatinine 2.10, BUN 45, Potassium 4.9, Sodium 136, GFR 34-39 A complete set of results can be found in Results Review  Recommendations: Recommendation to limit salt intake to 2000 mg daily and fluid intake to 64 oz daily.  Encouraged to call if experiencing any fluid symptoms.   Follow-up plan: ICM clinic phone appointment on1/18/2021. 91 day device clinic remote transmission 09/09/2019. Office appt1/12/2021with HF clinicPA/NP.   Copy of ICM check sent to Dr. Caryl Comes  3 month ICM trend: 08/09/2019    1 Year ICM trend:       [name], RN 08/10/2019 1:53 PM

## 2019-08-11 ENCOUNTER — Other Ambulatory Visit (HOSPITAL_COMMUNITY): Payer: Medicare Other

## 2019-08-15 ENCOUNTER — Other Ambulatory Visit (HOSPITAL_COMMUNITY): Payer: Self-pay | Admitting: Cardiology

## 2019-08-16 ENCOUNTER — Other Ambulatory Visit: Payer: Self-pay

## 2019-08-16 ENCOUNTER — Ambulatory Visit (HOSPITAL_COMMUNITY)
Admission: RE | Admit: 2019-08-16 | Discharge: 2019-08-16 | Disposition: A | Discharge status: Home / Self Care | Payer: Medicare Other | Source: Ambulatory Visit | Attending: Cardiology | Admitting: Cardiology

## 2019-08-16 ENCOUNTER — Telehealth (HOSPITAL_COMMUNITY): Payer: Self-pay

## 2019-08-16 DIAGNOSIS — I5042 Chronic combined systolic (congestive) and diastolic (congestive) heart failure: Secondary | ICD-10-CM

## 2019-08-16 LAB — BASIC METABOLIC PANEL
Anion gap: 12 (ref 5–15)
BUN: 48 mg/dL — ABNORMAL HIGH (ref 6–20)
CO2: 23 mmol/L (ref 22–32)
Calcium: 8.9 mg/dL (ref 8.9–10.3)
Chloride: 100 mmol/L (ref 98–111)
Creatinine, Ser: 1.95 mg/dL — ABNORMAL HIGH (ref 0.61–1.24)
GFR calc Af Amer: 42 mL/min — ABNORMAL LOW (ref >60)
GFR calc non Af Amer: 37 mL/min — ABNORMAL LOW (ref >60)
Glucose, Bld: 230 mg/dL — ABNORMAL HIGH (ref 70–99)
Potassium: 4.8 mmol/L (ref 3.5–5.1)
Sodium: 135 mmol/L (ref 135–145)

## 2019-08-16 MED ORDER — FUROSEMIDE 40 MG PO TABS: 40.0000 mg | ORAL_TABLET | Freq: Every day | ORAL | 2 refills | Status: DC

## 2019-08-16 NOTE — Telephone Encounter (Signed)
Pt aware will decrease lasix to 80mg  daily. Has f/u appt on 1/12 with NP. Will repeat bmet at visit.  Pt verbalized understanding

## 2019-08-16 NOTE — Telephone Encounter (Signed)
-----   Message from Larey Dresser, MD sent at 08/16/2019 12:02 PM EST ----- If he is taking Lasix 80 mg daily, decrease to 80 mg daily alternating with 40 mg daily.  BMET 1 week.

## 2019-08-23 ENCOUNTER — Telehealth (HOSPITAL_COMMUNITY): Payer: Self-pay | Admitting: Cardiology

## 2019-08-23 NOTE — Telephone Encounter (Signed)
ATTEMPTED TO CONTACT PATIENT FIR COVID SCREENING  NO ANSWER, UNABLE TO LEAVE MESSAGE

## 2019-08-24 ENCOUNTER — Encounter (HOSPITAL_COMMUNITY): Payer: Self-pay

## 2019-08-24 ENCOUNTER — Other Ambulatory Visit: Payer: Self-pay

## 2019-08-24 ENCOUNTER — Ambulatory Visit (HOSPITAL_COMMUNITY)
Admission: RE | Admit: 2019-08-24 | Discharge: 2019-08-24 | Disposition: A | Discharge status: Home / Self Care | Payer: Medicare Other | Source: Ambulatory Visit | Attending: Adult Health | Admitting: Adult Health

## 2019-08-24 VITALS — BP 144/96 | HR 85 | Wt 296.0 lb

## 2019-08-24 DIAGNOSIS — E1142 Type 2 diabetes mellitus with diabetic polyneuropathy: Secondary | ICD-10-CM | POA: Insufficient documentation

## 2019-08-24 DIAGNOSIS — Z720 Tobacco use: Secondary | ICD-10-CM

## 2019-08-24 DIAGNOSIS — J449 Chronic obstructive pulmonary disease, unspecified: Secondary | ICD-10-CM | POA: Insufficient documentation

## 2019-08-24 DIAGNOSIS — I739 Peripheral vascular disease, unspecified: Secondary | ICD-10-CM | POA: Diagnosis not present

## 2019-08-24 DIAGNOSIS — I5042 Chronic combined systolic (congestive) and diastolic (congestive) heart failure: Secondary | ICD-10-CM | POA: Diagnosis not present

## 2019-08-24 DIAGNOSIS — Z7982 Long term (current) use of aspirin: Secondary | ICD-10-CM | POA: Diagnosis not present

## 2019-08-24 DIAGNOSIS — E785 Hyperlipidemia, unspecified: Secondary | ICD-10-CM | POA: Diagnosis not present

## 2019-08-24 DIAGNOSIS — Z7984 Long term (current) use of oral hypoglycemic drugs: Secondary | ICD-10-CM | POA: Diagnosis not present

## 2019-08-24 DIAGNOSIS — I428 Other cardiomyopathies: Secondary | ICD-10-CM | POA: Diagnosis not present

## 2019-08-24 DIAGNOSIS — I110 Hypertensive heart disease with heart failure: Secondary | ICD-10-CM | POA: Insufficient documentation

## 2019-08-24 DIAGNOSIS — M25569 Pain in unspecified knee: Secondary | ICD-10-CM | POA: Insufficient documentation

## 2019-08-24 DIAGNOSIS — I5022 Chronic systolic (congestive) heart failure: Secondary | ICD-10-CM | POA: Insufficient documentation

## 2019-08-24 DIAGNOSIS — F17210 Nicotine dependence, cigarettes, uncomplicated: Secondary | ICD-10-CM | POA: Diagnosis not present

## 2019-08-24 DIAGNOSIS — Z9989 Dependence on other enabling machines and devices: Secondary | ICD-10-CM

## 2019-08-24 DIAGNOSIS — Z79899 Other long term (current) drug therapy: Secondary | ICD-10-CM | POA: Diagnosis not present

## 2019-08-24 DIAGNOSIS — Z8249 Family history of ischemic heart disease and other diseases of the circulatory system: Secondary | ICD-10-CM | POA: Diagnosis not present

## 2019-08-24 DIAGNOSIS — G4733 Obstructive sleep apnea (adult) (pediatric): Secondary | ICD-10-CM | POA: Diagnosis not present

## 2019-08-24 LAB — BASIC METABOLIC PANEL
Anion gap: 9 (ref 5–15)
BUN: 29 mg/dL — ABNORMAL HIGH (ref 6–20)
CO2: 24 mmol/L (ref 22–32)
Calcium: 9.5 mg/dL (ref 8.9–10.3)
Chloride: 106 mmol/L (ref 98–111)
Creatinine, Ser: 1.21 mg/dL (ref 0.61–1.24)
GFR calc Af Amer: >60 mL/min (ref >60)
GFR calc non Af Amer: >60 mL/min (ref >60)
Glucose, Bld: 145 mg/dL — ABNORMAL HIGH (ref 70–99)
Potassium: 4.6 mmol/L (ref 3.5–5.1)
Sodium: 139 mmol/L (ref 135–145)

## 2019-08-24 MED ORDER — FUROSEMIDE 40 MG PO TABS: ORAL_TABLET | ORAL | 3 refills | Status: DC

## 2019-08-24 NOTE — Progress Notes (Signed)
Advanced Heart Failure  Date:  07/15/2019   ID:  David Bright, DOB 03/16/1961, MRN [id number]  Location: Home  Provider location: [place] [facility] Type of Visit: Established patient   PCP:  Stacie Glaze, DO  HF Cardiology: Dr. Aundra Dubin  Chief Complaint: Heart Failure    History of Present Illness: David Bright is a 59 y.o. with history of  chronic systolic CHF and COPD/active smoking presents.  He has a known nonischemic cardiomyopathy, echo in 1/17 with EF 20% and a severely dilated LV.  No coronary disease on 7/16 cardiac cath.  Repeat echo was 2/18, showing EF up to 40%.   He had significant PAD by peripheral arterial dopplers, saw Dr. Gwenlyn Found with plan of medical management for now. He also has knee pain which is limiting.   Echo 09/2017: EF up to 50-55% with mild LVH. Echo in 1/20 showed EF 55-60%.   Today he returns for HF follow up.Overall feeling fine. Remains SOB with exertion. Says he is ok if he walks slow. Denies PND/Orthopnea. Using CPAP every night. Appetite ok. Eating high salt foods and drinking < 2 liters. No fever or chills. Weight at home 390-395 pounds. Taking all medications. Smoking 4-5 cigarettes per day.    Optivol 08/24/19 - Fluid index elevated. Impedance down. Activity > 2 hours per day. No VT/AF   Labs (12/17): K 4.3, creatinine 1.09 Labs (1/18): K 3.9, creatinine 1.13 Labs (3/18): K 4.3, creatinine 1.48, NT-proBNP 61 Labs (4/18): K 4.5, creatinine 1.23, LDL 71, BNP 34 Labs (5/18): K 4.5, creatinine 1.2 Labs (1/19): K 4.2, creatinine 1.19, LDL 69, HDL 39 Labs (11/19): LDL 66, HDL 34, K 3.3, creatinine 1.19, BNP 45 Labs (12/19): K 4.4, creatinine 1.59 Labs (8/20): K 4.5, creatinine 1.66 Labs 07/15/19:  K 5.1 Creatinine 1.86.  Labs 07/26/19: K 4.4 Creatinine 1.27  Labs 08/16/19: K 4.8 Creatinine 1.95  PMH:  1. Chronic systolic CHF: Nonischemic cardiomyopathy.  Has Medtronic ICD.   - LHC (7/16) with nonobstructive disease.  - Echo  (1/17) with EF 20%, severe LV dilation, mild to moderate MR.  - Echo (2/18): EF 40%, mild LV dilation.  - Echo (5/19): EF 50-55%, mild LVH - Echo (1/20): EF 55-60%, mild LVH, normal RV size and systolic function.  2. COPD: Active smoker.  - PFTs (2/18) with minimal obstruction, mild restriction suggesting a parenchymal process, low DLCO.  - CT chest (6/18): No ILD, +emphysema, lung nodules.  - CT chest (11/19): Emphysema, no ILD, stable nodules 3. Type II diabetes with peripheral neuropathy.  4. OSA on CPAP 5. HTN 6. Hyperlipidemia 7. PAD: ABIs abnormal at outside office.  - Peripheral arterial dopplers (3/18) with > 50% left CIA and bilateral EIA stenosis, 50-74% mid LSFA stenosis.  - ABIs (2/19): 0.85 right, 0.86 left (mild).   SH: Lives in [place], works at [employer], smokes about 4-5 cigs/day now.  Rare ETOH.  Remote substance abuse.   FH: Aunt with CHF, brother with ESRD.    Review of systems complete and found to be negative unless listed in HPI.   Current Outpatient Medications  Medication Sig Dispense Refill  . aspirin EC 81 MG tablet Take 1 tablet (81 mg total) by mouth daily. 30 tablet 0  . atorvastatin (LIPITOR) 20 MG tablet Take 1 tablet (20 mg total) by mouth daily. 30 tablet 3  . carvedilol (COREG) 6.25 MG tablet Take 1 tablet (6.25 mg total) by mouth 2 (two) times daily  with a meal. 180 tablet 3  . CORLANOR 5 MG TABS tablet TAKE 1 TABLET BY MOUTH TWICE DAILY WITH MEALS 60 tablet 0  . ENTRESTO 97-103 MG Take 1 tablet by mouth twice daily 60 tablet 0  . furosemide (LASIX) 40 MG tablet Take 80 mg by mouth daily.    . isosorbide-hydrALAZINE (BIDIL) 20-37.5 MG tablet Take 1 tablet by mouth 2 (two) times daily.    . mesalamine (LIALDA) 1.2 g EC tablet Take 1.2 g 2 (two) times daily by mouth.    . potassium chloride SA (KLOR-CON) 20 MEQ tablet Take 20 mEq by mouth daily.    . sitaGLIPtin-metformin (JANUMET) 50-1000 MG tablet Take 1 tablet by mouth 2 (two) times daily  with a meal.    . spironolactone (ALDACTONE) 25 MG tablet Take 1 tablet (25 mg total) by mouth daily. 90 tablet 3   No current facility-administered medications for this encounter.   Today's Vitals   08/24/19 1352  BP: (!) 144/96  Pulse: 85  SpO2: 99%  Weight: 134.3 kg (296 lb)   Body mass index is 40.14 kg/m. Wt Readings from Last 3 Encounters:  08/24/19 134.3 kg (296 lb)  07/26/19 134.6 kg (296 lb 12.8 oz)  03/23/19 (!) 136.6 kg (301 lb 3.2 oz)    Exam:   General:  Well appearing. No resp difficulty. Walked in the clinic HEENT: normal Neck: supple. JVP 7-8 . Carotids 2+ bilat; no bruits. No lymphadenopathy or thryomegaly appreciated. Cor: PMI nondisplaced. Regular rate & rhythm. No rubs, gallops or murmurs. Lungs: clear Abdomen: soft, nontender, nondistended. No hepatosplenomegaly. No bruits or masses. Good bowel sounds. Extremities: no cyanosis, clubbing, rash, edema Neuro: alert & orientedx3, cranial nerves grossly intact. moves all 4 extremities w/o difficulty. Affect pleasant   Assessment/Plan: 1. Chronic systolic CHF: Nonischemic cardiomyopathy => ?due to viral myocarditis or prior substance abuse.  Medtronic ICD.  Echo 1/17 with severely dilated LV and EF 20%, EF up to 40% on echo 2/18.  Echo in 1/20 showed EF up to 55% with normal RV.   NYHA III. Volume status elevated on optivol. Impedance down.  - Continue lasix 80 mg in am and add 40 mg in the evening. Check BMET today and in 7 days.  - Continue spironolactone 25 mg daily -  Entresto 97/103 bid, Bidil 1 tab tid, Coreg 6.25 mg bid.  - Check BMET today.  2. Smoking/suspect COPD:  PFTs done, somewhat concerning for interstitial lung disease rather than emphysema with restrictive PFTs and decreased DLCO => no ILD on CT, emphysema noted along with lung nodules. Most recent repeat CT in 11/19 showed stable nodules, do not need repeat.  - discussed smoking cessation.  3. OSA: Continue CPAP every night.  4. PAD:  Significant disease on peripheral dopplers, stable mild claudication. He saw Dr. Gwenlyn Found, medical management recommended.  I encouraged him to try to walk at least a short distance through the pain.  - Continue ASA 81 and statin. Needs repeat lipids.  - Repeat ABIs due and will need followup with Dr. Gwenlyn Found.   Follow up in March with Dr Aundra Dubin. Discussed low salt food choices and limiting fluid intake to < 2 liters per day.    Jeanmarie Hubert, NP  08/24/2019  [place] [address] Heart and [place] [address] [phone] (office) [phone] (fax)

## 2019-08-24 NOTE — Patient Instructions (Addendum)
INCREASE Lasix to 80 mg (2 tabs) in the AM and 40 mg (1 tab) in the PM  Labs today We will only contact you if something comes back abnormal or we need to make some changes. Otherwise no news is good news!   Keep your follow up with Dr Aundra Dubin in March 2021  Do the following things EVERYDAY: 1) Weigh yourself in the morning before breakfast. Write it down and keep it in a log. 2) Take your medicines as prescribed 3) Eat low salt foods--Limit salt (sodium) to 2000 mg per day.  4) Stay as active as you can everyday 5) Limit all fluids for the day to less than 2 liters  At the [facility], you and your health needs are our priority. As part of our continuing mission to provide you with exceptional heart care, we have created designated Provider Care Teams. These Care Teams include your primary Cardiologist (physician) and Advanced Practice Providers (APPs- Physician Assistants and Nurse Practitioners) who all work together to provide you with the care you need, when you need it.   You may see any of the following providers on your designated Care Team at your next follow up: Marland Kitchen Dr Glori Bickers . Dr Loralie Champagne . Darrick Grinder, NP . Lyda Jester, PA . Audry Riles, PharmD   Please be sure to bring in all your medications bottles to every appointment.

## 2019-08-30 ENCOUNTER — Telehealth: Payer: Self-pay

## 2019-08-30 ENCOUNTER — Ambulatory Visit (INDEPENDENT_AMBULATORY_CARE_PROVIDER_SITE_OTHER): Payer: Medicare Other

## 2019-08-30 DIAGNOSIS — I5042 Chronic combined systolic (congestive) and diastolic (congestive) heart failure: Secondary | ICD-10-CM | POA: Diagnosis not present

## 2019-08-30 DIAGNOSIS — Z95810 Presence of automatic (implantable) cardiac defibrillator: Secondary | ICD-10-CM

## 2019-08-30 NOTE — Progress Notes (Signed)
EPIC Encounter for ICM Monitoring  Patient Name: David Bright is a 59 y.o. male Date: 08/30/2019 Primary Care Physican: Stacie Glaze, DO Primary Cardiologist:Nelson/McLean  Electrophysiologist:Klein 08/30/2019 Weight: 290-295 lbs   Spoke with patient and he has weight gain of approximately 5 lbs since December.  He tries to follow low salt diet  Optivolthoracic impedancesuggesting possible fluid accumulation since 08/12/2019.   Prescribed: Furosemide40 mgTake 2 tablets (80 mg total) every morning and 1 tablet (40 mg total) every evening (increased on 12/23).Potassium 20 mEq 1 a day twice a day.  Labs: 08/24/2019 Creatinine 1.21, BUN 24, Potassium 4.6, Sodium 139, GFR >60 08/16/2019 Creatinine 1.95, BUN 48, Potassium 4.8, Sodium 135, GFR 37-42 07/26/2019 Creatinine 1.27, BUN 25, Potassium 4.4, Sodium 139, GFR >60, BNP45.1 07/15/2019 Creatinine 1.86, BUN 39, Potassium 5.1, Sodium 130, GFR 39-45 03/23/2019 Creatinine 1.66, BUN 52, Potassium 4.5, Sodium 136, GFR 44-52 10/15/2018 Creatinine 1.26, BUN 18, Potassium 4.2, Sodium 139, GFR >60 08/31/2018 Creatinine 1.56, BUN 29, Potassium 4.3, Sodium 138, GFR 48-56 08/21/2018 Creatinine 2.10, BUN 45, Potassium 4.9, Sodium 136, GFR 34-39 A complete set of results can be found in Results Review  Recommendations: Advised will call back if any recommendations are given.  Encouraged to limit salt intake.  Follow-up plan: ICM clinic phone appointment on1/28/2021 to recheck fluid levels. 91 day device clinic remote transmission 09/09/2019.  Office appt 10/19/2019 with Dr. Aundra Dubin.    Copy of ICM check sent to Dr. Caryl Comes and Dr Aundra Dubin for review and recommendation.   3 month ICM trend: 08/30/2019    1 Year ICM trend:       [name], RN 08/30/2019 2:19 PM

## 2019-08-30 NOTE — Telephone Encounter (Signed)
Remote ICM transmission received.  Attempted call to patient regarding ICM remote transmission and he was currently at MD's office.  Will attempted call back.

## 2019-08-30 NOTE — Progress Notes (Signed)
Increase Lasix to 80 mg bid with BMET 1 week.  

## 2019-08-31 MED ORDER — FUROSEMIDE 40 MG PO TABS: ORAL_TABLET | ORAL | 3 refills | Status: DC

## 2019-08-31 NOTE — Progress Notes (Signed)
Spoke with patient and advised Dr Aundra Dubin recommended to take Furosemide 80 mg twice a day and BMET in a week.  He repeated instructions back correctly and agreed to lab appointment for BMET on 09/07/2019 at 11:30 AM.  He does not require a furosemide refill at this time and has supply on hand.   Discussed at length patient's diet.  He has been eating [employer] take out foods and reviewed nutrition of his food choices helping him to understand these foods are very high in salt.  Encouraged him to cook his meals at home using vegetables and lean meats.

## 2019-09-07 ENCOUNTER — Ambulatory Visit (HOSPITAL_COMMUNITY)
Admission: RE | Admit: 2019-09-07 | Discharge: 2019-09-07 | Disposition: A | Discharge status: Home / Self Care | Payer: Medicare Other | Source: Ambulatory Visit | Attending: Cardiology | Admitting: Cardiology

## 2019-09-07 ENCOUNTER — Other Ambulatory Visit: Payer: Self-pay

## 2019-09-07 ENCOUNTER — Telehealth (HOSPITAL_COMMUNITY): Payer: Self-pay

## 2019-09-07 DIAGNOSIS — I5042 Chronic combined systolic (congestive) and diastolic (congestive) heart failure: Secondary | ICD-10-CM | POA: Diagnosis not present

## 2019-09-07 LAB — BASIC METABOLIC PANEL
Anion gap: 10 (ref 5–15)
BUN: 65 mg/dL — ABNORMAL HIGH (ref 6–20)
CO2: 25 mmol/L (ref 22–32)
Calcium: 9 mg/dL (ref 8.9–10.3)
Chloride: 99 mmol/L (ref 98–111)
Creatinine, Ser: 2.01 mg/dL — ABNORMAL HIGH (ref 0.61–1.24)
GFR calc Af Amer: 41 mL/min — ABNORMAL LOW (ref >60)
GFR calc non Af Amer: 35 mL/min — ABNORMAL LOW (ref >60)
Glucose, Bld: 170 mg/dL — ABNORMAL HIGH (ref 70–99)
Potassium: 4.6 mmol/L (ref 3.5–5.1)
Sodium: 134 mmol/L — ABNORMAL LOW (ref 135–145)

## 2019-09-07 MED ORDER — FUROSEMIDE 40 MG PO TABS: 40.0000 mg | ORAL_TABLET | Freq: Two times a day (BID) | ORAL | 0 refills | Status: DC

## 2019-09-07 NOTE — Telephone Encounter (Signed)
-----   Message from Larey Dresser, MD sent at 09/07/2019  4:37 PM EST ----- Hold Lasix x 3 days then decrease to 40 mg bid.  BMET 1 week.

## 2019-09-09 ENCOUNTER — Ambulatory Visit (INDEPENDENT_AMBULATORY_CARE_PROVIDER_SITE_OTHER): Payer: Medicare Other

## 2019-09-09 ENCOUNTER — Ambulatory Visit (INDEPENDENT_AMBULATORY_CARE_PROVIDER_SITE_OTHER): Payer: Medicare Other | Admitting: *Deleted

## 2019-09-09 DIAGNOSIS — I5042 Chronic combined systolic (congestive) and diastolic (congestive) heart failure: Secondary | ICD-10-CM

## 2019-09-09 DIAGNOSIS — Z95810 Presence of automatic (implantable) cardiac defibrillator: Secondary | ICD-10-CM

## 2019-09-09 LAB — CUP PACEART REMOTE DEVICE CHECK
Battery Remaining Longevity: 99 mo
Battery Voltage: 3.01 V
Brady Statistic RV Percent Paced: 0.01 %
Date Time Interrogation Session: 20210128001802
HighPow Impedance: 74 Ohm
Implantable Lead Implant Date: 20160725
Implantable Lead Location: 753860
Implantable Lead Model: 293
Implantable Lead Serial Number: 383593
Implantable Pulse Generator Implant Date: 20160725
Lead Channel Impedance Value: 380 Ohm
Lead Channel Impedance Value: 399 Ohm
Lead Channel Pacing Threshold Amplitude: 0.625 V
Lead Channel Pacing Threshold Pulse Width: 0.4 ms
Lead Channel Sensing Intrinsic Amplitude: 10.875 mV
Lead Channel Sensing Intrinsic Amplitude: 10.875 mV
Lead Channel Setting Pacing Amplitude: 2.5 V
Lead Channel Setting Pacing Pulse Width: 0.4 ms
Lead Channel Setting Sensing Sensitivity: 0.3 mV

## 2019-09-09 NOTE — Progress Notes (Signed)
ICD Remote  

## 2019-09-13 ENCOUNTER — Other Ambulatory Visit (HOSPITAL_COMMUNITY): Payer: Self-pay | Admitting: Cardiology

## 2019-09-13 NOTE — Progress Notes (Signed)
EPIC Encounter for ICM Monitoring  Patient Name: David Bright is a 59 y.o. male Date: 09/13/2019 Primary Care Physican: Stacie Glaze, DO Primary Cardiologist:Nelson/McLean  Electrophysiologist:Klein 09/13/2019 Weight: 295 lbs   Spoke with patient and he is feeling fine.  He denies any fluid symptoms.  He reports Furosemide dosage was decreased after getting 1/26 lab results   Optivolthoracic impedancereturned to normal.    Prescribed: Furosemide40 mgTake 1 tablets (40 mg total) twice a day.Potassium 20 mEq 1 tablet by mouth a day.  Labs: 09/07/2019 Creatinine 2.01, BUN 65, Potassium 4.6, Sodium 134, GFR 35-41  08/24/2019 Creatinine 1.21, BUN 24, Potassium 4.6, Sodium 139, GFR >60 08/16/2019 Creatinine 1.95, BUN 48, Potassium 4.8, Sodium 135, GFR 37-42 07/26/2019 Creatinine 1.27, BUN 25, Potassium 4.4, Sodium 139, GFR >60, BNP45.1 07/15/2019 Creatinine 1.86, BUN 39, Potassium 5.1, Sodium 130, GFR 39-45 03/23/2019 Creatinine 1.66, BUN 52, Potassium 4.5, Sodium 136, GFR 44-52 10/15/2018 Creatinine 1.26, BUN 18, Potassium 4.2, Sodium 139, GFR >60 08/31/2018 Creatinine 1.56, BUN 29, Potassium 4.3, Sodium 138, GFR 48-56 08/21/2018 Creatinine 2.10, BUN 45, Potassium 4.9, Sodium 136, GFR 34-39 A complete set of results can be found in Results Review  Recommendations: No changes and encouraged to call if experiencing any fluid symptoms.  Follow-up plan: ICM clinic phone appointment on2/22/2021. 91 day device clinic remote transmission 12/09/2019.  Office appt 10/19/2019 with Dr. Aundra Dubin.    3 month ICM trend: 09/09/2019    1 Year ICM trend:       [name], RN 09/13/2019 2:02 PM

## 2019-09-14 ENCOUNTER — Other Ambulatory Visit: Payer: Self-pay

## 2019-09-14 ENCOUNTER — Ambulatory Visit (HOSPITAL_COMMUNITY)
Admission: RE | Admit: 2019-09-14 | Discharge: 2019-09-14 | Disposition: A | Discharge status: Home / Self Care | Payer: Medicare Other | Source: Ambulatory Visit | Attending: Cardiology | Admitting: Cardiology

## 2019-09-14 DIAGNOSIS — I5042 Chronic combined systolic (congestive) and diastolic (congestive) heart failure: Secondary | ICD-10-CM | POA: Diagnosis present

## 2019-09-14 LAB — BASIC METABOLIC PANEL
Anion gap: 12 (ref 5–15)
BUN: 58 mg/dL — ABNORMAL HIGH (ref 6–20)
CO2: 24 mmol/L (ref 22–32)
Calcium: 9.2 mg/dL (ref 8.9–10.3)
Chloride: 101 mmol/L (ref 98–111)
Creatinine, Ser: 1.72 mg/dL — ABNORMAL HIGH (ref 0.61–1.24)
GFR calc Af Amer: 49 mL/min — ABNORMAL LOW (ref >60)
GFR calc non Af Amer: 43 mL/min — ABNORMAL LOW (ref >60)
Glucose, Bld: 237 mg/dL — ABNORMAL HIGH (ref 70–99)
Potassium: 5.4 mmol/L — ABNORMAL HIGH (ref 3.5–5.1)
Sodium: 137 mmol/L (ref 135–145)

## 2019-09-16 ENCOUNTER — Telehealth (HOSPITAL_COMMUNITY): Payer: Self-pay

## 2019-09-16 DIAGNOSIS — I5042 Chronic combined systolic (congestive) and diastolic (congestive) heart failure: Secondary | ICD-10-CM

## 2019-09-16 NOTE — Telephone Encounter (Signed)
-----   Message from Larey Dresser, MD sent at 09/15/2019 12:35 AM EST ----- Stop KCl supplement, low K diet.  Creatinine better.  Repeat BMET in 1 week.

## 2019-09-23 ENCOUNTER — Other Ambulatory Visit: Payer: Self-pay

## 2019-09-23 ENCOUNTER — Ambulatory Visit (HOSPITAL_COMMUNITY)
Admission: RE | Admit: 2019-09-23 | Discharge: 2019-09-23 | Disposition: A | Discharge status: Home / Self Care | Payer: Medicare Other | Source: Ambulatory Visit | Attending: Cardiology | Admitting: Cardiology

## 2019-09-23 DIAGNOSIS — I5042 Chronic combined systolic (congestive) and diastolic (congestive) heart failure: Secondary | ICD-10-CM | POA: Insufficient documentation

## 2019-09-23 LAB — BASIC METABOLIC PANEL
Anion gap: 9 (ref 5–15)
BUN: 50 mg/dL — ABNORMAL HIGH (ref 6–20)
CO2: 26 mmol/L (ref 22–32)
Calcium: 9 mg/dL (ref 8.9–10.3)
Chloride: 103 mmol/L (ref 98–111)
Creatinine, Ser: 1.63 mg/dL — ABNORMAL HIGH (ref 0.61–1.24)
GFR calc Af Amer: 53 mL/min — ABNORMAL LOW (ref >60)
GFR calc non Af Amer: 45 mL/min — ABNORMAL LOW (ref >60)
Glucose, Bld: 214 mg/dL — ABNORMAL HIGH (ref 70–99)
Potassium: 4.5 mmol/L (ref 3.5–5.1)
Sodium: 138 mmol/L (ref 135–145)

## 2019-10-04 ENCOUNTER — Other Ambulatory Visit (HOSPITAL_COMMUNITY): Payer: Self-pay | Admitting: Cardiology

## 2019-10-05 ENCOUNTER — Telehealth: Payer: Self-pay

## 2019-10-05 NOTE — Telephone Encounter (Signed)
Left message for patient to remind of missed remote transmission.  

## 2019-10-11 ENCOUNTER — Ambulatory Visit (INDEPENDENT_AMBULATORY_CARE_PROVIDER_SITE_OTHER): Payer: Medicare Other

## 2019-10-11 DIAGNOSIS — Z95810 Presence of automatic (implantable) cardiac defibrillator: Secondary | ICD-10-CM | POA: Diagnosis not present

## 2019-10-11 DIAGNOSIS — I5042 Chronic combined systolic (congestive) and diastolic (congestive) heart failure: Secondary | ICD-10-CM | POA: Diagnosis not present

## 2019-10-12 NOTE — Progress Notes (Signed)
EPIC Encounter for ICM Monitoring  Patient Name: David Bright is a 59 y.o. male Date: 10/12/2019 Primary Care Physican: Stacie Glaze, DO Primary Cardiologist:Nelson/McLean  Electrophysiologist:Klein 3/2/2021Weight: [id number]   Spoke with patient and he is feeling fine.  He denies any fluid symptoms.    Optivolthoracic impedancenormal.   Prescribed: Furosemide40 mgTake 1 tablets (40 mg total) twice a day.  Labs: 09/07/2019 Creatinine 2.01, BUN 65, Potassium 4.6, Sodium 134, GFR 35-41  08/24/2019 Creatinine 1.21, BUN 24, Potassium 4.6, Sodium 139, GFR >60 08/16/2019 Creatinine 1.95, BUN 48, Potassium 4.8, Sodium 135, GFR 37-42 A complete set of results can be found in Results Review  Recommendations: No changes and encouraged to call if experiencing any fluid symptoms.  Follow-up plan: ICM clinic phone appointment on4/12/2019. 91 day device clinic remote transmission 12/09/2019.Office appt 10/19/2019 with Dr.McLean.   3 month ICM trend: 10/11/2019    1 Year ICM trend:       [name], RN 10/12/2019 4:26 PM

## 2019-10-18 ENCOUNTER — Telehealth (HOSPITAL_COMMUNITY): Payer: Self-pay

## 2019-10-18 NOTE — Telephone Encounter (Signed)

## 2019-10-19 ENCOUNTER — Ambulatory Visit (HOSPITAL_COMMUNITY)
Admission: RE | Admit: 2019-10-19 | Discharge: 2019-10-19 | Disposition: A | Discharge status: Home / Self Care | Payer: Medicare Other | Source: Ambulatory Visit | Attending: Cardiology | Admitting: Cardiology

## 2019-10-19 ENCOUNTER — Other Ambulatory Visit: Payer: Self-pay

## 2019-10-19 ENCOUNTER — Ambulatory Visit (HOSPITAL_BASED_OUTPATIENT_CLINIC_OR_DEPARTMENT_OTHER)
Admission: RE | Admit: 2019-10-19 | Discharge: 2019-10-19 | Disposition: A | Discharge status: Home / Self Care | Payer: Medicare Other | Source: Ambulatory Visit | Attending: Cardiology | Admitting: Cardiology

## 2019-10-19 ENCOUNTER — Encounter (HOSPITAL_COMMUNITY): Payer: Self-pay | Admitting: Cardiology

## 2019-10-19 VITALS — BP 92/54 | HR 83 | Wt 302.6 lb

## 2019-10-19 DIAGNOSIS — I5042 Chronic combined systolic (congestive) and diastolic (congestive) heart failure: Secondary | ICD-10-CM | POA: Diagnosis not present

## 2019-10-19 DIAGNOSIS — Z7982 Long term (current) use of aspirin: Secondary | ICD-10-CM | POA: Diagnosis not present

## 2019-10-19 DIAGNOSIS — F17210 Nicotine dependence, cigarettes, uncomplicated: Secondary | ICD-10-CM | POA: Insufficient documentation

## 2019-10-19 DIAGNOSIS — I428 Other cardiomyopathies: Secondary | ICD-10-CM | POA: Diagnosis not present

## 2019-10-19 DIAGNOSIS — E1142 Type 2 diabetes mellitus with diabetic polyneuropathy: Secondary | ICD-10-CM | POA: Diagnosis not present

## 2019-10-19 DIAGNOSIS — Z794 Long term (current) use of insulin: Secondary | ICD-10-CM | POA: Insufficient documentation

## 2019-10-19 DIAGNOSIS — Z950 Presence of cardiac pacemaker: Secondary | ICD-10-CM | POA: Insufficient documentation

## 2019-10-19 DIAGNOSIS — E1122 Type 2 diabetes mellitus with diabetic chronic kidney disease: Secondary | ICD-10-CM | POA: Diagnosis not present

## 2019-10-19 DIAGNOSIS — Z79899 Other long term (current) drug therapy: Secondary | ICD-10-CM | POA: Insufficient documentation

## 2019-10-19 DIAGNOSIS — G4733 Obstructive sleep apnea (adult) (pediatric): Secondary | ICD-10-CM | POA: Diagnosis not present

## 2019-10-19 DIAGNOSIS — E1151 Type 2 diabetes mellitus with diabetic peripheral angiopathy without gangrene: Secondary | ICD-10-CM | POA: Insufficient documentation

## 2019-10-19 DIAGNOSIS — J449 Chronic obstructive pulmonary disease, unspecified: Secondary | ICD-10-CM | POA: Diagnosis not present

## 2019-10-19 DIAGNOSIS — I5022 Chronic systolic (congestive) heart failure: Secondary | ICD-10-CM | POA: Insufficient documentation

## 2019-10-19 DIAGNOSIS — I130 Hypertensive heart and chronic kidney disease with heart failure and stage 1 through stage 4 chronic kidney disease, or unspecified chronic kidney disease: Secondary | ICD-10-CM | POA: Diagnosis not present

## 2019-10-19 DIAGNOSIS — E785 Hyperlipidemia, unspecified: Secondary | ICD-10-CM | POA: Diagnosis not present

## 2019-10-19 DIAGNOSIS — N183 Chronic kidney disease, stage 3 unspecified: Secondary | ICD-10-CM | POA: Insufficient documentation

## 2019-10-19 LAB — BASIC METABOLIC PANEL WITH GFR
Anion gap: 11 (ref 5–15)
BUN: 38 mg/dL — ABNORMAL HIGH (ref 6–20)
CO2: 22 mmol/L (ref 22–32)
Calcium: 8.8 mg/dL — ABNORMAL LOW (ref 8.9–10.3)
Chloride: 102 mmol/L (ref 98–111)
Creatinine, Ser: 1.64 mg/dL — ABNORMAL HIGH (ref 0.61–1.24)
GFR calc Af Amer: 52 mL/min — ABNORMAL LOW
GFR calc non Af Amer: 45 mL/min — ABNORMAL LOW
Glucose, Bld: 169 mg/dL — ABNORMAL HIGH (ref 70–99)
Potassium: 4 mmol/L (ref 3.5–5.1)
Sodium: 135 mmol/L (ref 135–145)

## 2019-10-19 LAB — LIPID PANEL
Cholesterol: 125 mg/dL (ref 0–200)
HDL: 38 mg/dL — ABNORMAL LOW
LDL Cholesterol: 64 mg/dL (ref 0–99)
Total CHOL/HDL Ratio: 3.3 ratio
Triglycerides: 117 mg/dL
VLDL: 23 mg/dL (ref 0–40)

## 2019-10-19 NOTE — Progress Notes (Signed)
ID:  David Bright, DOB Nov 01, 1960, MRN [id number]   Provider location: [facility] Type of Visit: Established patient   PCP:  Stacie Glaze, DO  HF Cardiology: Dr. Aundra Dubin   History of Present Illness: David Bright is a 59 y.o. male who has a history of chronic systolic CHF and COPD/active smoking.  He has a known nonischemic cardiomyopathy, echo in 1/17 with EF 20% and a severely dilated LV.  No coronary disease on 7/16 cardiac cath.  Repeat echo 2/18, showing EF up to 40%.   He had significant PAD by peripheral arterial dopplers, saw Dr. Gwenlyn Found with plan of medical management for now. He also has knee pain which is limiting.   Echo 09/2017: EF up to 50-55% with mild LVH. Echo in 1/20 showed EF 55-60%.  Echo was done today and reviewed, EF 55-60% with normal RV.   He returns for followup of CHF.  Weight is up 6 lbs.  He is still smoking.  He was able to cut back in the past with Chantix but is out of this now.  No significant exertional dyspnea.  No chest pain.  No lightheadedness.    Medtronic device interrogation: No VT, no AF, stable thoracic impedance.   Labs (12/17): K 4.3, creatinine 1.09 Labs (1/18): K 3.9, creatinine 1.13 Labs (3/18): K 4.3, creatinine 1.48, NT-proBNP 61 Labs (4/18): K 4.5, creatinine 1.23, LDL 71, BNP 34 Labs (5/18): K 4.5, creatinine 1.2 Labs (1/19): K 4.2, creatinine 1.19, LDL 69, HDL 39 Labs (11/19): LDL 66, HDL 34, K 3.3, creatinine 1.19, BNP 45 Labs (12/19): K 4.4, creatinine 1.59 Labs (8/20): K 4.5, creatinine 1.66 Labs (2/21): K 4.5, creatinine 1.63  PMH:  1. Chronic systolic CHF: Nonischemic cardiomyopathy.  Has Medtronic ICD.   - LHC (7/16) with nonobstructive disease.  - Echo (1/17) with EF 20%, severe LV dilation, mild to moderate MR.  - Echo (2/18): EF 40%, mild LV dilation.  - Echo (5/19): EF 50-55%, mild LVH - Echo (1/20): EF 55-60%, mild LVH, normal RV size and systolic function.  - Echo (3/12): EF 50-55%, RV  normal, IVC normal 2. COPD: Active smoker.  - PFTs (2/18) with minimal obstruction, mild restriction suggesting a parenchymal process, low DLCO.  - CT chest (6/18): No ILD, +emphysema, lung nodules.  - CT chest (11/19): Emphysema, no ILD, stable nodules 3. Type II diabetes with peripheral neuropathy.  4. OSA on CPAP 5. HTN 6. Hyperlipidemia 7. PAD: ABIs abnormal at outside office.  - Peripheral arterial dopplers (3/18) with > 50% left CIA and bilateral EIA stenosis, 50-74% mid LSFA stenosis.  - ABIs (2/19): 0.85 right, 0.86 left (mild).  - ABIs (5/20): 0.89 right, 0.86 left (mild).  8. CKD: Stage 3.  9. B12 deficiency.   SH: Lives in [address], works at [employer], smokes about 4-5 cigs/day now.  Rare ETOH.  Remote substance abuse.   FH: Aunt with CHF, brother with ESRD.    Review of systems complete and found to be negative unless listed in HPI.   Current Outpatient Medications  Medication Sig Dispense Refill  . aspirin EC 81 MG tablet Take 1 tablet (81 mg total) by mouth daily. 30 tablet 0  . atorvastatin (LIPITOR) 20 MG tablet Take 1 tablet (20 mg total) by mouth daily. 30 tablet 3  . BIDIL 20-37.5 MG tablet TAKE 1 TABLET BY MOUTH THREE TIMES DAILY 90 tablet 0  . carvedilol (COREG) 6.25 MG tablet Take 1  tablet (6.25 mg total) by mouth 2 (two) times daily with a meal. 180 tablet 3  . ENTRESTO 97-103 MG Take 1 tablet by mouth twice daily 60 tablet 0  . furosemide (LASIX) 80 MG tablet Take 80 mg by mouth daily.    . insulin lispro (HUMALOG) 100 UNIT/ML injection Inject into the skin.    Marland Kitchen LANTUS SOLOSTAR 100 UNIT/ML Solostar Pen Inject 20 Units into the skin at bedtime.    . mesalamine (LIALDA) 1.2 g EC tablet Take 1.2 g 2 (two) times daily by mouth.    . sitaGLIPtin-metformin (JANUMET) 50-1000 MG tablet Take 1 tablet by mouth 2 (two) times daily with a meal.    . spironolactone (ALDACTONE) 25 MG tablet Take 1 tablet (25 mg total) by mouth daily. 90 tablet 3  . Vitamin D,  Ergocalciferol, (DRISDOL) 1.25 MG (50000 UNIT) CAPS capsule Take 50,000 Units by mouth once a week.     No current facility-administered medications for this encounter.   Exam:   BP (!) 92/54   Pulse 83   Wt (!) 137.3 kg (302 lb 9.6 oz)   SpO2 97%   BMI 41.04 kg/m  General: NAD Neck: No JVD, no thyromegaly or thyroid nodule.  Lungs: Clear to auscultation bilaterally with normal respiratory effort. CV: Nondisplaced PMI.  Heart regular S1/S2, no S3/S4, no murmur.  No peripheral edema.  No carotid bruit.  Normal pedal pulses.  Abdomen: Soft, nontender, no hepatosplenomegaly, no distention.  Skin: Intact without lesions or rashes.  Neurologic: Alert and oriented x 3.  Psych: Normal affect. Extremities: No clubbing or cyanosis.  HEENT: Normal.    Assessment/Plan: 1. Chronic systolic CHF: Nonischemic cardiomyopathy => ?due to viral myocarditis or prior substance abuse.  Medtronic ICD.  Echo 1/17 with severely dilated LV and EF 20%, EF up to 40% on echo 2/18.  Echo in 1/20 showed EF up to 55% with normal RV, and echo reviewed today showed EF 55-60%.  NYHA class I-II symptoms recently.  He is not volume overloaded on exam or by Optivol.  - Continue current Lasix dosing. I will arrange for a BMET.  - Continue spironolactone 25 mg daily, Entresto 97/103 bid, Bidil 1 tab tid, Coreg 6.25 mg bid.  - He can stop Corlanor with recovered EF.  - Low sodium diet.  2. Smoking/suspect COPD:  PFTs done, somewhat concerning for interstitial lung disease rather than emphysema with restrictive PFTs and decreased DLCO => no ILD on CT, emphysema noted along with lung nodules. Most recent repeat CT in 11/19 showed stable nodules, do not need repeat. He continues to smoke a few cigarettes/day.   - Chantix helped in the past, I will have him try it again.  3. OSA: Continue CPAP. No change.  4. PAD: Significant disease on peripheral dopplers, stable mild claudication. He saw Dr. Gwenlyn Found, medical management  recommended.  I encouraged him to try to walk at least a short distance through the pain. ABIs in 5/20 mildly reduced.  - Continue ASA 81 and statin. Check lipids today.  - He will need followup with Dr. Gwenlyn Found for [place].   BMET in 3 months, followup with me 6 months.    Signed, Loralie Champagne, MD  10/19/2019  [facility] [address] [facility] [place] [address] [phone] (office) [phone] (fax)

## 2019-10-19 NOTE — Patient Instructions (Addendum)
STOP Corlanor  Labs today and repeat in 3 months We will only contact you if something comes back abnormal or we need to make some changes. Otherwise no news is good news!  Your physician recommends that you schedule a follow-up appointment in: 6 months with Dr Aundra Dubin  Please call office at [phone] option 2 if you have any questions or concerns.   At the [facility], you and your health needs are our priority. As part of our continuing mission to provide you with exceptional heart care, we have created designated Provider Care Teams. These Care Teams include your primary Cardiologist (physician) and Advanced Practice Providers (APPs- Physician Assistants and Nurse Practitioners) who all work together to provide you with the care you need, when you need it.   You may see any of the following providers on your designated Care Team at your next follow up: Marland Kitchen Dr Glori Bickers . Dr Loralie Champagne . Darrick Grinder, NP . Lyda Jester, PA . Audry Riles, PharmD   Please be sure to bring in all your medications bottles to every appointment.

## 2019-10-19 NOTE — Progress Notes (Signed)
  Echocardiogram 2D Echocardiogram has been performed.  David Bright 10/19/2019, 10:49 AM

## 2019-10-28 ENCOUNTER — Encounter (HOSPITAL_COMMUNITY): Payer: Medicare Other

## 2019-11-06 ENCOUNTER — Other Ambulatory Visit (HOSPITAL_COMMUNITY): Payer: Self-pay | Admitting: Cardiology

## 2019-11-15 ENCOUNTER — Ambulatory Visit (INDEPENDENT_AMBULATORY_CARE_PROVIDER_SITE_OTHER): Payer: Medicare Other

## 2019-11-15 DIAGNOSIS — I5042 Chronic combined systolic (congestive) and diastolic (congestive) heart failure: Secondary | ICD-10-CM | POA: Diagnosis not present

## 2019-11-15 DIAGNOSIS — Z95810 Presence of automatic (implantable) cardiac defibrillator: Secondary | ICD-10-CM

## 2019-11-17 NOTE — Progress Notes (Signed)
EPIC Encounter for ICM Monitoring  Patient Name: David Bright is a 59 y.o. male Date: 11/17/2019 Primary Care Physican: Stacie Glaze, DO Primary Cardiologist:Nelson/McLean  Electrophysiologist:Klein 3/2/2021Weight: [id number]   Spoke with patient and reports feeling well at this time.  Denies fluid symptoms.    Optivolthoracic impedancenormal.  Prescribed: Furosemide80 mgTake1tablet (80 mg total)daily.  Labs: 10/19/2019 Creatinine 1.64, BUN 38, Potassium 4.0, Sodium 135, GFR 45-52 09/23/2019 Creatinine 1.63, BUN 50, Potassium 4.5, Sodium 138, GFR 45-53  09/14/2019 Creatinine 1.72, BUN 58, Potassium 5.4, Sodium 137, GFR 43-49  09/07/2019 Creatinine 2.01, BUN 65, Potassium 4.6, Sodium 134, GFR 35-41 08/24/2019 Creatinine 1.21, BUN 24, Potassium 4.6, Sodium 139, GFR >60 08/16/2019 Creatinine 1.95, BUN 48, Potassium 4.8, Sodium 135, GFR 37-42 A complete set of results can be found in Results Review  Recommendations: No changes and encouraged to call if experiencing any fluid symptoms.  Follow-up plan: ICM clinic phone appointment on5/06/2020. 91 day device clinic remote transmission4/29/2021.  3 month ICM trend: 11/15/2019    1 Year ICM trend:       [name], RN 11/17/2019 9:03 AM

## 2019-11-23 ENCOUNTER — Other Ambulatory Visit (HOSPITAL_COMMUNITY): Payer: Self-pay | Admitting: Cardiology

## 2019-12-09 ENCOUNTER — Ambulatory Visit (INDEPENDENT_AMBULATORY_CARE_PROVIDER_SITE_OTHER): Payer: Medicare Other | Admitting: *Deleted

## 2019-12-09 DIAGNOSIS — I5042 Chronic combined systolic (congestive) and diastolic (congestive) heart failure: Secondary | ICD-10-CM | POA: Diagnosis not present

## 2019-12-09 LAB — CUP PACEART REMOTE DEVICE CHECK
Battery Remaining Longevity: 95 mo
Battery Voltage: 3 V
Brady Statistic RV Percent Paced: 0.01 %
Date Time Interrogation Session: 20210429012403
HighPow Impedance: 74 Ohm
Implantable Lead Implant Date: 20160725
Implantable Lead Location: 753860
Implantable Lead Model: 293
Implantable Lead Serial Number: 383593
Implantable Pulse Generator Implant Date: 20160725
Lead Channel Impedance Value: 342 Ohm
Lead Channel Impedance Value: 380 Ohm
Lead Channel Pacing Threshold Amplitude: 0.75 V
Lead Channel Pacing Threshold Pulse Width: 0.4 ms
Lead Channel Sensing Intrinsic Amplitude: 11.125 mV
Lead Channel Sensing Intrinsic Amplitude: 11.125 mV
Lead Channel Setting Pacing Amplitude: 2.5 V
Lead Channel Setting Pacing Pulse Width: 0.4 ms
Lead Channel Setting Sensing Sensitivity: 0.3 mV

## 2019-12-10 NOTE — Progress Notes (Signed)
ICD Remote  

## 2019-12-15 ENCOUNTER — Ambulatory Visit (HOSPITAL_COMMUNITY)
Admission: RE | Admit: 2019-12-15 | Discharge: 2019-12-15 | Disposition: A | Discharge status: Home / Self Care | Payer: Medicare Other | Source: Ambulatory Visit | Attending: Cardiovascular Disease | Admitting: Cardiovascular Disease

## 2019-12-15 ENCOUNTER — Other Ambulatory Visit: Payer: Self-pay

## 2019-12-15 DIAGNOSIS — I739 Peripheral vascular disease, unspecified: Secondary | ICD-10-CM

## 2019-12-20 ENCOUNTER — Other Ambulatory Visit (HOSPITAL_COMMUNITY): Payer: Self-pay | Admitting: Student

## 2019-12-21 ENCOUNTER — Ambulatory Visit (INDEPENDENT_AMBULATORY_CARE_PROVIDER_SITE_OTHER): Payer: Medicare Other

## 2019-12-21 DIAGNOSIS — I5042 Chronic combined systolic (congestive) and diastolic (congestive) heart failure: Secondary | ICD-10-CM

## 2019-12-21 DIAGNOSIS — Z95810 Presence of automatic (implantable) cardiac defibrillator: Secondary | ICD-10-CM | POA: Diagnosis not present

## 2019-12-21 NOTE — Progress Notes (Signed)
EPIC Encounter for ICM Monitoring  Patient Name: David Bright is a 59 y.o. male Date: 12/21/2019 Primary Care Physican: Stacie Glaze, DO Primary Cardiologist:Nelson/McLean  Electrophysiologist:Klein 5/12/2021Weight: [id number]   Spoke with patient and reports feeling well at this time.  Denies fluid symptoms.    Optivolthoracic impedance trending close to baselinenormal.  Prescribed:               Spironolactone 25 mg take 1 tablet daily  Furosemide 80 mg take 1 tablet (80 mg total) daily.  Labs: 10/19/2019 Creatinine 1.64, BUN 38, Potassium 4.0, Sodium 135, GFR 45-52 09/23/2019 Creatinine 1.63, BUN 50, Potassium 4.5, Sodium 138, GFR 45-53  09/14/2019 Creatinine 1.72, BUN 58, Potassium 5.4, Sodium 137, GFR 43-49  09/07/2019 Creatinine 2.01, BUN 65, Potassium 4.6, Sodium 134, GFR 35-41 08/24/2019 Creatinine 1.21, BUN 24, Potassium 4.6, Sodium 139, GFR >60 08/16/2019 Creatinine 1.95, BUN 48, Potassium 4.8, Sodium 135, GFR 37-42 A complete set of results can be found in Results Review  Recommendations: No changes and encouraged to call if experiencing any fluid symptoms.  Follow-up plan: ICM clinic phone appointment on6/14/2021. 91 day device clinic remote transmission7/29/2021.  Copy of ICM check sent to Dr. Caryl Comes.   3 month ICM trend: 12/21/2019    1 Year ICM trend:       [name], RN 12/21/2019 9:48 AM

## 2020-01-19 ENCOUNTER — Ambulatory Visit (HOSPITAL_COMMUNITY)
Admission: RE | Admit: 2020-01-19 | Discharge: 2020-01-19 | Disposition: A | Discharge status: Home / Self Care | Payer: Medicare Other | Source: Ambulatory Visit | Attending: Cardiology | Admitting: Cardiology

## 2020-01-19 ENCOUNTER — Other Ambulatory Visit: Payer: Self-pay

## 2020-01-19 DIAGNOSIS — I5042 Chronic combined systolic (congestive) and diastolic (congestive) heart failure: Secondary | ICD-10-CM | POA: Insufficient documentation

## 2020-01-19 LAB — BASIC METABOLIC PANEL
Anion gap: 9 (ref 5–15)
BUN: 23 mg/dL — ABNORMAL HIGH (ref 6–20)
CO2: 23 mmol/L (ref 22–32)
Calcium: 8.7 mg/dL — ABNORMAL LOW (ref 8.9–10.3)
Chloride: 103 mmol/L (ref 98–111)
Creatinine, Ser: 1.24 mg/dL (ref 0.61–1.24)
GFR calc Af Amer: >60 mL/min (ref >60)
GFR calc non Af Amer: >60 mL/min (ref >60)
Glucose, Bld: 248 mg/dL — ABNORMAL HIGH (ref 70–99)
Potassium: 4.2 mmol/L (ref 3.5–5.1)
Sodium: 135 mmol/L (ref 135–145)

## 2020-01-24 ENCOUNTER — Ambulatory Visit (INDEPENDENT_AMBULATORY_CARE_PROVIDER_SITE_OTHER): Payer: Medicare Other

## 2020-01-24 DIAGNOSIS — I5042 Chronic combined systolic (congestive) and diastolic (congestive) heart failure: Secondary | ICD-10-CM | POA: Diagnosis not present

## 2020-01-24 DIAGNOSIS — Z95810 Presence of automatic (implantable) cardiac defibrillator: Secondary | ICD-10-CM | POA: Diagnosis not present

## 2020-01-26 NOTE — Progress Notes (Signed)
Patient returned call and read the label.  He said he has 40 mg tablets and taking 2 tablets every morning and 1-2 tablets every evening.  He is unsure how long he has been following these directions. Advised of current prescription to take Furosemide 80 mg daily as prescribed by Dr Aundra Dubin and he repeated instructions back correctly.

## 2020-01-26 NOTE — Progress Notes (Signed)
EPIC Encounter for ICM Monitoring  Patient Name: David Bright is a 59 y.o. male Date: 01/26/2020 Primary Care Physican: Stacie Glaze, DO Primary Cardiologist:Nelson/McLean  Electrophysiologist:Klein 5/12/2021Weight: [id number]   Spoke with patient and reports feeling well at this time.  Denies fluid symptoms.   Patient reports taking Furosemide more than prescribed.  He reports taking 2 tablets in the morning and one if the afternoon.          He is not at home and unsure what dosage are the tablets.   Optivolthoracic impedance suggesting normal fluid levels.  Prescribed:               Spironolactone 25 mg take 1 tablet daily  Furosemide 80 mg take 1 tablet (80 mg total) daily.  Labs: 01/19/2020 Creatinine 1.24, BUN 23, Potassium 4.2, Sodium 135, GFR >60 10/19/2019 Creatinine1.64, BUN38, Potassium4.0, Sodium135, [id number] 09/23/2019 Creatinine1.63, BUN50, Potassium4.5, Sodium138, [id number]  09/14/2019 Creatinine1.72, BUN58, Potassium5.4, [id number], [id number] 09/07/2019 Creatinine 2.01, BUN 65, Potassium 4.6, Sodium 134, GFR 35-41 08/24/2019 Creatinine 1.21, BUN 24, Potassium 4.6, Sodium 139, GFR >60 08/16/2019 Creatinine 1.95, BUN 48, Potassium 4.8, Sodium 135, GFR 37-42 A complete set of results can be found in Results Review  Recommendations: Advised patient to call back and read the prescription bottle to me to make sure he is taking correct dosage.  Will check with HF clinic if pt is eligible for EMT program to assist with meds.  Follow-up plan: ICM clinic phone appointment on7/19/2021. 91 day device clinic remote transmission7/29/2021.  Copy of ICM check sent to Dr. Caryl Comes.   3 month ICM trend: 01/24/2020    1 Year ICM trend:       [name], RN 01/26/2020 10:27 AM

## 2020-02-28 ENCOUNTER — Ambulatory Visit (INDEPENDENT_AMBULATORY_CARE_PROVIDER_SITE_OTHER): Payer: Medicare Other

## 2020-02-28 ENCOUNTER — Telehealth (HOSPITAL_COMMUNITY): Payer: Self-pay | Admitting: *Deleted

## 2020-02-28 ENCOUNTER — Telehealth: Payer: Self-pay | Admitting: Internal Medicine

## 2020-02-28 DIAGNOSIS — I5042 Chronic combined systolic (congestive) and diastolic (congestive) heart failure: Secondary | ICD-10-CM

## 2020-02-28 DIAGNOSIS — Z95810 Presence of automatic (implantable) cardiac defibrillator: Secondary | ICD-10-CM

## 2020-02-28 NOTE — Telephone Encounter (Signed)
Patient called with questions on his furosemide. I informed him the medication is prescribed by Dr. Aundra Dubin and to contact his office. Patient verbalized understanding and agreed.

## 2020-02-28 NOTE — Telephone Encounter (Addendum)
Pt left VM stating he had questions about a new medication he started he wasn't sure if he should be taking the new med and torsemide. I called the patient to get more information. No answer/left vm requesting return call.

## 2020-03-01 NOTE — Progress Notes (Signed)
EPIC Encounter for ICM Monitoring  Patient Name: David Bright is a 59 y.o. male Date: 03/01/2020 Primary Care Physican: Stacie Glaze, DO Primary Cardiologist:Nelson/McLean  Electrophysiologist:Klein 5/12/2021Weight: [id number]   Spoke with patient and reports feeling well at this time.  Denies fluid symptoms.   Patient reports his diabetic physician prescribed Farxiga.  His fluid intake is >64 oz and does not follow low salt diet.   Optivolthoracic impedance suggesting close to normal.  Prescribed:   Spironolactone 25 mg take 1 tablet daily  Furosemide 80 mg take 1 tablet (80 mg total) daily.  Labs: 01/19/2020 Creatinine 1.24, BUN 23, Potassium 4.2, Sodium 135, GFR >60 10/19/2019 Creatinine1.64, BUN38, Potassium4.0, Sodium135, [id number] 09/23/2019 Creatinine1.63, BUN50, Potassium4.5, Sodium138, [id number]  09/14/2019 Creatinine1.72, BUN58, Potassium5.4, [id number], [id number] 09/07/2019 Creatinine 2.01, BUN 65, Potassium 4.6, Sodium 134, GFR 35-41 08/24/2019 Creatinine 1.21, BUN 24, Potassium 4.6, Sodium 139, GFR >60 08/16/2019 Creatinine 1.95, BUN 48, Potassium 4.8, Sodium 135, GFR 37-42 A complete set of results can be found in Results Review  Recommendations: Reinforced limiting salt intake to < 2000 mg daily and fluid intake to 64 oz daily.  Encouraged to call if experiencing fluid symptoms.  Follow-up plan: ICM clinic phone appointment on 04/03/2020.   91 day device clinic remote transmission 03/09/2020.    EP/Cardiology Office Visits: Recall for 04/16/2020 with Dr. Aundra Dubin.  Last EP visit was with Dr Caryl Comes on 06/19/2017.  Message sent to EP scheduler to call patient to schedule EP visit.  Copy of ICM check sent to Dr. Caryl Comes.   3 month ICM trend: 02/28/2020    1 Year ICM trend:       [name], RN 03/01/2020 3:44 PM

## 2020-03-03 ENCOUNTER — Telehealth: Payer: Self-pay | Admitting: Internal Medicine

## 2020-03-03 NOTE — Telephone Encounter (Signed)
New message   Message received from Sharman Cheek to schedule this patient with Dr. Caryl Comes next available or APP for past due f/u.   In recall, it says that pt would like to switch from Dr. Caryl Comes to Dr. Curt Bears d/t location. I do not see a previous request for this. Is this switch approved?

## 2020-03-06 NOTE — Telephone Encounter (Signed)
Fine from my perspective

## 2020-03-09 ENCOUNTER — Ambulatory Visit (INDEPENDENT_AMBULATORY_CARE_PROVIDER_SITE_OTHER): Payer: Medicare Other | Admitting: *Deleted

## 2020-03-09 DIAGNOSIS — I428 Other cardiomyopathies: Secondary | ICD-10-CM

## 2020-03-09 LAB — CUP PACEART REMOTE DEVICE CHECK
Battery Remaining Longevity: 91 mo
Battery Voltage: 3 V
Brady Statistic RV Percent Paced: 0 %
Date Time Interrogation Session: 20210729043723
HighPow Impedance: 70 Ohm
Implantable Lead Implant Date: 20160725
Implantable Lead Location: 753860
Implantable Lead Model: 293
Implantable Lead Serial Number: 383593
Implantable Pulse Generator Implant Date: 20160725
Lead Channel Impedance Value: 342 Ohm
Lead Channel Impedance Value: 342 Ohm
Lead Channel Pacing Threshold Amplitude: 0.625 V
Lead Channel Pacing Threshold Pulse Width: 0.4 ms
Lead Channel Sensing Intrinsic Amplitude: 11.375 mV
Lead Channel Sensing Intrinsic Amplitude: 11.375 mV
Lead Channel Setting Pacing Amplitude: 2.5 V
Lead Channel Setting Pacing Pulse Width: 0.4 ms
Lead Channel Setting Sensing Sensitivity: 0.3 mV

## 2020-03-10 NOTE — Telephone Encounter (Signed)
Ok with me 

## 2020-03-10 NOTE — Telephone Encounter (Signed)
Forwarding back to Dr. Curt Bears for his written approval of MD switch per pt request

## 2020-03-13 NOTE — Progress Notes (Signed)
Remote ICD transmission.   

## 2020-03-30 ENCOUNTER — Telehealth (HOSPITAL_COMMUNITY): Payer: Self-pay

## 2020-03-30 NOTE — Telephone Encounter (Signed)
David Bright called wanting to know if he was suppose to be taking spironolactone. Per his last ov note(10-19-19) he is suppose to be taking spironolactone 25mg  QD. He states he thought it was stopped. Per his last note the only medication that was stopped was Corlanor. Patient was very appreciative and stated he would make the necessary changes.

## 2020-04-03 ENCOUNTER — Ambulatory Visit (INDEPENDENT_AMBULATORY_CARE_PROVIDER_SITE_OTHER): Payer: Medicare Other

## 2020-04-03 DIAGNOSIS — Z95810 Presence of automatic (implantable) cardiac defibrillator: Secondary | ICD-10-CM | POA: Diagnosis not present

## 2020-04-03 DIAGNOSIS — I5042 Chronic combined systolic (congestive) and diastolic (congestive) heart failure: Secondary | ICD-10-CM | POA: Diagnosis not present

## 2020-04-05 NOTE — Progress Notes (Addendum)
EPIC Encounter for ICM Monitoring  Patient Name: David Bright is a 59 y.o. male Date: 04/05/2020 Primary Care Physican: Stacie Glaze, DO Primary Cardiologist:Nelson/McLean  Electrophysiologist:Klein 8/25/2021Weight: 299.4lbs   Spoke with patient and reports feeling well at this time. Denies fluid symptoms.   Optivolthoracic impedancesuggesting close tonormal.  Prescribed:   Spironolactone 25 mg take 1 tablet daily  Furosemide 80 mg take 1 tablet (80 mg total) daily.  Labs: 01/19/2020 Creatinine 1.24, BUN 23, Potassium 4.2, Sodium 135, GFR >60 10/19/2019 Creatinine1.64, BUN38, Potassium4.0, Sodium135, [id number] 09/23/2019 Creatinine1.63, BUN50, Potassium4.5, Sodium138, [id number]  09/14/2019 Creatinine1.72, BUN58, Potassium5.4, [id number], [id number] 09/07/2019 Creatinine 2.01, BUN 65, Potassium 4.6, Sodium 134, GFR 35-41 08/24/2019 Creatinine 1.21, BUN 24, Potassium 4.6, Sodium 139, GFR >60 08/16/2019 Creatinine 1.95, BUN 48, Potassium 4.8, Sodium 135, GFR 37-42 A complete set of results can be found in Results Review  Recommendations: Reinforced limiting salt intake to < 2000 mg daily and fluid intake to 64 oz daily.  Encouraged to call if experiencing fluid symptoms.  Follow-up plan: ICM clinic phone appointment on 05/08/2020.   91 day device clinic remote transmission 06/08/2020.    EP/Cardiology Office Visits: 06/05/2020 with Dr Curt Bears in [facility].  Recall for 04/16/2020 with Dr. Aundra Dubin.    Copy of ICM check sent to Dr. Caryl Comes.   3 month ICM trend: 04/03/2020    1 Year ICM trend:       [name], RN 04/05/2020 1:50 PM

## 2020-05-08 ENCOUNTER — Ambulatory Visit (INDEPENDENT_AMBULATORY_CARE_PROVIDER_SITE_OTHER): Payer: Medicare Other

## 2020-05-08 DIAGNOSIS — I5042 Chronic combined systolic (congestive) and diastolic (congestive) heart failure: Secondary | ICD-10-CM | POA: Diagnosis not present

## 2020-05-08 DIAGNOSIS — Z95810 Presence of automatic (implantable) cardiac defibrillator: Secondary | ICD-10-CM

## 2020-05-10 NOTE — Progress Notes (Signed)
EPIC Encounter for ICM Monitoring  Patient Name: David Bright is a 59 y.o. male Date: 05/10/2020 Primary Care Physican: Stacie Glaze, DO Primary Cardiologist:Nelson/McLean  Electrophysiologist:Klein (switching to Dr Curt Bears in [facility] point office) 8/25/2021Weight: 300lbs   Spoke with patient and reports feeling well at this time.  Denies fluid symptoms.     Optivolthoracic impedancesuggesting normal fluid levels.  Prescribed:   Spironolactone 25 mg take 1 tablet daily  Furosemide 80 mg take 1 tablet (80 mg total) daily.  Labs: 01/19/2020 Creatinine 1.24, BUN 23, Potassium 4.2, Sodium 135, GFR >60 10/19/2019 Creatinine1.64, BUN38, Potassium4.0, Sodium135, [id number] 09/23/2019 Creatinine1.63, BUN50, Potassium4.5, Sodium138, [id number]  09/14/2019 Creatinine1.72, BUN58, Potassium5.4, [id number], [id number] 09/07/2019 Creatinine 2.01, BUN 65, Potassium 4.6, Sodium 134, GFR 35-41 08/24/2019 Creatinine 1.21, BUN 24, Potassium 4.6, Sodium 139, GFR >60 08/16/2019 Creatinine 1.95, BUN 48, Potassium 4.8, Sodium 135, GFR 37-42 A complete set of results can be found in Results Review  Recommendations:No changes and encouraged to call if experiencing any fluid symptoms.  Follow-up plan: ICM clinic phone appointment on11/03/2020. 91 day device clinic remote transmission 06/08/2020.   EP/Cardiology Office Visits:06/05/2020 with Dr Curt Bears in [facility]  Recall for 9/5/2021with Dr.McLean.   Copy of ICM check sent to [facility].    3 month ICM trend: 05/08/2020    1 Year ICM trend:     [name], RN 05/10/2020 11:11 AM

## 2020-06-05 ENCOUNTER — Encounter: Payer: Self-pay | Admitting: Cardiology

## 2020-06-05 ENCOUNTER — Ambulatory Visit (INDEPENDENT_AMBULATORY_CARE_PROVIDER_SITE_OTHER): Payer: Medicare Other | Admitting: Cardiology

## 2020-06-05 ENCOUNTER — Other Ambulatory Visit: Payer: Self-pay

## 2020-06-05 VITALS — BP 102/68 | HR 93 | Ht 72.0 in | Wt 299.0 lb

## 2020-06-05 DIAGNOSIS — I5022 Chronic systolic (congestive) heart failure: Secondary | ICD-10-CM | POA: Diagnosis not present

## 2020-06-05 NOTE — Progress Notes (Signed)
Electrophysiology Office Note   Date:  06/05/2020   ID:  David Bright, DOB 1960/12/12, MRN [id number]  PCP:  Stacie Glaze, DO  Cardiologist: Algernon Huxley Primary Electrophysiologist:  Rebekka Lobello Meredith Leeds, MD    Chief Complaint: CHF   History of Present Illness: David Bright is a 59 y.o. male who is being seen today for the evaluation of CHF at the request of Stacie Glaze, DO. Presenting today for electrophysiology evaluation.  He has a history of chronic systolic heart failure, COPD with active smoking, hypertension, hyperlipidemia, obstructive sleep apnea, and type 2 diabetes.  He is status post Medtronic ICD implanted 03/07/2015 by Dr. Caryl Comes.  Today, he denies symptoms of palpitations, chest pain, shortness of breath, orthopnea, PND, lower extremity edema, claudication, dizziness, presyncope, syncope, bleeding, or neurologic sequela. The patient is tolerating medications without difficulties.    Past Medical History:  Diagnosis Date  . Chronic combined systolic and diastolic CHF (congestive heart failure) (HCC)    Systolic and diastolic  . COPD (chronic obstructive pulmonary disease) ([place])   . Hyperlipidemia   . Hypertension   . Non compliance with medical treatment   . Non-ischemic cardiomyopathy ([place])    a. Normal cath 2002;  b. 01/2015 Echo: EF 15-20%, mod conc LVH, restrictive physiology, mod MR, sev dil LA, triv TR/PI;  c. 02/2015 Cath: LM nl, LAD 30d, LCX nl, OM1 small, nl, RCA nl;  d. 02/2015 s/p MDT single lead AICD (ser# [id number]).  . OSA (obstructive sleep apnea) 06/04/2015   Mild with AHI 12.3/hr and AHI 24.5/hr in REM sleep.  . Polysubstance abuse ([place])    History of quitting alcohol, marijuana and crack over 12 years ago  . Type II diabetes mellitus ([place])    Past Surgical History:  Procedure Laterality Date  . CARDIAC CATHETERIZATION N/A 03/06/2015   Procedure: Right/Left Heart Cath and Coronary Angiography;  Surgeon: Belva Crome, MD;  Location:  [facility];  Service: Cardiovascular;  Laterality: N/A;  . EP IMPLANTABLE DEVICE N/A 03/06/2015   Procedure: ICD Implant;  Surgeon: Deboraha Sprang, MD;  Location: [facility];  Service: Cardiovascular;  Laterality: N/A;  . none       Current Outpatient Medications  Medication Sig Dispense Refill  . aspirin EC 81 MG tablet Take 1 tablet (81 mg total) by mouth daily. 30 tablet 0  . atorvastatin (LIPITOR) 20 MG tablet Take 1 tablet (20 mg total) by mouth daily. 30 tablet 3  . BIDIL 20-37.5 MG tablet TAKE 1 TABLET BY MOUTH THREE TIMES DAILY 270 tablet 3  . carvedilol (COREG) 6.25 MG tablet Take 1 tablet (6.25 mg total) by mouth 2 (two) times daily with a meal. 180 tablet 3  . ENTRESTO 97-103 MG Take 1 tablet by mouth twice daily 180 tablet 3  . furosemide (LASIX) 80 MG tablet Take 80 mg by mouth daily.    . insulin lispro (HUMALOG) 100 UNIT/ML injection Inject into the skin.    Marland Kitchen LANTUS SOLOSTAR 100 UNIT/ML Solostar Pen Inject 20 Units into the skin at bedtime.    . mesalamine (LIALDA) 1.2 g EC tablet Take 1.2 g 2 (two) times daily by mouth.    . sitaGLIPtin-metformin (JANUMET) 50-1000 MG tablet Take 1 tablet by mouth 2 (two) times daily with a meal.    . spironolactone (ALDACTONE) 25 MG tablet Take 1 tablet by mouth once daily 90 tablet 3  . Vitamin D, Ergocalciferol, (DRISDOL) 1.25 MG (50000 UNIT) CAPS  capsule Take 50,000 Units by mouth once a week.     No current facility-administered medications for this visit.    Allergies:   Patient has no known allergies.   Social History:  The patient  reports that he has been smoking cigarettes. He has a 33.00 pack-year smoking history. He has never used smokeless tobacco. He reports that he does not drink alcohol and does not use drugs.   Family History:  The patient's family history includes Cancer in his mother.    ROS:  Please see the history of present illness.   Otherwise, review of systems is positive for none.   All other  systems are reviewed and negative.    PHYSICAL EXAM: VS:  BP 102/68   Pulse 93   Ht 6' (1.829 m)   Wt 299 lb (135.6 kg)   SpO2 95%   BMI 40.55 kg/m  , BMI Body mass index is 40.55 kg/m. GEN: Well nourished, well developed, in no acute distress  HEENT: normal  Neck: no JVD, carotid bruits, or masses Cardiac: RRR; no murmurs, rubs, or gallops,no edema  Respiratory:  clear to auscultation bilaterally, normal work of breathing GI: soft, nontender, nondistended, + BS MS: no deformity or atrophy  Skin: warm and dry, device pocket is well healed Neuro:  Strength and sensation are intact Psych: euthymic mood, full affect  EKG:  EKG is ordered today. Personal review of the ekg ordered shows sinus rhythm, right atrial enlargement, rate 93  Device interrogation is reviewed today in detail.  See PaceArt for details.   Recent Labs: 07/26/2019: B Natriuretic Peptide 45.1 01/19/2020: BUN 23; Creatinine, Ser 1.24; Potassium 4.2; Sodium 135    Lipid Panel     Component Value Date/Time   CHOL 125 10/19/2019 1220   TRIG 117 10/19/2019 1220   HDL 38 (L) 10/19/2019 1220   CHOLHDL 3.3 10/19/2019 1220   VLDL 23 10/19/2019 1220   LDLCALC 64 10/19/2019 1220     Wt Readings from Last 3 Encounters:  06/05/20 299 lb (135.6 kg)  10/19/19 (!) 302 lb 9.6 oz (137.3 kg)  08/24/19 296 lb (134.3 kg)      Other studies Reviewed: Additional studies/ records that were reviewed today include: TTE 10/19/2019 Review of the above records today demonstrates:  1. Left ventricular ejection fraction, by estimation, is 55 to 60%. The  left ventricle has normal function. The left ventricle has no regional  wall motion abnormalities. There is mild concentric left ventricular  hypertrophy. Left ventricular diastolic  parameters are consistent with Grade I diastolic dysfunction (impaired  relaxation).  2. Right ventricular systolic function is normal. The right ventricular  size is normal. There is normal  pulmonary artery systolic pressure.  3. The mitral valve is normal in structure. No evidence of mitral valve  regurgitation. No evidence of mitral stenosis.  4. The aortic valve is tricuspid. Aortic valve regurgitation is not  visualized. No aortic stenosis is present.  5. The inferior vena cava is normal in size with greater than 50%  respiratory variability, suggesting right atrial pressure of 3 mmHg.    ASSESSMENT AND PLAN:  1.  Chronic systolic heart failure: Currently on Aldactone, Entresto, and Coreg.  Fortunately, his ejection fraction has recovered.  Is status post Medtronic ICD implanted in 2016.  Device functioning appropriately.  No changes.  2.  Obstructive sleep apnea: CPAP compliance encouraged    Current medicines are reviewed at length with the patient today.   The patient does  not have concerns regarding his medicines.  The following changes were made today:  none  Labs/ tests ordered today include:  Orders Placed This Encounter  Procedures  . EKG 12-Lead     Disposition:   FU with Tavares Levinson 1 year  Signed, Di Jasmer Meredith Leeds, MD  06/05/2020 2:46 PM     [address] [place] [address] [phone] (office) [phone] (fax)

## 2020-06-06 ENCOUNTER — Ambulatory Visit (HOSPITAL_COMMUNITY)
Admission: RE | Admit: 2020-06-06 | Discharge: 2020-06-06 | Disposition: A | Discharge status: Home / Self Care | Payer: Medicare Other | Source: Ambulatory Visit | Attending: Cardiology | Admitting: Cardiology

## 2020-06-06 ENCOUNTER — Encounter (HOSPITAL_COMMUNITY): Payer: Self-pay | Admitting: Cardiology

## 2020-06-06 VITALS — BP 104/70 | HR 94 | Wt 299.0 lb

## 2020-06-06 DIAGNOSIS — E785 Hyperlipidemia, unspecified: Secondary | ICD-10-CM | POA: Insufficient documentation

## 2020-06-06 DIAGNOSIS — E1122 Type 2 diabetes mellitus with diabetic chronic kidney disease: Secondary | ICD-10-CM | POA: Diagnosis not present

## 2020-06-06 DIAGNOSIS — Z8249 Family history of ischemic heart disease and other diseases of the circulatory system: Secondary | ICD-10-CM | POA: Insufficient documentation

## 2020-06-06 DIAGNOSIS — Z794 Long term (current) use of insulin: Secondary | ICD-10-CM | POA: Insufficient documentation

## 2020-06-06 DIAGNOSIS — Z9989 Dependence on other enabling machines and devices: Secondary | ICD-10-CM | POA: Diagnosis not present

## 2020-06-06 DIAGNOSIS — I5022 Chronic systolic (congestive) heart failure: Secondary | ICD-10-CM | POA: Diagnosis not present

## 2020-06-06 DIAGNOSIS — G4733 Obstructive sleep apnea (adult) (pediatric): Secondary | ICD-10-CM | POA: Insufficient documentation

## 2020-06-06 DIAGNOSIS — I5042 Chronic combined systolic (congestive) and diastolic (congestive) heart failure: Secondary | ICD-10-CM

## 2020-06-06 DIAGNOSIS — J449 Chronic obstructive pulmonary disease, unspecified: Secondary | ICD-10-CM | POA: Diagnosis not present

## 2020-06-06 DIAGNOSIS — Z7982 Long term (current) use of aspirin: Secondary | ICD-10-CM | POA: Insufficient documentation

## 2020-06-06 DIAGNOSIS — I130 Hypertensive heart and chronic kidney disease with heart failure and stage 1 through stage 4 chronic kidney disease, or unspecified chronic kidney disease: Secondary | ICD-10-CM | POA: Insufficient documentation

## 2020-06-06 DIAGNOSIS — F17210 Nicotine dependence, cigarettes, uncomplicated: Secondary | ICD-10-CM | POA: Insufficient documentation

## 2020-06-06 DIAGNOSIS — I739 Peripheral vascular disease, unspecified: Secondary | ICD-10-CM | POA: Diagnosis not present

## 2020-06-06 DIAGNOSIS — N183 Chronic kidney disease, stage 3 unspecified: Secondary | ICD-10-CM | POA: Diagnosis not present

## 2020-06-06 DIAGNOSIS — Z95810 Presence of automatic (implantable) cardiac defibrillator: Secondary | ICD-10-CM | POA: Diagnosis not present

## 2020-06-06 DIAGNOSIS — I428 Other cardiomyopathies: Secondary | ICD-10-CM | POA: Diagnosis not present

## 2020-06-06 DIAGNOSIS — Z79899 Other long term (current) drug therapy: Secondary | ICD-10-CM | POA: Insufficient documentation

## 2020-06-06 DIAGNOSIS — E1151 Type 2 diabetes mellitus with diabetic peripheral angiopathy without gangrene: Secondary | ICD-10-CM | POA: Insufficient documentation

## 2020-06-06 LAB — BASIC METABOLIC PANEL
Anion gap: 11 (ref 5–15)
BUN: 34 mg/dL — ABNORMAL HIGH (ref 6–20)
CO2: 24 mmol/L (ref 22–32)
Calcium: 9.3 mg/dL (ref 8.9–10.3)
Chloride: 102 mmol/L (ref 98–111)
Creatinine, Ser: 1.43 mg/dL — ABNORMAL HIGH (ref 0.61–1.24)
GFR, Estimated: 56 mL/min — ABNORMAL LOW (ref >60)
Glucose, Bld: 269 mg/dL — ABNORMAL HIGH (ref 70–99)
Potassium: 4.1 mmol/L (ref 3.5–5.1)
Sodium: 137 mmol/L (ref 135–145)

## 2020-06-06 MED ORDER — BUPROPION HCL ER (SR) 150 MG PO TB12: ORAL_TABLET | ORAL | 2 refills | Status: DC

## 2020-06-06 NOTE — Patient Instructions (Signed)
Start Wellbutrin 150 mg Daily FOR 3 DAYS, then increase to 150 mg Twice daily   Lab done today, we will call you for abnormal results  Please call our office in 1 year to schedule your follow up appointment  If you have any questions or concerns before your next appointment please send [place] a message through [place] or call our office at [phone].    TO LEAVE A MESSAGE FOR THE NURSE SELECT OPTION 2, PLEASE LEAVE A MESSAGE INCLUDING: . YOUR NAME . DATE OF BIRTH . CALL BACK NUMBER . REASON FOR CALL**this is important as we prioritize the call backs  [place] AS LONG AS YOU CALL BEFORE 4:00 PM  At the [facility], you and your health needs are our priority. As part of our continuing mission to provide you with exceptional heart care, we have created designated Provider Care Teams. These Care Teams include your primary Cardiologist (physician) and Advanced Practice Providers (APPs- Physician Assistants and Nurse Practitioners) who all work together to provide you with the care you need, when you need it.   You may see any of the following providers on your designated Care Team at your next follow up: Marland Kitchen Dr Glori Bickers . Dr Loralie Champagne . Darrick Grinder, NP . Lyda Jester, PA . Audry Riles, PharmD   Please be sure to bring in all your medications bottles to every appointment.

## 2020-06-07 NOTE — Progress Notes (Signed)
ID:  David Bright, DOB 1961-03-12, MRN [id number]   Provider location:  Advanced Heart Failure Type of Visit: Established patient   PCP:  Stacie Glaze, DO  HF Cardiology: Dr. Aundra Dubin   History of Present Illness: David Bright is a 59 y.o. male who has a history of chronic systolic CHF and COPD/active smoking.  He has a known nonischemic cardiomyopathy, echo in 1/17 with EF 20% and a severely dilated LV.  No coronary disease on 7/16 cardiac cath.  Repeat echo 2/18, showing EF up to 40%.   He had significant PAD by peripheral arterial dopplers, saw Dr. Gwenlyn Found with plan of medical management for now. He also has knee pain which is limiting.   Echo 09/2017: EF up to 50-55% with mild LVH. Echo in 1/20 showed EF 55-60%.  Echo in 3/21 showed EF 55-60% with normal RV.   He returns for followup of CHF.  Weight is down 3 lbs.  He is still smoking 10 cigs/day.  No exertional dyspnea or chest pain.  Still gets fatigued with a lot of walking.  No claudication.  Using CPAP.  No lightheadedness.    Labs (12/17): K 4.3, creatinine 1.09 Labs (1/18): K 3.9, creatinine 1.13 Labs (3/18): K 4.3, creatinine 1.48, NT-proBNP 61 Labs (4/18): K 4.5, creatinine 1.23, LDL 71, BNP 34 Labs (5/18): K 4.5, creatinine 1.2 Labs (1/19): K 4.2, creatinine 1.19, LDL 69, HDL 39 Labs (11/19): LDL 66, HDL 34, K 3.3, creatinine 1.19, BNP 45 Labs (12/19): K 4.4, creatinine 1.59 Labs (8/20): K 4.5, creatinine 1.66 Labs (2/21): K 4.5, creatinine 1.63 Labs (3/21): LDL 64, HDL 38 Labs (6/21): K 4.2, creatinine 1.24  PMH:  1. Chronic systolic CHF: Nonischemic cardiomyopathy.  Has Medtronic ICD.   - LHC (7/16) with nonobstructive disease.  - Echo (1/17) with EF 20%, severe LV dilation, mild to moderate MR.  - Echo (2/18): EF 40%, mild LV dilation.  - Echo (5/19): EF 50-55%, mild LVH - Echo (1/20): EF 55-60%, mild LVH, normal RV size and systolic function.  - Echo (3/20): EF 50-55%, RV normal, IVC normal -  Echo (3/21): EF 55-60%, mild LVH 2. COPD: Active smoker.  - PFTs (2/18) with minimal obstruction, mild restriction suggesting a parenchymal process, low DLCO.  - CT chest (6/18): No ILD, +emphysema, lung nodules.  - CT chest (11/19): Emphysema, no ILD, stable nodules 3. Type II diabetes with peripheral neuropathy.  4. OSA on CPAP 5. HTN 6. Hyperlipidemia 7. PAD: ABIs abnormal at outside office.  - Peripheral arterial dopplers (3/18) with > 50% left CIA and bilateral EIA stenosis, 50-74% mid LSFA stenosis.  - ABIs (2/19): 0.85 right, 0.86 left (mild).  - ABIs (5/20): 0.89 right, 0.86 left (mild).  - ABIs (5/21): 0.9 right, 0.84 left (mild) 8. CKD: Stage 3.  9. B12 deficiency.   SH: Lives in [place], works at [employer] and [employer], smokes about 4-5 cigs/day now.  Rare ETOH.  Remote substance abuse.   FH: Aunt with CHF, brother with ESRD.    Review of systems complete and found to be negative unless listed in HPI.   Current Outpatient Medications  Medication Sig Dispense Refill  . aspirin EC 81 MG tablet Take 1 tablet (81 mg total) by mouth daily. 30 tablet 0  . atorvastatin (LIPITOR) 20 MG tablet Take 1 tablet (20 mg total) by mouth daily. 30 tablet 3  . BIDIL 20-37.5 MG tablet TAKE 1 TABLET BY MOUTH THREE TIMES DAILY 270 tablet  3  . carvedilol (COREG) 6.25 MG tablet Take 1 tablet (6.25 mg total) by mouth 2 (two) times daily with a meal. 180 tablet 3  . ENTRESTO 97-103 MG Take 1 tablet by mouth twice daily 180 tablet 3  . furosemide (LASIX) 40 MG tablet Take 40 mg by mouth daily. Pt states takes 1 1/2 tabs    . LANTUS SOLOSTAR 100 UNIT/ML Solostar Pen Inject 20 Units into the skin at bedtime.    . mesalamine (LIALDA) 1.2 g EC tablet Take 1.2 g 2 (two) times daily by mouth.    . spironolactone (ALDACTONE) 25 MG tablet Take 1 tablet by mouth once daily 90 tablet 3  . Vitamin D, Ergocalciferol, (DRISDOL) 1.25 MG (50000 UNIT) CAPS capsule Take 50,000 Units by mouth once a week.    Marland Kitchen  buPROPion (WELLBUTRIN SR) 150 MG 12 hr tablet Take 1 tablet (150 mg total) by mouth daily for 3 days, THEN 1 tablet (150 mg total) 2 (two) times daily. 60 tablet 2   No current facility-administered medications for this encounter.   Exam:   BP 104/70   Pulse 94   Wt 135.6 kg (299 lb)   SpO2 99%   BMI 40.55 kg/m  General: NAD Neck: Thick. No JVD, no thyromegaly or thyroid nodule.  Lungs: Clear to auscultation bilaterally with normal respiratory effort. CV: Nondisplaced PMI.  Heart regular S1/S2, no S3/S4, no murmur.  No peripheral edema.  No carotid bruit.  Unable to palpate pedal pulses.  Abdomen: Soft, nontender, no hepatosplenomegaly, no distention.  Skin: Intact without lesions or rashes.  Neurologic: Alert and oriented x 3.  Psych: Normal affect. Extremities: No clubbing or cyanosis.  HEENT: Normal.   Assessment/Plan: 1. Chronic systolic CHF: Nonischemic cardiomyopathy => ?due to viral myocarditis or prior substance abuse.  Medtronic ICD.  Echo 1/17 with severely dilated LV and EF 20%, EF up to 40% on echo 2/18.  Echo in 1/20 showed EF up to 55% with normal RV, and echo in 3/21 showed EF 55-60%.  NYHA class I-II symptoms recently.  He is not volume overloaded.   - Continue current Lasix dosing. I will arrange for a BMET.  - Continue spironolactone 25 mg daily, Entresto 97/103 bid, Bidil 1 tab tid, Coreg 6.25 mg bid.  - Low sodium diet.  2. Smoking/suspect COPD:  PFTs done, somewhat concerning for interstitial lung disease rather than emphysema with restrictive PFTs and decreased DLCO => no ILD on CT, emphysema noted along with lung nodules. Most recent repeat CT in 11/19 showed stable nodules, do not need repeat. He continues to smoke about 10 cigarettes/day.   - He will try Wellbutrin to help him quit.  3. OSA: Continue CPAP. No change.  4. PAD: Significant disease on peripheral dopplers, stable mild claudication. He saw Dr. Gwenlyn Found, medical management recommended.  I encouraged him  to try to walk at least a short distance through the pain. ABIs in 5/21 mildly reduced.  - Continue ASA 81 and statin. Good lipids in 3/21.   - He will need followup with Dr. Gwenlyn Found for [place].   Followup in 1 year.  Will need BMET q3 months based on his meds.   Signed, Loralie Champagne, MD  06/07/2020  [facility] [address] [facility] [place] [address] [phone] (office) [phone] (fax)

## 2020-06-08 ENCOUNTER — Ambulatory Visit (INDEPENDENT_AMBULATORY_CARE_PROVIDER_SITE_OTHER): Payer: Medicare Other

## 2020-06-08 DIAGNOSIS — I428 Other cardiomyopathies: Secondary | ICD-10-CM | POA: Diagnosis not present

## 2020-06-08 LAB — CUP PACEART REMOTE DEVICE CHECK
Battery Remaining Longevity: 87 mo
Battery Voltage: 3 V
Brady Statistic RV Percent Paced: 0 %
Date Time Interrogation Session: 20211028001602
HighPow Impedance: 71 Ohm
Implantable Lead Implant Date: 20160725
Implantable Lead Location: 753860
Implantable Lead Model: 293
Implantable Lead Serial Number: 383593
Implantable Pulse Generator Implant Date: 20160725
Lead Channel Impedance Value: 380 Ohm
Lead Channel Impedance Value: 380 Ohm
Lead Channel Pacing Threshold Amplitude: 0.875 V
Lead Channel Pacing Threshold Pulse Width: 0.4 ms
Lead Channel Sensing Intrinsic Amplitude: 11.625 mV
Lead Channel Sensing Intrinsic Amplitude: 11.625 mV
Lead Channel Setting Pacing Amplitude: 2.5 V
Lead Channel Setting Pacing Pulse Width: 0.4 ms
Lead Channel Setting Sensing Sensitivity: 0.3 mV

## 2020-06-13 NOTE — Progress Notes (Signed)
Remote ICD transmission.   

## 2020-06-19 ENCOUNTER — Ambulatory Visit (INDEPENDENT_AMBULATORY_CARE_PROVIDER_SITE_OTHER): Payer: Medicare Other

## 2020-06-19 DIAGNOSIS — I5042 Chronic combined systolic (congestive) and diastolic (congestive) heart failure: Secondary | ICD-10-CM

## 2020-06-19 DIAGNOSIS — Z95810 Presence of automatic (implantable) cardiac defibrillator: Secondary | ICD-10-CM

## 2020-06-23 NOTE — Progress Notes (Signed)
EPIC Encounter for ICM Monitoring  Patient Name: David Bright is a 59 y.o. male Date: 06/23/2020 Primary Care Physican: Stacie Glaze, DO Primary Cardiologist:Nelson/McLean  Electrophysiologist:Klein (switching to Dr Curt Bears in [facility] point office) 11/12/2021Weight: 298.3lbs   Spoke with patient and reports feeling well at this time.  Denies fluid symptoms.     Optivolthoracic impedancesuggesting normal fluid levels.  Prescribed:   Spironolactone 25 mg take 1 tablet daily  Furosemide 40 mg take 1 tablet (40 mg total) daily.  Labs: 06/06/2020 Creatinine 1.43, BUN 34, Potassium 4.1, Sodium 137, GFR 56 01/19/2020 Creatinine 1.24, BUN 23, Potassium 4.2, Sodium 135, GFR >60 10/19/2019 Creatinine1.64, BUN38, Potassium4.0, Sodium135, GFR45-52 09/23/2019 Creatinine1.63, BUN50, Potassium4.5, Sodium138, GFR45-53  09/14/2019 Creatinine1.72, BUN58, Potassium5.4, [id number], WPT00-34 09/07/2019 Creatinine 2.01, BUN 65, Potassium 4.6, Sodium 134, GFR 35-41 08/24/2019 Creatinine 1.21, BUN 24, Potassium 4.6, Sodium 139, GFR >60 08/16/2019 Creatinine 1.95, BUN 48, Potassium 4.8, Sodium 135, GFR 37-42 A complete set of results can be found in Results Review  Recommendations:No changes and encouraged to call if experiencing any fluid symptoms.  Follow-up plan: ICM clinic phone appointment on12/13/2021. 91 day device clinic remote transmission1/27/2022.   EP/Cardiology Office Visits:06/01/2021 with Dr Curt Bears in [facility].Recall for 10/26/2022with Dr.McLean.   Copy of ICM check sent to [place].   3 month ICM trend: 06/19/2020    1 Year ICM trend:       [name], RN 06/23/2020 12:12 PM

## 2020-07-24 ENCOUNTER — Ambulatory Visit (INDEPENDENT_AMBULATORY_CARE_PROVIDER_SITE_OTHER): Payer: Medicare Other

## 2020-07-24 DIAGNOSIS — I5042 Chronic combined systolic (congestive) and diastolic (congestive) heart failure: Secondary | ICD-10-CM

## 2020-07-24 DIAGNOSIS — Z95810 Presence of automatic (implantable) cardiac defibrillator: Secondary | ICD-10-CM | POA: Diagnosis not present

## 2020-07-26 ENCOUNTER — Telehealth: Payer: Self-pay

## 2020-07-26 NOTE — Progress Notes (Signed)
EPIC Encounter for ICM Monitoring  Patient Name: David Bright is a 59 y.o. male Date: 07/26/2020 Primary Care Physican: Stacie Glaze, DO Primary Cardiologist:Nelson/McLean  Electrophysiologist:Klein(switching to Dr Curt Bears in [place] point office) 11/12/2021Weight: 298.3lbs   Attempted call to patient and unable to reach.  Left detailed message per DPR regarding transmission. Transmission reviewed.   Optivolthoracic impedancesuggesting normalfluid levels.  Prescribed:   Spironolactone 25 mg take 1 tablet daily  Furosemide 40 mg take 1 tablet (40 mg total) daily.  Labs: 06/06/2020 Creatinine 1.43, BUN 34, Potassium 4.1, Sodium 137, GFR 56 01/19/2020 Creatinine 1.24, BUN 23, Potassium 4.2, Sodium 135, GFR >60 10/19/2019 Creatinine1.64, BUN38, Potassium4.0, Sodium135, GFR45-52 09/23/2019 Creatinine1.63, BUN50, Potassium4.5, Sodium138, GFR45-53  09/14/2019 Creatinine1.72, BUN58, Potassium5.4, [id number], SWF09-32 09/07/2019 Creatinine 2.01, BUN 65, Potassium 4.6, Sodium 134, GFR 35-41 08/24/2019 Creatinine 1.21, BUN 24, Potassium 4.6, Sodium 139, GFR >60 08/16/2019 Creatinine 1.95, BUN 48, Potassium 4.8, Sodium 135, GFR 37-42 A complete set of results can be found in Results Review  Recommendations: Left voice mail with ICM number and encouraged to call if experiencing any fluid symptoms.  Follow-up plan: ICM clinic phone appointment on2/09/2020. 91 day device clinic remote transmission1/27/2022.   EP/Cardiology Office Visits:06/01/2021 with Dr Curt Bears in [facility].Recall for 10/26/2022with Dr.McLean.   Copy of ICM check sent to [place].   3 month ICM trend: 07/24/2020    1 Year ICM trend:       [name], RN 07/26/2020 8:53 AM

## 2020-07-26 NOTE — Telephone Encounter (Signed)
Remote ICM transmission received.  Attempted call to patient regarding ICM remote transmission and left detailed message per DPR.  Advised to return call for any fluid symptoms or questions. Next ICM remote transmission scheduled 09/13/2020.

## 2020-08-30 ENCOUNTER — Other Ambulatory Visit (HOSPITAL_COMMUNITY): Payer: Self-pay | Admitting: Cardiology

## 2020-09-07 ENCOUNTER — Ambulatory Visit (INDEPENDENT_AMBULATORY_CARE_PROVIDER_SITE_OTHER): Payer: Medicare Other

## 2020-09-07 DIAGNOSIS — I428 Other cardiomyopathies: Secondary | ICD-10-CM

## 2020-09-07 LAB — CUP PACEART REMOTE DEVICE CHECK
Battery Remaining Longevity: 83 mo
Battery Voltage: 3 V
Brady Statistic RV Percent Paced: 0 %
Date Time Interrogation Session: 20220127042304
HighPow Impedance: 68 Ohm
Implantable Lead Implant Date: 20160725
Implantable Lead Location: 753860
Implantable Lead Model: 293
Implantable Lead Serial Number: 383593
Implantable Pulse Generator Implant Date: 20160725
Lead Channel Impedance Value: 342 Ohm
Lead Channel Impedance Value: 380 Ohm
Lead Channel Pacing Threshold Amplitude: 0.75 V
Lead Channel Pacing Threshold Pulse Width: 0.4 ms
Lead Channel Sensing Intrinsic Amplitude: 10.75 mV
Lead Channel Sensing Intrinsic Amplitude: 10.75 mV
Lead Channel Setting Pacing Amplitude: 2.5 V
Lead Channel Setting Pacing Pulse Width: 0.4 ms
Lead Channel Setting Sensing Sensitivity: 0.3 mV

## 2020-09-13 ENCOUNTER — Ambulatory Visit (INDEPENDENT_AMBULATORY_CARE_PROVIDER_SITE_OTHER): Payer: Medicare Other

## 2020-09-13 DIAGNOSIS — Z95810 Presence of automatic (implantable) cardiac defibrillator: Secondary | ICD-10-CM

## 2020-09-13 DIAGNOSIS — I5042 Chronic combined systolic (congestive) and diastolic (congestive) heart failure: Secondary | ICD-10-CM | POA: Diagnosis not present

## 2020-09-18 NOTE — Progress Notes (Signed)
Remote ICD transmission.   

## 2020-09-18 NOTE — Progress Notes (Signed)
EPIC Encounter for ICM Monitoring  Patient Name: David Bright is a 60 y.o. male Date: 09/18/2020 Primary Care Physican: Stacie Glaze, DO Primary Cardiologist:Nelson/McLean  Electrophysiologist: Curt Bears  09/18/2020 Weight: 298.3lbs   Spoke with patient and reports feeling well at this time.  Denies fluid symptoms.    Optivolthoracic impedancesuggesting normalfluid levels.  Prescribed:   Spironolactone 25 mg take 1 tablet daily  Furosemide40 mg take 1 tablet (40 mg total) daily.  Labs: 06/06/2020 Creatinine 1.43, BUN 34, Potassium 4.1, Sodium 137, GFR 56 01/19/2020 Creatinine 1.24, BUN 23, Potassium 4.2, Sodium 135, GFR >60 A complete set of results can be found in Results Review  Recommendations:Recommendation to limit salt intake to 2000 mg daily and fluid intake to 64 oz daily.  Encouraged to call if experiencing any fluid symptoms.   Follow-up plan: ICM clinic phone appointment on3/02/2021. 91 day device clinic remote transmission4/28/2022.   EP/Cardiology Office Visits:Recall 10/21/2022with Dr Curt Bears in [facility].Recall for10/26/2022with Dr.McLean.   Copy of ICM check sent to [place].  3 month ICM trend: 09/13/2020.    1 Year ICM trend:       [name], RN 09/18/2020 4:01 PM

## 2020-10-16 ENCOUNTER — Ambulatory Visit (INDEPENDENT_AMBULATORY_CARE_PROVIDER_SITE_OTHER): Payer: Medicare Other

## 2020-10-16 DIAGNOSIS — Z95810 Presence of automatic (implantable) cardiac defibrillator: Secondary | ICD-10-CM | POA: Diagnosis not present

## 2020-10-16 DIAGNOSIS — I5042 Chronic combined systolic (congestive) and diastolic (congestive) heart failure: Secondary | ICD-10-CM

## 2020-10-18 NOTE — Progress Notes (Signed)
EPIC Encounter for ICM Monitoring  Patient Name: David Bright is a 59 y.o. male Date: 10/18/2020 Primary Care Physican: Stacie Glaze, DO Primary Cardiologist:Nelson/McLean  Electrophysiologist: Curt Bears  09/18/2020 Weight: 298.3lbs   Spoke with patient and reports feeling well at this time.  Denies fluid symptoms.    Optivolthoracic impedancesuggesting normalfluid levels.  Prescribed:   Spironolactone 25 mg take 1 tablet daily  Furosemide40 mg take 1 tablet (40 mg total) daily.  Labs: 06/06/2020 Creatinine 1.43, BUN 34, Potassium 4.1, Sodium 137, GFR 56 01/19/2020 Creatinine 1.24, BUN 23, Potassium 4.2, Sodium 135, GFR >60 A complete set of results can be found in Results Review  Recommendations: Encouraged to call if experiencing any fluid symptoms.   Follow-up plan: ICM clinic phone appointment on4/06/2021. 91 day device clinic remote transmission4/28/2022.   EP/Cardiology Office Visits:Recall 10/21/2022with Dr Curt Bears in [facility].Recall for10/26/2022with Dr.McLean.   Copy of ICM check sent to [place].  3 month ICM trend: 10/16/2020.    1 Year ICM trend:       [name], RN 10/18/2020 4:13 PM

## 2020-10-21 ENCOUNTER — Other Ambulatory Visit (HOSPITAL_COMMUNITY): Payer: Self-pay | Admitting: Cardiology

## 2020-11-20 ENCOUNTER — Ambulatory Visit (INDEPENDENT_AMBULATORY_CARE_PROVIDER_SITE_OTHER): Payer: Medicare Other

## 2020-11-20 DIAGNOSIS — I5042 Chronic combined systolic (congestive) and diastolic (congestive) heart failure: Secondary | ICD-10-CM | POA: Diagnosis not present

## 2020-11-20 DIAGNOSIS — Z95810 Presence of automatic (implantable) cardiac defibrillator: Secondary | ICD-10-CM

## 2020-11-22 NOTE — Progress Notes (Signed)
EPIC Encounter for ICM Monitoring  Patient Name: David Bright is a 60 y.o. male Date: 11/22/2020 Primary Care Physican: Stacie Glaze, DO Primary Cardiologist:Nelson/McLean  Electrophysiologist:Camnitz  4/13/2022Weight: [phone]   Spoke with patient and reports feeling well at this time. Denies fluid symptoms.   Optivolthoracic impedancesuggesting normalfluid levels.  Prescribed:   Spironolactone 25 mg take 1 tablet daily  Furosemide40 mg take 1 tablet (40 mg total) daily.  Labs: 06/06/2020 Creatinine 1.43, BUN 34, Potassium 4.1, Sodium 137, GFR 56 01/19/2020 Creatinine 1.24, BUN 23, Potassium 4.2, Sodium 135, GFR >60 A complete set of results can be found in Results Review  Recommendations:Encouraged to call if experiencing any fluid symptoms.   Follow-up plan: ICM clinic phone appointment on5/31/2022. 91 day device clinic remote transmission4/28/2022.   EP/Cardiology Office Visits:Recall10/21/2022with Dr Curt Bears in [facility].Recall for10/26/2022with Dr.McLean.   Copy of ICM check sent to [place].  3 month ICM trend: 11/20/2020.    1 Year ICM trend:       [name], RN 11/22/2020 12:26 PM

## 2020-12-07 ENCOUNTER — Ambulatory Visit (INDEPENDENT_AMBULATORY_CARE_PROVIDER_SITE_OTHER): Payer: Medicare Other

## 2020-12-07 DIAGNOSIS — I428 Other cardiomyopathies: Secondary | ICD-10-CM | POA: Diagnosis not present

## 2020-12-07 DIAGNOSIS — I5022 Chronic systolic (congestive) heart failure: Secondary | ICD-10-CM

## 2020-12-07 LAB — CUP PACEART REMOTE DEVICE CHECK
Battery Remaining Longevity: 78 mo
Battery Voltage: 3 V
Brady Statistic RV Percent Paced: 0 %
Date Time Interrogation Session: 20220428001603
HighPow Impedance: 66 Ohm
Implantable Lead Implant Date: 20160725
Implantable Lead Location: 753860
Implantable Lead Model: 293
Implantable Lead Serial Number: 383593
Implantable Pulse Generator Implant Date: 20160725
Lead Channel Impedance Value: 342 Ohm
Lead Channel Impedance Value: 380 Ohm
Lead Channel Pacing Threshold Amplitude: 0.75 V
Lead Channel Pacing Threshold Pulse Width: 0.4 ms
Lead Channel Sensing Intrinsic Amplitude: 12.375 mV
Lead Channel Sensing Intrinsic Amplitude: 12.375 mV
Lead Channel Setting Pacing Amplitude: 2.5 V
Lead Channel Setting Pacing Pulse Width: 0.4 ms
Lead Channel Setting Sensing Sensitivity: 0.3 mV

## 2020-12-19 ENCOUNTER — Other Ambulatory Visit (HOSPITAL_COMMUNITY): Payer: Self-pay | Admitting: Cardiology

## 2020-12-28 NOTE — Progress Notes (Signed)
Remote ICD transmission.   

## 2021-01-09 ENCOUNTER — Ambulatory Visit (INDEPENDENT_AMBULATORY_CARE_PROVIDER_SITE_OTHER): Payer: Medicare Other

## 2021-01-09 DIAGNOSIS — I5022 Chronic systolic (congestive) heart failure: Secondary | ICD-10-CM | POA: Diagnosis not present

## 2021-01-09 DIAGNOSIS — Z95810 Presence of automatic (implantable) cardiac defibrillator: Secondary | ICD-10-CM | POA: Diagnosis not present

## 2021-01-10 NOTE — Progress Notes (Signed)
EPIC Encounter for ICM Monitoring  Patient Name: David Bright is a 60 y.o. male Date: 01/10/2021 Primary Care Physican: Stacie Glaze, DO Primary Cardiologist:Nelson/McLean  Electrophysiologist:Camnitz  6/1/2022Weight: 997.7SFS   Spoke with patient and reports feeling well at this time. Denies fluid symptoms.   Optivolthoracic impedancesuggesting normalfluid levels.  Prescribed:   Spironolactone 25 mg take 1 tablet daily  Furosemide40 mg take 1 tablet (40 mg total) daily.  Labs: 06/06/2020 Creatinine 1.43, BUN 34, Potassium 4.1, Sodium 137, GFR 56 01/19/2020 Creatinine 1.24, BUN 23, Potassium 4.2, Sodium 135, GFR >60 A complete set of results can be found in Results Review  Recommendations:Encouraged to call if experiencing any fluid symptoms.   Follow-up plan: ICM clinic phone appointment on7/12/2020. 91 day device clinic remote transmission7/28/2022.   EP/Cardiology Office Visits: Recall10/21/2022with Dr Curt Bears in [facility].Recall for10/26/2022with Dr.McLean.   Copy of ICM check sent to [facility].  3 month ICM trend: 01/09/2021.    1 Year ICM trend:           [name], RN 01/10/2021 2:17 PM

## 2021-02-13 ENCOUNTER — Ambulatory Visit (INDEPENDENT_AMBULATORY_CARE_PROVIDER_SITE_OTHER): Payer: Medicare Other

## 2021-02-13 ENCOUNTER — Other Ambulatory Visit (HOSPITAL_COMMUNITY): Payer: Self-pay | Admitting: Cardiology

## 2021-02-13 DIAGNOSIS — Z95810 Presence of automatic (implantable) cardiac defibrillator: Secondary | ICD-10-CM | POA: Diagnosis not present

## 2021-02-13 DIAGNOSIS — I5022 Chronic systolic (congestive) heart failure: Secondary | ICD-10-CM | POA: Diagnosis not present

## 2021-02-14 NOTE — Progress Notes (Signed)
EPIC Encounter for ICM Monitoring  Patient Name: David Bright is a 60 y.o. male Date: 02/14/2021 Primary Care Physican: Stacie Glaze, DO Primary Cardiologist: Nelson/McLean   Electrophysiologist:  Curt Bears  01/10/2021 Weight:  294.5 lbs  VT-NS (>4 beats, >171 bpm) 1         Spoke with patient and heart failure questions reviewed.  Pt asymptomatic for fluid accumulation and feeing well.         Optivol thoracic impedance suggesting normal fluid levels.           Prescribed:              Spironolactone 25 mg take 1 tablet daily Furosemide 40 mg take 1 tablet (40 mg total) daily.   Labs: 06/06/2020 Creatinine 1.43, BUN 34, Potassium 4.1, Sodium 137, GFR 56 01/19/2020 Creatinine 1.24, BUN 23, Potassium 4.2, Sodium 135, GFR >60 A complete set of results can be found in Results Review   Recommendations:  No changes and encouraged to call if experiencing any fluid symptoms.   Follow-up plan: ICM clinic phone appointment on 03/19/2021.   91 day device clinic remote transmission 03/08/2021.     EP/Cardiology Office Visits:  Recall 06/01/2021 with Dr Curt Bears in [facility].  Recall for 06/06/2021 with Dr. Aundra Dubin.     Copy of ICM check sent to Dr. Curt Bears.   3 month ICM trend: 02/13/2021.    1 Year ICM trend:       [name], RN 02/14/2021 8:41 AM

## 2021-03-08 ENCOUNTER — Ambulatory Visit (INDEPENDENT_AMBULATORY_CARE_PROVIDER_SITE_OTHER): Payer: Medicare Other

## 2021-03-08 DIAGNOSIS — I5022 Chronic systolic (congestive) heart failure: Secondary | ICD-10-CM

## 2021-03-08 DIAGNOSIS — I428 Other cardiomyopathies: Secondary | ICD-10-CM

## 2021-03-09 LAB — CUP PACEART REMOTE DEVICE CHECK
Battery Remaining Longevity: 73 mo
Battery Voltage: 3 V
Brady Statistic RV Percent Paced: 0 %
Date Time Interrogation Session: 20220728022723
HighPow Impedance: 69 Ohm
Implantable Lead Implant Date: 20160725
Implantable Lead Location: 753860
Implantable Lead Model: 293
Implantable Lead Serial Number: 383593
Implantable Pulse Generator Implant Date: 20160725
Lead Channel Impedance Value: 399 Ohm
Lead Channel Impedance Value: 437 Ohm
Lead Channel Pacing Threshold Amplitude: 0.75 V
Lead Channel Pacing Threshold Pulse Width: 0.4 ms
Lead Channel Sensing Intrinsic Amplitude: 10.75 mV
Lead Channel Sensing Intrinsic Amplitude: 10.75 mV
Lead Channel Setting Pacing Amplitude: 2.5 V
Lead Channel Setting Pacing Pulse Width: 0.4 ms
Lead Channel Setting Sensing Sensitivity: 0.3 mV

## 2021-03-19 ENCOUNTER — Ambulatory Visit (INDEPENDENT_AMBULATORY_CARE_PROVIDER_SITE_OTHER): Payer: Medicare Other

## 2021-03-19 DIAGNOSIS — I5022 Chronic systolic (congestive) heart failure: Secondary | ICD-10-CM

## 2021-03-19 DIAGNOSIS — Z95810 Presence of automatic (implantable) cardiac defibrillator: Secondary | ICD-10-CM | POA: Diagnosis not present

## 2021-03-20 NOTE — Progress Notes (Signed)
EPIC Encounter for ICM Monitoring  Patient Name: David Bright is a 60 y.o. male Date: 03/20/2021 Primary Care Physican: Stacie Glaze, DO Primary Cardiologist: Nelson/McLean   Electrophysiologist:  Curt Bears  01/10/2021 Weight:  294.5 lbs (not weighing home)           Spoke with patient and heart failure questions reviewed.  Pt asymptomatic for fluid accumulation and feeing well.         Optivol thoracic impedance suggesting normal fluid levels.           Prescribed:              Spironolactone 25 mg take 1 tablet daily Furosemide 40 mg take 1 tablet (40 mg total) daily.   Labs: 06/06/2020 Creatinine 1.43, BUN 34, Potassium 4.1, Sodium 137, GFR 56 01/19/2020 Creatinine 1.24, BUN 23, Potassium 4.2, Sodium 135, GFR >60 A complete set of results can be found in Results Review   Recommendations:  No changes and encouraged to call if experiencing any fluid symptoms.   Follow-up plan: ICM clinic phone appointment on 04/23/2021.   91 day device clinic remote transmission 06/07/2021.     EP/Cardiology Office Visits:  Recall 06/01/2021 with Dr Curt Bears in [facility].  Recall for 06/06/2021 with Dr. Aundra Dubin.     Copy of ICM check sent to Dr. Curt Bears.   3 month ICM trend: 03/19/2021.    1 Year ICM trend:       [name], RN 03/20/2021 4:30 PM

## 2021-04-03 NOTE — Progress Notes (Signed)
Remote ICD transmission.   

## 2021-04-23 ENCOUNTER — Ambulatory Visit (INDEPENDENT_AMBULATORY_CARE_PROVIDER_SITE_OTHER): Payer: Medicare Other

## 2021-04-23 DIAGNOSIS — Z95810 Presence of automatic (implantable) cardiac defibrillator: Secondary | ICD-10-CM | POA: Diagnosis not present

## 2021-04-23 DIAGNOSIS — I5022 Chronic systolic (congestive) heart failure: Secondary | ICD-10-CM

## 2021-04-25 NOTE — Progress Notes (Signed)
EPIC Encounter for ICM Monitoring  Patient Name: David Bright is a 60 y.o. male Date: 04/25/2021 Primary Care Physican: Stacie Glaze, DO Primary Cardiologist: Nelson/McLean   Electrophysiologist:  Curt Bears  04/25/2021 Weight:  290 lbs (not weighing home)           Spoke with patient and heart failure questions reviewed.  Pt asymptomatic for fluid accumulation but was positive from [place].          Optivol thoracic impedance suggesting normal fluid levels.           Prescribed:              Spironolactone 25 mg take 1 tablet daily Furosemide 40 mg take 1 tablet (40 mg total) daily.   Labs: 06/06/2020 Creatinine 1.43, BUN 34, Potassium 4.1, Sodium 137, GFR 56 01/19/2020 Creatinine 1.24, BUN 23, Potassium 4.2, Sodium 135, GFR >60 A complete set of results can be found in Results Review   Recommendations:  No changes and encouraged to call if experiencing any fluid symptoms.   Follow-up plan: ICM clinic phone appointment on 05/28/2021.   91 day device clinic remote transmission 06/07/2021.     EP/Cardiology Office Visits:  Recall 06/01/2021 with Dr Curt Bears in [facility].  Recall for 06/06/2021 with Dr. Aundra Dubin.     Copy of ICM check sent to Dr. Curt Bears.    3 month ICM trend: 04/23/2021.    1 Year ICM trend:       [name], RN 04/25/2021 3:37 PM

## 2021-05-28 ENCOUNTER — Ambulatory Visit (INDEPENDENT_AMBULATORY_CARE_PROVIDER_SITE_OTHER): Payer: Medicare Other

## 2021-05-28 DIAGNOSIS — Z95810 Presence of automatic (implantable) cardiac defibrillator: Secondary | ICD-10-CM | POA: Diagnosis not present

## 2021-05-28 DIAGNOSIS — I5022 Chronic systolic (congestive) heart failure: Secondary | ICD-10-CM | POA: Diagnosis not present

## 2021-05-28 NOTE — Progress Notes (Signed)
EPIC Encounter for ICM Monitoring  Patient Name: David Bright is a 59 y.o. male Date: 05/28/2021 Primary Care Physican: Stacie Glaze, DO Primary Cardiologist: Nelson/McLean   Electrophysiologist:  Curt Bears  05/28/2021 Weight:  290-300 lbs (not weighing home)           Spoke with patient and heart failure questions reviewed.  Pt reports he did have COVID in mid September.    Pt's girlfriend is cooking with a lot of salt and he has been drinking more fluids.           Optivol thoracic impedance trending slightly below baseline normal.           Prescribed:              Spironolactone 25 mg take 1 tablet daily Furosemide 40 mg take 1 tablet (40 mg total) daily.   Labs: 06/06/2020 Creatinine 1.43, BUN 34, Potassium 4.1, Sodium 137, GFR 56 01/19/2020 Creatinine 1.24, BUN 23, Potassium 4.2, Sodium 135, GFR >60 A complete set of results can be found in Results Review   Recommendations:  Discussed making changes to diet and fluid intake.  Advised limit salt intake to 2000 mg daily (< tsp) and fluids to 64 oz daily.  No changes and encouraged to call if experiencing any fluid symptoms.   Follow-up plan: ICM clinic phone appointment on 06/07/2021 to recheck fluid levels   91 day device clinic remote transmission 06/07/2021.     EP/Cardiology Office Visits:  Recall 06/01/2021 with Dr Curt Bears in [facility].  06/07/2021 with Dr. Aundra Dubin.     Copy of ICM check sent to Dr. Curt Bears.    3 month ICM trend: 05/28/2021.    1 Year ICM trend:       [name], RN 05/28/2021 4:45 PM

## 2021-06-07 ENCOUNTER — Ambulatory Visit (INDEPENDENT_AMBULATORY_CARE_PROVIDER_SITE_OTHER): Payer: Medicare Other | Admitting: Cardiology

## 2021-06-07 ENCOUNTER — Ambulatory Visit (HOSPITAL_COMMUNITY)
Admission: RE | Admit: 2021-06-07 | Discharge: 2021-06-07 | Disposition: A | Discharge status: Home / Self Care | Payer: Medicare Other | Source: Ambulatory Visit | Attending: Cardiology | Admitting: Cardiology

## 2021-06-07 ENCOUNTER — Encounter: Payer: Self-pay | Admitting: Cardiology

## 2021-06-07 ENCOUNTER — Other Ambulatory Visit: Payer: Self-pay

## 2021-06-07 ENCOUNTER — Encounter (HOSPITAL_COMMUNITY): Payer: Self-pay | Admitting: Cardiology

## 2021-06-07 ENCOUNTER — Encounter: Payer: Medicare Other | Admitting: Cardiology

## 2021-06-07 ENCOUNTER — Ambulatory Visit (INDEPENDENT_AMBULATORY_CARE_PROVIDER_SITE_OTHER): Payer: Medicare Other

## 2021-06-07 VITALS — BP 100/60 | HR 86 | Wt 290.6 lb

## 2021-06-07 VITALS — BP 130/74 | HR 84 | Ht 72.0 in | Wt 289.4 lb

## 2021-06-07 DIAGNOSIS — Z8249 Family history of ischemic heart disease and other diseases of the circulatory system: Secondary | ICD-10-CM | POA: Insufficient documentation

## 2021-06-07 DIAGNOSIS — E1122 Type 2 diabetes mellitus with diabetic chronic kidney disease: Secondary | ICD-10-CM | POA: Diagnosis not present

## 2021-06-07 DIAGNOSIS — I5022 Chronic systolic (congestive) heart failure: Secondary | ICD-10-CM

## 2021-06-07 DIAGNOSIS — G4733 Obstructive sleep apnea (adult) (pediatric): Secondary | ICD-10-CM | POA: Diagnosis not present

## 2021-06-07 DIAGNOSIS — Z79899 Other long term (current) drug therapy: Secondary | ICD-10-CM | POA: Diagnosis not present

## 2021-06-07 DIAGNOSIS — I130 Hypertensive heart and chronic kidney disease with heart failure and stage 1 through stage 4 chronic kidney disease, or unspecified chronic kidney disease: Secondary | ICD-10-CM | POA: Diagnosis not present

## 2021-06-07 DIAGNOSIS — E1151 Type 2 diabetes mellitus with diabetic peripheral angiopathy without gangrene: Secondary | ICD-10-CM | POA: Insufficient documentation

## 2021-06-07 DIAGNOSIS — Z794 Long term (current) use of insulin: Secondary | ICD-10-CM | POA: Insufficient documentation

## 2021-06-07 DIAGNOSIS — I428 Other cardiomyopathies: Secondary | ICD-10-CM | POA: Insufficient documentation

## 2021-06-07 DIAGNOSIS — F17210 Nicotine dependence, cigarettes, uncomplicated: Secondary | ICD-10-CM | POA: Diagnosis not present

## 2021-06-07 DIAGNOSIS — J449 Chronic obstructive pulmonary disease, unspecified: Secondary | ICD-10-CM | POA: Insufficient documentation

## 2021-06-07 DIAGNOSIS — N183 Chronic kidney disease, stage 3 unspecified: Secondary | ICD-10-CM | POA: Diagnosis not present

## 2021-06-07 DIAGNOSIS — E785 Hyperlipidemia, unspecified: Secondary | ICD-10-CM

## 2021-06-07 DIAGNOSIS — I739 Peripheral vascular disease, unspecified: Secondary | ICD-10-CM

## 2021-06-07 DIAGNOSIS — I5042 Chronic combined systolic (congestive) and diastolic (congestive) heart failure: Secondary | ICD-10-CM

## 2021-06-07 DIAGNOSIS — Z7982 Long term (current) use of aspirin: Secondary | ICD-10-CM | POA: Insufficient documentation

## 2021-06-07 DIAGNOSIS — Z8616 Personal history of COVID-19: Secondary | ICD-10-CM | POA: Diagnosis not present

## 2021-06-07 LAB — CBC
HCT: 45.6 % (ref 39.0–52.0)
Hemoglobin: 14.8 g/dL (ref 13.0–17.0)
MCH: 30.3 pg (ref 26.0–34.0)
MCHC: 32.5 g/dL (ref 30.0–36.0)
MCV: 93.4 fL (ref 80.0–100.0)
Platelets: 220 10*3/uL (ref 150–400)
RBC: 4.88 MIL/uL (ref 4.22–5.81)
RDW: 14.1 % (ref 11.5–15.5)
WBC: 5.3 10*3/uL (ref 4.0–10.5)
nRBC: 0 % (ref 0.0–0.2)

## 2021-06-07 LAB — CUP PACEART REMOTE DEVICE CHECK
Battery Remaining Longevity: 66 mo
Battery Voltage: 2.99 V
Brady Statistic RV Percent Paced: 0 %
Date Time Interrogation Session: 20221027032204
HighPow Impedance: 64 Ohm
Implantable Lead Implant Date: 20160725
Implantable Lead Location: 753860
Implantable Lead Model: 293
Implantable Lead Serial Number: 383593
Implantable Pulse Generator Implant Date: 20160725
Lead Channel Impedance Value: 380 Ohm
Lead Channel Impedance Value: 380 Ohm
Lead Channel Pacing Threshold Amplitude: 0.875 V
Lead Channel Pacing Threshold Pulse Width: 0.4 ms
Lead Channel Sensing Intrinsic Amplitude: 13.25 mV
Lead Channel Sensing Intrinsic Amplitude: 13.25 mV
Lead Channel Setting Pacing Amplitude: 2.5 V
Lead Channel Setting Pacing Pulse Width: 0.4 ms
Lead Channel Setting Sensing Sensitivity: 0.3 mV

## 2021-06-07 LAB — LIPID PANEL
Cholesterol: 118 mg/dL (ref 0–200)
HDL: 32 mg/dL — ABNORMAL LOW (ref >40)
LDL Cholesterol: 71 mg/dL (ref 0–99)
Total CHOL/HDL Ratio: 3.7 RATIO
Triglycerides: 76 mg/dL (ref <150)
VLDL: 15 mg/dL (ref 0–40)

## 2021-06-07 LAB — BASIC METABOLIC PANEL
Anion gap: 6 (ref 5–15)
BUN: 21 mg/dL (ref 8–23)
CO2: 27 mmol/L (ref 22–32)
Calcium: 9.1 mg/dL (ref 8.9–10.3)
Chloride: 106 mmol/L (ref 98–111)
Creatinine, Ser: 1.29 mg/dL — ABNORMAL HIGH (ref 0.61–1.24)
GFR, Estimated: >60 mL/min (ref >60)
Glucose, Bld: 139 mg/dL — ABNORMAL HIGH (ref 70–99)
Potassium: 4.4 mmol/L (ref 3.5–5.1)
Sodium: 139 mmol/L (ref 135–145)

## 2021-06-07 MED ORDER — FUROSEMIDE 40 MG PO TABS: 60.0000 mg | ORAL_TABLET | Freq: Every day | ORAL | 6 refills | Status: DC

## 2021-06-07 MED ORDER — CHANTIX STARTING MONTH PAK 0.5 MG X 11 & 1 MG X 42 PO TBPK: ORAL_TABLET | ORAL | 0 refills | Status: DC

## 2021-06-07 MED ORDER — DAPAGLIFLOZIN PROPANEDIOL 10 MG PO TABS: 10.0000 mg | ORAL_TABLET | Freq: Every day | ORAL | 6 refills | Status: AC

## 2021-06-07 NOTE — Patient Instructions (Addendum)
Increase Furosemide to 60 mg Daily  Start Chantix as directed  Labs done today, we will call you for abnormal results  Your physician recommends that you return for lab work in: 1-2 weeks  Your physician has requested that you have an echocardiogram. Echocardiography is a painless test that uses sound waves to create images of your heart. It provides your doctor with information about the size and shape of your heart and how well your heart's chambers and valves are working. This procedure takes approximately one hour. There are no restrictions for this procedure. YOU WILL BE CALLED FOR THIS APPOINTMENT  Your physician has requested that you have an ankle brachial index (ABI). During this test an ultrasound and blood pressure cuff are used to evaluate the arteries that supply the arms and legs with blood. Allow thirty minutes for this exam. There are no restrictions or special instructions. YOU WILL BE CALLED FOR THIS APPOINTMENT  Your physician recommends that you schedule a follow-up appointment in: 3 months  Do the following things EVERYDAY: Weigh yourself in the morning before breakfast. Write it down and keep it in a log. Take your medicines as prescribed Eat low salt foods--Limit salt (sodium) to 2000 mg per day.  Stay as active as you can everyday Limit all fluids for the day to less than 2 liters  If you have any questions or concerns before your next appointment please send [place] a message through [place] or call our office at [phone].    TO LEAVE A MESSAGE FOR THE NURSE SELECT OPTION 2, PLEASE LEAVE A MESSAGE INCLUDING: YOUR NAME DATE OF BIRTH CALL BACK NUMBER REASON FOR CALL**this is important as we prioritize the call backs  YOU WILL RECEIVE A CALL BACK THE SAME DAY AS LONG AS YOU CALL BEFORE 4:00 PM  At the [facility], you and your health needs are our priority. As part of our continuing mission to provide you with exceptional heart care, we have  created designated Provider Care Teams. These Care Teams include your primary Cardiologist (physician) and Advanced Practice Providers (APPs- Physician Assistants and Nurse Practitioners) who all work together to provide you with the care you need, when you need it.   You may see any of the following providers on your designated Care Team at your next follow up: Dr Glori Bickers Dr Haynes Kerns, NP Lyda Jester, [place] [facility] [place], [place] Audry Riles, PharmD   Please be sure to bring in all your medications bottles to every appointment.

## 2021-06-07 NOTE — Progress Notes (Signed)
Electrophysiology Office Note   Date:  06/07/2021   ID:  David Bright, DOB May 20, 1961, MRN [id number]  PCP:  Stacie Glaze, DO  Cardiologist: Algernon Huxley Primary Electrophysiologist:  Henny Strauch Meredith Leeds, MD    Chief Complaint: CHF   History of Present Illness: David Bright is a 60 y.o. male who is being seen today for the evaluation of CHF at the request of Stacie Glaze, DO. Presenting today for electrophysiology evaluation.  He has a history significant for chronic systolic heart failure, COPD, active tobacco abuse, hypertension, hyperlipidemia, sleep apnea, type 2 diabetes.  He is status post Medtronic ICD implanted in 2016 by Dr. Caryl Comes.  Today, denies symptoms of palpitations, chest pain, shortness of breath, orthopnea, PND, lower extremity edema, claudication, dizziness, presyncope, syncope, bleeding, or neurologic sequela. The patient is tolerating medications without difficulties.  He has been feeling well.  He has had no chest pain or shortness of breath.  Is able do all of his daily activities without restriction.  He was seen in the heart failure clinic today who increased his diuretics.   Past Medical History:  Diagnosis Date   Chronic combined systolic and diastolic CHF (congestive heart failure) (HCC)    Systolic and diastolic   COPD (chronic obstructive pulmonary disease) (HCC)    Hyperlipidemia    Hypertension    Non compliance with medical treatment    Non-ischemic cardiomyopathy ([place])    a. Normal cath 2002;  b. 01/2015 Echo: EF 15-20%, mod conc LVH, restrictive physiology, mod MR, sev dil LA, triv TR/PI;  c. 02/2015 Cath: LM nl, LAD 30d, LCX nl, OM1 small, nl, RCA nl;  d. 02/2015 s/p MDT single lead AICD (ser# [id number]).   OSA (obstructive sleep apnea) 06/04/2015   Mild with AHI 12.3/hr and AHI 24.5/hr in REM sleep.   Polysubstance abuse ([place])    History of quitting alcohol, marijuana and crack over 12 years ago   Type II diabetes mellitus ([place])     Past Surgical History:  Procedure Laterality Date   CARDIAC CATHETERIZATION N/A 03/06/2015   Procedure: Right/Left Heart Cath and Coronary Angiography;  Surgeon: Belva Crome, MD;  Location: [place] [facility];  Service: Cardiovascular;  Laterality: N/A;   EP IMPLANTABLE DEVICE N/A 03/06/2015   Procedure: ICD Implant;  Surgeon: Deboraha Sprang, MD;  Location: [place] [facility];  Service: Cardiovascular;  Laterality: N/A;   none       Current Outpatient Medications  Medication Sig Dispense Refill   aspirin EC 81 MG tablet Take 1 tablet (81 mg total) by mouth daily. 30 tablet 0   atorvastatin (LIPITOR) 20 MG tablet Take 1 tablet (20 mg total) by mouth daily. 30 tablet 3   carvedilol (COREG) 6.25 MG tablet TAKE 1 TABLET BY MOUTH TWICE DAILY WITH MEALS 180 tablet 3   dapagliflozin propanediol (FARXIGA) 10 MG TABS tablet Take 1 tablet (10 mg total) by mouth daily before breakfast. 30 tablet 6   ENTRESTO 97-103 MG Take 1 tablet by mouth twice daily 180 tablet 2   furosemide (LASIX) 40 MG tablet Take 1.5 tablets (60 mg total) by mouth daily. 45 tablet 6   isosorbide-hydrALAZINE (BIDIL) 20-37.5 MG tablet Take 1 tablet by mouth 2 (two) times daily.     LANTUS SOLOSTAR 100 UNIT/ML Solostar Pen Inject 17 Units into the skin at bedtime.     spironolactone (ALDACTONE) 25 MG tablet Take 1 tablet by mouth once daily 90 tablet 3   Varenicline  Tartrate, Starter, (CHANTIX STARTING MONTH PAK) 0.5 MG X 11 & 1 MG X 42 TBPK Take 0.5 mg by mouth daily at 12 noon for 3 days, THEN 0.5 mg 2 (two) times daily for 4 days, THEN 1 mg 2 (two) times daily. 55 each 0   vitamin B-12 (CYANOCOBALAMIN) 500 MCG tablet Take 500 mcg by mouth daily.     Vitamin D, Ergocalciferol, (DRISDOL) 1.25 MG (50000 UNIT) CAPS capsule Take 50,000 Units by mouth once a week.     No current facility-administered medications for this visit.    Allergies:   Patient has no known allergies.   Social History:  The patient  reports that he has  been smoking cigarettes. He has a 33.00 pack-year smoking history. He has never used smokeless tobacco. He reports that he does not drink alcohol and does not use drugs.   Family History:  The patient's family history includes Cancer in his mother.   ROS:  Please see the history of present illness.   Otherwise, review of systems is positive for none.   All other systems are reviewed and negative.   PHYSICAL EXAM: VS:  BP 130/74   Pulse 84   Ht 6' (1.829 m)   Wt 289 lb 6.4 oz (131.3 kg)   SpO2 98%   BMI 39.25 kg/m  , BMI Body mass index is 39.25 kg/m. GEN: Well nourished, well developed, in no acute distress  HEENT: normal  Neck: no JVD, carotid bruits, or masses Cardiac: RRR; no murmurs, rubs, or gallops,no edema  Respiratory:  clear to auscultation bilaterally, normal work of breathing GI: soft, nontender, nondistended, + BS MS: no deformity or atrophy  Skin: warm and dry, device site well healed Neuro:  Strength and sensation are intact Psych: euthymic mood, full affect  EKG:  EKG is ordered today. Personal review of the ekg ordered shows sinus rhythm, rate 84  Personal review of the device interrogation today. Results in [place]: 06/07/2021: BUN 21; Creatinine, Ser 1.29; Hemoglobin 14.8; Platelets 220; Potassium 4.4; Sodium 139    Lipid Panel     Component Value Date/Time   CHOL 118 06/07/2021 1300   TRIG 76 06/07/2021 1300   HDL 32 (L) 06/07/2021 1300   CHOLHDL 3.7 06/07/2021 1300   VLDL 15 06/07/2021 1300   LDLCALC 71 06/07/2021 1300     Wt Readings from Last 3 Encounters:  06/07/21 289 lb 6.4 oz (131.3 kg)  06/07/21 290 lb 9.6 oz (131.8 kg)  06/06/20 299 lb (135.6 kg)      Other studies Reviewed: Additional studies/ records that were reviewed today include: TTE 10/19/2019 Review of the above records today demonstrates:   1. Left ventricular ejection fraction, by estimation, is 55 to 60%. The  left ventricle has normal function. The left  ventricle has no regional  wall motion abnormalities. There is mild concentric left ventricular  hypertrophy. Left ventricular diastolic  parameters are consistent with Grade I diastolic dysfunction (impaired  relaxation).   2. Right ventricular systolic function is normal. The right ventricular  size is normal. There is normal pulmonary artery systolic pressure.   3. The mitral valve is normal in structure. No evidence of mitral valve  regurgitation. No evidence of mitral stenosis.   4. The aortic valve is tricuspid. Aortic valve regurgitation is not  visualized. No aortic stenosis is present.   5. The inferior vena cava is normal in size with greater than 50%  respiratory variability, suggesting  right atrial pressure of 3 mmHg.    ASSESSMENT AND PLAN:  1.  Chronic systolic heart failure: Currently on Aldactone, Entresto, carvedilol.  Fortunately his ejection fraction has improved.  He is status post Medtronic ICD implanted in 2016.  Device functioning appropriately.  No changes.  2.  Obstructive sleep apnea: CPAP compliance encouraged.   Current medicines are reviewed at length with the patient today.   The patient does not have concerns regarding his medicines.  The following changes were made today:  none  Labs/ tests ordered today include:  Orders Placed This Encounter  Procedures   EKG 12-Lead      Disposition:   FU with Franny Selvage 1year  Signed, Marajade Lei Meredith Leeds, MD  06/07/2021 2:28 PM     [facility] [address] [place] [address] [phone] (office) [phone] (fax)

## 2021-06-07 NOTE — Progress Notes (Signed)
ReDS Vest / Clip - 06/07/21 1300       ReDS Vest / Clip   Station Marker D    Ruler Value 36    ReDS Value Range Moderate volume overload    ReDS Actual Value 37

## 2021-06-08 ENCOUNTER — Ambulatory Visit (INDEPENDENT_AMBULATORY_CARE_PROVIDER_SITE_OTHER): Payer: Medicare Other

## 2021-06-08 DIAGNOSIS — I5022 Chronic systolic (congestive) heart failure: Secondary | ICD-10-CM

## 2021-06-08 DIAGNOSIS — Z95810 Presence of automatic (implantable) cardiac defibrillator: Secondary | ICD-10-CM

## 2021-06-08 NOTE — Progress Notes (Signed)
EPIC Encounter for ICM Monitoring  Patient Name: David Bright is a 60 y.o. male Date: 06/08/2021 Primary Care Physican: Stacie Glaze, DO Primary Cardiologist: Nelson/McLean   Electrophysiologist:  Curt Bears  05/28/2021 Weight:  290-300 lbs (not weighing home)           Pt seen in office by Dr Aundra Dubin 10/27 and recommendation to increase Furosemide to 60 mg daily         Optivol thoracic impedance trending slightly below baseline normal.           Prescribed:              Spironolactone 25 mg take 1 tablet daily Furosemide 40 mg take 1.5 tablets (60 mg total) daily.   Labs: 06/07/2021 Creatinine 1.29, BUN 21, Potassium 4.4, Sodium 139, GFR >60 A complete set of results can be found in Results Review   Recommendations:  None   Follow-up plan: ICM clinic phone appointment on 06/13/2021 to recheck fluid levels   91 day device clinic remote transmission 09/06/2021.     EP/Cardiology Office Visits:  08/28/2021 with HF clinic NP/PA   Copy of ICM check sent to Dr. Curt Bears.     3 month ICM trend: 06/07/2021.    1 Year ICM trend:       [name], RN 06/08/2021 3:54 PM

## 2021-06-09 NOTE — Progress Notes (Signed)
ID:  David Bright, DOB Oct 16, 1960, MRN [id number]   Provider location: [facility] Type of Visit: Established patient   PCP:  Stacie Glaze, DO  HF Cardiology: Dr. Aundra Dubin   History of Present Illness: David Bright is a 60 y.o. male who has a history of chronic systolic CHF and COPD/active smoking.  He has a known nonischemic cardiomyopathy, echo in 1/17 with EF 20% and a severely dilated LV.  No coronary disease on 7/16 cardiac cath.  Repeat echo 2/18, showing EF up to 40%.   He had significant PAD by peripheral arterial dopplers, saw Dr. Gwenlyn Found with plan of medical management for now. He also has knee pain which is limiting.   Echo 09/2017: EF up to 50-55% with mild LVH. Echo in 1/20 showed EF 55-60%.  Echo in 3/21 showed EF 55-60% with normal RV.   He returns for followup of CHF.  No dyspnea walking flat ground.  He gets short of breath walking up stairs.  He gets calf tightness also walking up stairs.  No chest pain.  No orthopnea/PND.  He is still smoking, failed Wellbutrin.      Medtronic device interrogation: fluid index > threshold  REDS clip 37%  Labs (12/17): K 4.3, creatinine 1.09 Labs (1/18): K 3.9, creatinine 1.13 Labs (3/18): K 4.3, creatinine 1.48, NT-proBNP 61 Labs (4/18): K 4.5, creatinine 1.23, LDL 71, BNP 34 Labs (5/18): K 4.5, creatinine 1.2 Labs (1/19): K 4.2, creatinine 1.19, LDL 69, HDL 39 Labs (11/19): LDL 66, HDL 34, K 3.3, creatinine 1.19, BNP 45 Labs (12/19): K 4.4, creatinine 1.59 Labs (8/20): K 4.5, creatinine 1.66 Labs (2/21): K 4.5, creatinine 1.63 Labs (3/21): LDL 64, HDL 38 Labs (6/21): K 4.2, creatinine 1.24 Labs (10/21): K 4.1, creatinine 1.43  ECG (personally reviewed): NSR, poor RWP  PMH:  1. Chronic systolic CHF: Nonischemic cardiomyopathy.  Has Medtronic ICD.   - LHC (7/16) with nonobstructive disease.  - Echo (1/17) with EF 20%, severe LV dilation, mild to moderate MR.  - Echo (2/18): EF 40%, mild LV  dilation.  - Echo (5/19): EF 50-55%, mild LVH - Echo (1/20): EF 55-60%, mild LVH, normal RV size and systolic function.  - Echo (3/20): EF 50-55%, RV normal, IVC normal - Echo (3/21): EF 55-60%, mild LVH 2. COPD: Active smoker.  - PFTs (2/18) with minimal obstruction, mild restriction suggesting a parenchymal process, low DLCO.  - CT chest (6/18): No ILD, +emphysema, lung nodules.  - CT chest (11/19): Emphysema, no ILD, stable nodules 3. Type II diabetes with peripheral neuropathy.  4. OSA on CPAP 5. HTN 6. Hyperlipidemia 7. PAD: ABIs abnormal at outside office.  - Peripheral arterial dopplers (3/18) with > 50% left CIA and bilateral EIA stenosis, 50-74% mid LSFA stenosis.  - ABIs (2/19): 0.85 right, 0.86 left (mild).  - ABIs (5/20): 0.89 right, 0.86 left (mild).  - ABIs (5/21): 0.9 right, 0.84 left (mild) 8. CKD: Stage 3.  9. B12 deficiency.  10. COVID-19 7/22  SH: Lives in [place], works at [employer] and [employer], smokes about 4-5 cigs/day now.  Rare ETOH.  Remote substance abuse.   FH: Aunt with CHF, brother with ESRD.    Review of systems complete and found to be negative unless listed in HPI.   Current Outpatient Medications  Medication Sig Dispense Refill   aspirin EC 81 MG tablet Take 1 tablet (81 mg total) by mouth daily. 30 tablet 0   atorvastatin (LIPITOR) 20  MG tablet Take 1 tablet (20 mg total) by mouth daily. 30 tablet 3   carvedilol (COREG) 6.25 MG tablet TAKE 1 TABLET BY MOUTH TWICE DAILY WITH MEALS 180 tablet 3   dapagliflozin propanediol (FARXIGA) 10 MG TABS tablet Take 1 tablet (10 mg total) by mouth daily before breakfast. 30 tablet 6   ENTRESTO 97-103 MG Take 1 tablet by mouth twice daily 180 tablet 2   isosorbide-hydrALAZINE (BIDIL) 20-37.5 MG tablet Take 1 tablet by mouth 2 (two) times daily.     LANTUS SOLOSTAR 100 UNIT/ML Solostar Pen Inject 17 Units into the skin at bedtime.     spironolactone (ALDACTONE) 25 MG tablet Take 1 tablet by mouth once daily 90  tablet 3   Varenicline Tartrate, Starter, (CHANTIX STARTING MONTH PAK) 0.5 MG X 11 & 1 MG X 42 TBPK Take 0.5 mg by mouth daily at 12 noon for 3 days, THEN 0.5 mg 2 (two) times daily for 4 days, THEN 1 mg 2 (two) times daily. 55 each 0   Vitamin D, Ergocalciferol, (DRISDOL) 1.25 MG (50000 UNIT) CAPS capsule Take 50,000 Units by mouth once a week.     furosemide (LASIX) 40 MG tablet Take 1.5 tablets (60 mg total) by mouth daily. 45 tablet 6   vitamin B-12 (CYANOCOBALAMIN) 500 MCG tablet Take 500 mcg by mouth daily.     No current facility-administered medications for this encounter.   Exam:   BP 100/60   Pulse 86   Wt 131.8 kg (290 lb 9.6 oz)   SpO2 98%   BMI 39.41 kg/m  General: NAD Neck: Thick neck, JVP difficult, no thyromegaly or thyroid nodule.  Lungs: Clear to auscultation bilaterally with normal respiratory effort. CV: Nondisplaced PMI.  Heart regular S1/S2, no S3/S4, no murmur.  No peripheral edema.  No carotid bruit.  Unable to palpate pedal pulses.  Abdomen: Soft, nontender, no hepatosplenomegaly, no distention.  Skin: Intact without lesions or rashes.  Neurologic: Alert and oriented x 3.  Psych: Normal affect. Extremities: No clubbing or cyanosis.  HEENT: Normal.   Assessment/Plan: 1. Chronic systolic CHF: Nonischemic cardiomyopathy => ?due to viral myocarditis or prior substance abuse.  Medtronic ICD.  Echo 1/17 with severely dilated LV and EF 20%, EF up to 40% on echo 2/18.  Echo in 1/20 showed EF up to 55% with normal RV, and echo in 3/21 showed EF 55-60%.  NYHA class II symptoms.  Exam is difficult for volume but he is mildly volume overloaded by REDS clip and fluid index > threshold by Optivol.    - Increase Lasix to 60 mg daily, BMET today and in 10 days.   - Continue spironolactone 25 mg daily, Entresto 97/103 bid, Bidil 1 tab tid, Coreg 6.25 mg bid.  - I will arrange for repeat echo.  2. Smoking/suspect COPD:  PFTs done, somewhat concerning for interstitial lung  disease rather than emphysema with restrictive PFTs and decreased DLCO => no ILD on CT, emphysema noted along with lung nodules. Most recent repeat CT in 11/19 showed stable nodules, do not need repeat. He continues to smoke about 2/3 pack/day.  Failed Wellbutrin.  - He will try Chantix.  3. OSA: Continue CPAP. No change.  4. PAD: Significant disease on peripheral dopplers, stable mild claudication. He saw Dr. Gwenlyn Found, medical management recommended.  I encouraged him to try to walk through the pain. ABIs in 5/21 mildly reduced.  - Continue ASA 81 and statin. Check lipids.    - I will arrange  for ABIs.   Followup with APP in 3 months.   Signed, Loralie Champagne, MD  06/09/2021  [facility] [address] [facility] [place] [address] [phone] (office) [phone] (fax)

## 2021-06-13 ENCOUNTER — Ambulatory Visit (INDEPENDENT_AMBULATORY_CARE_PROVIDER_SITE_OTHER): Payer: Medicare Other

## 2021-06-13 DIAGNOSIS — I5022 Chronic systolic (congestive) heart failure: Secondary | ICD-10-CM

## 2021-06-13 DIAGNOSIS — Z95810 Presence of automatic (implantable) cardiac defibrillator: Secondary | ICD-10-CM

## 2021-06-13 NOTE — Progress Notes (Signed)
EPIC Encounter for ICM Monitoring  Patient Name: David Bright is a 60 y.o. male Date: 06/13/2021 Primary Care Physican: Stacie Glaze, DO Primary Cardiologist: Nelson/McLean   Electrophysiologist:  Curt Bears  05/28/2021 Weight:  290-300 lbs (not weighing home)           Transmission reviewed.         Optivol thoracic impedance trending close to baseline.           Prescribed:             Furosemide 40 mg take 1.5 tablets (60 mg total) daily. (Increased 10/27) Spironolactone 25 mg take 1 tablet daily   Labs: 06/07/2021 Creatinine 1.29, BUN 21, Potassium 4.4, Sodium 139, GFR >60 A complete set of results can be found in Results Review   Recommendations:  None   Follow-up plan: ICM clinic phone appointment on 06/28/2021.   91 day device clinic remote transmission 09/06/2021.     EP/Cardiology Office Visits:  06/28/2021 with HF clinic NP/PA   Copy of ICM check sent to Dr. Curt Bears.      3 month ICM trend: 06/13/2021.    1 Year ICM trend:       [name], RN 06/13/2021 10:51 AM

## 2021-06-14 NOTE — Progress Notes (Signed)
Remote ICD transmission.   

## 2021-06-18 ENCOUNTER — Other Ambulatory Visit: Payer: Self-pay

## 2021-06-18 ENCOUNTER — Ambulatory Visit (HOSPITAL_COMMUNITY)
Admission: RE | Admit: 2021-06-18 | Discharge: 2021-06-18 | Disposition: A | Discharge status: Home / Self Care | Payer: Medicare Other | Source: Ambulatory Visit | Attending: Internal Medicine | Admitting: Internal Medicine

## 2021-06-18 DIAGNOSIS — I5042 Chronic combined systolic (congestive) and diastolic (congestive) heart failure: Secondary | ICD-10-CM | POA: Insufficient documentation

## 2021-06-18 LAB — BASIC METABOLIC PANEL
Anion gap: 8 (ref 5–15)
BUN: 16 mg/dL (ref 8–23)
CO2: 26 mmol/L (ref 22–32)
Calcium: 9.2 mg/dL (ref 8.9–10.3)
Chloride: 105 mmol/L (ref 98–111)
Creatinine, Ser: 1.08 mg/dL (ref 0.61–1.24)
GFR, Estimated: >60 mL/min (ref >60)
Glucose, Bld: 149 mg/dL — ABNORMAL HIGH (ref 70–99)
Potassium: 3.3 mmol/L — ABNORMAL LOW (ref 3.5–5.1)
Sodium: 139 mmol/L (ref 135–145)

## 2021-06-27 ENCOUNTER — Other Ambulatory Visit: Payer: Self-pay

## 2021-06-27 ENCOUNTER — Ambulatory Visit (HOSPITAL_BASED_OUTPATIENT_CLINIC_OR_DEPARTMENT_OTHER)
Admission: RE | Admit: 2021-06-27 | Discharge: 2021-06-27 | Disposition: A | Discharge status: Home / Self Care | Payer: Medicare Other | Source: Ambulatory Visit | Attending: Cardiology | Admitting: Cardiology

## 2021-06-27 DIAGNOSIS — I739 Peripheral vascular disease, unspecified: Secondary | ICD-10-CM | POA: Insufficient documentation

## 2021-06-27 DIAGNOSIS — I5042 Chronic combined systolic (congestive) and diastolic (congestive) heart failure: Secondary | ICD-10-CM | POA: Insufficient documentation

## 2021-06-27 LAB — ECHOCARDIOGRAM COMPLETE
AR max vel: 3.37 cm2
AV Area VTI: 3.17 cm2
AV Area mean vel: 3.16 cm2
AV Mean grad: 3 mmHg
AV Peak grad: 5.2 mmHg
Ao pk vel: 1.14 m/s
Area-P 1/2: 3.91 cm2
Calc EF: 48.8 %
S' Lateral: 2.7 cm
Single Plane A2C EF: 57.9 %
Single Plane A4C EF: 42.4 %

## 2021-06-28 ENCOUNTER — Ambulatory Visit (INDEPENDENT_AMBULATORY_CARE_PROVIDER_SITE_OTHER): Payer: Medicare Other

## 2021-06-28 DIAGNOSIS — I5042 Chronic combined systolic (congestive) and diastolic (congestive) heart failure: Secondary | ICD-10-CM

## 2021-06-28 DIAGNOSIS — Z95810 Presence of automatic (implantable) cardiac defibrillator: Secondary | ICD-10-CM

## 2021-06-29 NOTE — Progress Notes (Signed)
EPIC Encounter for ICM Monitoring  Patient Name: David Bright is a 60 y.o. male Date: 06/29/2021 Primary Care Physican: Stacie Glaze, DO Primary Cardiologist: Nelson/McLean   Electrophysiologist:  Curt Bears  05/28/2021 Weight:  290-300 lbs (not weighing home)           Spoke with patient and heart failure questions reviewed.  Pt asymptomatic for fluid accumulation and feeling well.         Optivol thoracic impedance suggesting possible fluid accumulation starting 10/15 and returned to normal 06/28/2021.           Prescribed:             Furosemide 40 mg take 1.5 tablets (60 mg total) daily. (Increased 10/27) Spironolactone 25 mg take 1 tablet daily   Labs: 06/07/2021 Creatinine 1.29, BUN 21, Potassium 4.4, Sodium 139, GFR >60 A complete set of results can be found in Results Review   Recommendations:  Recommendation to limit salt intake to 2000 mg daily and fluid intake to 64 oz daily.  Encouraged to call if experiencing any fluid symptoms.    Follow-up plan: ICM clinic phone appointment on 07/30/2021.   91 day device clinic remote transmission 09/06/2021.     EP/Cardiology Office Visits:  08/28/2021 with HF clinic NP/PA   Copy of ICM check sent to Dr. Curt Bears.   3 month ICM trend: 06/28/2021.    12-14 Month ICM trend:       [name], RN 06/29/2021 3:14 PM

## 2021-07-04 ENCOUNTER — Other Ambulatory Visit (HOSPITAL_COMMUNITY): Payer: Self-pay

## 2021-07-04 MED ORDER — CHANTIX STARTING MONTH PAK 0.5 MG X 11 & 1 MG X 42 PO TBPK: 1.0000 mg | ORAL_TABLET | Freq: Two times a day (BID) | ORAL | 2 refills | Status: DC

## 2021-07-13 ENCOUNTER — Other Ambulatory Visit (HOSPITAL_COMMUNITY): Payer: Self-pay

## 2021-07-13 MED ORDER — CHANTIX STARTING MONTH PAK 0.5 MG X 11 & 1 MG X 42 PO TBPK: 1.0000 mg | ORAL_TABLET | Freq: Two times a day (BID) | ORAL | 0 refills | Status: DC

## 2021-07-30 ENCOUNTER — Ambulatory Visit (INDEPENDENT_AMBULATORY_CARE_PROVIDER_SITE_OTHER): Payer: Medicare Other

## 2021-07-30 DIAGNOSIS — Z95810 Presence of automatic (implantable) cardiac defibrillator: Secondary | ICD-10-CM

## 2021-07-30 DIAGNOSIS — I5042 Chronic combined systolic (congestive) and diastolic (congestive) heart failure: Secondary | ICD-10-CM | POA: Diagnosis not present

## 2021-08-01 ENCOUNTER — Telehealth: Payer: Self-pay

## 2021-08-01 NOTE — Progress Notes (Signed)
EPIC Encounter for ICM Monitoring  Patient Name: David Bright is a 60 y.o. male Date: 08/01/2021 Primary Care Physican: Stacie Glaze, DO Primary Cardiologist: Nelson/McLean   Electrophysiologist:  Curt Bears  05/28/2021 Weight:  290-300 lbs (not weighing home)           Attempted call to patient and unable to reach.  Left detailed message per DPR regarding transmission. Transmission reviewed.          Optivol thoracic impedance suggesting normal fluid levels.           Prescribed:             Furosemide 40 mg take 1.5 tablets (60 mg total) daily. (Increased 10/27) Spironolactone 25 mg take 1 tablet daily   Labs: 06/07/2021 Creatinine 1.29, BUN 21, Potassium 4.4, Sodium 139, GFR >60 A complete set of results can be found in Results Review   Recommendations:  Left voice mail with ICM number and encouraged to call if experiencing any fluid symptoms.    Follow-up plan: ICM clinic phone appointment on 09/03/2021.   91 day device clinic remote transmission 09/06/2021.     EP/Cardiology Office Visits:  08/28/2021 with HF clinic NP/PA   Copy of ICM check sent to Dr. Curt Bears.   3 month ICM trend: 07/30/2021.    12-14 Month ICM trend:       [name], RN 08/01/2021 1:34 PM

## 2021-08-01 NOTE — Telephone Encounter (Signed)
Remote ICM transmission received.  Attempted call to patient regarding ICM remote transmission and left detailed message per DPR.  Advised to return call for any fluid symptoms or questions. Next ICM remote transmission scheduled 09/03/2021.   ° °

## 2021-08-10 ENCOUNTER — Telehealth: Payer: Self-pay

## 2021-08-10 NOTE — Telephone Encounter (Signed)
Patient called in stating his mom 61 years old has tested positive for Covid and he has to take care of her. Patient is asking if it's anything he can take or can be called in for him because he is sure he will end up with Covid. His preferred pharmacy is [facility] on [address] in [place]

## 2021-08-28 ENCOUNTER — Other Ambulatory Visit: Payer: Self-pay

## 2021-08-28 ENCOUNTER — Encounter (HOSPITAL_COMMUNITY): Payer: Self-pay

## 2021-08-28 ENCOUNTER — Ambulatory Visit (HOSPITAL_COMMUNITY)
Admission: RE | Admit: 2021-08-28 | Discharge: 2021-08-28 | Disposition: A | Discharge status: Home / Self Care | Payer: 59 | Source: Ambulatory Visit | Attending: Family Medicine | Admitting: Family Medicine

## 2021-08-28 VITALS — BP 90/50 | HR 86 | Ht 72.0 in | Wt 289.2 lb

## 2021-08-28 DIAGNOSIS — Z841 Family history of disorders of kidney and ureter: Secondary | ICD-10-CM | POA: Insufficient documentation

## 2021-08-28 DIAGNOSIS — I959 Hypotension, unspecified: Secondary | ICD-10-CM | POA: Diagnosis not present

## 2021-08-28 DIAGNOSIS — I130 Hypertensive heart and chronic kidney disease with heart failure and stage 1 through stage 4 chronic kidney disease, or unspecified chronic kidney disease: Secondary | ICD-10-CM | POA: Insufficient documentation

## 2021-08-28 DIAGNOSIS — I5042 Chronic combined systolic (congestive) and diastolic (congestive) heart failure: Secondary | ICD-10-CM

## 2021-08-28 DIAGNOSIS — M25569 Pain in unspecified knee: Secondary | ICD-10-CM | POA: Insufficient documentation

## 2021-08-28 DIAGNOSIS — Z9989 Dependence on other enabling machines and devices: Secondary | ICD-10-CM

## 2021-08-28 DIAGNOSIS — G4733 Obstructive sleep apnea (adult) (pediatric): Secondary | ICD-10-CM | POA: Diagnosis not present

## 2021-08-28 DIAGNOSIS — E1151 Type 2 diabetes mellitus with diabetic peripheral angiopathy without gangrene: Secondary | ICD-10-CM | POA: Insufficient documentation

## 2021-08-28 DIAGNOSIS — E1142 Type 2 diabetes mellitus with diabetic polyneuropathy: Secondary | ICD-10-CM | POA: Insufficient documentation

## 2021-08-28 DIAGNOSIS — J439 Emphysema, unspecified: Secondary | ICD-10-CM | POA: Insufficient documentation

## 2021-08-28 DIAGNOSIS — Z720 Tobacco use: Secondary | ICD-10-CM | POA: Diagnosis not present

## 2021-08-28 DIAGNOSIS — M79669 Pain in unspecified lower leg: Secondary | ICD-10-CM | POA: Diagnosis not present

## 2021-08-28 DIAGNOSIS — I428 Other cardiomyopathies: Secondary | ICD-10-CM | POA: Insufficient documentation

## 2021-08-28 DIAGNOSIS — I739 Peripheral vascular disease, unspecified: Secondary | ICD-10-CM

## 2021-08-28 DIAGNOSIS — Z79899 Other long term (current) drug therapy: Secondary | ICD-10-CM | POA: Diagnosis not present

## 2021-08-28 DIAGNOSIS — F17210 Nicotine dependence, cigarettes, uncomplicated: Secondary | ICD-10-CM | POA: Diagnosis not present

## 2021-08-28 DIAGNOSIS — I5022 Chronic systolic (congestive) heart failure: Secondary | ICD-10-CM | POA: Insufficient documentation

## 2021-08-28 DIAGNOSIS — Z7984 Long term (current) use of oral hypoglycemic drugs: Secondary | ICD-10-CM | POA: Insufficient documentation

## 2021-08-28 DIAGNOSIS — Z95810 Presence of automatic (implantable) cardiac defibrillator: Secondary | ICD-10-CM | POA: Diagnosis not present

## 2021-08-28 DIAGNOSIS — I447 Left bundle-branch block, unspecified: Secondary | ICD-10-CM | POA: Insufficient documentation

## 2021-08-28 DIAGNOSIS — N183 Chronic kidney disease, stage 3 unspecified: Secondary | ICD-10-CM | POA: Insufficient documentation

## 2021-08-28 DIAGNOSIS — Z7982 Long term (current) use of aspirin: Secondary | ICD-10-CM | POA: Diagnosis not present

## 2021-08-28 DIAGNOSIS — Z8249 Family history of ischemic heart disease and other diseases of the circulatory system: Secondary | ICD-10-CM | POA: Diagnosis not present

## 2021-08-28 DIAGNOSIS — E1122 Type 2 diabetes mellitus with diabetic chronic kidney disease: Secondary | ICD-10-CM | POA: Insufficient documentation

## 2021-08-28 LAB — BASIC METABOLIC PANEL
Anion gap: 9 (ref 5–15)
BUN: 24 mg/dL — ABNORMAL HIGH (ref 8–23)
CO2: 24 mmol/L (ref 22–32)
Calcium: 8.8 mg/dL — ABNORMAL LOW (ref 8.9–10.3)
Chloride: 108 mmol/L (ref 98–111)
Creatinine, Ser: 1.28 mg/dL — ABNORMAL HIGH (ref 0.61–1.24)
GFR, Estimated: >60 mL/min (ref >60)
Glucose, Bld: 149 mg/dL — ABNORMAL HIGH (ref 70–99)
Potassium: 3.9 mmol/L (ref 3.5–5.1)
Sodium: 141 mmol/L (ref 135–145)

## 2021-08-28 MED ORDER — ISOSORB DINITRATE-HYDRALAZINE 20-37.5 MG PO TABS: 0.5000 | ORAL_TABLET | Freq: Three times a day (TID) | ORAL | 4 refills | Status: DC

## 2021-08-28 NOTE — Progress Notes (Signed)
ID:  David Bright, DOB [birth year], MRN [id number]   Provider location: [facility] Type of Visit: Established patient   PCP:  Stacie Glaze, DO  HF Cardiology: Dr. Aundra Dubin   History of Present Illness: David Bright is a 60 y.o. male who has a history of chronic systolic CHF and COPD/active smoking.  He has a known nonischemic cardiomyopathy, echo in 1/17 with EF 20% and a severely dilated LV.  No coronary disease on 7/16 cardiac cath.  Repeat echo 2/18, showing EF up to 40%.   He had significant PAD by peripheral arterial dopplers, saw Dr. Gwenlyn Found with plan of medical management for now. He also has knee pain which is limiting.   Echo 09/2017: EF up to 50-55% with mild LVH. Echo in 1/20 showed EF 55-60%.  Echo in 3/21 showed EF 55-60% with normal RV.   Echo 11/22 showed EF 60-65%, moderate LVH, grade I DD, normal RV  Today he returns for HF follow up. Overall feeling fine. He does not have significant dyspnea walking on flat ground but is SOB with stairs. Denies palpitations, abnormal bleeding, CP, dizziness, edema, or PND/Orthopnea. Appetite ok. No fever or chills. He does not weigh at home but he has a scale. Taking all medications. Smoking 1 ppd. No change to calf pain but is able to walk through it. Wears CPAP nightly.   Medtronic device interrogation: fluid index well below threshold, thoracic impedence stable, daily activity 1 hour, several short (lasting < 10 minute) episodes of ? AF, no VT (personally reviewed).  Labs (12/17): K 4.3, creatinine 1.09 Labs (1/18): K 3.9, creatinine 1.13 Labs (3/18): K 4.3, creatinine 1.48, NT-proBNP 61 Labs (4/18): K 4.5, creatinine 1.23, LDL 71, BNP 34 Labs (5/18): K 4.5, creatinine 1.2 Labs (1/19): K 4.2, creatinine 1.19, LDL 69, HDL 39 Labs (11/19): LDL 66, HDL 34, K 3.3, creatinine 1.19, BNP 45 Labs (12/19): K 4.4, creatinine 1.59 Labs (8/20): K 4.5, creatinine 1.66 Labs (2/21): K 4.5, creatinine 1.63 Labs (3/21):  LDL 64, HDL 38 Labs (6/21): K 4.2, creatinine 1.24 Labs (10/21): K 4.1, creatinine 1.43 Labs (11/22): K 3.3, creatinine 1.08  ECG (personally reviewed): NSR, 83 bpm  PMH:  1. Chronic systolic CHF: Nonischemic cardiomyopathy.  Has Medtronic ICD.   - LHC (7/16) with nonobstructive disease.  - Echo (1/17) with EF 20%, severe LV dilation, mild to moderate MR.  - Echo (2/18): EF 40%, mild LV dilation.  - Echo (5/19): EF 50-55%, mild LVH - Echo (1/20): EF 55-60%, mild LVH, normal RV size and systolic function.  - Echo (3/20): EF 50-55%, RV normal, IVC normal - Echo (3/21): EF 55-60%, mild LVH - Echo (11/22): EF 60-65%, moderate LVH, grade I DD, normal RV 2. COPD: Active smoker.  - PFTs (2/18) with minimal obstruction, mild restriction suggesting a parenchymal process, low DLCO.  - CT chest (6/18): No ILD, +emphysema, lung nodules.  - CT chest (11/19): Emphysema, no ILD, stable nodules 3. Type II diabetes with peripheral neuropathy.  4. OSA on CPAP 5. HTN 6. Hyperlipidemia 7. PAD: ABIs abnormal at outside office.  - Peripheral arterial dopplers (3/18) with > 50% left CIA and bilateral EIA stenosis, 50-74% mid LSFA stenosis.  - ABIs (2/19): 0.85 right, 0.86 left (mild).  - ABIs (5/20): 0.89 right, 0.86 left (mild).  - ABIs (5/21): 0.9 right, 0.84 left (mild) - ABIs (11/22): 0.9 (right), 0.84 (left) 8. CKD: Stage 3.  9. B12 deficiency.  10. COVID-19 7/22  SH: Lives in [place], works at [employer], smokes about 4-5 cigs/day now.  Rare ETOH.  Remote substance abuse.   FH: Aunt with CHF, brother with ESRD.    Review of systems complete and found to be negative unless listed in HPI.   Current Outpatient Medications  Medication Sig Dispense Refill   aspirin EC 81 MG tablet Take 1 tablet (81 mg total) by mouth daily. 30 tablet 0   atorvastatin (LIPITOR) 20 MG tablet Take 1 tablet (20 mg total) by mouth daily. 30 tablet 3   carvedilol (COREG) 6.25 MG tablet TAKE 1 TABLET BY MOUTH  TWICE DAILY WITH MEALS 180 tablet 3   dapagliflozin propanediol (FARXIGA) 10 MG TABS tablet Take 1 tablet (10 mg total) by mouth daily before breakfast. 30 tablet 6   ENTRESTO 97-103 MG Take 1 tablet by mouth twice daily 180 tablet 2   furosemide (LASIX) 40 MG tablet Take 40 mg by mouth.     isosorbide-hydrALAZINE (BIDIL) 20-37.5 MG tablet Take 1 tablet by mouth 2 (two) times daily.     LANTUS SOLOSTAR 100 UNIT/ML Solostar Pen Inject 17 Units into the skin at bedtime.     spironolactone (ALDACTONE) 25 MG tablet Take 1 tablet by mouth once daily 90 tablet 3   Varenicline Tartrate, Starter, (CHANTIX STARTING MONTH PAK) 0.5 MG X 11 & 1 MG X 42 TBPK Take 1 mg by mouth 2 (two) times daily. Additional refills should come from your PCP 60 each 0   vitamin B-12 (CYANOCOBALAMIN) 500 MCG tablet Take 500 mcg by mouth daily.     Vitamin D, Ergocalciferol, (DRISDOL) 1.25 MG (50000 UNIT) CAPS capsule Take 50,000 Units by mouth once a week.     No current facility-administered medications for this encounter.   Wt Readings from Last 3 Encounters:  08/28/21 131.2 kg (289 lb 3.2 oz)  06/07/21 131.8 kg (290 lb 9.6 oz)  06/07/21 131.3 kg (289 lb 6.4 oz)   Physical Exam:   BP (!) 90/50    Pulse 86    Ht 6' (1.829 m)    Wt 131.2 kg (289 lb 3.2 oz)    SpO2 97%    BMI 39.22 kg/m  General:  NAD. No resp difficulty HEENT: Normal Neck: Supple. Thick neck. Carotids 2+ bilat; no bruits. No lymphadenopathy or thryomegaly appreciated. Cor: PMI nondisplaced. Regular rate & rhythm. No rubs, gallops or murmurs. Lungs: Clear Abdomen: Obese, nontender, nondistended. No hepatosplenomegaly. No bruits or masses. Good bowel sounds. Extremities: No cyanosis, clubbing, rash, edema Neuro: Alert & oriented x 3, cranial nerves grossly intact. Moves all 4 extremities w/o difficulty. Affect pleasant.  Assessment/Plan: 1. Chronic systolic CHF: Nonischemic cardiomyopathy => ?due to viral myocarditis or prior substance abuse.   Medtronic ICD.  Echo 1/17 with severely dilated LV and EF 20%, EF up to 40% on echo 2/18.  Echo in 1/20 showed EF up to 55% with normal RV, and echo in 3/21 showed EF 55-60%.  Echo 11/22 showed EF 60-65%. NYHA class II symptoms.  He is not volume overloaded on exam or by OptiVol. - With low BP, decrease Bidil to 1/2 tab tid.   - Continue Lasix 40 mg daily, BMET today. - Continue Farxiga 10 mg daily. - Continue spironolactone 25 mg daily.  - Continue Entresto 97/103 mg bid.  - Continue Coreg 6.25 mg bid. 2. Smoking/suspect COPD:  PFTs done, somewhat concerning for interstitial lung disease rather than emphysema with restrictive PFTs and decreased DLCO => no  ILD on CT, emphysema noted along with lung nodules. Most recent repeat CT in 11/19 showed stable nodules, do not need repeat. He continues to smoke about 1 ppd.  Failed Wellbutrin and Chantix.  3. OSA: Continue CPAP. No change.  4. PAD: Significant disease on peripheral dopplers, stable mild claudication. He saw Dr. Gwenlyn Found, medical management recommended.  I encouraged him to try to walk through the pain. ABIs in 5/21 mildly reduced. ABIs 11/22 showed mild disease, similar to past.  - Continue ASA 81 and statin. Good lipids 10/22.   5. Hypotension: Decrease Bidil as above. Check BP at home. If sBP < 95 consistently, would stop Bidil.  Followup with Dr. Aundra Dubin in 4 months.   Signed, Rafael Bihari, FNP  08/28/2021  [facility] [address] [facility] [place] [address] [phone] (office) [phone] (fax)

## 2021-08-28 NOTE — Patient Instructions (Addendum)
DECREASE BIDIL to 1/2 tab three times a day  CHECK Blood Pressure at home.  Notify clinic if Blood Pressure low (top number below 95)  Labs today We will only contact you if something comes back abnormal or we need to make some changes. Otherwise no news is good news!  Your physician recommends that you schedule a follow-up appointment in: 4 months with Dr Aundra Dubin  Please call office at [phone] option 2 if you have any questions or concerns.   At the [facility], you and your health needs are our priority. As part of our continuing mission to provide you with exceptional heart care, we have created designated Provider Care Teams. These Care Teams include your primary Cardiologist (physician) and Advanced Practice Providers (APPs- Physician Assistants and Nurse Practitioners) who all work together to provide you with the care you need, when you need it.   You may see any of the following providers on your designated Care Team at your next follow up: Dr Glori Bickers Dr Haynes Kerns, NP Lyda Jester, [place] [facility] [place], [place] Audry Riles, PharmD   Please be sure to bring in all your medications bottles to every appointment.

## 2021-09-03 ENCOUNTER — Ambulatory Visit (INDEPENDENT_AMBULATORY_CARE_PROVIDER_SITE_OTHER): Payer: 59

## 2021-09-03 DIAGNOSIS — Z95810 Presence of automatic (implantable) cardiac defibrillator: Secondary | ICD-10-CM | POA: Diagnosis not present

## 2021-09-03 DIAGNOSIS — I5042 Chronic combined systolic (congestive) and diastolic (congestive) heart failure: Secondary | ICD-10-CM

## 2021-09-05 NOTE — Progress Notes (Signed)
EPIC Encounter for ICM Monitoring  Patient Name: David Bright is a 61 y.o. male Date: 09/05/2021 Primary Care Physican: Stacie Glaze, DO Primary Cardiologist: Nelson/McLean   Electrophysiologist:  Curt Bears  09/05/2021 Weight:  289 lbs           Spoke with patient and heart failure questions reviewed.  Pt asymptomatic for fluid accumulation.  Reports feeling well at this time and voices no complaints.          Optivol thoracic impedance suggesting normal fluid levels.           Prescribed:             Furosemide 40 mg take 1.5 tablets (60 mg total) daily. (Increased 10/27) Spironolactone 25 mg take 1 tablet daily   Labs: 08/28/2021 Creatinine 1.28, BUN 24, Potassium 3.9, Sodium 141, GFR >60 06/18/2021 Creatinine 1.08, BUN 16, Potassium 3.3, Sodium 139, GFR >60 06/07/2021 Creatinine 1.29, BUN 21, Potassium 4.4, Sodium 139, GFR >60 A complete set of results can be found in Results Review   Recommendations:  No changes and encouraged to call if experiencing any fluid symptoms.   Follow-up plan: ICM clinic phone appointment on 10/08/2021.   91 day device clinic remote transmission 12/06/2021.     EP/Cardiology Office Visits: 12/27/2021 with Dr Aundra Dubin.   Copy of ICM check sent to Dr. Curt Bears.   3 month ICM trend: 09/03/2021.    12-14 Month ICM trend:     [name], RN 09/05/2021 2:40 PM

## 2021-09-06 ENCOUNTER — Ambulatory Visit (INDEPENDENT_AMBULATORY_CARE_PROVIDER_SITE_OTHER): Payer: 59

## 2021-09-06 DIAGNOSIS — I428 Other cardiomyopathies: Secondary | ICD-10-CM

## 2021-09-06 LAB — CUP PACEART REMOTE DEVICE CHECK
Battery Remaining Longevity: 67 mo
Battery Voltage: 2.98 V
Brady Statistic RV Percent Paced: 0 %
Date Time Interrogation Session: 20230126052605
HighPow Impedance: 70 Ohm
Implantable Lead Implant Date: 20160725
Implantable Lead Location: 753860
Implantable Lead Model: 293
Implantable Lead Serial Number: 383593
Implantable Pulse Generator Implant Date: 20160725
Lead Channel Impedance Value: 399 Ohm
Lead Channel Impedance Value: 399 Ohm
Lead Channel Pacing Threshold Amplitude: 0.75 V
Lead Channel Pacing Threshold Pulse Width: 0.4 ms
Lead Channel Sensing Intrinsic Amplitude: 15.125 mV
Lead Channel Sensing Intrinsic Amplitude: 15.125 mV
Lead Channel Setting Pacing Amplitude: 2.5 V
Lead Channel Setting Pacing Pulse Width: 0.4 ms
Lead Channel Setting Sensing Sensitivity: 0.3 mV

## 2021-09-17 NOTE — Progress Notes (Signed)
Remote ICD transmission.   

## 2021-09-22 ENCOUNTER — Other Ambulatory Visit (HOSPITAL_COMMUNITY): Payer: Self-pay | Admitting: Cardiology

## 2021-09-24 ENCOUNTER — Other Ambulatory Visit (HOSPITAL_COMMUNITY): Payer: Self-pay

## 2021-09-24 MED ORDER — ENTRESTO 97-103 MG PO TABS: 1.0000 | ORAL_TABLET | Freq: Two times a day (BID) | ORAL | 0 refills | Status: DC

## 2021-10-02 ENCOUNTER — Other Ambulatory Visit (HOSPITAL_COMMUNITY): Payer: Self-pay | Admitting: Cardiology

## 2021-10-08 ENCOUNTER — Ambulatory Visit (INDEPENDENT_AMBULATORY_CARE_PROVIDER_SITE_OTHER): Payer: 59

## 2021-10-08 DIAGNOSIS — I5042 Chronic combined systolic (congestive) and diastolic (congestive) heart failure: Secondary | ICD-10-CM | POA: Diagnosis not present

## 2021-10-08 DIAGNOSIS — Z95810 Presence of automatic (implantable) cardiac defibrillator: Secondary | ICD-10-CM | POA: Diagnosis not present

## 2021-10-12 NOTE — Progress Notes (Signed)
EPIC Encounter for ICM Monitoring  Patient Name: David Bright is a 61 y.o. male Date: 10/12/2021 Primary Care Physican: Stacie Glaze, DO Primary Cardiologist: Nelson/McLean   Electrophysiologist:  Curt Bears  09/05/2021 Weight:  289 lbs           Spoke with patient and heart failure questions reviewed.  Pt asymptomatic for fluid accumulation.  Reports feeling well at this time and voices no complaints.          Optivol thoracic impedance suggesting normal fluid levels.           Prescribed:             Furosemide 40 mg take 1 tablet (40 mg total) daily. Spironolactone 25 mg take 1 tablet daily   Labs: 08/28/2021 Creatinine 1.28, BUN 24, Potassium 3.9, Sodium 141, GFR >60 06/18/2021 Creatinine 1.08, BUN 16, Potassium 3.3, Sodium 139, GFR >60 06/07/2021 Creatinine 1.29, BUN 21, Potassium 4.4, Sodium 139, GFR >60 A complete set of results can be found in Results Review   Recommendations:  No changes and encouraged to call if experiencing any fluid symptoms.   Follow-up plan: ICM clinic phone appointment on 11/12/2021.   91 day device clinic remote transmission 12/06/2021.     EP/Cardiology Office Visits: 12/27/2021 with Dr Aundra Dubin.   Copy of ICM check sent to Dr. Curt Bears.   3 month ICM trend: 10/08/2021.    12-14 Month ICM trend:     [name], RN 10/12/2021 3:17 PM

## 2021-11-01 ENCOUNTER — Telehealth (HOSPITAL_COMMUNITY): Payer: Self-pay

## 2021-11-01 NOTE — Telephone Encounter (Signed)
Pt. Called having issues with BP. Before taking medication it was systolic of 491 after medication systolic dropped to 93. Spoke with patient and asked how he was taking his medication Stated he was taking 1 tab of bidil in the morning and 1/2 in the evening instead of taking 1/2 tab Three times a day ?Instructed patient to take bidil as prescribed by provider and if his systolic drops below 95 (per last visit note) to call us back.  ?

## 2021-11-12 ENCOUNTER — Ambulatory Visit (INDEPENDENT_AMBULATORY_CARE_PROVIDER_SITE_OTHER): Payer: 59

## 2021-11-12 DIAGNOSIS — Z95810 Presence of automatic (implantable) cardiac defibrillator: Secondary | ICD-10-CM

## 2021-11-12 DIAGNOSIS — I5042 Chronic combined systolic (congestive) and diastolic (congestive) heart failure: Secondary | ICD-10-CM | POA: Diagnosis not present

## 2021-11-16 NOTE — Progress Notes (Signed)
EPIC Encounter for ICM Monitoring ? ?Patient Name: David Bright is a 61 y.o. male ?Date: 11/16/2021 ?Primary Care Physican: Stacie Glaze, DO ?Primary Cardiologist: Nelson/McLean   ?Electrophysiologist:  Camnitz  ?11/16/2021 Weight:  289 lbs ?  ?  ?      Spoke with patient and heart failure questions reviewed.  Pt asymptomatic for fluid accumulation.  Reports feeling well at this time and voices no complaints.  ?  ?      Optivol thoracic impedance suggesting normal fluid levels.   ?  ?      Prescribed:             ?Furosemide 40 mg take 1 tablet (40 mg total) daily. ?Spironolactone 25 mg take 1 tablet daily ?  ?Labs: ?08/28/2021 Creatinine 1.28, BUN 24, Potassium 3.9, Sodium 141, GFR >60 ?06/18/2021 Creatinine 1.08, BUN 16, Potassium 3.3, Sodium 139, GFR >60 ?06/07/2021 Creatinine 1.29, BUN 21, Potassium 4.4, Sodium 139, GFR >60 ?A complete set of results can be found in Results Review ?  ?Recommendations:  No changes and encouraged to call if experiencing any fluid symptoms. ?  ?Follow-up plan: ICM clinic phone appointment on 12/17/2021.   91 day device clinic remote transmission 12/06/2021.   ?  ?EP/Cardiology Office Visits: 12/27/2021 with Dr Aundra Dubin. ?  ?Copy of ICM check sent to Dr. Curt Bears ? ?3 month ICM trend: 11/12/2021. ? ? ? ?12-14 Month ICM trend:  ? ? ? ?[name], RN ?11/16/2021 ?2:19 PM ? ?

## 2021-12-06 ENCOUNTER — Ambulatory Visit (INDEPENDENT_AMBULATORY_CARE_PROVIDER_SITE_OTHER): Payer: 59

## 2021-12-06 DIAGNOSIS — I428 Other cardiomyopathies: Secondary | ICD-10-CM | POA: Diagnosis not present

## 2021-12-06 LAB — CUP PACEART REMOTE DEVICE CHECK
Battery Remaining Longevity: 66 mo
Battery Voltage: 2.99 V
Brady Statistic RV Percent Paced: 0 %
Date Time Interrogation Session: 20230427031803
HighPow Impedance: 70 Ohm
Implantable Lead Implant Date: 20160725
Implantable Lead Location: 753860
Implantable Lead Model: 293
Implantable Lead Serial Number: 383593
Implantable Pulse Generator Implant Date: 20160725
Lead Channel Impedance Value: 342 Ohm
Lead Channel Impedance Value: 380 Ohm
Lead Channel Pacing Threshold Amplitude: 0.875 V
Lead Channel Pacing Threshold Pulse Width: 0.4 ms
Lead Channel Sensing Intrinsic Amplitude: 11.125 mV
Lead Channel Sensing Intrinsic Amplitude: 11.125 mV
Lead Channel Setting Pacing Amplitude: 2.5 V
Lead Channel Setting Pacing Pulse Width: 0.4 ms
Lead Channel Setting Sensing Sensitivity: 0.3 mV

## 2021-12-17 ENCOUNTER — Ambulatory Visit (INDEPENDENT_AMBULATORY_CARE_PROVIDER_SITE_OTHER): Payer: 59

## 2021-12-17 DIAGNOSIS — I5042 Chronic combined systolic (congestive) and diastolic (congestive) heart failure: Secondary | ICD-10-CM

## 2021-12-17 DIAGNOSIS — Z95810 Presence of automatic (implantable) cardiac defibrillator: Secondary | ICD-10-CM

## 2021-12-20 NOTE — Progress Notes (Signed)
EPIC Encounter for ICM Monitoring ? ?Patient Name: David Bright is a 61 y.o. male ?Date: 12/20/2021 ?Primary Care Physican: Stacie Glaze, DO ?Primary Cardiologist: Nelson/McLean   ?Electrophysiologist:  Camnitz  ?11/16/2021 Weight:  289 lbs ?12/20/2021 Weight: 289 lbs ?  ?  ?      Spoke with patient and heart failure questions reviewed.  Pt asymptomatic for fluid accumulation.  Reports feeling well at this time and voices no complaints.   ?  ?      Optivol thoracic impedance suggesting normal fluid levels.   ?  ?      Prescribed:             ?Furosemide 40 mg take 1 tablet (40 mg total) daily. ?Spironolactone 25 mg take 1 tablet daily ?  ?Labs: ?08/28/2021 Creatinine 1.28, BUN 24, Potassium 3.9, Sodium 141, GFR >60 ?06/18/2021 Creatinine 1.08, BUN 16, Potassium 3.3, Sodium 139, GFR >60 ?06/07/2021 Creatinine 1.29, BUN 21, Potassium 4.4, Sodium 139, GFR >60 ?A complete set of results can be found in Results Review ?  ?Recommendations:   No changes and encouraged to call if experiencing any fluid symptoms. ?  ?Follow-up plan: ICM clinic phone appointment on 01/21/2022.   91 day device clinic remote transmission 03/07/2022.   ?  ?EP/Cardiology Office Visits: 12/27/2021 with Dr Aundra Dubin.  Recall 06/03/2022 with Dr Curt Bears. ?  ?Copy of ICM check sent to Dr. Curt Bears. ? ?3 month ICM trend: 12/17/2021. ? ? ? ?12-14 Month ICM trend:  ? ? ? ?[name], RN ?12/20/2021 ?9:01 AM ? ?

## 2021-12-20 NOTE — Progress Notes (Signed)
Remote ICD transmission.   

## 2021-12-27 ENCOUNTER — Ambulatory Visit (HOSPITAL_COMMUNITY)
Admission: RE | Admit: 2021-12-27 | Discharge: 2021-12-27 | Disposition: A | Discharge status: Home / Self Care | Payer: 59 | Source: Ambulatory Visit | Attending: Cardiology | Admitting: Cardiology

## 2021-12-27 ENCOUNTER — Encounter (HOSPITAL_COMMUNITY): Payer: Self-pay | Admitting: Cardiology

## 2021-12-27 VITALS — BP 100/60 | HR 74 | Wt 289.6 lb

## 2021-12-27 DIAGNOSIS — Z7902 Long term (current) use of antithrombotics/antiplatelets: Secondary | ICD-10-CM | POA: Diagnosis not present

## 2021-12-27 DIAGNOSIS — J449 Chronic obstructive pulmonary disease, unspecified: Secondary | ICD-10-CM | POA: Insufficient documentation

## 2021-12-27 DIAGNOSIS — I428 Other cardiomyopathies: Secondary | ICD-10-CM | POA: Diagnosis not present

## 2021-12-27 DIAGNOSIS — F17210 Nicotine dependence, cigarettes, uncomplicated: Secondary | ICD-10-CM | POA: Diagnosis not present

## 2021-12-27 DIAGNOSIS — Z79899 Other long term (current) drug therapy: Secondary | ICD-10-CM | POA: Diagnosis not present

## 2021-12-27 DIAGNOSIS — I739 Peripheral vascular disease, unspecified: Secondary | ICD-10-CM | POA: Insufficient documentation

## 2021-12-27 DIAGNOSIS — M25569 Pain in unspecified knee: Secondary | ICD-10-CM | POA: Insufficient documentation

## 2021-12-27 DIAGNOSIS — Z7982 Long term (current) use of aspirin: Secondary | ICD-10-CM | POA: Insufficient documentation

## 2021-12-27 DIAGNOSIS — G4733 Obstructive sleep apnea (adult) (pediatric): Secondary | ICD-10-CM | POA: Insufficient documentation

## 2021-12-27 DIAGNOSIS — I5042 Chronic combined systolic (congestive) and diastolic (congestive) heart failure: Secondary | ICD-10-CM | POA: Insufficient documentation

## 2021-12-27 DIAGNOSIS — H40053 Ocular hypertension, bilateral: Secondary | ICD-10-CM

## 2021-12-27 LAB — BASIC METABOLIC PANEL
Anion gap: 8 (ref 5–15)
BUN: 21 mg/dL (ref 8–23)
CO2: 26 mmol/L (ref 22–32)
Calcium: 8.7 mg/dL — ABNORMAL LOW (ref 8.9–10.3)
Chloride: 106 mmol/L (ref 98–111)
Creatinine, Ser: 1.24 mg/dL (ref 0.61–1.24)
GFR, Estimated: >60 mL/min (ref >60)
Glucose, Bld: 199 mg/dL — ABNORMAL HIGH (ref 70–99)
Potassium: 3.8 mmol/L (ref 3.5–5.1)
Sodium: 140 mmol/L (ref 135–145)

## 2021-12-27 NOTE — Progress Notes (Signed)
ID:  David Bright, DOB April 23, 1961, MRN [id number]   Provider location: [facility] Type of Visit: Established patient   PCP:  Stacie Glaze, DO  HF Cardiology: Dr. Aundra Dubin   History of Present Illness: David Bright is a 61 y.o. male who has a history of chronic systolic CHF and COPD/active smoking.  He has a known nonischemic cardiomyopathy, echo in 1/17 with EF 20% and a severely dilated LV.  No coronary disease on 7/16 cardiac cath.  Repeat echo 2/18, showing EF up to 40%.   He had significant PAD by peripheral arterial dopplers, saw Dr. Gwenlyn Found with plan of medical management for now. He also has knee pain which is limiting.   Echo 09/2017: EF up to 50-55% with mild LVH. Echo in 1/20 showed EF 55-60%.  Echo in 3/21 showed EF 55-60% with normal RV.  Echo in 11/22 with EF 60-65%, moderate LVH, normal RV.   He returns for followup of CHF.  Weight is stable.  Still smoking 1 ppd.  Using CPAP. No dyspnea walking on flat ground.  Legs get "weak" when he walks up stairs.  No chest pain.  No orthopnea/PND.  No lightheadedness.   Medtronic device interrogation: fluid index < threshold, no VT/AF.   ECG (personally reviewed): NSR, poor RWP, LAFB  Labs (12/17): K 4.3, creatinine 1.09 Labs (1/18): K 3.9, creatinine 1.13 Labs (3/18): K 4.3, creatinine 1.48, NT-proBNP 61 Labs (4/18): K 4.5, creatinine 1.23, LDL 71, BNP 34 Labs (5/18): K 4.5, creatinine 1.2 Labs (1/19): K 4.2, creatinine 1.19, LDL 69, HDL 39 Labs (11/19): LDL 66, HDL 34, K 3.3, creatinine 1.19, BNP 45 Labs (12/19): K 4.4, creatinine 1.59 Labs (8/20): K 4.5, creatinine 1.66 Labs (2/21): K 4.5, creatinine 1.63 Labs (3/21): LDL 64, HDL 38 Labs (6/21): K 4.2, creatinine 1.24 Labs (10/21): K 4.1, creatinine 1.43 Labs (1/23): K 3.9, creatinine 1.28  PMH:  1. Chronic systolic CHF: Nonischemic cardiomyopathy.  Has Medtronic ICD.   - LHC (7/16) with nonobstructive disease.  - Echo (1/17) with EF 20%,  severe LV dilation, mild to moderate MR.  - Echo (2/18): EF 40%, mild LV dilation.  - Echo (5/19): EF 50-55%, mild LVH - Echo (1/20): EF 55-60%, mild LVH, normal RV size and systolic function.  - Echo (3/20): EF 50-55%, RV normal, IVC normal - Echo (3/21): EF 55-60%, mild LVH - Echo (11/22): EF 60-65%, moderate LVH, normal RV.  2. COPD: Active smoker.  - PFTs (2/18) with minimal obstruction, mild restriction suggesting a parenchymal process, low DLCO.  - CT chest (6/18): No ILD, +emphysema, lung nodules.  - CT chest (11/19): Emphysema, no ILD, stable nodules 3. Type II diabetes with peripheral neuropathy.  4. OSA on CPAP 5. HTN 6. Hyperlipidemia 7. PAD: ABIs abnormal at outside office.  - Peripheral arterial dopplers (3/18) with > 50% left CIA and bilateral EIA stenosis, 50-74% mid LSFA stenosis.  - ABIs (2/19): 0.85 right, 0.86 left (mild).  - ABIs (5/20): 0.89 right, 0.86 left (mild).  - ABIs (5/21): 0.9 right, 0.84 left (mild) - ABIs (11/22): 0.9 right, 0.84 left (mild) 8. CKD: Stage 3.  9. B12 deficiency.  10. COVID-19 7/22  SH: Lives in [place], works at [employer], smokes about 4-5 cigs/day now.  Rare ETOH.  Remote substance abuse.   FH: Aunt with CHF, brother with ESRD.    Review of systems complete and found to be negative unless listed in HPI.   Current  Outpatient Medications  Medication Sig Dispense Refill   aspirin EC 81 MG tablet Take 1 tablet (81 mg total) by mouth daily. 30 tablet 0   atorvastatin (LIPITOR) 20 MG tablet Take 1 tablet (20 mg total) by mouth daily. 30 tablet 3   carvedilol (COREG) 6.25 MG tablet TAKE 1 TABLET BY MOUTH TWICE DAILY WITH MEALS 180 tablet 0   dapagliflozin propanediol (FARXIGA) 10 MG TABS tablet Take 1 tablet (10 mg total) by mouth daily before breakfast. 30 tablet 6   furosemide (LASIX) 40 MG tablet Take 40 mg by mouth daily.     insulin glargine (LANTUS) 100 UNIT/ML injection Inject 20 Units into the skin at bedtime.      sacubitril-valsartan (ENTRESTO) 97-103 MG Take 1 tablet by mouth 2 (two) times daily. 180 tablet 0   spironolactone (ALDACTONE) 25 MG tablet Take 1 tablet by mouth once daily 90 tablet 3   vitamin B-12 (CYANOCOBALAMIN) 500 MCG tablet Take 500 mcg by mouth daily.     Vitamin D, Ergocalciferol, (DRISDOL) 1.25 MG (50000 UNIT) CAPS capsule Take 50,000 Units by mouth once a week.     No current facility-administered medications for this encounter.   Exam:   BP 100/60   Pulse 74   Wt 131.4 kg (289 lb 9.6 oz)   SpO2 97%   BMI 39.28 kg/m  General: NAD Neck: Thick. No JVD, no thyromegaly or thyroid nodule.  Lungs: Clear to auscultation bilaterally with normal respiratory effort. CV: Nondisplaced PMI.  Heart regular S1/S2, no S3/S4, no murmur.  No peripheral edema.  No carotid bruit.  Difficult to palpate pedal pulses.  Abdomen: Soft, nontender, no hepatosplenomegaly, no distention.  Skin: Intact without lesions or rashes.  Neurologic: Alert and oriented x 3.  Psych: Normal affect. Extremities: No clubbing or cyanosis.  HEENT: Normal.   Assessment/Plan: 1. Chronic systolic CHF: Nonischemic cardiomyopathy => ?due to viral myocarditis or prior substance abuse.  Medtronic ICD.  Echo 1/17 with severely dilated LV and EF 20%, EF up to 40% on echo 2/18.  Echo in 1/20 showed EF up to 55% with normal RV, and echo in 3/21 and again in 11/22 showed EF 60%.  NYHA class I-II symptoms.  He is not volume overloaded by exam or Optivol.  - Continue Lasix 40 mg daily, BMET today.    - Continue spironolactone 25 mg daily, Entresto 97/103 bid, Coreg 6.25 mg bid, dapagliflozin.   - Stop Bidil with soft BP.  2. Smoking/suspect COPD:  PFTs done, somewhat concerning for interstitial lung disease rather than emphysema with restrictive PFTs and decreased DLCO => no ILD on CT, emphysema noted along with lung nodules. Most recent repeat CT in 11/19 showed stable nodules, do not need repeat. He continues to smoke about 2/3  pack/day.  Failed Wellbutrin and Chantix.   - I encouraged him again to quit smoking.  3. OSA: Continue CPAP. No change.  4. PAD: Significant disease on peripheral dopplers, stable mild claudication. He saw Dr. Gwenlyn Found, medical management recommended.  I encouraged him to try to walk through the pain. ABIs in 11/22 mildly reduced.  - Continue ASA 81 and statin.    BMET 3 months, followup 6 months.   Signed, Loralie Champagne, MD  12/27/2021  [facility] [address] [facility] [place] [address] [phone] (office) [phone] (fax)

## 2021-12-27 NOTE — Patient Instructions (Addendum)
Stop Bidil.  Labs done today, your results will be available in MyChart, we will contact you for abnormal readings.  Your physician has requested that you have an echocardiogram. Echocardiography is a painless test that uses sound waves to create images of your heart. It provides your doctor with information about the size and shape of your heart and how well your heart's chambers and valves are working. This procedure takes approximately one hour. There are no restrictions for this procedure.  Repeat blood work in 3 months.  Your physician recommends that you schedule a follow-up appointment in: 6 months with an echocardiogram (November 2023)  **PLEASE CALL THE AUGUST TO ARRANGE YOUR FOLLOW UP APPOINTMENT   If you have any questions or concerns before your next appointment please send [place] a message through [place] or call our office at [phone].    TO LEAVE A MESSAGE FOR THE NURSE SELECT OPTION 2, PLEASE LEAVE A MESSAGE INCLUDING: YOUR NAME DATE OF BIRTH CALL BACK NUMBER REASON FOR CALL**this is important as we prioritize the call backs  YOU WILL RECEIVE A CALL BACK THE SAME DAY AS LONG AS YOU CALL BEFORE 4:00 PM  At the [facility], you and your health needs are our priority. As part of our continuing mission to provide you with exceptional heart care, we have created designated Provider Care Teams. These Care Teams include your primary Cardiologist (physician) and Advanced Practice Providers (APPs- Physician Assistants and Nurse Practitioners) who all work together to provide you with the care you need, when you need it.   You may see any of the following providers on your designated Care Team at your next follow up: Dr Glori Bickers Dr Haynes Kerns, NP Lyda Jester, [place] [facility] [place], [place] Audry Riles, PharmD   Please be sure to bring in all your medications bottles to every appointment.

## 2022-01-05 ENCOUNTER — Other Ambulatory Visit (HOSPITAL_COMMUNITY): Payer: Self-pay | Admitting: Cardiology

## 2022-01-21 ENCOUNTER — Ambulatory Visit (INDEPENDENT_AMBULATORY_CARE_PROVIDER_SITE_OTHER): Payer: 59

## 2022-01-21 DIAGNOSIS — Z95810 Presence of automatic (implantable) cardiac defibrillator: Secondary | ICD-10-CM

## 2022-01-21 DIAGNOSIS — I5042 Chronic combined systolic (congestive) and diastolic (congestive) heart failure: Secondary | ICD-10-CM | POA: Diagnosis not present

## 2022-01-25 NOTE — Progress Notes (Signed)
EPIC Encounter for ICM Monitoring  Patient Name: David Bright is a 61 y.o. male Date: 01/25/2022 Primary Care Physican: Stacie Glaze, DO Primary Cardiologist: Nelson/McLean   Electrophysiologist:  Curt Bears  11/16/2021 Weight:  289 lbs 12/20/2021 Weight: 289 lbs           Spoke with patient and heart failure questions reviewed.  Pt asymptomatic for fluid accumulation.  Reports feeling well at this time and voices no complaints.           Optivol thoracic impedance suggesting normal fluid levels.           Prescribed:             Furosemide 40 mg take 1 tablet (40 mg total) daily. Spironolactone 25 mg take 1 tablet daily   Labs: 08/28/2021 Creatinine 1.28, BUN 24, Potassium 3.9, Sodium 141, GFR >60 06/18/2021 Creatinine 1.08, BUN 16, Potassium 3.3, Sodium 139, GFR >60 06/07/2021 Creatinine 1.29, BUN 21, Potassium 4.4, Sodium 139, GFR >60 A complete set of results can be found in Results Review   Recommendations:   No changes and encouraged to call if experiencing any fluid symptoms.   Follow-up plan: ICM clinic phone appointment on 02/25/2022.   91 day device clinic remote transmission 03/07/2022.     EP/Cardiology Office Visits:  Recall 06/25/2022 with Dr Aundra Dubin.  Recall 06/03/2022 with Dr Curt Bears.   Copy of ICM check sent to Dr. Curt Bears.  3 month ICM trend: 01/21/2022.    12-14 Month ICM trend:     [name], RN 01/25/2022 2:49 PM

## 2022-02-25 ENCOUNTER — Ambulatory Visit (INDEPENDENT_AMBULATORY_CARE_PROVIDER_SITE_OTHER): Payer: 59

## 2022-02-25 DIAGNOSIS — I5042 Chronic combined systolic (congestive) and diastolic (congestive) heart failure: Secondary | ICD-10-CM

## 2022-02-25 DIAGNOSIS — Z95810 Presence of automatic (implantable) cardiac defibrillator: Secondary | ICD-10-CM

## 2022-02-27 NOTE — Progress Notes (Signed)
EPIC Encounter for ICM Monitoring  Patient Name: David Bright is a 61 y.o. male Date: 02/27/2022 Primary Care Physican: Stacie Glaze, DO Primary Cardiologist: Nelson/McLean   Electrophysiologist:  Curt Bears  11/16/2021 Weight:  289 lbs 12/20/2021 Weight: 289 lbs           Spoke with patient and heart failure questions reviewed.  Pt asymptomatic for fluid accumulation.  Reports feeling well at this time and voices no complaints.           Optivol thoracic impedance normal but was suggesting possible fluid accumulation from 6/26-7/10 but return to normal.           Prescribed:             Furosemide 40 mg take 1 tablet (40 mg total) daily. Spironolactone 25 mg take 1 tablet daily   Labs: 08/28/2021 Creatinine 1.28, BUN 24, Potassium 3.9, Sodium 141, GFR >60 06/18/2021 Creatinine 1.08, BUN 16, Potassium 3.3, Sodium 139, GFR >60 06/07/2021 Creatinine 1.29, BUN 21, Potassium 4.4, Sodium 139, GFR >60 A complete set of results can be found in Results Review   Recommendations:   No changes and encouraged to call if experiencing any fluid symptoms.   Follow-up plan: ICM clinic phone appointment on 04/01/2022.   91 day device clinic remote transmission 03/07/2022.     EP/Cardiology Office Visits:  Recall 06/25/2022 with Dr Aundra Dubin.  Recall 06/03/2022 with Dr Curt Bears.   Copy of ICM check sent to Dr. Curt Bears.  3 month ICM trend: 02/25/2022.    12-14 Month ICM trend:     [name], RN 02/27/2022 3:11 PM

## 2022-03-07 ENCOUNTER — Ambulatory Visit (INDEPENDENT_AMBULATORY_CARE_PROVIDER_SITE_OTHER): Payer: 59

## 2022-03-07 DIAGNOSIS — I428 Other cardiomyopathies: Secondary | ICD-10-CM

## 2022-03-07 LAB — CUP PACEART REMOTE DEVICE CHECK
Battery Remaining Longevity: 58 mo
Battery Voltage: 2.98 V
Brady Statistic RV Percent Paced: 0.01 %
Date Time Interrogation Session: 20230727022823
HighPow Impedance: 81 Ohm
Implantable Lead Implant Date: 20160725
Implantable Lead Location: 753860
Implantable Lead Model: 293
Implantable Lead Serial Number: 383593
Implantable Pulse Generator Implant Date: 20160725
Lead Channel Impedance Value: 399 Ohm
Lead Channel Impedance Value: 399 Ohm
Lead Channel Pacing Threshold Amplitude: 0.75 V
Lead Channel Pacing Threshold Pulse Width: 0.4 ms
Lead Channel Sensing Intrinsic Amplitude: 15.25 mV
Lead Channel Sensing Intrinsic Amplitude: 15.25 mV
Lead Channel Setting Pacing Amplitude: 2.5 V
Lead Channel Setting Pacing Pulse Width: 0.4 ms
Lead Channel Setting Sensing Sensitivity: 0.3 mV

## 2022-03-29 NOTE — Progress Notes (Signed)
Remote ICD transmission.   

## 2022-04-01 ENCOUNTER — Ambulatory Visit (HOSPITAL_COMMUNITY)
Admission: RE | Admit: 2022-04-01 | Discharge: 2022-04-01 | Disposition: A | Discharge status: Home / Self Care | Payer: 59 | Source: Ambulatory Visit | Attending: Internal Medicine | Admitting: Internal Medicine

## 2022-04-01 ENCOUNTER — Ambulatory Visit (INDEPENDENT_AMBULATORY_CARE_PROVIDER_SITE_OTHER): Payer: 59

## 2022-04-01 DIAGNOSIS — Z95810 Presence of automatic (implantable) cardiac defibrillator: Secondary | ICD-10-CM | POA: Diagnosis not present

## 2022-04-01 DIAGNOSIS — I5042 Chronic combined systolic (congestive) and diastolic (congestive) heart failure: Secondary | ICD-10-CM

## 2022-04-01 LAB — BASIC METABOLIC PANEL
Anion gap: 14 (ref 5–15)
BUN: 25 mg/dL — ABNORMAL HIGH (ref 8–23)
CO2: 21 mmol/L — ABNORMAL LOW (ref 22–32)
Calcium: 9.2 mg/dL (ref 8.9–10.3)
Chloride: 102 mmol/L (ref 98–111)
Creatinine, Ser: 1.16 mg/dL (ref 0.61–1.24)
GFR, Estimated: >60 mL/min (ref >60)
Glucose, Bld: 271 mg/dL — ABNORMAL HIGH (ref 70–99)
Potassium: 4 mmol/L (ref 3.5–5.1)
Sodium: 137 mmol/L (ref 135–145)

## 2022-04-05 NOTE — Progress Notes (Signed)
EPIC Encounter for ICM Monitoring  Patient Name: David Bright is a 61 y.o. male Date: 04/05/2022 Primary Care Physican: Stacie Glaze, DO Primary Cardiologist: Nelson/McLean   Electrophysiologist:  Curt Bears  11/16/2021 Weight:  289 lbs 12/20/2021 Weight: 289 lbs 04/05/2022 Weight: 288 lbs           Spoke with patient and heart failure questions reviewed.  Pt asymptomatic for fluid accumulation.  Reports feeling well at this time and voices no complaints.           Optivol thoracic impedance suggesting normal fluid levels.             Prescribed:             Furosemide 40 mg take 1 tablet (40 mg total) daily. Spironolactone 25 mg take 1 tablet daily   Labs: 08/28/2021 Creatinine 1.28, BUN 24, Potassium 3.9, Sodium 141, GFR >60 06/18/2021 Creatinine 1.08, BUN 16, Potassium 3.3, Sodium 139, GFR >60 06/07/2021 Creatinine 1.29, BUN 21, Potassium 4.4, Sodium 139, GFR >60 A complete set of results can be found in Results Review   Recommendations:   No changes and encouraged to call if experiencing any fluid symptoms.   Follow-up plan: ICM clinic phone appointment on 05/06/2022.   91 day device clinic remote transmission 06/06/2022.     EP/Cardiology Office Visits:  Recall 06/25/2022 with Dr Aundra Dubin.  Recall 06/03/2022 with Dr Curt Bears.   Copy of ICM check sent to Dr. Curt Bears.  3 month ICM trend: 04/01/2022.    12-14 Month ICM trend:     [name], RN 04/05/2022 2:03 PM

## 2022-05-06 ENCOUNTER — Ambulatory Visit (INDEPENDENT_AMBULATORY_CARE_PROVIDER_SITE_OTHER): Payer: 59

## 2022-05-06 DIAGNOSIS — Z95810 Presence of automatic (implantable) cardiac defibrillator: Secondary | ICD-10-CM | POA: Diagnosis not present

## 2022-05-06 DIAGNOSIS — I5042 Chronic combined systolic (congestive) and diastolic (congestive) heart failure: Secondary | ICD-10-CM | POA: Diagnosis not present

## 2022-05-09 NOTE — Progress Notes (Signed)
EPIC Encounter for ICM Monitoring  Patient Name: David Bright is a 62 y.o. male Date: 05/09/2022 Primary Care Physican: Stacie Glaze, DO Primary Cardiologist: Nelson/McLean   Electrophysiologist:  Caryl Comes 11/16/2021 Weight:  289 lbs 12/20/2021 Weight: 289 lbs 04/05/2022 Weight: 288 lbs           Spoke with patient and heart failure questions reviewed.  Transmission results reviewed.  Pt asymptomatic for fluid accumulation.  Reports feeling well at this time and voices no complaints.             Optivol thoracic impedance suggesting normal fluid levels.             Prescribed:             Furosemide 40 mg take 1 tablet (40 mg total) daily. Spironolactone 25 mg take 1 tablet daily   Labs: 08/28/2021 Creatinine 1.28, BUN 24, Potassium 3.9, Sodium 141, GFR >60 06/18/2021 Creatinine 1.08, BUN 16, Potassium 3.3, Sodium 139, GFR >60 06/07/2021 Creatinine 1.29, BUN 21, Potassium 4.4, Sodium 139, GFR >60 A complete set of results can be found in Results Review   Recommendations:   No changes and encouraged to call if experiencing any fluid symptoms.   Follow-up plan: ICM clinic phone appointment on 06/10/2022.   91 day device clinic remote transmission 06/06/2022.     EP/Cardiology Office Visits:  Recall 06/25/2022 with Dr Aundra Dubin.     Copy of ICM check sent to Dr. Caryl Comes.  3 month ICM trend: 05/06/2022.    12-14 Month ICM trend:     [name], RN 05/09/2022 3:58 PM

## 2022-06-06 ENCOUNTER — Ambulatory Visit (INDEPENDENT_AMBULATORY_CARE_PROVIDER_SITE_OTHER): Payer: 59

## 2022-06-06 DIAGNOSIS — I428 Other cardiomyopathies: Secondary | ICD-10-CM

## 2022-06-07 LAB — CUP PACEART REMOTE DEVICE CHECK
Battery Remaining Longevity: 56 mo
Battery Voltage: 2.98 V
Brady Statistic RV Percent Paced: 0.01 %
Date Time Interrogation Session: 20231026022602
HighPow Impedance: 72 Ohm
Implantable Lead Connection Status: 753985
Implantable Lead Implant Date: 20160725
Implantable Lead Location: 753860
Implantable Lead Model: 293
Implantable Lead Serial Number: 383593
Implantable Pulse Generator Implant Date: 20160725
Lead Channel Impedance Value: 380 Ohm
Lead Channel Impedance Value: 399 Ohm
Lead Channel Pacing Threshold Amplitude: 0.875 V
Lead Channel Pacing Threshold Pulse Width: 0.4 ms
Lead Channel Sensing Intrinsic Amplitude: 14.75 mV
Lead Channel Sensing Intrinsic Amplitude: 14.75 mV
Lead Channel Setting Pacing Amplitude: 2.5 V
Lead Channel Setting Pacing Pulse Width: 0.4 ms
Lead Channel Setting Sensing Sensitivity: 0.3 mV
Zone Setting Status: 755011

## 2022-06-10 ENCOUNTER — Ambulatory Visit (INDEPENDENT_AMBULATORY_CARE_PROVIDER_SITE_OTHER): Payer: 59

## 2022-06-10 DIAGNOSIS — Z95810 Presence of automatic (implantable) cardiac defibrillator: Secondary | ICD-10-CM

## 2022-06-10 DIAGNOSIS — I5042 Chronic combined systolic (congestive) and diastolic (congestive) heart failure: Secondary | ICD-10-CM | POA: Diagnosis not present

## 2022-06-12 NOTE — Progress Notes (Signed)
EPIC Encounter for ICM Monitoring  Patient Name: David Bright is a 61 y.o. male Date: 06/12/2022 Primary Care Physican: Stacie Glaze, DO Primary Cardiologist: Nelson/McLean   Electrophysiologist:  Caryl Comes 11/16/2021 Weight:  289 lbs 12/20/2021 Weight: 289 lbs 04/05/2022 Weight: 288 lbs           Spoke with patient and heart failure questions reviewed.  Transmission results reviewed.  Pt asymptomatic for fluid accumulation.  Reports feeling well at this time and voices no complaints.           Optivol thoracic impedance normal but suggesting intermittent days with possible fluid accumulation.             Prescribed:             Furosemide 40 mg take 1.5 tablets (60 mg total) daily. Spironolactone 25 mg take 1 tablet daily   Labs: 04/01/2022 Creatinine 1.16, BUN 25, Potassium 4.0, Sodium 137, GFR >60 12/27/2021 Creatinine 1.24, BUN 21, Potassium 3.8, Sodium 140, GFR >60  A complete set of results can be found in Results Review   Recommendations:   No changes and encouraged to call if experiencing any fluid symptoms.   Follow-up plan: ICM clinic phone appointment on 07/15/2022.   91 day device clinic remote transmission 09/05/2022.     EP/Cardiology Office Visits:  Recall 06/25/2022 with Dr Aundra Dubin.  Recall 06/03/2022 with Dr Curt Bears.   Copy of ICM check sent to Dr. Caryl Comes.   3 month ICM trend: 06/10/2022.    12-14 Month ICM trend:     [name], RN 06/12/2022 6:35 AM

## 2022-06-13 NOTE — Progress Notes (Signed)
Remote ICD transmission.   

## 2022-07-15 ENCOUNTER — Ambulatory Visit (INDEPENDENT_AMBULATORY_CARE_PROVIDER_SITE_OTHER): Payer: 59

## 2022-07-15 DIAGNOSIS — Z95810 Presence of automatic (implantable) cardiac defibrillator: Secondary | ICD-10-CM

## 2022-07-15 DIAGNOSIS — I5042 Chronic combined systolic (congestive) and diastolic (congestive) heart failure: Secondary | ICD-10-CM

## 2022-07-19 ENCOUNTER — Telehealth: Payer: Self-pay

## 2022-07-19 NOTE — Telephone Encounter (Signed)
Remote ICM transmission received.  Attempted call to patient regarding ICM remote transmission and left detailed message per DPR.  Advised to return call for any fluid symptoms or questions. Next ICM remote transmission scheduled 08/26/2022.

## 2022-07-19 NOTE — Progress Notes (Signed)
EPIC Encounter for ICM Monitoring  Patient Name: David Bright is a 61 y.o. male Date: 07/19/2022 Primary Care Physican: Stacie Glaze, DO Primary Cardiologist: Nelson/McLean   Electrophysiologist:  Caryl Comes 11/16/2021 Weight:  289 lbs 12/20/2021 Weight: 289 lbs 04/05/2022 Weight: 288 lbs           Attempted call to patient and unable to reach.  Left detailed message per DPR regarding transmission. Transmission reviewed. ts.           Optivol thoracic impedance suggesting normal fluid levels.             Prescribed:             Furosemide 40 mg take 1.5 tablets (60 mg total) daily. Spironolactone 25 mg take 1 tablet daily   Labs: 04/01/2022 Creatinine 1.16, BUN 25, Potassium 4.0, Sodium 137, GFR >60 12/27/2021 Creatinine 1.24, BUN 21, Potassium 3.8, Sodium 140, GFR >60  A complete set of results can be found in Results Review   Recommendations:   Left voice mail with ICM number and encouraged to call if experiencing any fluid symptoms.   Follow-up plan: ICM clinic phone appointment on 08/26/2022.   91 day device clinic remote transmission 09/05/2022.     EP/Cardiology Office Visits:  Recall 06/25/2022 with Dr Aundra Dubin.  08/21/2022 with Dr Curt Bears.   Copy of ICM check sent to Dr. Caryl Comes.    3 month ICM trend: 07/15/2022.    12-14 Month ICM trend:     [name], RN 07/19/2022 2:54 PM

## 2022-08-21 ENCOUNTER — Encounter: Payer: Self-pay | Admitting: Cardiology

## 2022-08-21 ENCOUNTER — Ambulatory Visit: Payer: 59 | Attending: Cardiology | Admitting: Cardiology

## 2022-08-21 VITALS — BP 124/78 | HR 89 | Ht 72.0 in | Wt 280.0 lb

## 2022-08-21 DIAGNOSIS — Z95810 Presence of automatic (implantable) cardiac defibrillator: Secondary | ICD-10-CM | POA: Diagnosis not present

## 2022-08-21 DIAGNOSIS — I5022 Chronic systolic (congestive) heart failure: Secondary | ICD-10-CM | POA: Diagnosis not present

## 2022-08-21 NOTE — Progress Notes (Signed)
Electrophysiology Office Note   Date:  08/21/2022   ID:  David Bright, DOB 01-Sep-1960, MRN [id number]  PCP:  Stacie Glaze, DO  Cardiologist: Algernon Huxley Primary Electrophysiologist:  Tameia Rafferty Meredith Leeds, MD    Chief Complaint: CHF   History of Present Illness: David Bright is a 62 y.o. male who is being seen today for the evaluation of CHF at the request of Stacie Glaze, DO. Presenting today for electrophysiology evaluation.  He has a history significant for chronic systolic heart failure, COPD, tobacco abuse, hypertension, hyperlipidemia, sleep apnea, type 2 diabetes.  He is status post Medtronic ICD implanted in 2016 by Dr. Caryl Comes.  Today, denies symptoms of palpitations, chest pain, shortness of breath, orthopnea, PND, lower extremity edema, claudication, dizziness, presyncope, syncope, bleeding, or neurologic sequela. The patient is tolerating medications without difficulties.     Past Medical History:  Diagnosis Date   Chronic combined systolic and diastolic CHF (congestive heart failure) (HCC)    Systolic and diastolic   COPD (chronic obstructive pulmonary disease) (HCC)    Hyperlipidemia    Hypertension    Non compliance with medical treatment    Non-ischemic cardiomyopathy ([place])    a. Normal cath 2002;  b. 01/2015 Echo: EF 15-20%, mod conc LVH, restrictive physiology, mod MR, sev dil LA, triv TR/PI;  c. 02/2015 Cath: LM nl, LAD 30d, LCX nl, OM1 small, nl, RCA nl;  d. 02/2015 s/p MDT single lead AICD (ser# [id number]).   OSA (obstructive sleep apnea) 06/04/2015   Mild with AHI 12.3/hr and AHI 24.5/hr in REM sleep.   Polysubstance abuse ([place])    History of quitting alcohol, marijuana and crack over 12 years ago   Type II diabetes mellitus ([place])    Past Surgical History:  Procedure Laterality Date   CARDIAC CATHETERIZATION N/A 03/06/2015   Procedure: Right/Left Heart Cath and Coronary Angiography;  Surgeon: Belva Crome, MD;  Location: [facility];   Service: Cardiovascular;  Laterality: N/A;   EP IMPLANTABLE DEVICE N/A 03/06/2015   Procedure: ICD Implant;  Surgeon: Deboraha Sprang, MD;  Location: [facility];  Service: Cardiovascular;  Laterality: N/A;   none       Current Outpatient Medications  Medication Sig Dispense Refill   aspirin EC 81 MG tablet Take 1 tablet (81 mg total) by mouth daily. 30 tablet 0   atorvastatin (LIPITOR) 20 MG tablet Take 1 tablet (20 mg total) by mouth daily. 30 tablet 3   carvedilol (COREG) 6.25 MG tablet TAKE 1 TABLET BY MOUTH TWICE DAILY WITH MEALS 180 tablet 3   dapagliflozin propanediol (FARXIGA) 10 MG TABS tablet Take 1 tablet (10 mg total) by mouth daily before breakfast. 30 tablet 6   ENTRESTO 97-103 MG Take 1 tablet by mouth twice daily 180 tablet 3   furosemide (LASIX) 40 MG tablet Take 1.5 tablets (60 mg total) by mouth daily. 135 tablet 3   insulin glargine (LANTUS) 100 UNIT/ML injection Inject 20 Units into the skin at bedtime.     ivabradine (CORLANOR) 5 MG TABS tablet Take 5 mg by mouth 2 (two) times daily with a meal.     OZEMPIC, 1 MG/DOSE, 4 MG/3ML SOPN Inject 1 mg into the skin once a week.     sildenafil (VIAGRA) 100 MG tablet SMARTSIG:1 Tablet(s) By Mouth Every 3 Days PRN     spironolactone (ALDACTONE) 25 MG tablet Take 1 tablet by mouth once daily 90 tablet 3   vitamin B-12 (  CYANOCOBALAMIN) 500 MCG tablet Take 500 mcg by mouth daily.     Vitamin D, Ergocalciferol, (DRISDOL) 1.25 MG (50000 UNIT) CAPS capsule Take 50,000 Units by mouth once a week.     No current facility-administered medications for this visit.    Allergies:   Patient has no known allergies.   Social History:  The patient  reports that he has been smoking cigarettes. He has a 33.00 pack-year smoking history. He has never used smokeless tobacco. He reports that he does not drink alcohol and does not use drugs.   Family History:  The patient's family history includes Cancer in his mother.   ROS:  Please see the  history of present illness.   Otherwise, review of systems is positive for none.   All other systems are reviewed and negative.   PHYSICAL EXAM: VS:  BP 124/78   Pulse 89   Ht 6' (1.829 m)   Wt 280 lb (127 kg)   SpO2 97%   BMI 37.97 kg/m  , BMI Body mass index is 37.97 kg/m. GEN: Well nourished, well developed, in no acute distress  HEENT: normal  Neck: no JVD, carotid bruits, or masses Cardiac: RRR; no murmurs, rubs, or gallops,no edema  Respiratory:  clear to auscultation bilaterally, normal work of breathing GI: soft, nontender, nondistended, + BS MS: no deformity or atrophy  Skin: warm and dry, device site well healed Neuro:  Strength and sensation are intact Psych: euthymic mood, full affect  EKG:  EKG is ordered today. Personal review of the ekg ordered shows sinus rhythm, rate 89  Personal review of the device interrogation today. Results in [facility]: 04/01/2022: BUN 25; Creatinine, Ser 1.16; Potassium 4.0; Sodium 137    Lipid Panel     Component Value Date/Time   CHOL 118 06/07/2021 1300   TRIG 76 06/07/2021 1300   HDL 32 (L) 06/07/2021 1300   CHOLHDL 3.7 06/07/2021 1300   VLDL 15 06/07/2021 1300   LDLCALC 71 06/07/2021 1300     Wt Readings from Last 3 Encounters:  08/21/22 280 lb (127 kg)  12/27/21 289 lb 9.6 oz (131.4 kg)  08/28/21 289 lb 3.2 oz (131.2 kg)      Other studies Reviewed: Additional studies/ records that were reviewed today include: TTE 10/19/2019 Review of the above records today demonstrates:   1. Left ventricular ejection fraction, by estimation, is 55 to 60%. The  left ventricle has normal function. The left ventricle has no regional  wall motion abnormalities. There is mild concentric left ventricular  hypertrophy. Left ventricular diastolic  parameters are consistent with Grade I diastolic dysfunction (impaired  relaxation).   2. Right ventricular systolic function is normal. The right ventricular  size is normal.  There is normal pulmonary artery systolic pressure.   3. The mitral valve is normal in structure. No evidence of mitral valve  regurgitation. No evidence of mitral stenosis.   4. The aortic valve is tricuspid. Aortic valve regurgitation is not  visualized. No aortic stenosis is present.   5. The inferior vena cava is normal in size with greater than 50%  respiratory variability, suggesting right atrial pressure of 3 mmHg.    ASSESSMENT AND PLAN:  1.  Chronic systolic heart failure: Currently on Aldactone, carvedilol, Farxiga, Entresto, doses above.  Is status post Medtronic ICD implanted in 2016.  Device function appropriate.  No changes.  2.  Obstructive sleep apnea: CPAP compliance encouraged   Current medicines are reviewed at  length with the patient today.   The patient does not have concerns regarding his medicines.  The following changes were made today:  none  Labs/ tests ordered today include:  Orders Placed This Encounter  Procedures   EKG 12-Lead      Disposition:   FU 12 year  Signed, Saahas Hidrogo Meredith Leeds, MD  08/21/2022 3:30 PM     [place] [address] [phone] (office) [phone] (fax)

## 2022-08-26 ENCOUNTER — Ambulatory Visit (INDEPENDENT_AMBULATORY_CARE_PROVIDER_SITE_OTHER): Payer: 59

## 2022-08-26 DIAGNOSIS — I5022 Chronic systolic (congestive) heart failure: Secondary | ICD-10-CM | POA: Diagnosis not present

## 2022-08-26 DIAGNOSIS — Z95810 Presence of automatic (implantable) cardiac defibrillator: Secondary | ICD-10-CM

## 2022-08-29 ENCOUNTER — Telehealth: Payer: Self-pay

## 2022-08-29 NOTE — Telephone Encounter (Signed)
Remote ICM transmission received.  Attempted call to patient regarding ICM remote transmission and left detailed message per DPR.  Advised to return call for any fluid symptoms or questions. Next ICM remote transmission scheduled 10/01/2022.

## 2022-08-29 NOTE — Progress Notes (Signed)
EPIC Encounter for ICM Monitoring  Patient Name: David Bright is a 62 y.o. male Date: 08/29/2022 Primary Care Physican: Stacie Glaze, DO Primary Cardiologist: Nelson/McLean   Electrophysiologist:  Caryl Comes 11/16/2021 Weight:  289 lbs 12/20/2021 Weight: 289 lbs 04/05/2022 Weight: 288 lbs           Attempted call to patient and unable to reach.  Left detailed message per DPR regarding transmission. Transmission reviewed.           Optivol thoracic impedance suggesting normal fluid levels.             Prescribed:             Furosemide 40 mg take 1.5 tablets (60 mg total) daily. Spironolactone 25 mg take 1 tablet daily   Labs: 07/05/2022 Creatinine 1.26, BUN 20, Potassium 3.9, Sodium 140, GFR 64 04/01/2022 Creatinine 1.16, BUN 25, Potassium 4.0, Sodium 137, GFR >60 12/27/2021 Creatinine 1.24, BUN 21, Potassium 3.8, Sodium 140, GFR >60  A complete set of results can be found in Results Review   Recommendations:   Left voice mail with ICM number and encouraged to call if experiencing any fluid symptoms.   Follow-up plan: ICM clinic phone appointment on 10/01/2022.   91 day device clinic remote transmission 09/05/2022.     EP/Cardiology Office Visits:  Recall 06/25/2022 with Dr Aundra Dubin.  08/16/2023 with Dr Curt Bears.   Copy of ICM check sent to Dr. Caryl Comes.    3 month ICM trend: 08/26/2022.    12-14 Month ICM trend:     [name], RN 08/29/2022 9:41 AM

## 2022-09-05 ENCOUNTER — Ambulatory Visit (INDEPENDENT_AMBULATORY_CARE_PROVIDER_SITE_OTHER): Payer: 59

## 2022-09-05 DIAGNOSIS — I428 Other cardiomyopathies: Secondary | ICD-10-CM

## 2022-09-05 LAB — CUP PACEART REMOTE DEVICE CHECK
Battery Remaining Longevity: 53 mo
Battery Voltage: 2.98 V
Brady Statistic RV Percent Paced: 0.01 %
Date Time Interrogation Session: 20240125033523
HighPow Impedance: 64 Ohm
Implantable Lead Connection Status: 753985
Implantable Lead Implant Date: 20160725
Implantable Lead Location: 753860
Implantable Lead Model: 293
Implantable Lead Serial Number: 383593
Implantable Pulse Generator Implant Date: 20160725
Lead Channel Impedance Value: 380 Ohm
Lead Channel Impedance Value: 380 Ohm
Lead Channel Pacing Threshold Amplitude: 0.875 V
Lead Channel Pacing Threshold Pulse Width: 0.4 ms
Lead Channel Sensing Intrinsic Amplitude: 12.625 mV
Lead Channel Sensing Intrinsic Amplitude: 12.625 mV
Lead Channel Setting Pacing Amplitude: 2.5 V
Lead Channel Setting Pacing Pulse Width: 0.4 ms
Lead Channel Setting Sensing Sensitivity: 0.3 mV
Zone Setting Status: 755011

## 2022-09-25 NOTE — Progress Notes (Signed)
Remote ICD transmission.   

## 2022-10-01 ENCOUNTER — Ambulatory Visit: Payer: 59

## 2022-10-01 DIAGNOSIS — Z95810 Presence of automatic (implantable) cardiac defibrillator: Secondary | ICD-10-CM

## 2022-10-01 DIAGNOSIS — I5022 Chronic systolic (congestive) heart failure: Secondary | ICD-10-CM | POA: Diagnosis not present

## 2022-10-04 NOTE — Progress Notes (Signed)
EPIC Encounter for ICM Monitoring  Patient Name: David Bright is a 62 y.o. male Date: 10/04/2022 Primary Care Physican: Stacie Glaze, DO Primary Cardiologist: Nelson/McLean   Electrophysiologist:  Caryl Comes 10/04/2022 Weight: 288 lbs           Spoke with patient and heart failure questions reviewed.  Transmission results reviewed.  Pt asymptomatic for fluid accumulation.  Reports feeling well at this time and voices no complaints.             Optivol thoracic impedance suggesting intermittent days with possible fluid accumulation within the last month.             Prescribed:             Furosemide 40 mg take 1.5 tablets (60 mg total) daily. Spironolactone 25 mg take 1 tablet daily   Labs: 07/05/2022 Creatinine 1.26, BUN 20, Potassium 3.9, Sodium 140, GFR 64 04/01/2022 Creatinine 1.16, BUN 25, Potassium 4.0, Sodium 137, GFR >60 12/27/2021 Creatinine 1.24, BUN 21, Potassium 3.8, Sodium 140, GFR >60  A complete set of results can be found in Results Review   Recommendations:  Recommendation to limit salt intake to 2000 mg daily and fluid intake to 64 oz daily.  Encouraged to call if experiencing any fluid symptoms.    Follow-up plan: ICM clinic phone appointment on 11/04/2022.   91 day device clinic remote transmission 12/05/2022.     EP/Cardiology Office Visits:  Advised to call Dr Oleh Genin office for overdue 6 month follow up.  Recall 06/25/2022 with Dr Aundra Dubin.  08/16/2023 with Dr Curt Bears.   Copy of ICM check sent to Dr. Caryl Comes.    3 month ICM trend: 10/01/2022.    12-14 Month ICM trend:     [name], RN 10/04/2022 2:40 PM

## 2022-11-04 ENCOUNTER — Ambulatory Visit: Payer: 59 | Attending: Internal Medicine

## 2022-11-04 DIAGNOSIS — Z95810 Presence of automatic (implantable) cardiac defibrillator: Secondary | ICD-10-CM | POA: Diagnosis not present

## 2022-11-04 DIAGNOSIS — I5022 Chronic systolic (congestive) heart failure: Secondary | ICD-10-CM

## 2022-11-08 NOTE — Progress Notes (Signed)
EPIC Encounter for ICM Monitoring  Patient Name: David Bright is a 62 y.o. male Date: 11/08/2022 Primary Care Physican: Stacie Glaze, DO Primary Cardiologist: Nelson/McLean   Electrophysiologist:  Caryl Comes 10/04/2022 Weight: 288 lbs           Spoke with patient and heart failure questions reviewed.  Transmission results reviewed.  Pt asymptomatic for fluid accumulation.  Reports feeling well at this time and voices no complaints.             Optivol thoracic impedance suggesting intermittent days with possible fluid accumulation within the last month.             Prescribed:             Furosemide 40 mg take 1.5 tablets (60 mg total) daily. Spironolactone 25 mg take 1 tablet daily   Labs: 07/05/2022 Creatinine 1.26, BUN 20, Potassium 3.9, Sodium 140, GFR 64 04/01/2022 Creatinine 1.16, BUN 25, Potassium 4.0, Sodium 137, GFR >60 12/27/2021 Creatinine 1.24, BUN 21, Potassium 3.8, Sodium 140, GFR >60  A complete set of results can be found in Results Review   Recommendations:  Recommendation to limit salt intake to 2000 mg daily and fluid intake to 64 oz daily.  Encouraged to call if experiencing any fluid symptoms.    Follow-up plan: ICM clinic phone appointment on 12/10/2022.   91 day device clinic remote transmission 12/05/2022.     EP/Cardiology Office Visits:  12/10/2022 with Dr Aundra Dubin.   Recall 08/16/2023 with Dr Curt Bears.   Copy of ICM check sent to Dr. Caryl Comes.     3 month ICM trend: 11/04/2022.    12-14 Month ICM trend:     [name], RN 11/08/2022 2:57 PM

## 2022-12-05 ENCOUNTER — Ambulatory Visit (INDEPENDENT_AMBULATORY_CARE_PROVIDER_SITE_OTHER): Payer: 59

## 2022-12-05 DIAGNOSIS — I428 Other cardiomyopathies: Secondary | ICD-10-CM | POA: Diagnosis not present

## 2022-12-05 LAB — CUP PACEART REMOTE DEVICE CHECK
Battery Remaining Longevity: 50 mo
Battery Voltage: 2.97 V
Brady Statistic RV Percent Paced: 0 %
Date Time Interrogation Session: 20240425074228
HighPow Impedance: 70 Ohm
Implantable Lead Connection Status: 753985
Implantable Lead Implant Date: 20160725
Implantable Lead Location: 753860
Implantable Lead Model: 293
Implantable Lead Serial Number: 383593
Implantable Pulse Generator Implant Date: 20160725
Lead Channel Impedance Value: 399 Ohm
Lead Channel Impedance Value: 399 Ohm
Lead Channel Pacing Threshold Amplitude: 0.875 V
Lead Channel Pacing Threshold Pulse Width: 0.4 ms
Lead Channel Sensing Intrinsic Amplitude: 15.25 mV
Lead Channel Sensing Intrinsic Amplitude: 15.25 mV
Lead Channel Setting Pacing Amplitude: 2.5 V
Lead Channel Setting Pacing Pulse Width: 0.4 ms
Lead Channel Setting Sensing Sensitivity: 0.3 mV
Zone Setting Status: 755011

## 2022-12-10 ENCOUNTER — Ambulatory Visit (HOSPITAL_COMMUNITY)
Admission: RE | Admit: 2022-12-10 | Discharge: 2022-12-10 | Disposition: A | Discharge status: Home / Self Care | Payer: 59 | Source: Ambulatory Visit | Attending: Internal Medicine | Admitting: Internal Medicine

## 2022-12-10 ENCOUNTER — Encounter (HOSPITAL_COMMUNITY): Payer: Self-pay | Admitting: Cardiology

## 2022-12-10 ENCOUNTER — Ambulatory Visit (INDEPENDENT_AMBULATORY_CARE_PROVIDER_SITE_OTHER): Payer: 59

## 2022-12-10 ENCOUNTER — Ambulatory Visit (HOSPITAL_BASED_OUTPATIENT_CLINIC_OR_DEPARTMENT_OTHER)
Admission: RE | Admit: 2022-12-10 | Discharge: 2022-12-10 | Disposition: A | Discharge status: Home / Self Care | Payer: 59 | Source: Ambulatory Visit | Attending: Cardiology | Admitting: Cardiology

## 2022-12-10 VITALS — BP 102/60 | HR 77 | Wt 277.6 lb

## 2022-12-10 DIAGNOSIS — Z79899 Other long term (current) drug therapy: Secondary | ICD-10-CM | POA: Diagnosis not present

## 2022-12-10 DIAGNOSIS — N183 Chronic kidney disease, stage 3 unspecified: Secondary | ICD-10-CM | POA: Insufficient documentation

## 2022-12-10 DIAGNOSIS — Z7982 Long term (current) use of aspirin: Secondary | ICD-10-CM | POA: Diagnosis not present

## 2022-12-10 DIAGNOSIS — I5042 Chronic combined systolic (congestive) and diastolic (congestive) heart failure: Secondary | ICD-10-CM

## 2022-12-10 DIAGNOSIS — I5022 Chronic systolic (congestive) heart failure: Secondary | ICD-10-CM | POA: Diagnosis not present

## 2022-12-10 DIAGNOSIS — G4733 Obstructive sleep apnea (adult) (pediatric): Secondary | ICD-10-CM | POA: Insufficient documentation

## 2022-12-10 DIAGNOSIS — Z8249 Family history of ischemic heart disease and other diseases of the circulatory system: Secondary | ICD-10-CM | POA: Insufficient documentation

## 2022-12-10 DIAGNOSIS — R0602 Shortness of breath: Secondary | ICD-10-CM | POA: Diagnosis not present

## 2022-12-10 DIAGNOSIS — F17210 Nicotine dependence, cigarettes, uncomplicated: Secondary | ICD-10-CM | POA: Insufficient documentation

## 2022-12-10 DIAGNOSIS — Z794 Long term (current) use of insulin: Secondary | ICD-10-CM | POA: Insufficient documentation

## 2022-12-10 DIAGNOSIS — E785 Hyperlipidemia, unspecified: Secondary | ICD-10-CM | POA: Insufficient documentation

## 2022-12-10 DIAGNOSIS — I428 Other cardiomyopathies: Secondary | ICD-10-CM | POA: Diagnosis not present

## 2022-12-10 DIAGNOSIS — E1142 Type 2 diabetes mellitus with diabetic polyneuropathy: Secondary | ICD-10-CM | POA: Insufficient documentation

## 2022-12-10 DIAGNOSIS — I739 Peripheral vascular disease, unspecified: Secondary | ICD-10-CM

## 2022-12-10 DIAGNOSIS — Z95810 Presence of automatic (implantable) cardiac defibrillator: Secondary | ICD-10-CM | POA: Diagnosis not present

## 2022-12-10 DIAGNOSIS — M25569 Pain in unspecified knee: Secondary | ICD-10-CM | POA: Insufficient documentation

## 2022-12-10 DIAGNOSIS — I130 Hypertensive heart and chronic kidney disease with heart failure and stage 1 through stage 4 chronic kidney disease, or unspecified chronic kidney disease: Secondary | ICD-10-CM | POA: Diagnosis not present

## 2022-12-10 LAB — BASIC METABOLIC PANEL
Anion gap: 9 (ref 5–15)
BUN: 18 mg/dL (ref 8–23)
CO2: 28 mmol/L (ref 22–32)
Calcium: 9.1 mg/dL (ref 8.9–10.3)
Chloride: 103 mmol/L (ref 98–111)
Creatinine, Ser: 1.12 mg/dL (ref 0.61–1.24)
GFR, Estimated: >60 mL/min (ref >60)
Glucose, Bld: 161 mg/dL — ABNORMAL HIGH (ref 70–99)
Potassium: 3.4 mmol/L — ABNORMAL LOW (ref 3.5–5.1)
Sodium: 140 mmol/L (ref 135–145)

## 2022-12-10 LAB — LIPID PANEL
Cholesterol: 146 mg/dL (ref 0–200)
HDL: 39 mg/dL — ABNORMAL LOW (ref >40)
LDL Cholesterol: 90 mg/dL (ref 0–99)
Total CHOL/HDL Ratio: 3.7 RATIO
Triglycerides: 83 mg/dL (ref <150)
VLDL: 17 mg/dL (ref 0–40)

## 2022-12-10 LAB — ECHOCARDIOGRAM COMPLETE
Area-P 1/2: 8.62 cm2
Calc EF: 41.7 %
S' Lateral: 4.9 cm
Single Plane A2C EF: 39.4 %
Single Plane A4C EF: 41.4 %

## 2022-12-10 LAB — BRAIN NATRIURETIC PEPTIDE: B Natriuretic Peptide: 28.7 pg/mL (ref 0.0–100.0)

## 2022-12-10 MED ORDER — VARENICLINE TARTRATE (STARTER) 0.5 MG X 11 & 1 MG X 42 PO TBPK: ORAL_TABLET | ORAL | 1 refills | Status: DC

## 2022-12-10 MED ORDER — FUROSEMIDE 40 MG PO TABS: 40.0000 mg | ORAL_TABLET | Freq: Every day | ORAL | 3 refills | Status: DC

## 2022-12-10 MED ORDER — CARVEDILOL 6.25 MG PO TABS: 9.7500 mg | ORAL_TABLET | Freq: Two times a day (BID) | ORAL | 3 refills | Status: DC

## 2022-12-10 NOTE — Patient Instructions (Addendum)
START Chantix as directed on package.  INCREASE Carvedilol to 9.375 mg ( 1 1/2 ) Twice daily  CHANGE Lasix to 40 mg daily.  Labs done today, your results will be available in MyChart, we will contact you for abnormal readings.  Your provider has ordered scan of your legs. You will be called to have the test arranged.  Please follow up with our heart failure pharmacist in 3 weeks  Your physician recommends that you schedule a follow-up appointment in: 4 months (September) ** please call the office in July to arrange your follow up appointment. **  If you have any questions or concerns before your next appointment please send [place] a message through [place] or call our office at [phone].    TO LEAVE A MESSAGE FOR THE NURSE SELECT OPTION 2, PLEASE LEAVE A MESSAGE INCLUDING: YOUR NAME DATE OF BIRTH CALL BACK NUMBER REASON FOR CALL**this is important as we prioritize the call backs  YOU WILL RECEIVE A CALL BACK THE SAME DAY AS LONG AS YOU CALL BEFORE 4:00 PM  At the [facility], you and your health needs are our priority. As part of our continuing mission to provide you with exceptional heart care, we have created designated Provider Care Teams. These Care Teams include your primary Cardiologist (physician) and Advanced Practice Providers (APPs- Physician Assistants and Nurse Practitioners) who all work together to provide you with the care you need, when you need it.   You may see any of the following providers on your designated Care Team at your next follow up: Dr Arvilla Meres Dr Marca Ancona Dr. Marcos Eke, NP Robbie Lis, [place] [facility] [place], [place] Brynda Peon, NP Karle Plumber, PharmD   Please be sure to bring in all your medications bottles to every appointment.    Thank you for choosing [facility]

## 2022-12-10 NOTE — Progress Notes (Signed)
ID:  David Bright, DOB 16-Aug-1960, MRN [id number]   Provider location: [facility]  PCP:  Patrcia Dolly, DO  HF Cardiology: Dr. Shirlee Latch   History of Present Illness: David Bright is a 62 y.o. male who has a history of chronic systolic CHF and COPD/active smoking.  He has a known nonischemic cardiomyopathy, echo in 1/17 with EF 20% and a severely dilated LV.  No coronary disease on 7/16 cardiac cath.  Repeat echo 2/18, showing EF up to 40%.   He had significant PAD by peripheral arterial dopplers, saw Dr. Allyson Sabal with plan of medical management for now. He also has knee pain which is limiting.   Echo 09/2017: EF up to 50-55% with mild LVH. Echo in 1/20 showed EF 55-60%.  Echo in 3/21 showed EF 55-60% with normal RV.  Echo in 11/22 with EF 60-65%, moderate LVH, normal RV.   Echo was done today and reviewed, EF 45-50%, mild RV dysfunction, normal IVC.   He returns for followup of CHF.  Weight is down about 12 lbs.  He is short of breath walking up stairs and hills, no dyspnea walking on flat ground.  No chest pain.  No claudication though his legs feel weak after her walks a long distance.  No orthopnea/PND.  No palpitations.  Still smoking about 1 ppd. Using CPAP.   Medtronic device interrogation: Fluid index < threshold, no AF/VT.    ECG (personally reviewed): NSR, normal  Labs (12/17): K 4.3, creatinine 1.09 Labs (1/18): K 3.9, creatinine 1.13 Labs (3/18): K 4.3, creatinine 1.48, NT-proBNP 61 Labs (4/18): K 4.5, creatinine 1.23, LDL 71, BNP 34 Labs (5/18): K 4.5, creatinine 1.2 Labs (1/19): K 4.2, creatinine 1.19, LDL 69, HDL 39 Labs (11/19): LDL 66, HDL 34, K 3.3, creatinine 4.09, BNP 45 Labs (12/19): K 4.4, creatinine 1.59 Labs (8/20): K 4.5, creatinine 1.66 Labs (2/21): K 4.5, creatinine 1.63 Labs (3/21): LDL 64, HDL 38 Labs (6/21): K 4.2, creatinine 1.24 Labs (10/21): K 4.1, creatinine 1.43 Labs (1/23): K 3.9, creatinine 1.28 Labs (11/23): K 3.9,  creatinine 1.26, hgb 15.7  PMH:  1. Chronic systolic CHF: Nonischemic cardiomyopathy.  Has Medtronic ICD.   - LHC (7/16) with nonobstructive disease.  - Echo (1/17) with EF 20%, severe LV dilation, mild to moderate MR.  - Echo (2/18): EF 40%, mild LV dilation.  - Echo (5/19): EF 50-55%, mild LVH - Echo (1/20): EF 55-60%, mild LVH, normal RV size and systolic function.  - Echo (3/20): EF 50-55%, RV normal, IVC normal - Echo (3/21): EF 55-60%, mild LVH - Echo (11/22): EF 60-65%, moderate LVH, normal RV.  - Echo (4/24): EF 45-50%, mild RV dysfunction, normal IVC.  2. COPD: Active smoker.  - PFTs (2/18) with minimal obstruction, mild restriction suggesting a parenchymal process, low DLCO.  - CT chest (6/18): No ILD, +emphysema, lung nodules.  - CT chest (11/19): Emphysema, no ILD, stable nodules 3. Type II diabetes with peripheral neuropathy.  4. OSA on CPAP 5. HTN 6. Hyperlipidemia 7. PAD: ABIs abnormal at outside office.  - Peripheral arterial dopplers (3/18) with > 50% left CIA and bilateral EIA stenosis, 50-74% mid LSFA stenosis.  - ABIs (2/19): 0.85 right, 0.86 left (mild).  - ABIs (5/20): 0.89 right, 0.86 left (mild).  - ABIs (5/21): 0.9 right, 0.84 left (mild) - ABIs (11/22): 0.9 right, 0.84 left (mild) 8. CKD: Stage 3.  9. B12 deficiency.  10. COVID-19 7/22  SH: Lives in [place], works at [employer], smokes 1 ppd.  Rare ETOH.  Remote substance abuse.   FH: Aunt with CHF, brother with ESRD.    Review of systems complete and found to be negative unless listed in HPI.   Current Outpatient Medications  Medication Sig Dispense Refill   aspirin EC 81 MG tablet Take 1 tablet (81 mg total) by mouth daily. 30 tablet 0   atorvastatin (LIPITOR) 20 MG tablet Take 1 tablet (20 mg total) by mouth daily. 30 tablet 3   dapagliflozin propanediol (FARXIGA) 10 MG TABS tablet Take 1 tablet (10 mg total) by mouth daily before breakfast. 30 tablet 6   ENTRESTO 97-103 MG Take 1 tablet  by mouth twice daily 180 tablet 3   insulin glargine (LANTUS) 100 UNIT/ML injection Inject 20 Units into the skin at bedtime.     OZEMPIC, 1 MG/DOSE, 4 MG/3ML SOPN Inject 1 mg into the skin once a week.     sildenafil (VIAGRA) 100 MG tablet SMARTSIG:1 Tablet(s) By Mouth Every 3 Days PRN     spironolactone (ALDACTONE) 25 MG tablet Take 1 tablet by mouth once daily 90 tablet 3   Varenicline Tartrate, Starter, (CHANTIX STARTING MONTH PAK) 0.5 MG X 11 & 1 MG X 42 TBPK Day 1-3 .5mg  daily, Day 4-7 .5mg  Twice daily, Day 8 onwards 1 mg Twice daily 53 each 1   vitamin B-12 (CYANOCOBALAMIN) 500 MCG tablet Take 500 mcg by mouth daily.     Vitamin D, Ergocalciferol, (DRISDOL) 1.25 MG (50000 UNIT) CAPS capsule Take 50,000 Units by mouth once a week.     carvedilol (COREG) 6.25 MG tablet Take 1.5 tablets (9.375 mg total) by mouth 2 (two) times daily with a meal. 180 tablet 3   furosemide (LASIX) 40 MG tablet Take 1 tablet (40 mg total) by mouth daily. 90 tablet 3   No current facility-administered medications for this encounter.   Exam:   BP 102/60   Pulse 77   Wt 125.9 kg (277 lb 9.6 oz)   SpO2 98%   BMI 37.65 kg/m  General: NAD Neck: No JVD, no thyromegaly or thyroid nodule.  Lungs: Clear to auscultation bilaterally with normal respiratory effort. CV: Nondisplaced PMI.  Heart regular S1/S2, no S3/S4, no murmur.  No peripheral edema.  No carotid bruit.  Difficult to palpate pedal pulses.  Abdomen: Soft, nontender, no hepatosplenomegaly, no distention.  Skin: Intact without lesions or rashes.  Neurologic: Alert and oriented x 3.  Psych: Normal affect. Extremities: No clubbing or cyanosis.  HEENT: Normal.   Assessment/Plan: 1. Chronic systolic CHF: Nonischemic cardiomyopathy => ?due to viral myocarditis or prior substance abuse.  Medtronic ICD.  Echo 1/17 with severely dilated LV and EF 20%, EF up to 40% on echo 2/18.  Echo in 1/20 showed EF up to 55% with normal RV, and echo in 3/21 and again in  11/22 showed EF 60%.  However, echo today showd mildly decreased EF 45-50%.  NYHA class II symptoms.  He is not volume overloaded by exam or Optivol.  - Continue Lasix 40 mg daily, BMET/BNP today.    - Continue spironolactone 25 mg daily, Entresto 97/103 bid, dapagliflozin 10 daily.  - Increase Coreg to 9.375 mg bid. If BP tolerates this, increase to 12.5 mg bid.  2. Smoking/suspect COPD:  PFTs done, somewhat concerning for interstitial lung disease rather than emphysema with restrictive PFTs and decreased DLCO => no ILD on CT, emphysema noted along with lung nodules. Most recent repeat  CT in 11/19 showed stable nodules, do not need repeat. He continues to smoke about 1 pack/day.  Failed Wellbutrin and Chantix in past but thinks Chantix did help.   - I encouraged him again to quit smoking.  - He wants to try Chantix again, I will give a prescription.  3. OSA: Continue CPAP. No change.  4. PAD: Significant disease on peripheral dopplers. He saw Dr. Allyson Sabal, medical management recommended.  ABIs in 11/22 mildly reduced.  He has leg "weakness" when he walks a long distance.  - Continue ASA 81 and statin.  Check lipids today.  - I will arrange for ABIs.   Followup 3 wks with HF pharmacist to increase Coreg if BP is tolerating 9.375 mg bid.  Followup in 4 months with APP.    Signed, Marca Ancona, MD  12/10/2022  [facility] [address] [facility] [place] [address] [phone] (office) [phone] (fax)

## 2022-12-13 NOTE — Progress Notes (Signed)
EPIC Encounter for ICM Monitoring  Patient Name: David Bright is a 62 y.o. male Date: 12/13/2022 Primary Care Physican: Patrcia Dolly, DO Primary Cardiologist: Nelson/McLean   Electrophysiologist:  Graciela Husbands 10/04/2022 Weight: 288 lbs 12/10/2022 Weight: 277 lbs           Transmission results reviewed.              Optivol thoracic impedance suggesting normal fluid levels within the last month.             Prescribed:             Furosemide 40 mg take 1 tablets (40 mg total) daily. Spironolactone 25 mg take 1 tablet daily   Labs: 12/10/2022 Creatinine 1.12, BUN 18, Potassium 3.4, Sodium 140, GFR>60 A complete set of results can be found in Results Review   Recommendations:  No changes.  Any recommendations given at 4/30 HF clinic OV.    Follow-up plan: ICM clinic phone appointment on 01/13/2023.   91 day device clinic remote transmission 03/06/2023.     EP/Cardiology Office Visits:  Recall 04/11/2023 with Dr Shirlee Latch.   Recall 08/16/2023 with Dr Elberta Fortis.   Copy of ICM check sent to Dr. Graciela Husbands.      3 month ICM trend: 12/10/2022.    12-14 Month ICM trend:     [name], RN 12/13/2022 2:09 PM

## 2022-12-16 ENCOUNTER — Telehealth (HOSPITAL_COMMUNITY): Payer: Self-pay | Admitting: *Deleted

## 2022-12-16 DIAGNOSIS — I5042 Chronic combined systolic (congestive) and diastolic (congestive) heart failure: Secondary | ICD-10-CM

## 2022-12-16 DIAGNOSIS — E785 Hyperlipidemia, unspecified: Secondary | ICD-10-CM

## 2022-12-16 MED ORDER — ATORVASTATIN CALCIUM 40 MG PO TABS
40.0000 mg | ORAL_TABLET | Freq: Every day | ORAL | 3 refills | Status: DC
Start: 2022-12-16 — End: 2023-12-08

## 2022-12-16 NOTE — Telephone Encounter (Signed)
Called patient per Dr. Shirlee Latch with following lab results and instructions:  "Goal LDL < 55 with PAD, increase atorvastatin to 40 mg daily with lipids/LFTs in 2 months."  Pt verbalized understanding of same. Updated Rx sent to requested pharmacy. Repeat labs ordered and scheduled.

## 2022-12-18 ENCOUNTER — Other Ambulatory Visit (HOSPITAL_COMMUNITY): Payer: Self-pay | Admitting: Cardiology

## 2022-12-18 DIAGNOSIS — I739 Peripheral vascular disease, unspecified: Secondary | ICD-10-CM

## 2022-12-26 ENCOUNTER — Ambulatory Visit (HOSPITAL_COMMUNITY)
Admission: RE | Admit: 2022-12-26 | Discharge: 2022-12-26 | Disposition: A | Discharge status: Home / Self Care | Payer: 59 | Source: Ambulatory Visit | Attending: Internal Medicine | Admitting: Internal Medicine

## 2022-12-26 DIAGNOSIS — I739 Peripheral vascular disease, unspecified: Secondary | ICD-10-CM

## 2022-12-26 LAB — VAS US ABI WITH/WO TBI: Right ABI: 0.86

## 2022-12-27 LAB — VAS US ABI WITH/WO TBI: Left ABI: 0.81

## 2022-12-31 ENCOUNTER — Ambulatory Visit (HOSPITAL_COMMUNITY)
Admission: RE | Admit: 2022-12-31 | Discharge: 2022-12-31 | Disposition: A | Discharge status: Home / Self Care | Payer: 59 | Source: Ambulatory Visit | Attending: Cardiology | Admitting: Cardiology

## 2022-12-31 VITALS — BP 98/72 | HR 77 | Wt 274.2 lb

## 2022-12-31 DIAGNOSIS — I110 Hypertensive heart disease with heart failure: Secondary | ICD-10-CM | POA: Diagnosis not present

## 2022-12-31 DIAGNOSIS — I5022 Chronic systolic (congestive) heart failure: Secondary | ICD-10-CM | POA: Diagnosis present

## 2022-12-31 DIAGNOSIS — I5042 Chronic combined systolic (congestive) and diastolic (congestive) heart failure: Secondary | ICD-10-CM | POA: Diagnosis not present

## 2022-12-31 DIAGNOSIS — I739 Peripheral vascular disease, unspecified: Secondary | ICD-10-CM | POA: Insufficient documentation

## 2022-12-31 DIAGNOSIS — F17210 Nicotine dependence, cigarettes, uncomplicated: Secondary | ICD-10-CM | POA: Diagnosis not present

## 2022-12-31 DIAGNOSIS — I428 Other cardiomyopathies: Secondary | ICD-10-CM | POA: Diagnosis not present

## 2022-12-31 DIAGNOSIS — G4733 Obstructive sleep apnea (adult) (pediatric): Secondary | ICD-10-CM | POA: Diagnosis not present

## 2022-12-31 MED ORDER — CARVEDILOL 12.5 MG PO TABS: 12.5000 mg | ORAL_TABLET | Freq: Two times a day (BID) | ORAL | 3 refills | Status: DC

## 2022-12-31 NOTE — Patient Instructions (Signed)
It was a pleasure seeing you today!  MEDICATIONS: -We are changing your medications today -Increase carvedilol to 12.5 mg (1 tablet) twice daily. You may take 2 tablets of the 6.25 mg strength twice daily until you pick up the next strength.  -Call if you have questions about your medications.  LABS: -We will call you if your labs need attention.  NEXT APPOINTMENT: Return to clinic in 3 months with APP Clinic.  In general, to take care of your heart failure: -Limit your fluid intake to 2 Liters (half-gallon) per day.   -Limit your salt intake to ideally 2-3 grams (2000-3000 mg) per day. -Weigh yourself daily and record, and bring that "weight diary" to your next appointment.  (Weight gain of 2-3 pounds in 1 day typically means fluid weight.) -The medications for your heart are to help your heart and help you live longer.   -Please contact us before stopping any of your heart medications.  Call the clinic at [phone] with questions or to reschedule future appointments.

## 2022-12-31 NOTE — Progress Notes (Signed)
Advanced [facility] Failure Clinic Note    PCP:  Patrcia Dolly, DO          HF Cardiology: Dr. Shirlee Latch  HPI:  David Bright is a 62 y.o. male who has a history of chronic systolic CHF and COPD/active smoking.  He has a known nonischemic cardiomyopathy, echo in 08/2015 with EF 20% and a severely dilated LV.  No coronary disease on 02/2015 cardiac cath.  Repeat echo 09/2016, showing EF up to 40%.    He had significant PAD by peripheral arterial dopplers, saw Dr. Allyson Sabal with plan of medical management for now. He also has knee pain which is limiting.    Echo 09/2017: EF up to 50-55% with mild LVH. Echo in 08/2018 showed EF 55-60%.  Echo in 10/2019 showed EF 55-60% with normal RV.  Echo in 06/2021 with EF 60-65%, moderate LVH, normal RV.    Echo was done on 12/10/2022 and showed an EF 45-50%, mild RV dysfunction, normal IVC.    On 12/10/2022 he returned for followup of CHF.  Weight was down about 12 lbs.  He was short of breath walking up stairs and hills, no dyspnea walking on flat ground.  No chest pain.  No claudication though his legs feel weak after he walks a long distance.  No orthopnea/PND.  No palpitations.  Still smoking about 1 ppd. Using CPAP.  Today he returns to HF clinic for pharmacist medication titration. At last visit with MD, carvedilol was increased to 9.375 mg BID and Chantix was restarted. Patient is overall feeling "not too bad". Patient reported every now and then his head feels "funny", but this does not occur everyday. The incidents last less than one minute. Patient stated the incidents were occurring before the increase in carvedilol dose.  Patient was counseled on taking BP at home when feeling funny. No dizziness or lightheadedness.  Patient stated he always feels tired, some days he feels like he doesn't want to do anything. No CP or palpitations reported. Reported some SOB when going upstairs and has to stop halfway up. Patient stated he has to stop periodically when  cleaning his house. Patient does not record his weight at home. No extra doses of Lasix have been needed. No LEE, PND or orthopnea. Appetite is good. Patient stated he is trying to stay off salt and eat a healthier diet.   HF Medications: Carvedilol 9.375 mg BID Entresto 97/103 mg BID Spironolactone 25 mg daily Farxiga 10 mg daily Lasix 40 mg daily  Has the patient been experiencing any side effects to the medications prescribed?  No  Does the patient have any problems obtaining medications due to transportation or finances?   No, [employer] of regimen: good Understanding of indications: good Potential of compliance: good Patient understands to avoid NSAIDs. Patient understands to avoid decongestants.   Pertinent Lab Values: 12/10/2022: Serum creatinine 1.12, BUN 18, Potassium 3.4, Sodium 140, BNP 28.7  Vital Signs: Weight: 274.2 lbs (last clinic weight: 277.6 lbs) Blood pressure: 98/72 mmHg  Heart rate: 77 bpm   Assessment/Plan: 1. Chronic systolic CHF: Nonischemic cardiomyopathy => ? due to viral myocarditis or prior substance abuse.  Medtronic ICD.  Echo 08/2015 with severely dilated LV and EF 20%, EF up to 40% on echo 09/2016.  Echo in 08/2018 showed EF up to 55% with normal RV, and echo in 10/2019 and again in 06/2021 showed EF 60%.  However, echo on 12/10/2022 showed mildly decreased EF 45-50%.   -  NYHA class II symptoms. Euvolemic on exam today. - Continue Lasix 40 mg daily   - Increase carvedilol to 12.5 mg BID.  - Continue Entresto 97/103 mg BID - Continue spironolactone 25 mg daily  - Continue Farxiga 10 daily.   2. Smoking/suspect COPD:  PFTs done, somewhat concerning for interstitial lung disease rather than emphysema with restrictive PFTs and decreased DLCO => no ILD on CT, emphysema noted along with lung nodules. Most recent repeat CT in 06/2018 showed stable nodules, do not need repeat. Had failed Wellbutrin and Chantix in past but thinks  Chantix did help.    - Continue Chantix 1 mg BID   3. OSA: Continue CPAP. No change.   4. PAD: Significant disease on peripheral dopplers. He saw Dr. Allyson Sabal, medical management recommended.  ABIs in 06/2021 and 12/2022 mildly reduced.  He has leg "weakness" when he walks a long distance.  - Continue ASA 81 and statin.   Follow up on 03/31/2023 with [facility]  Karle Plumber, PharmD, BCPS, BCCP, CPP Heart Failure Clinic Pharmacist [phone]

## 2022-12-31 NOTE — Progress Notes (Signed)
Remote ICD transmission.   

## 2023-01-13 ENCOUNTER — Ambulatory Visit: Payer: 59 | Attending: Internal Medicine

## 2023-01-13 DIAGNOSIS — I5042 Chronic combined systolic (congestive) and diastolic (congestive) heart failure: Secondary | ICD-10-CM

## 2023-01-13 DIAGNOSIS — Z95810 Presence of automatic (implantable) cardiac defibrillator: Secondary | ICD-10-CM | POA: Diagnosis not present

## 2023-01-14 ENCOUNTER — Other Ambulatory Visit (HOSPITAL_COMMUNITY): Payer: Self-pay | Admitting: Cardiology

## 2023-01-16 NOTE — Progress Notes (Signed)
EPIC Encounter for ICM Monitoring  Patient Name: David Bright is a 62 y.o. male Date: 01/16/2023 Primary Care Physican: Patrcia Dolly, DO Primary Cardiologist: Nelson/McLean   Electrophysiologist:  Graciela Husbands 10/04/2022 Weight: 288 lbs 12/10/2022 Weight: 277 lbs           Spoke with patient and heart failure questions reviewed.  Transmission results reviewed.  Pt asymptomatic for fluid accumulation.  Reports feeling well at this time and voices no complaints.               Optivol thoracic impedance suggesting normal fluid levels within the last month.             Prescribed:             Furosemide 40 mg take 1 tablets (40 mg total) daily. Spironolactone 25 mg take 1 tablet daily   Labs: 12/10/2022 Creatinine 1.12, BUN 18, Potassium 3.4, Sodium 140, GFR>60 A complete set of results can be found in Results Review   Recommendations:  No changes and encouraged to call if experiencing any fluid symptoms.   Follow-up plan: ICM clinic phone appointment on 02/17/2023.   91 day device clinic remote transmission 03/06/2023.     EP/Cardiology Office Visits:  Recall 04/11/2023 with Dr Shirlee Latch.   Recall 08/16/2023 with Dr Elberta Fortis.   Copy of ICM check sent to Dr. Graciela Husbands.      3 month ICM trend: 01/13/2023.    12-14 Month ICM trend:     [name], RN 01/16/2023 8:33 AM

## 2023-02-02 ENCOUNTER — Other Ambulatory Visit (HOSPITAL_COMMUNITY): Payer: Self-pay | Admitting: Cardiology

## 2023-02-17 ENCOUNTER — Ambulatory Visit: Payer: 59 | Attending: Internal Medicine

## 2023-02-17 DIAGNOSIS — Z95810 Presence of automatic (implantable) cardiac defibrillator: Secondary | ICD-10-CM

## 2023-02-17 DIAGNOSIS — I5042 Chronic combined systolic (congestive) and diastolic (congestive) heart failure: Secondary | ICD-10-CM | POA: Diagnosis not present

## 2023-02-21 NOTE — Progress Notes (Signed)
EPIC Encounter for ICM Monitoring  Patient Name: David Bright is a 62 y.o. male Date: 02/21/2023 Primary Care Physican: Patrcia Dolly, DO Primary Cardiologist: Nelson/McLean   Electrophysiologist:  Graciela Husbands 10/04/2022 Weight: 288 lbs 12/10/2022 Weight: 277 lbs 02/21/2023 Weight: 275 lbs           Spoke with patient and heart failure questions reviewed.  Transmission results reviewed.  Pt asymptomatic for fluid accumulation.  Reports feeling well at this time and voices no complaints.               Optivol thoracic impedance suggesting intermittent days with possible fluid accumulation within the last month.             Prescribed:             Furosemide 40 mg take 1 tablets (40 mg total) daily. Spironolactone 25 mg take 1 tablet daily   Labs: 12/10/2022 Creatinine 1.12, BUN 18, Potassium 3.4, Sodium 140, GFR>60 A complete set of results can be found in Results Review   Recommendations:  No changes and encouraged to call if experiencing any fluid symptoms.   Follow-up plan: ICM clinic phone appointment on 03/24/2023.   91 day device clinic remote transmission 06/05/2023.     EP/Cardiology Office Visits:  Recall 04/11/2023 with Dr Shirlee Latch.   Recall 08/16/2023 with Dr Elberta Fortis.   Copy of ICM check sent to Dr. Graciela Husbands.      3 month ICM trend: 02/17/2023.    12-14 Month ICM trend:     [name], RN 02/21/2023 4:37 PM

## 2023-02-24 ENCOUNTER — Ambulatory Visit (HOSPITAL_COMMUNITY)
Admission: RE | Admit: 2023-02-24 | Discharge: 2023-02-24 | Disposition: A | Discharge status: Home / Self Care | Payer: 59 | Source: Ambulatory Visit | Attending: Cardiology | Admitting: Cardiology

## 2023-02-24 DIAGNOSIS — I5042 Chronic combined systolic (congestive) and diastolic (congestive) heart failure: Secondary | ICD-10-CM | POA: Diagnosis present

## 2023-02-24 DIAGNOSIS — E785 Hyperlipidemia, unspecified: Secondary | ICD-10-CM | POA: Insufficient documentation

## 2023-02-24 LAB — LIPID PANEL
Cholesterol: 123 mg/dL (ref 0–200)
HDL: 30 mg/dL — ABNORMAL LOW (ref >40)
LDL Cholesterol: 67 mg/dL (ref 0–99)
Total CHOL/HDL Ratio: 4.1 RATIO
Triglycerides: 130 mg/dL (ref <150)
VLDL: 26 mg/dL (ref 0–40)

## 2023-02-24 LAB — HEPATIC FUNCTION PANEL
ALT: 16 U/L (ref 0–44)
AST: 17 U/L (ref 15–41)
Albumin: 3.5 g/dL (ref 3.5–5.0)
Alkaline Phosphatase: 85 U/L (ref 38–126)
Bilirubin, Direct: 0.2 mg/dL (ref 0.0–0.2)
Indirect Bilirubin: 0.3 mg/dL (ref 0.3–0.9)
Total Bilirubin: 0.5 mg/dL (ref 0.3–1.2)
Total Protein: 6.9 g/dL (ref 6.5–8.1)

## 2023-03-02 ENCOUNTER — Other Ambulatory Visit (HOSPITAL_COMMUNITY): Payer: Self-pay | Admitting: Cardiology

## 2023-03-06 ENCOUNTER — Ambulatory Visit: Payer: 59

## 2023-03-06 DIAGNOSIS — I428 Other cardiomyopathies: Secondary | ICD-10-CM | POA: Diagnosis not present

## 2023-03-06 LAB — CUP PACEART REMOTE DEVICE CHECK
Battery Remaining Longevity: 43 mo
Battery Voltage: 2.98 V
Brady Statistic RV Percent Paced: 0.01 %
Date Time Interrogation Session: 20240725043723
HighPow Impedance: 65 Ohm
Implantable Lead Connection Status: 753985
Implantable Lead Implant Date: 20160725
Implantable Lead Location: 753860
Implantable Lead Model: 293
Implantable Lead Serial Number: 383593
Implantable Pulse Generator Implant Date: 20160725
Lead Channel Impedance Value: 380 Ohm
Lead Channel Impedance Value: 380 Ohm
Lead Channel Pacing Threshold Amplitude: 0.875 V
Lead Channel Pacing Threshold Pulse Width: 0.4 ms
Lead Channel Sensing Intrinsic Amplitude: 12.375 mV
Lead Channel Sensing Intrinsic Amplitude: 12.375 mV
Lead Channel Setting Pacing Amplitude: 2.5 V
Lead Channel Setting Pacing Pulse Width: 0.4 ms
Lead Channel Setting Sensing Sensitivity: 0.3 mV
Zone Setting Status: 755011

## 2023-03-18 NOTE — Progress Notes (Signed)
Remote ICD transmission.   

## 2023-03-24 ENCOUNTER — Ambulatory Visit: Payer: 59 | Attending: Internal Medicine

## 2023-03-24 DIAGNOSIS — I5042 Chronic combined systolic (congestive) and diastolic (congestive) heart failure: Secondary | ICD-10-CM | POA: Diagnosis not present

## 2023-03-24 DIAGNOSIS — Z95810 Presence of automatic (implantable) cardiac defibrillator: Secondary | ICD-10-CM

## 2023-03-26 NOTE — Progress Notes (Signed)
EPIC Encounter for ICM Monitoring  Patient Name: David Bright is a 62 y.o. male Date: 03/26/2023 Primary Care Physican: Patrcia Dolly, DO Primary Cardiologist: Nelson/McLean   Electrophysiologist:  Graciela Husbands 10/04/2022 Weight: 288 lbs 12/10/2022 Weight: 277 lbs 02/21/2023 Weight: 275 lbs           Spoke with patient and heart failure questions reviewed.  Transmission results reviewed.  Pt asymptomatic for fluid accumulation.  Pt reports eating foods high in salt after having Endoscopy on 7/30.   He is currently on vacation for a few days           Optivol thoracic impedance suggesting possible fluid accumulation starting 7/31.             Prescribed:             Furosemide 40 mg take 1.5 tablets (60 mg total) by mouth daily. Spironolactone 25 mg take 1 tablet daily   Labs: 12/10/2022 Creatinine 1.12, BUN 18, Potassium 3.4, Sodium 140, GFR>60 A complete set of results can be found in Results Review   Recommendations:  Advised to take extra 20 mg of Furosemide x 1 day after returns from vacation and Dr Shirlee Latch will check fluid levels at 8/19 OV.   Follow-up plan: ICM clinic phone appointment on 04/07/2023 to recheck fluid levels.   91 day device clinic remote transmission 06/05/2023.     EP/Cardiology Office Visits:  03/31/2023 with Dr Shirlee Latch.   Recall 08/16/2023 with Dr Elberta Fortis.   Copy of ICM check sent to Dr. Graciela Husbands.      3 month ICM trend: 03/24/2023.    12-14 Month ICM trend:     [name], RN 03/26/2023 10:00 AM

## 2023-03-31 ENCOUNTER — Encounter (HOSPITAL_COMMUNITY): Payer: 59

## 2023-04-07 ENCOUNTER — Ambulatory Visit: Payer: 59 | Attending: Internal Medicine

## 2023-04-07 DIAGNOSIS — Z95810 Presence of automatic (implantable) cardiac defibrillator: Secondary | ICD-10-CM

## 2023-04-07 DIAGNOSIS — I5042 Chronic combined systolic (congestive) and diastolic (congestive) heart failure: Secondary | ICD-10-CM

## 2023-04-09 NOTE — Progress Notes (Signed)
EPIC Encounter for ICM Monitoring  Patient Name: David Bright is a 62 y.o. male Date: 04/09/2023 Primary Care Physican: Patrcia Dolly, DO Primary Cardiologist: Nelson/McLean   Electrophysiologist:  Graciela Husbands 10/04/2022 Weight: 288 lbs 12/10/2022 Weight: 277 lbs 02/21/2023 Weight: 275 lbs 04/09/2023 Weight: 275 lbs           Spoke with patient and heart failure questions reviewed.  Transmission results reviewed.  Pt asymptomatic for fluid accumulation.          Optivol thoracic impedance suggesting fluid levels returned to normal.             Prescribed:             Furosemide 40 mg take 1.5 tablets (60 mg total) by mouth daily. Spironolactone 25 mg take 1 tablet daily   Labs: 12/10/2022 Creatinine 1.12, BUN 18, Potassium 3.4, Sodium 140, GFR>60 A complete set of results can be found in Results Review   Recommendations:  No changes and encouraged to call if experiencing any fluid symptoms.   Follow-up plan: ICM clinic phone appointment on 04/28/2023.   91 day device clinic remote transmission 06/05/2023.     EP/Cardiology Office Visits:  Missed 03/31/2023 with Dr Shirlee Latch and advised to reschedule.   Recall 08/16/2023 with Dr Elberta Fortis.   Copy of ICM check sent to Dr. Graciela Husbands.       3 month ICM trend: 04/07/2023.    12-14 Month ICM trend:     [name], RN 04/09/2023 2:59 PM

## 2023-04-28 ENCOUNTER — Ambulatory Visit: Payer: 59 | Attending: Internal Medicine

## 2023-04-28 DIAGNOSIS — Z95810 Presence of automatic (implantable) cardiac defibrillator: Secondary | ICD-10-CM | POA: Diagnosis not present

## 2023-04-28 DIAGNOSIS — I5042 Chronic combined systolic (congestive) and diastolic (congestive) heart failure: Secondary | ICD-10-CM | POA: Diagnosis not present

## 2023-04-29 NOTE — Progress Notes (Signed)
EPIC Encounter for ICM Monitoring  Patient Name: David Bright is a 62 y.o. male Date: 04/29/2023 Primary Care Physican: Patrcia Dolly, DO Primary Cardiologist: Nelson/McLean   Electrophysiologist:  Graciela Husbands 10/04/2022 Weight: 288 lbs 12/10/2022 Weight: 277 lbs 02/21/2023 Weight: 275 lbs 04/09/2023 Weight: 275 lbs           Spoke with patient and heart failure questions reviewed.  Transmission results reviewed.  Pt asymptomatic for fluid accumulation.  He has been traveling and eating more restaurant foods for the past month.  He has also missed a couple of dosages of Lasix while traveling.         Optivol thoracic impedance trending close to baseline normal but suggesting minimal possible fluid accumulation.             Prescribed:             Furosemide 40 mg take 1.5 tablets (60 mg total) by mouth daily. Spironolactone 25 mg take 1 tablet daily   Labs: 12/10/2022 Creatinine 1.12, BUN 18, Potassium 3.4, Sodium 140, GFR>60 A complete set of results can be found in Results Review   Recommendations:  No changes and any recommendations will be given at 9/19 HF clinic visit.     Follow-up plan: ICM clinic phone appointment on 06/02/2023.   91 day device clinic remote transmission 06/05/2023.     EP/Cardiology Office Visits:  05/01/2023 with HF clinic.   Recall 08/16/2023 with Dr Elberta Fortis.   Copy of ICM check sent to Dr. Graciela Husbands.        3 month ICM trend: 04/28/2023.    12-14 Month ICM trend:     [name], RN 04/29/2023 1:43 PM

## 2023-05-01 ENCOUNTER — Ambulatory Visit (HOSPITAL_COMMUNITY)
Admission: RE | Admit: 2023-05-01 | Discharge: 2023-05-01 | Disposition: A | Discharge status: Home / Self Care | Payer: 59 | Source: Ambulatory Visit | Attending: Cardiology | Admitting: Cardiology

## 2023-05-01 ENCOUNTER — Encounter (HOSPITAL_COMMUNITY): Payer: Self-pay

## 2023-05-01 ENCOUNTER — Telehealth (HOSPITAL_COMMUNITY): Payer: Self-pay

## 2023-05-01 VITALS — BP 110/60 | HR 85 | Wt 278.8 lb

## 2023-05-01 DIAGNOSIS — G4733 Obstructive sleep apnea (adult) (pediatric): Secondary | ICD-10-CM | POA: Diagnosis not present

## 2023-05-01 DIAGNOSIS — N183 Chronic kidney disease, stage 3 unspecified: Secondary | ICD-10-CM | POA: Insufficient documentation

## 2023-05-01 DIAGNOSIS — I5042 Chronic combined systolic (congestive) and diastolic (congestive) heart failure: Secondary | ICD-10-CM

## 2023-05-01 DIAGNOSIS — I130 Hypertensive heart and chronic kidney disease with heart failure and stage 1 through stage 4 chronic kidney disease, or unspecified chronic kidney disease: Secondary | ICD-10-CM | POA: Diagnosis not present

## 2023-05-01 DIAGNOSIS — E1122 Type 2 diabetes mellitus with diabetic chronic kidney disease: Secondary | ICD-10-CM | POA: Insufficient documentation

## 2023-05-01 DIAGNOSIS — Z79899 Other long term (current) drug therapy: Secondary | ICD-10-CM | POA: Diagnosis not present

## 2023-05-01 DIAGNOSIS — Z7984 Long term (current) use of oral hypoglycemic drugs: Secondary | ICD-10-CM | POA: Diagnosis not present

## 2023-05-01 DIAGNOSIS — J439 Emphysema, unspecified: Secondary | ICD-10-CM | POA: Insufficient documentation

## 2023-05-01 DIAGNOSIS — Z8249 Family history of ischemic heart disease and other diseases of the circulatory system: Secondary | ICD-10-CM | POA: Diagnosis not present

## 2023-05-01 DIAGNOSIS — I739 Peripheral vascular disease, unspecified: Secondary | ICD-10-CM | POA: Diagnosis not present

## 2023-05-01 DIAGNOSIS — Z7982 Long term (current) use of aspirin: Secondary | ICD-10-CM | POA: Insufficient documentation

## 2023-05-01 DIAGNOSIS — I428 Other cardiomyopathies: Secondary | ICD-10-CM | POA: Diagnosis not present

## 2023-05-01 DIAGNOSIS — I5022 Chronic systolic (congestive) heart failure: Secondary | ICD-10-CM | POA: Diagnosis not present

## 2023-05-01 DIAGNOSIS — F17210 Nicotine dependence, cigarettes, uncomplicated: Secondary | ICD-10-CM | POA: Diagnosis not present

## 2023-05-01 LAB — BASIC METABOLIC PANEL
Anion gap: 11 (ref 5–15)
BUN: 21 mg/dL (ref 8–23)
CO2: 25 mmol/L (ref 22–32)
Calcium: 8.9 mg/dL (ref 8.9–10.3)
Chloride: 101 mmol/L (ref 98–111)
Creatinine, Ser: 1.12 mg/dL (ref 0.61–1.24)
GFR, Estimated: >60 mL/min (ref >60)
Glucose, Bld: 181 mg/dL — ABNORMAL HIGH (ref 70–99)
Potassium: 3.6 mmol/L (ref 3.5–5.1)
Sodium: 137 mmol/L (ref 135–145)

## 2023-05-01 LAB — BRAIN NATRIURETIC PEPTIDE: B Natriuretic Peptide: 33.9 pg/mL (ref 0.0–100.0)

## 2023-05-01 MED ORDER — FUROSEMIDE 40 MG PO TABS
ORAL_TABLET | ORAL | 3 refills | Status: DC
Start: 2023-05-01 — End: 2023-08-01

## 2023-05-01 MED ORDER — FUROSEMIDE 40 MG PO TABS: 60.0000 mg | ORAL_TABLET | Freq: Every day | ORAL | 3 refills | Status: DC

## 2023-05-01 NOTE — Telephone Encounter (Signed)
Patient's med list has been changed, updated and medication sent to pharmacy. Pt is aware, agreeable, and verbalized understanding.

## 2023-05-01 NOTE — Progress Notes (Signed)
ID:  David Bright, DOB November 29, 1960, MRN [id number]   Provider location: [facility]  PCP:  Patrcia Dolly, DO  HF Cardiology: Dr. Shirlee Latch   History of Present Illness: David Bright is a 62 y.o. male who has a history of chronic systolic CHF and COPD/active smoking.  He has a known nonischemic cardiomyopathy, echo in 1/17 with EF 20% and a severely dilated LV.  No coronary disease on 7/16 cardiac cath.  Repeat echo 2/18, showing EF up to 40%.   He had significant PAD by peripheral arterial dopplers, saw Dr. Allyson Sabal with plan of medical management for now. He also has knee pain which is limiting.   Echo 09/2017: EF up to 50-55% with mild LVH. Echo in 1/20 showed EF 55-60%.  Echo in 3/21 showed EF 55-60% with normal RV.  Echo in 11/22 with EF 60-65%, moderate LVH, normal RV.   Echo 4/24 EF 45-50%, mild RV dysfunction, normal IVC.   LE Arterial dopplers 5/24: Rt ABI 0.86, Lt ABI 0.81   He presents today for routine f/u. Doing fairly well. Reports stable NYHA Class II symptoms. Denies resting dyspnea. No CP. BP well controlled. Reports full med compliance, but only taking Lasix 40 mg once daily instead of 60 mg daily (simply b/c he hates cutting a pill in half). Device interrogation shows subsequent fluid accumulation. Impedence has been hovering slightly below reference curve over the last 3 months. Fluid index has been persistently elevated but has remained below threshold. No AT/AF  Reports full compliance w/ CPAP. Still smoking but has cut down to ~1/2 ppd. Failed Chantix x 2.   Medtronic device interrogation: Fluid index < threshold, no AF/VT.    ECG: not performed   Labs (12/17): K 4.3, creatinine 1.09 Labs (1/18): K 3.9, creatinine 1.13 Labs (3/18): K 4.3, creatinine 1.48, NT-proBNP 61 Labs (4/18): K 4.5, creatinine 1.23, LDL 71, BNP 34 Labs (5/18): K 4.5, creatinine 1.2 Labs (1/19): K 4.2, creatinine 1.19, LDL 69, HDL 39 Labs (11/19): LDL 66, HDL 34, K 3.3,  creatinine 1.19, BNP 45 Labs (12/19): K 4.4, creatinine 1.59 Labs (8/20): K 4.5, creatinine 1.66 Labs (2/21): K 4.5, creatinine 1.63 Labs (3/21): LDL 64, HDL 38 Labs (6/21): K 4.2, creatinine 1.24 Labs (10/21): K 4.1, creatinine 1.43 Labs (1/23): K 3.9, creatinine 1.28 Labs (11/23): K 3.9, creatinine 1.26, hgb 15.7 Labs (4/23): K 3.4, creatinine 1.12, Hgb A1c 8.0   PMH:  1. Chronic systolic CHF: Nonischemic cardiomyopathy.  Has Medtronic ICD.   - LHC (7/16) with nonobstructive disease.  - Echo (1/17) with EF 20%, severe LV dilation, mild to moderate MR.  - Echo (2/18): EF 40%, mild LV dilation.  - Echo (5/19): EF 50-55%, mild LVH - Echo (1/20): EF 55-60%, mild LVH, normal RV size and systolic function.  - Echo (3/20): EF 50-55%, RV normal, IVC normal - Echo (3/21): EF 55-60%, mild LVH - Echo (11/22): EF 60-65%, moderate LVH, normal RV.  - Echo (4/24): EF 45-50%, mild RV dysfunction, normal IVC.  2. COPD: Active smoker.  - PFTs (2/18) with minimal obstruction, mild restriction suggesting a parenchymal process, low DLCO.  - CT chest (6/18): No ILD, +emphysema, lung nodules.  - CT chest (11/19): Emphysema, no ILD, stable nodules 3. Type II diabetes with peripheral neuropathy.  4. OSA on CPAP 5. HTN 6. Hyperlipidemia 7. PAD: ABIs abnormal at outside office.  - Peripheral arterial dopplers (3/18) with > 50% left CIA and bilateral EIA stenosis, 50-74%  mid LSFA stenosis.  - ABIs (2/19): 0.85 right, 0.86 left (mild).  - ABIs (5/20): 0.89 right, 0.86 left (mild).  - ABIs (5/21): 0.9 right, 0.84 left (mild) - ABIs (11/22): 0.9 right, 0.84 left (mild) 8. CKD: Stage 3.  9. B12 deficiency.  10. COVID-19 7/22  SH: Lives in [place], works at [employer], smokes 1 ppd.  Rare ETOH.  Remote substance abuse.   FH: Aunt with CHF, brother with ESRD.    Review of systems complete and found to be negative unless listed in HPI.   Current Outpatient Medications  Medication Sig Dispense  Refill   aspirin EC 81 MG tablet Take 1 tablet (81 mg total) by mouth daily. 30 tablet 0   atorvastatin (LIPITOR) 40 MG tablet Take 1 tablet (40 mg total) by mouth daily. 90 tablet 3   carvedilol (COREG) 12.5 MG tablet Take 1 tablet (12.5 mg total) by mouth 2 (two) times daily with a meal. 180 tablet 3   dapagliflozin propanediol (FARXIGA) 10 MG TABS tablet Take 1 tablet (10 mg total) by mouth daily before breakfast. 30 tablet 6   ENTRESTO 97-103 MG Take 1 tablet by mouth twice daily 180 tablet 0   furosemide (LASIX) 40 MG tablet TAKE 1 & 1/2 (ONE & ONE-HALF) TABLETS BY MOUTH ONCE DAILY (Patient taking differently: Take 40 mg by mouth daily. Take 1-2 tablets daily) 135 tablet 4   insulin glargine (LANTUS) 100 UNIT/ML injection Inject 20 Units into the skin at bedtime.     OZEMPIC, 1 MG/DOSE, 4 MG/3ML SOPN Inject 1 mg into the skin once a week.     sildenafil (VIAGRA) 100 MG tablet SMARTSIG:1 Tablet(s) By Mouth Every 3 Days PRN     spironolactone (ALDACTONE) 25 MG tablet Take 1 tablet by mouth once daily 90 tablet 3   Vitamin D, Ergocalciferol, (DRISDOL) 1.25 MG (50000 UNIT) CAPS capsule Take 50,000 Units by mouth once a week.     No current facility-administered medications for this encounter.   Exam:   BP 110/60   Pulse 85   Wt 126.5 kg (278 lb 12.8 oz)   SpO2 96%   BMI 37.81 kg/m  General:  Well appearing, moderately obese. No respiratory difficulty HEENT: normal Neck: supple. JVD 8 cm. Carotids 2+ bilat; no bruits. No lymphadenopathy or thyromegaly appreciated. Cor: PMI nondisplaced. Regular rate & rhythm. No rubs, gallops or murmurs. Lungs: clear Abdomen: obese, soft, nontender, nondistended. No hepatosplenomegaly. No bruits or masses. Good bowel sounds. Extremities: no cyanosis, clubbing, rash, edema Neuro: alert & oriented x 3, cranial nerves grossly intact. moves all 4 extremities w/o difficulty. Affect pleasant.   Assessment/Plan: 1. Chronic systolic CHF: Nonischemic  cardiomyopathy => ?due to viral myocarditis or prior substance abuse.  Medtronic ICD.  Echo 1/17 with severely dilated LV and EF 20%, EF up to 40% on echo 2/18.  Echo in 1/20 showed EF up to 55% with normal RV, and echo in 3/21 and again in 11/22 showed EF 60%.  Most recent Echo 4/24 showed mildly decreased EF 45-50%. Normal RV. Reports stable NYHA Class II symptoms. Device interrogation shows fluid accumulation. Impedence has been hovering slightly below reference curve over the last 3 months. Fluid index has been elevated but has remained below threshold. Not taking correct dose of Lasix and prescribed.  - Increase Lasix back to 60 mg daily as previously directed. Stressed importance of maintaining full compliance.   - Continue spironolactone 25 mg daily - Continue Entresto 97/103 bid -  Continue dapagliflozin 10 daily.  - Continue Coreg 12.5 mg bid. No dose titration given mild volume overload and need for diuresis  - BMP and BNP today  2. Smoking/suspect COPD:  PFTs done, somewhat concerning for interstitial lung disease rather than emphysema with restrictive PFTs and decreased DLCO => no ILD on CT, emphysema noted along with lung nodules. Most recent repeat CT in 11/19 showed stable nodules, do not need repeat. He continues to smoke about 1/2 pack/day.  Failed Wellbutrin and Chantix x 2  - encouraged to quit.  He will work on this  3. OSA: reports full compliance w/ CPAP. Continue at bedtime  4. PAD: Significant disease on peripheral dopplers. He saw Dr. Allyson Sabal, medical management recommended.  ABIs in 11/22 mildly reduced.  Repeat Dopplers 5/24, stable = Rt ABI 0.86, Lt ABI 0.81 - encouraged walking routine  - Continue ASA 81 and statin.  Lipids controlled 6/24 (LDL 67)   F/u w/ Dr. Shirlee Latch in 3 months.   Signed, Robbie Lis, PA-C  05/01/2023  [facility] [address] [facility] [place] [address] [phone] (office) [phone]  (fax)

## 2023-05-01 NOTE — Patient Instructions (Signed)
Labs done today. We will contact you only if your labs are abnormal.  INCREASE Lasix to 60mg  (1 & 1/2 tablets) by mouth daily.   No other medication changes were made. Please continue all current medications as prescribed.  Your physician recommends that you schedule a follow-up appointment in: 3 months with Dr. Shirlee Latch  If you have any questions or concerns before your next appointment please send [place] a message through [facility] or call our office at [phone].    TO LEAVE A MESSAGE FOR THE NURSE SELECT OPTION 2, PLEASE LEAVE A MESSAGE INCLUDING: YOUR NAME DATE OF BIRTH CALL BACK NUMBER REASON FOR CALL**this is important as we prioritize the call backs  YOU WILL RECEIVE A CALL BACK THE SAME DAY AS LONG AS YOU CALL BEFORE 4:00 PM   Do the following things EVERYDAY: Weigh yourself in the morning before breakfast. Write it down and keep it in a log. Take your medicines as prescribed Eat low salt foods--Limit salt (sodium) to 2000 mg per day.  Stay as active as you can everyday Limit all fluids for the day to less than 2 liters   At the [facility], you and your health needs are our priority. As part of our continuing mission to provide you with exceptional heart care, we have created designated Provider Care Teams. These Care Teams include your primary Cardiologist (physician) and Advanced Practice Providers (APPs- Physician Assistants and Nurse Practitioners) who all work together to provide you with the care you need, when you need it.   You may see any of the following providers on your designated Care Team at your next follow up: Dr Arvilla Meres Dr Marca Ancona Dr. Marcos Eke, NP Robbie Lis, [place] [facility] [place], [place] Brynda Peon, NP Karle Plumber, PharmD   Please be sure to bring in all your medications bottles to every appointment.    Thank you for choosing [facility]

## 2023-05-01 NOTE — Telephone Encounter (Signed)
-----   Message from [place] sent at 05/01/2023  3:30 PM EDT ----- Labs stable. Instruct pt to alternate lasix every other day between 60 mg daily and 40 mg daily.

## 2023-05-26 ENCOUNTER — Other Ambulatory Visit (HOSPITAL_COMMUNITY): Payer: Self-pay | Admitting: Cardiology

## 2023-05-27 ENCOUNTER — Encounter: Payer: Self-pay | Admitting: Cardiology

## 2023-06-02 ENCOUNTER — Ambulatory Visit: Payer: 59 | Attending: Internal Medicine

## 2023-06-02 DIAGNOSIS — I5042 Chronic combined systolic (congestive) and diastolic (congestive) heart failure: Secondary | ICD-10-CM | POA: Diagnosis not present

## 2023-06-02 DIAGNOSIS — Z95810 Presence of automatic (implantable) cardiac defibrillator: Secondary | ICD-10-CM

## 2023-06-05 ENCOUNTER — Ambulatory Visit (INDEPENDENT_AMBULATORY_CARE_PROVIDER_SITE_OTHER): Payer: 59

## 2023-06-05 DIAGNOSIS — I428 Other cardiomyopathies: Secondary | ICD-10-CM

## 2023-06-05 LAB — CUP PACEART REMOTE DEVICE CHECK
Battery Remaining Longevity: 39 mo
Battery Voltage: 2.97 V
Brady Statistic RV Percent Paced: 0 %
Date Time Interrogation Session: 20241024022723
HighPow Impedance: 72 Ohm
Implantable Lead Connection Status: 753985
Implantable Lead Implant Date: 20160725
Implantable Lead Location: 753860
Implantable Lead Model: 293
Implantable Lead Serial Number: 383593
Implantable Pulse Generator Implant Date: 20160725
Lead Channel Impedance Value: 399 Ohm
Lead Channel Impedance Value: 437 Ohm
Lead Channel Pacing Threshold Amplitude: 0.75 V
Lead Channel Pacing Threshold Pulse Width: 0.4 ms
Lead Channel Sensing Intrinsic Amplitude: 14.625 mV
Lead Channel Sensing Intrinsic Amplitude: 14.625 mV
Lead Channel Setting Pacing Amplitude: 2.5 V
Lead Channel Setting Pacing Pulse Width: 0.4 ms
Lead Channel Setting Sensing Sensitivity: 0.3 mV
Zone Setting Status: 755011

## 2023-06-05 NOTE — Progress Notes (Signed)
EPIC Encounter for ICM Monitoring  Patient Name: David Bright is a 62 y.o. male Date: 06/05/2023 Primary Care Physican: Patrcia Dolly, DO Primary Cardiologist: Nelson/McLean   Electrophysiologist:  Graciela Husbands 10/04/2022 Weight: 288 lbs 12/10/2022 Weight: 277 lbs 02/21/2023 Weight: 275 lbs 04/09/2023 Weight: 275 lbs           Spoke with patient and heart failure questions reviewed.  Transmission results reviewed.  Pt asymptomatic for fluid accumulation.          Optivol thoracic impedance suggesting normal fluid levels.             Prescribed:             Furosemide 40 mg take 1.5 tablets (60 mg total) by mouth alternating with 40 every other day. Spironolactone 25 mg take 1 tablet daily   Labs: 05/01/2023 Creatinine 1.12, BUN 21, Potassium 3.6, Sodium 137, GFR >60 12/10/2022 Creatinine 1.12, BUN 18, Potassium 3.4, Sodium 140, GFR>60 A complete set of results can be found in Results Review   Recommendations: No changes and encouraged to call if experiencing any fluid symptoms.   Follow-up plan: ICM clinic phone appointment on 07/07/2023.   91 day device clinic remote transmission 09/04/2023.     EP/Cardiology Office Visits:  07/31/2023 with HF clinic.   Recall 08/16/2023 with Dr Elberta Fortis.   Copy of ICM check sent to Dr. Graciela Husbands.      3 month ICM trend: 06/05/2023.    12-14 Month ICM trend:     [name], RN 06/05/2023 4:21 PM

## 2023-06-24 NOTE — Progress Notes (Signed)
Remote ICD transmission.   

## 2023-07-07 ENCOUNTER — Ambulatory Visit: Payer: 59 | Attending: Internal Medicine

## 2023-07-07 DIAGNOSIS — Z95810 Presence of automatic (implantable) cardiac defibrillator: Secondary | ICD-10-CM

## 2023-07-07 DIAGNOSIS — I5042 Chronic combined systolic (congestive) and diastolic (congestive) heart failure: Secondary | ICD-10-CM

## 2023-07-09 ENCOUNTER — Telehealth: Payer: Self-pay

## 2023-07-09 NOTE — Telephone Encounter (Signed)
Remote ICM transmission received.  Attempted call to patient regarding ICM remote transmission and no answer.  

## 2023-07-09 NOTE — Progress Notes (Signed)
EPIC Encounter for ICM Monitoring  Patient Name: David Bright is a 62 y.o. male Date: 07/09/2023 Primary Care Physican: Patrcia Dolly, DO Primary Cardiologist: Nelson/McLean   Electrophysiologist:  Graciela Husbands 10/04/2022 Weight: 288 lbs 12/10/2022 Weight: 277 lbs 02/21/2023 Weight: 275 lbs 04/09/2023 Weight: 275 lbs           Attempted call to patient and unable to reach.  Transmission reviewed.          Optivol thoracic impedance suggesting normal fluid levels with intermittent days of possible fluid accumulation between 11/7-11/18.         Prescribed:             Furosemide 40 mg take 1.5 tablets (60 mg total) by mouth alternating with 40 every other day. Spironolactone 25 mg take 1 tablet daily   Labs: 05/01/2023 Creatinine 1.12, BUN 21, Potassium 3.6, Sodium 137, GFR >60 12/10/2022 Creatinine 1.12, BUN 18, Potassium 3.4, Sodium 140, GFR>60 A complete set of results can be found in Results Review   Recommendations: Unable to reach.     Follow-up plan: ICM clinic phone appointment on 06/11/2023.   91 day device clinic remote transmission 09/04/2023.     EP/Cardiology Office Visits:  07/31/2023 with HF clinic.   Recall 08/16/2023 with Dr Elberta Fortis.   Copy of ICM check sent to Dr. Graciela Husbands.      3 month ICM trend: 07/07/2023.    12-14 Month ICM trend:     [name], RN 07/09/2023 2:59 PM

## 2023-07-31 ENCOUNTER — Encounter (HOSPITAL_COMMUNITY): Payer: 59 | Admitting: Cardiology

## 2023-08-01 ENCOUNTER — Ambulatory Visit (HOSPITAL_COMMUNITY)
Admission: RE | Admit: 2023-08-01 | Discharge: 2023-08-01 | Disposition: A | Discharge status: Home / Self Care | Payer: 59 | Source: Ambulatory Visit | Attending: Cardiology | Admitting: Cardiology

## 2023-08-01 ENCOUNTER — Encounter (HOSPITAL_COMMUNITY): Payer: Self-pay | Admitting: Cardiology

## 2023-08-01 VITALS — BP 98/60 | HR 82 | Wt 266.6 lb

## 2023-08-01 DIAGNOSIS — I444 Left anterior fascicular block: Secondary | ICD-10-CM | POA: Insufficient documentation

## 2023-08-01 DIAGNOSIS — E785 Hyperlipidemia, unspecified: Secondary | ICD-10-CM | POA: Diagnosis not present

## 2023-08-01 DIAGNOSIS — E1142 Type 2 diabetes mellitus with diabetic polyneuropathy: Secondary | ICD-10-CM | POA: Insufficient documentation

## 2023-08-01 DIAGNOSIS — G4733 Obstructive sleep apnea (adult) (pediatric): Secondary | ICD-10-CM | POA: Insufficient documentation

## 2023-08-01 DIAGNOSIS — J439 Emphysema, unspecified: Secondary | ICD-10-CM | POA: Diagnosis not present

## 2023-08-01 DIAGNOSIS — I130 Hypertensive heart and chronic kidney disease with heart failure and stage 1 through stage 4 chronic kidney disease, or unspecified chronic kidney disease: Secondary | ICD-10-CM | POA: Insufficient documentation

## 2023-08-01 DIAGNOSIS — Z8616 Personal history of COVID-19: Secondary | ICD-10-CM | POA: Insufficient documentation

## 2023-08-01 DIAGNOSIS — E1122 Type 2 diabetes mellitus with diabetic chronic kidney disease: Secondary | ICD-10-CM | POA: Diagnosis not present

## 2023-08-01 DIAGNOSIS — F17210 Nicotine dependence, cigarettes, uncomplicated: Secondary | ICD-10-CM | POA: Insufficient documentation

## 2023-08-01 DIAGNOSIS — Z7984 Long term (current) use of oral hypoglycemic drugs: Secondary | ICD-10-CM | POA: Insufficient documentation

## 2023-08-01 DIAGNOSIS — Z79899 Other long term (current) drug therapy: Secondary | ICD-10-CM | POA: Diagnosis not present

## 2023-08-01 DIAGNOSIS — Z7985 Long-term (current) use of injectable non-insulin antidiabetic drugs: Secondary | ICD-10-CM | POA: Diagnosis not present

## 2023-08-01 DIAGNOSIS — I5022 Chronic systolic (congestive) heart failure: Secondary | ICD-10-CM | POA: Insufficient documentation

## 2023-08-01 DIAGNOSIS — I428 Other cardiomyopathies: Secondary | ICD-10-CM | POA: Insufficient documentation

## 2023-08-01 DIAGNOSIS — N183 Chronic kidney disease, stage 3 unspecified: Secondary | ICD-10-CM | POA: Diagnosis not present

## 2023-08-01 DIAGNOSIS — I5042 Chronic combined systolic (congestive) and diastolic (congestive) heart failure: Secondary | ICD-10-CM

## 2023-08-01 LAB — BASIC METABOLIC PANEL
Anion gap: 11 (ref 5–15)
BUN: 27 mg/dL — ABNORMAL HIGH (ref 8–23)
CO2: 26 mmol/L (ref 22–32)
Calcium: 9.4 mg/dL (ref 8.9–10.3)
Chloride: 102 mmol/L (ref 98–111)
Creatinine, Ser: 1.38 mg/dL — ABNORMAL HIGH (ref 0.61–1.24)
GFR, Estimated: 57 mL/min — ABNORMAL LOW (ref >60)
Glucose, Bld: 176 mg/dL — ABNORMAL HIGH (ref 70–99)
Potassium: 4.4 mmol/L (ref 3.5–5.1)
Sodium: 139 mmol/L (ref 135–145)

## 2023-08-01 LAB — BRAIN NATRIURETIC PEPTIDE: B Natriuretic Peptide: 32.3 pg/mL (ref 0.0–100.0)

## 2023-08-01 NOTE — Patient Instructions (Signed)
No change in medications. Labs today - will call you if abnormal. Return to Heart Failure APP Clinic in 4 months - see below. Please call us at [phone] if any issues or concerns prior to next visit.

## 2023-08-03 NOTE — Progress Notes (Signed)
ID:  David Bright, DOB April 09, 1961, MRN [id number]   Provider location: [facility]  PCP:  Patrcia Dolly, DO  HF Cardiology: Dr. Shirlee Latch   History of Present Illness: David Bright is a 62 y.o. male who has a history of chronic systolic CHF and COPD/active smoking.  He has a known nonischemic cardiomyopathy, echo in 1/17 with EF 20% and a severely dilated LV.  No coronary disease on 7/16 cardiac cath.  Repeat echo 2/18, showing EF up to 40%.   He had significant PAD by peripheral arterial dopplers, saw Dr. Allyson Sabal with plan of medical management for now. He also has knee pain which is limiting.   Echo 09/2017: EF up to 50-55% with mild LVH. Echo in 1/20 showed EF 55-60%.  Echo in 3/21 showed EF 55-60% with normal RV.  Echo in 11/22 with EF 60-65%, moderate LVH, normal RV.   Echo 4/24 EF 45-50%, mild RV dysfunction, normal IVC.   LE Arterial dopplers 5/24: Rt ABI 0.86, Lt ABI 0.81   He presents today for followup of CHF.  Still smoking, failed Chantix.  Using CPAP.  He gets tired walking long distances or walking up stairs.  No claudication, no pedal ulcerations.  No chest pain or lightheadedness.  Weight is down 12 lbs, he is on semaglutide.  No orthopnea/PND.   Medtronic device interrogation: Stable thoracic impedance, no VT.   ECG: NSR, LAFB, poor RWP  Labs (12/17): K 4.3, creatinine 1.09 Labs (1/18): K 3.9, creatinine 1.13 Labs (3/18): K 4.3, creatinine 1.48, NT-proBNP 61 Labs (4/18): K 4.5, creatinine 1.23, LDL 71, BNP 34 Labs (5/18): K 4.5, creatinine 1.2 Labs (1/19): K 4.2, creatinine 1.19, LDL 69, HDL 39 Labs (11/19): LDL 66, HDL 34, K 3.3, creatinine 4.09, BNP 45 Labs (12/19): K 4.4, creatinine 1.59 Labs (8/20): K 4.5, creatinine 1.66 Labs (2/21): K 4.5, creatinine 1.63 Labs (3/21): LDL 64, HDL 38 Labs (6/21): K 4.2, creatinine 1.24 Labs (10/21): K 4.1, creatinine 1.43 Labs (1/23): K 3.9, creatinine 1.28 Labs (11/23): K 3.9, creatinine 1.26, hgb  15.7 Labs (4/23): K 3.4, creatinine 1.12, Hgb A1c 8.0  Labs (7/24): LDL 67 Labs (9/24): BNP 34, K 3.6, creatinine 1.12  PMH:  1. Chronic systolic CHF: Nonischemic cardiomyopathy.  Has Medtronic ICD.   - LHC (7/16) with nonobstructive disease.  - Echo (1/17) with EF 20%, severe LV dilation, mild to moderate MR.  - Echo (2/18): EF 40%, mild LV dilation.  - Echo (5/19): EF 50-55%, mild LVH - Echo (1/20): EF 55-60%, mild LVH, normal RV size and systolic function.  - Echo (3/20): EF 50-55%, RV normal, IVC normal - Echo (3/21): EF 55-60%, mild LVH - Echo (11/22): EF 60-65%, moderate LVH, normal RV.  - Echo (4/24): EF 45-50%, mild RV dysfunction, normal IVC.  2. COPD: Active smoker.  - PFTs (2/18) with minimal obstruction, mild restriction suggesting a parenchymal process, low DLCO.  - CT chest (6/18): No ILD, +emphysema, lung nodules.  - CT chest (11/19): Emphysema, no ILD, stable nodules 3. Type II diabetes with peripheral neuropathy.  4. OSA on CPAP 5. HTN 6. Hyperlipidemia 7. PAD: ABIs abnormal at outside office.  - Peripheral arterial dopplers (3/18) with > 50% left CIA and bilateral EIA stenosis, 50-74% mid LSFA stenosis.  - ABIs (2/19): 0.85 right, 0.86 left (mild).  - ABIs (5/20): 0.89 right, 0.86 left (mild).  - ABIs (5/21): 0.9 right, 0.84 left (mild) - ABIs (11/22): 0.9 right, 0.84 left (mild)  8. CKD: Stage 3.  9. B12 deficiency.  10. COVID-19 7/22  SH: Lives in [place], works at [employer], smokes 1 ppd.  Rare ETOH.  Remote substance abuse.   FH: Aunt with CHF, brother with ESRD.    Review of systems complete and found to be negative unless listed in HPI.   Current Outpatient Medications  Medication Sig Dispense Refill   aspirin EC 81 MG tablet Take 1 tablet (81 mg total) by mouth daily. 30 tablet 0   atorvastatin (LIPITOR) 40 MG tablet Take 1 tablet (40 mg total) by mouth daily. 90 tablet 3   carvedilol (COREG) 12.5 MG tablet Take 1 tablet (12.5 mg total) by  mouth 2 (two) times daily with a meal. 180 tablet 3   dapagliflozin propanediol (FARXIGA) 10 MG TABS tablet Take 1 tablet (10 mg total) by mouth daily before breakfast. 30 tablet 6   furosemide (LASIX) 40 MG tablet Take 40 mg by mouth daily.     insulin glargine (LANTUS) 100 UNIT/ML injection Inject 20 Units into the skin at bedtime.     OZEMPIC, 1 MG/DOSE, 4 MG/3ML SOPN Inject 1 mg into the skin once a week.     sacubitril-valsartan (ENTRESTO) 97-103 MG Take 1 tablet by mouth twice daily 180 tablet 1   sildenafil (VIAGRA) 100 MG tablet SMARTSIG:1 Tablet(s) By Mouth Every 3 Days PRN     spironolactone (ALDACTONE) 25 MG tablet Take 1 tablet by mouth once daily 90 tablet 3   tamsulosin (FLOMAX) 0.4 MG CAPS capsule Take 0.4 mg by mouth daily.     Vitamin D, Ergocalciferol, (DRISDOL) 1.25 MG (50000 UNIT) CAPS capsule Take 50,000 Units by mouth once a week.     No current facility-administered medications for this encounter.   Exam:   BP 98/60   Pulse 82   Wt 120.9 kg (266 lb 9.6 oz)   SpO2 96%   BMI 36.16 kg/m  General: NAD Neck: No JVD, no thyromegaly or thyroid nodule.  Lungs: Clear to auscultation bilaterally with normal respiratory effort. CV: Nondisplaced PMI.  Heart regular S1/S2, no S3/S4, no murmur.  No peripheral edema.  No carotid bruit.  Difficult to palpate pedal pulses.  Abdomen: Soft, nontender, no hepatosplenomegaly, no distention.  Skin: Intact without lesions or rashes.  Neurologic: Alert and oriented x 3.  Psych: Normal affect. Extremities: No clubbing or cyanosis.  HEENT: Normal.   Assessment/Plan: 1. Chronic systolic CHF: Nonischemic cardiomyopathy => ?due to viral myocarditis or prior substance abuse.  Medtronic ICD.  Echo 1/17 with severely dilated LV and EF 20%, EF up to 40% on echo 2/18.  Echo in 1/20 showed EF up to 55% with normal RV, and echo in 3/21 and again in 11/22 showed EF 60%.  Most recent Echo 4/24 showed mildly decreased EF 45-50%, normal RV. NYHA class  II symptoms, not volume overloaded on exam or by Optivol.  - Continue Lasix 40 mg daily.  BMET/BNP today.  - Continue spironolactone 25 mg daily - Continue Entresto 97/103 bid - Continue dapagliflozin 10 daily.  - Continue Coreg 12.5 mg bid. No BP room to increase.  2. Smoking/suspect COPD:  PFTs done, somewhat concerning for interstitial lung disease rather than emphysema with restrictive PFTs and decreased DLCO => no ILD on CT, emphysema noted along with lung nodules. Most recent repeat CT in 11/19 showed stable nodules, do not need repeat. He continues to smoke about 1/2 pack/day.  Failed Wellbutrin and Chantix x 2  -  encouraged to quit.  He will work on this  3. OSA: reports full compliance w/ CPAP. Continue at bedtime  4. PAD: Significant disease on peripheral dopplers. He saw Dr. Allyson Sabal, medical management recommended.  ABIs in 11/22 mildly reduced.  ABIs 5/24 were stable with Rt ABI 0.86, Lt ABI 0.81 - encouraged walking routine  - Continue ASA 81 and statin.  Lipids controlled 7/24 (LDL 67)   Followup with APP in 4 months.   Signed, Marca Ancona, MD  08/03/2023  [facility] [address] [facility] [place] [address] [phone] (office) [phone] (fax)

## 2023-08-11 ENCOUNTER — Ambulatory Visit: Payer: 59 | Attending: Internal Medicine

## 2023-08-11 DIAGNOSIS — I5042 Chronic combined systolic (congestive) and diastolic (congestive) heart failure: Secondary | ICD-10-CM | POA: Diagnosis not present

## 2023-08-11 DIAGNOSIS — Z95810 Presence of automatic (implantable) cardiac defibrillator: Secondary | ICD-10-CM

## 2023-08-12 ENCOUNTER — Encounter: Payer: Self-pay | Admitting: Cardiology

## 2023-09-04 ENCOUNTER — Ambulatory Visit: Payer: 59

## 2023-09-04 DIAGNOSIS — I428 Other cardiomyopathies: Secondary | ICD-10-CM

## 2023-09-04 LAB — CUP PACEART REMOTE DEVICE CHECK
Battery Remaining Longevity: 37 mo
Battery Voltage: 2.97 V
Brady Statistic RV Percent Paced: 0.01 %
Date Time Interrogation Session: 20250123031801
HighPow Impedance: 68 Ohm
Implantable Lead Connection Status: 753985
Implantable Lead Implant Date: 20160725
Implantable Lead Location: 753860
Implantable Lead Model: 293
Implantable Lead Serial Number: 383593
Implantable Pulse Generator Implant Date: 20160725
Lead Channel Impedance Value: 380 Ohm
Lead Channel Impedance Value: 380 Ohm
Lead Channel Pacing Threshold Amplitude: 0.75 V
Lead Channel Pacing Threshold Pulse Width: 0.4 ms
Lead Channel Sensing Intrinsic Amplitude: 14.75 mV
Lead Channel Sensing Intrinsic Amplitude: 14.75 mV
Lead Channel Setting Pacing Amplitude: 2.5 V
Lead Channel Setting Pacing Pulse Width: 0.4 ms
Lead Channel Setting Sensing Sensitivity: 0.3 mV
Zone Setting Status: 755011

## 2023-09-15 ENCOUNTER — Ambulatory Visit: Payer: 59 | Attending: Internal Medicine

## 2023-09-15 DIAGNOSIS — Z95810 Presence of automatic (implantable) cardiac defibrillator: Secondary | ICD-10-CM

## 2023-09-15 DIAGNOSIS — I5042 Chronic combined systolic (congestive) and diastolic (congestive) heart failure: Secondary | ICD-10-CM

## 2023-09-16 MED ORDER — FUROSEMIDE 40 MG PO TABS: ORAL_TABLET | ORAL | 2 refills | Status: DC

## 2023-09-16 NOTE — Progress Notes (Signed)
EPIC Encounter for ICM Monitoring  Patient Name: David Bright is a 63 y.o. male Date: 09/16/2023 Primary Care Physican: Patrcia Dolly, DO Primary Cardiologist: Nelson/McLean   Electrophysiologist:  Graciela Husbands 10/04/2022 Weight: 288 lbs 12/10/2022 Weight: 277 lbs 02/21/2023 Weight: 275 lbs 04/09/2023 Weight: 275 lbs 09/15/2022 Weight: 275 lbs          Spoke with patient and heart failure questions reviewed.  Transmission results reviewed.  Pt asymptomatic for fluid accumulation.  Reports feeling well at this time and voices no complaints.           Diet:  2/4 reports he has been eating a lot of restaurant foods over the last couple of months.         Optivol thoracic impedance suggesting possible fluid accumulation starting 1/13.         Prescribed:             Furosemide 40 mg take 1 tablet (40 mg total) by mouth daily. Spironolactone 25 mg take 1 tablet daily   Labs: 08/01/2023 Creatinine 1.38, BUN 27, Potassium 4.4, Sodium 139, GFR 57 05/01/2023 Creatinine 1.12, BUN 21, Potassium 3.6, Sodium 137, GFR >60 12/10/2022 Creatinine 1.12, BUN 18, Potassium 3.4, Sodium 140, GFR>60 A complete set of results can be found in Results Review   Recommendations:  Copy sent to Dr Shirlee Latch for review and recommendations if needed.    Follow-up plan: ICM clinic phone appointment on 09/23/2023 to recheck fluid levels.   91 day device clinic remote transmission 12/04/2023.     EP/Cardiology Office Visits:  12/05/2023 with HF clinic.   Recall 08/16/2023 with Dr Elberta Fortis.   Copy of ICM check sent to Dr. Graciela Husbands.     3 month ICM trend: 09/15/2023.    12-14 Month ICM trend:     [name], RN 09/16/2023 4:40 PM

## 2023-09-16 NOTE — Progress Notes (Signed)
Spoke with patient and advised Dr Shirlee Latch recommended to take Furosemide 40 mg every morning and 0.5 tablet (20 mg total) every afternoon.  BMET in a week and scheduled for 2/12 at HF clinic.  He agreed to plan.  E script sent to [facility] preferred pharmacy and BMET order placed.

## 2023-09-16 NOTE — Progress Notes (Signed)
Increase Lasix to 40 qam/20 qpm, BMET 1 week.

## 2023-09-23 ENCOUNTER — Ambulatory Visit: Payer: 59 | Attending: Internal Medicine

## 2023-09-23 DIAGNOSIS — I5042 Chronic combined systolic (congestive) and diastolic (congestive) heart failure: Secondary | ICD-10-CM

## 2023-09-23 DIAGNOSIS — Z95810 Presence of automatic (implantable) cardiac defibrillator: Secondary | ICD-10-CM

## 2023-09-24 ENCOUNTER — Other Ambulatory Visit (HOSPITAL_COMMUNITY): Payer: 59

## 2023-09-24 NOTE — Progress Notes (Signed)
EPIC Encounter for ICM Monitoring  Patient Name: David Bright is a 63 y.o. male Date: 09/24/2023 Primary Care Physican: Patrcia Dolly, DO Primary Cardiologist: Nelson/McLean   Electrophysiologist:  Graciela Husbands 10/04/2022 Weight: 288 lbs 12/10/2022 Weight: 277 lbs 02/21/2023 Weight: 275 lbs 04/09/2023 Weight: 275 lbs 09/15/2022 Weight: 275 lbs          Spoke with patient and heart failure questions reviewed.  Transmission results reviewed.  Pt asymptomatic for fluid accumulation.  Reports feeling well at this time and voices no complaints.           Diet:  2/4 reports he has been eating a lot of restaurant foods over the last couple of months.          Optivol thoracic impedance suggesting fluid levels returned to normal after Lasix increased to 40/20.         Prescribed:             Furosemide 40 mg take 1 tablet (40 mg total) by mouth every morning and 0.5 tablet (20 mg total) by mouth every evening. Spironolactone 25 mg take 1 tablet daily   Labs: 09/24/2023 Pt rescheduled BMET from 2/12 to 2/14.  Advised importance of having labs drawn 08/01/2023 Creatinine 1.38, BUN 27, Potassium 4.4, Sodium 139, GFR 57 05/01/2023 Creatinine 1.12, BUN 21, Potassium 3.6, Sodium 137, GFR >60 12/10/2022 Creatinine 1.12, BUN 18, Potassium 3.4, Sodium 140, GFR>60 A complete set of results can be found in Results Review   Recommendations: No changes and encouraged to call if experiencing any fluid symptoms.   Follow-up plan: ICM clinic phone appointment on 10/20/2023.   91 day device clinic remote transmission 12/04/2023.     EP/Cardiology Office Visits:  12/05/2023 with HF clinic.   Recall 08/16/2023 with Dr Elberta Fortis.   Copy of ICM check sent to Dr. Graciela Husbands.   3 month ICM trend: 09/22/2023.    12-14 Month ICM trend:     [name], RN 09/24/2023 12:15 PM

## 2023-09-26 ENCOUNTER — Ambulatory Visit (HOSPITAL_COMMUNITY)
Admission: RE | Admit: 2023-09-26 | Discharge: 2023-09-26 | Disposition: A | Discharge status: Home / Self Care | Payer: 59 | Source: Ambulatory Visit | Attending: Cardiology | Admitting: Cardiology

## 2023-09-26 DIAGNOSIS — Z95810 Presence of automatic (implantable) cardiac defibrillator: Secondary | ICD-10-CM | POA: Insufficient documentation

## 2023-09-26 DIAGNOSIS — I5042 Chronic combined systolic (congestive) and diastolic (congestive) heart failure: Secondary | ICD-10-CM | POA: Diagnosis present

## 2023-09-26 LAB — BASIC METABOLIC PANEL
Anion gap: 12 (ref 5–15)
BUN: 22 mg/dL (ref 8–23)
CO2: 25 mmol/L (ref 22–32)
Calcium: 9.2 mg/dL (ref 8.9–10.3)
Chloride: 101 mmol/L (ref 98–111)
Creatinine, Ser: 1.35 mg/dL — ABNORMAL HIGH (ref 0.61–1.24)
GFR, Estimated: 59 mL/min — ABNORMAL LOW (ref >60)
Glucose, Bld: 222 mg/dL — ABNORMAL HIGH (ref 70–99)
Potassium: 4.2 mmol/L (ref 3.5–5.1)
Sodium: 138 mmol/L (ref 135–145)

## 2023-10-16 NOTE — Progress Notes (Signed)
 Remote ICD transmission.

## 2023-10-20 ENCOUNTER — Ambulatory Visit: Payer: 59 | Attending: Internal Medicine

## 2023-10-20 ENCOUNTER — Telehealth: Payer: Self-pay

## 2023-10-20 DIAGNOSIS — I5042 Chronic combined systolic (congestive) and diastolic (congestive) heart failure: Secondary | ICD-10-CM | POA: Diagnosis not present

## 2023-10-20 DIAGNOSIS — Z95810 Presence of automatic (implantable) cardiac defibrillator: Secondary | ICD-10-CM

## 2023-10-20 NOTE — Progress Notes (Signed)
 EPIC Encounter for ICM Monitoring  Patient Name: David Bright is a 63 y.o. male Date: 10/20/2023 Primary Care Physican: Patrcia Dolly, DO Primary Cardiologist: Nelson/McLean   Electrophysiologist:  Graciela Husbands 10/04/2022 Weight: 288 lbs 12/10/2022 Weight: 277 lbs 02/21/2023 Weight: 275 lbs 04/09/2023 Weight: 275 lbs 09/15/2022 Weight: 275 lbs          Attempted call to patient and unable to reach.  Left detailed message per DPR regarding transmission.  Transmission results reviewed.          Diet:  2/4 reports he has been eating a lot of restaurant foods over the last couple of months.          Optivol thoracic impedance suggesting possible fluid accumulation starting 2/20.  Fluid index greater than normal threshold starting 2/1.         Prescribed:             Furosemide 40 mg take 1 tablet (40 mg total) by mouth every morning and 0.5 tablet (20 mg total) by mouth every evening. Spironolactone 25 mg take 1 tablet daily   Labs: 09/24/2023 Creatinine 1.35, BUN 22, Potassium 4.2, Sodium 138, GFR 59 08/01/2023 Creatinine 1.38, BUN 27, Potassium 4.4, Sodium 139, GFR 57 05/01/2023 Creatinine 1.12, BUN 21, Potassium 3.6, Sodium 137, GFR >60 12/10/2022 Creatinine 1.12, BUN 18, Potassium 3.4, Sodium 140, GFR>60 A complete set of results can be found in Results Review   Recommendations: Left voice mail with ICM number and encouraged to call if experiencing any fluid symptoms.   Follow-up plan: ICM clinic phone appointment on 10/27/2023 to recheck fluid levels.   91 day device clinic remote transmission 12/04/2023.     EP/Cardiology Office Visits:  12/05/2023 with HF clinic.   Recall 08/16/2023 with Dr Elberta Fortis.   Copy of ICM check sent to Dr. Graciela Husbands.  Will send copy to HF clinic for review if patient is reached.   3 month ICM trend: 10/20/2023.    12-14 Month ICM trend:     [name], RN 10/20/2023 4:20 PM

## 2023-10-20 NOTE — Telephone Encounter (Signed)
 Remote ICM transmission received.  Attempted call to patient regarding ICM remote transmission and left detailed message per DPR.  Left ICM phone number and advised to return call for any fluid symptoms or questions. Next ICM remote transmission scheduled 10/27/2023.

## 2023-10-27 ENCOUNTER — Ambulatory Visit: Attending: Internal Medicine

## 2023-10-27 DIAGNOSIS — Z95810 Presence of automatic (implantable) cardiac defibrillator: Secondary | ICD-10-CM

## 2023-10-27 DIAGNOSIS — I5042 Chronic combined systolic (congestive) and diastolic (congestive) heart failure: Secondary | ICD-10-CM

## 2023-10-29 ENCOUNTER — Telehealth: Payer: Self-pay

## 2023-10-29 NOTE — Progress Notes (Signed)
 EPIC Encounter for ICM Monitoring  Patient Name: David Bright is a 63 y.o. male Date: 10/29/2023 Primary Care Physican: Patrcia Dolly, DO Primary Cardiologist: Nelson/McLean   Electrophysiologist:  Graciela Husbands 10/04/2022 Weight: 288 lbs 12/10/2022 Weight: 277 lbs 02/21/2023 Weight: 275 lbs 04/09/2023 Weight: 275 lbs 09/15/2022 Weight: 275 lbs          Attempted call to patient and unable to reach.  Transmission results reviewed.          Diet:  2/4 reports he has been eating a lot of restaurant foods over the last couple of months.          Optivol thoracic impedance fluid levels returned close to baseline.  Fluid index greater than normal threshold starting 2/1.         Prescribed:             Furosemide 40 mg take 1 tablet (40 mg total) by mouth every morning and 0.5 tablet (20 mg total) by mouth every evening. Spironolactone 25 mg take 1 tablet daily   Labs: 09/24/2023 Creatinine 1.35, BUN 22, Potassium 4.2, Sodium 138, GFR 59 08/01/2023 Creatinine 1.38, BUN 27, Potassium 4.4, Sodium 139, GFR 57 05/01/2023 Creatinine 1.12, BUN 21, Potassium 3.6, Sodium 137, GFR >60 12/10/2022 Creatinine 1.12, BUN 18, Potassium 3.4, Sodium 140, GFR>60 A complete set of results can be found in Results Review   Recommendations:  Unable to reach.     Follow-up plan: ICM clinic phone appointment on 11/24/2023.   91 day device clinic remote transmission 12/04/2023.     EP/Cardiology Office Visits:  12/05/2023 with HF clinic.   Recall 08/16/2023 with Dr Elberta Fortis (yearly).   Copy of ICM check sent to Dr. Graciela Husbands.   3 month ICM trend: 10/27/2023.    12-14 Month ICM trend:     [name], RN 10/29/2023 12:48 PM

## 2023-10-29 NOTE — Progress Notes (Signed)
 Spoke with patient and heart failure questions reviewed.  Transmission results reviewed.  Pt asymptomatic for fluid accumulation.  Reports feeling well at this time and voices no complaints.  He stopped the Lasix evening dose for several weeks and resumed the 20 mg evening dose in the last few days.  Discussed the importance of taking meds as prescribed and not stopping unless directed by MD.  Is not weighing at home.   10/02/2023 Office Weight: 269 lbs No changes and encouraged to call if experiencing any fluid symptoms.

## 2023-10-29 NOTE — Telephone Encounter (Signed)
 Remote ICM transmission received.  Attempted call to patient regarding ICM remote transmission and no answer.

## 2023-11-21 ENCOUNTER — Other Ambulatory Visit (HOSPITAL_COMMUNITY): Payer: Self-pay | Admitting: Cardiology

## 2023-11-24 ENCOUNTER — Ambulatory Visit: Attending: Internal Medicine

## 2023-11-24 DIAGNOSIS — Z95810 Presence of automatic (implantable) cardiac defibrillator: Secondary | ICD-10-CM | POA: Diagnosis not present

## 2023-11-24 DIAGNOSIS — I5042 Chronic combined systolic (congestive) and diastolic (congestive) heart failure: Secondary | ICD-10-CM

## 2023-11-28 NOTE — Progress Notes (Signed)
 EPIC Encounter for ICM Monitoring  Patient Name: David Bright is a 63 y.o. male Date: 11/28/2023 Primary Care Physican: Jacquelyne Matte, DO Primary Cardiologist: Nelson/McLean   Electrophysiologist:  [name] 10/04/2022 Weight: 288 lbs 12/10/2022 Weight: 277 lbs 02/21/2023 Weight: 275 lbs 04/09/2023 Weight: 275 lbs 09/15/2022 Weight: 275 lbs  10/02/2023 Office Weight: 269 lbs         Spoke with patient and heart failure questions reviewed.  Transmission results reviewed.  Pt asymptomatic for fluid accumulation.  Reports feeling well at this time and voices no complaints.           Diet:  2/4 reports he has been eating a lot of restaurant foods over the last couple of months.          Optivol thoracic impedance suggesting levels trending close to normal since 3/24.         Prescribed:             Furosemide  40 mg take 1 tablet (40 mg total) by mouth every morning and 0.5 tablet (20 mg total) by mouth every evening. Spironolactone  25 mg take 1 tablet daily   Labs: 09/24/2023 Creatinine 1.35, BUN 22, Potassium 4.2, Sodium 138, GFR 59 08/01/2023 Creatinine 1.38, BUN 27, Potassium 4.4, Sodium 139, GFR 57 05/01/2023 Creatinine 1.12, BUN 21, Potassium 3.6, Sodium 137, GFR >60 12/10/2022 Creatinine 1.12, BUN 18, Potassium 3.4, Sodium 140, GFR>60 A complete set of results can be found in Results Review   Recommendations:  No changes and encouraged to call if experiencing any fluid symptoms.   Follow-up plan: ICM clinic phone appointment on 12/29/2023.   91 day device clinic remote transmission 12/04/2023.     EP/Cardiology Office Visits:  12/05/2023 with HF clinic.  Advised to call Dr [name] office to schedule yearly appointment.  Recall 08/16/2023 with EP APP (yearly).   Copy of ICM check sent to Dr. [name].   3 month ICM trend: 11/24/2023.    12-14 Month ICM trend:     Almyra Jain, RN 11/28/2023 1:06 PM

## 2023-12-04 ENCOUNTER — Ambulatory Visit (INDEPENDENT_AMBULATORY_CARE_PROVIDER_SITE_OTHER): Payer: 59

## 2023-12-04 DIAGNOSIS — I428 Other cardiomyopathies: Secondary | ICD-10-CM

## 2023-12-04 LAB — CUP PACEART REMOTE DEVICE CHECK
Battery Remaining Longevity: 39 mo
Battery Voltage: 2.96 V
Brady Statistic RV Percent Paced: 0 %
Date Time Interrogation Session: 20250424052508
HighPow Impedance: 69 Ohm
Implantable Lead Connection Status: 753985
Implantable Lead Implant Date: 20160725
Implantable Lead Location: 753860
Implantable Lead Model: 293
Implantable Lead Serial Number: 383593
Implantable Pulse Generator Implant Date: 20160725
Lead Channel Impedance Value: 399 Ohm
Lead Channel Impedance Value: 399 Ohm
Lead Channel Pacing Threshold Amplitude: 0.75 V
Lead Channel Pacing Threshold Pulse Width: 0.4 ms
Lead Channel Sensing Intrinsic Amplitude: 13.75 mV
Lead Channel Sensing Intrinsic Amplitude: 13.75 mV
Lead Channel Setting Pacing Amplitude: 2.5 V
Lead Channel Setting Pacing Pulse Width: 0.4 ms
Lead Channel Setting Sensing Sensitivity: 0.3 mV
Zone Setting Status: 755011

## 2023-12-05 ENCOUNTER — Ambulatory Visit (HOSPITAL_COMMUNITY)
Admission: RE | Admit: 2023-12-05 | Discharge: 2023-12-05 | Disposition: A | Discharge status: Home / Self Care | Payer: 59 | Source: Ambulatory Visit | Attending: Family Medicine | Admitting: Family Medicine

## 2023-12-05 ENCOUNTER — Encounter (HOSPITAL_COMMUNITY): Payer: Self-pay

## 2023-12-05 VITALS — BP 110/60 | HR 85 | Wt 262.5 lb

## 2023-12-05 DIAGNOSIS — Z7984 Long term (current) use of oral hypoglycemic drugs: Secondary | ICD-10-CM | POA: Diagnosis not present

## 2023-12-05 DIAGNOSIS — I5022 Chronic systolic (congestive) heart failure: Secondary | ICD-10-CM | POA: Diagnosis present

## 2023-12-05 DIAGNOSIS — I739 Peripheral vascular disease, unspecified: Secondary | ICD-10-CM | POA: Diagnosis not present

## 2023-12-05 DIAGNOSIS — G4733 Obstructive sleep apnea (adult) (pediatric): Secondary | ICD-10-CM | POA: Insufficient documentation

## 2023-12-05 DIAGNOSIS — Z720 Tobacco use: Secondary | ICD-10-CM | POA: Diagnosis not present

## 2023-12-05 DIAGNOSIS — E669 Obesity, unspecified: Secondary | ICD-10-CM | POA: Insufficient documentation

## 2023-12-05 DIAGNOSIS — Z6835 Body mass index (BMI) 35.0-35.9, adult: Secondary | ICD-10-CM | POA: Diagnosis not present

## 2023-12-05 DIAGNOSIS — J449 Chronic obstructive pulmonary disease, unspecified: Secondary | ICD-10-CM | POA: Diagnosis present

## 2023-12-05 DIAGNOSIS — I130 Hypertensive heart and chronic kidney disease with heart failure and stage 1 through stage 4 chronic kidney disease, or unspecified chronic kidney disease: Secondary | ICD-10-CM | POA: Insufficient documentation

## 2023-12-05 DIAGNOSIS — F17210 Nicotine dependence, cigarettes, uncomplicated: Secondary | ICD-10-CM | POA: Insufficient documentation

## 2023-12-05 DIAGNOSIS — I428 Other cardiomyopathies: Secondary | ICD-10-CM | POA: Diagnosis not present

## 2023-12-05 DIAGNOSIS — Z95810 Presence of automatic (implantable) cardiac defibrillator: Secondary | ICD-10-CM | POA: Diagnosis not present

## 2023-12-05 DIAGNOSIS — Z7982 Long term (current) use of aspirin: Secondary | ICD-10-CM | POA: Diagnosis not present

## 2023-12-05 LAB — BASIC METABOLIC PANEL WITH GFR
Anion gap: 8 (ref 5–15)
BUN: 23 mg/dL (ref 8–23)
CO2: 27 mmol/L (ref 22–32)
Calcium: 9.1 mg/dL (ref 8.9–10.3)
Chloride: 105 mmol/L (ref 98–111)
Creatinine, Ser: 1.34 mg/dL — ABNORMAL HIGH (ref 0.61–1.24)
GFR, Estimated: 59 mL/min — ABNORMAL LOW (ref >60)
Glucose, Bld: 212 mg/dL — ABNORMAL HIGH (ref 70–99)
Potassium: 4.3 mmol/L (ref 3.5–5.1)
Sodium: 140 mmol/L (ref 135–145)

## 2023-12-05 LAB — BRAIN NATRIURETIC PEPTIDE: B Natriuretic Peptide: 20.3 pg/mL (ref 0.0–100.0)

## 2023-12-05 LAB — HEMOGLOBIN A1C
Hgb A1c MFr Bld: 6.9 % — ABNORMAL HIGH (ref 4.8–5.6)
Mean Plasma Glucose: 151.33 mg/dL

## 2023-12-05 NOTE — Patient Instructions (Signed)
 Medication Changes:  No Changes In Medications at this time.   Lab Work:  Labs done today, your results will be available in MyChart, we will contact you for abnormal readings.  Follow-Up in: 4 months with an Echo with Dr. Mclean PLEASE CALL OUR OFFICE AROUND LATE MAY TO GET SCHEDULED FOR YOUR APPOINTMENT. PHONE NUMBER IS [phone] OPTION 2   At the [facility], you and your health needs are our priority. We have a designated team specialized in the treatment of Heart Failure. This Care Team includes your primary Heart Failure Specialized Cardiologist (physician), Advanced Practice Providers (APPs- Physician Assistants and Nurse Practitioners), and Pharmacist who all work together to provide you with the care you need, when you need it.   You may see any of the following providers on your designated Care Team at your next follow up:  Dr. Jules Oar Dr. Peder Bourdon Dr. Alwin Baars Dr. Judyth Nunnery Nieves Bars, NP Ruddy Corral, [facility], [place] Dennise Fitz, NP [place] Lee, NP Luster Salters, PharmD   Please be sure to bring in all your medications bottles to every appointment.   Need to Contact Us :  If you have any questions or concerns before your next appointment please send us  a message through [place] or call our office at [phone].    TO LEAVE A MESSAGE FOR THE NURSE SELECT OPTION 2, PLEASE LEAVE A MESSAGE INCLUDING: YOUR NAME DATE OF BIRTH CALL BACK NUMBER REASON FOR CALL**this is important as we prioritize the call backs  YOU WILL RECEIVE A CALL BACK THE SAME DAY AS LONG AS YOU CALL BEFORE 4:00 PM

## 2023-12-05 NOTE — Progress Notes (Signed)
 ID:  KOTY ANCTIL, DOB 1960-09-01, MRN [id number]   Provider location: [facility]  PCP:  Jacquelyne Matte, DO  HF Cardiology: Dr. Mitzie Anda   HPI: David Bright is a 63 y.o. male who has a history of chronic systolic CHF and COPD/active smoking.  He has a known nonischemic cardiomyopathy, echo in 1/17 with EF 20% and a severely dilated LV.  No coronary disease on 7/16 cardiac cath.  Repeat echo 2/18, showing EF up to 40%.   He had significant PAD by peripheral arterial dopplers, saw Dr. Katheryne Pane with plan of medical management for now. He also has knee pain which is limiting.   Echo 09/2017: EF up to 50-55% with mild LVH. Echo in 1/20 showed EF 55-60%.  Echo in 3/21 showed EF 55-60% with normal RV.  Echo in 11/22 with EF 60-65%, moderate LVH, normal RV.   Echo 4/24 EF 45-50%, mild RV dysfunction, normal IVC.   LE Arterial dopplers 5/24: Rt ABI 0.86, Lt ABI 0.81   Today he returns for HF follow up. Overall feeling fine. He is mildly SOB walking up a flight of steps. Denies palpitations, abnormal bleeding, CP, dizziness, edema, or PND/Orthopnea. Appetite ok. Not weighing at home. Taking all medications. Smoking 10-15 cigs/day. Wears CPAP.  Medtronic device interrogation (personally reviewed from 11/24/23): OptiVol stable, thoracic impedence at reference line  ECG: none ordered today.  Labs (4/23): K 3.4, creatinine 1.12, Hgb A1c 8.0  Labs (7/24): LDL 67 Labs (9/24): BNP 34, K 3.6, creatinine 1.12 Labs (2/25): K 4.2, creatinine 1.35  PMH:  1. Chronic systolic CHF: Nonischemic cardiomyopathy.  Has Medtronic ICD.   - LHC (7/16) with nonobstructive disease.  - Echo (1/17) with EF 20%, severe LV dilation, mild to moderate MR.  - Echo (2/18): EF 40%, mild LV dilation.  - Echo (5/19): EF 50-55%, mild LVH - Echo (1/20): EF 55-60%, mild LVH, normal RV size and systolic function.  - Echo (3/20): EF 50-55%, RV normal, IVC normal - Echo (3/21): EF 55-60%, mild LVH - Echo  (11/22): EF 60-65%, moderate LVH, normal RV.  - Echo (4/24): EF 45-50%, mild RV dysfunction, normal IVC.  2. COPD: Active smoker.  - PFTs (2/18) with minimal obstruction, mild restriction suggesting a parenchymal process, low DLCO.  - CT chest (6/18): No ILD, +emphysema, lung nodules.  - CT chest (11/19): Emphysema, no ILD, stable nodules 3. Type II diabetes with peripheral neuropathy.  4. OSA on CPAP 5. HTN 6. Hyperlipidemia 7. PAD: ABIs abnormal at outside office.  - Peripheral arterial dopplers (3/18) with > 50% left CIA and bilateral EIA stenosis, 50-74% mid LSFA stenosis.  - ABIs (2/19): 0.85 right, 0.86 left (mild).  - ABIs (5/20): 0.89 right, 0.86 left (mild).  - ABIs (5/21): 0.9 right, 0.84 left (mild) - ABIs (11/22): 0.9 right, 0.84 left (mild) 8. CKD: Stage 3.  9. B12 deficiency.  10. COVID-19 7/22  SH: Lives in [place], works at [employer], smokes 1 ppd.  Rare ETOH.  Remote substance abuse.   FH: Aunt with CHF, brother with ESRD.    Review of systems complete and found to be negative unless listed in HPI.   Current Outpatient Medications  Medication Sig Dispense Refill   aspirin  EC 81 MG tablet Take 1 tablet (81 mg total) by mouth daily. 30 tablet 0   atorvastatin  (LIPITOR) 40 MG tablet Take 1 tablet (40 mg total) by mouth daily. 90 tablet 3   carvedilol  (COREG )  12.5 MG tablet Take 1 tablet (12.5 mg total) by mouth 2 (two) times daily with a meal. 180 tablet 3   dapagliflozin  propanediol (FARXIGA ) 10 MG TABS tablet Take 1 tablet (10 mg total) by mouth daily before breakfast. 30 tablet 6   ENTRESTO  97-103 MG Take 1 tablet by mouth twice daily 180 tablet 0   furosemide  (LASIX ) 40 MG tablet Take 1 tablet (40 mg total) by mouth every morning AND 0.5 tablets (20 mg total) every evening. 135 tablet 2   OZEMPIC, 1 MG/DOSE, 4 MG/3ML SOPN Inject 1 mg into the skin once a week.     sildenafil  (VIAGRA ) 100 MG tablet SMARTSIG:1 Tablet(s) By Mouth Every 3 Days PRN      spironolactone  (ALDACTONE ) 25 MG tablet Take 1 tablet by mouth once daily 90 tablet 3   tamsulosin (FLOMAX) 0.4 MG CAPS capsule Take 0.4 mg by mouth daily.     Vitamin D, Ergocalciferol, (DRISDOL) 1.25 MG (50000 UNIT) CAPS capsule Take 50,000 Units by mouth once a week.     No current facility-administered medications for this encounter.   Wt Readings from Last 3 Encounters:  12/05/23 119.1 kg (262 lb 8 oz)  08/01/23 120.9 kg (266 lb 9.6 oz)  05/01/23 126.5 kg (278 lb 12.8 oz)   BP 110/60   Pulse 85   Wt 119.1 kg (262 lb 8 oz)   SpO2 98%   BMI 35.60 kg/m   Physical Exam: General:  NAD. No resp difficulty, walked into clinic HEENT: Normal Neck: Supple. No JVD. Thick neck Cor: Regular rate & rhythm. No rubs, gallops or murmurs. Lungs: Clear, diminished in bases Abdomen: Soft, obese, nontender, nondistended.  Extremities: No cyanosis, clubbing, rash, edema Neuro: Alert & oriented x 3, moves all 4 extremities w/o difficulty. Affect pleasant.  Assessment/Plan: 1. Chronic systolic CHF: Nonischemic cardiomyopathy => ?due to viral myocarditis or prior substance abuse.  Medtronic ICD.  Echo 1/17 with severely dilated LV and EF 20%, EF up to 40% on echo 2/18.  Echo in 1/20 showed EF up to 55% with normal RV, and echo in 3/21 and again in 11/22 showed EF 60%. Echo 4/24 showed mildly decreased EF 45-50%, normal RV. NYHA class II symptoms, not volume overloaded on exam or by Optivol.  - Continue Lasix  40/20. BMET and BNP today. - Continue spironolactone  25 mg daily. - Continue Entresto  97/103 MG bid. - Continue dapagliflozin  10 MG daily.  - Continue Coreg  12.5 mg bid. No BP room to increase.  - Update echo next visit 2. Smoking/suspect COPD:  PFTs done, somewhat concerning for interstitial lung disease rather than emphysema with restrictive PFTs and decreased DLCO => no ILD on CT, emphysema noted along with lung nodules. Most recent repeat CT in 11/19 showed stable nodules, do not need repeat.  He continues to smoke 10-15 cigs/day.  Failed Wellbutrin  and Chantix  x 2.  - Discussed cessation. 3. OSA: reports full compliance w/ CPAP.  4. PAD: Significant disease on peripheral dopplers. He saw Dr. Katheryne Pane, medical management recommended.  ABIs in 11/22 mildly reduced.  ABIs 5/24 were stable with Rt ABI 0.86, Lt ABI 0.81. - Encouraged walking routine  - Continue ASA 81 and statin.  Lipids controlled 7/24 (LDL 67).  5. Obesity: Body mass index is 35.6 kg/m. - On semaglutide. He is requesting A1C today, will arrange and send to endocrinology.  Follow up in 4 months with Dr. Mitzie Anda + echo.  Signed, Elmarie Hacking, FNP  12/05/2023  [facility]  [facility] [address] [facility] [place] [address] [phone] (office) [phone] (fax)

## 2023-12-07 ENCOUNTER — Other Ambulatory Visit (HOSPITAL_COMMUNITY): Payer: Self-pay | Admitting: Cardiology

## 2023-12-07 DIAGNOSIS — E785 Hyperlipidemia, unspecified: Secondary | ICD-10-CM

## 2023-12-07 DIAGNOSIS — I5042 Chronic combined systolic (congestive) and diastolic (congestive) heart failure: Secondary | ICD-10-CM

## 2023-12-28 ENCOUNTER — Other Ambulatory Visit (HOSPITAL_COMMUNITY): Payer: Self-pay | Admitting: Cardiology

## 2023-12-29 ENCOUNTER — Ambulatory Visit: Attending: Cardiovascular Disease

## 2023-12-29 DIAGNOSIS — Z95810 Presence of automatic (implantable) cardiac defibrillator: Secondary | ICD-10-CM | POA: Diagnosis not present

## 2023-12-29 DIAGNOSIS — I5022 Chronic systolic (congestive) heart failure: Secondary | ICD-10-CM | POA: Diagnosis not present

## 2023-12-31 NOTE — Progress Notes (Signed)
 EPIC Encounter for ICM Monitoring  Patient Name: David Bright is a 63 y.o. male Date: 12/31/2023 Primary Care Physican: Jacquelyne Matte, DO Primary Cardiologist: Nelson/McLean   Electrophysiologist:  Mealor 10/04/2022 Weight: 288 lbs 12/10/2022 Weight: 277 lbs 02/21/2023 Weight: 275 lbs 04/09/2023 Weight: 275 lbs 09/15/2022 Weight: 275 lbs  10/02/2023 Office Weight: 269 lbs 12/31/2023 Weight: 268 lbs         Spoke with patient and heart failure questions reviewed.  Transmission results reviewed.  Pt asymptomatic for fluid accumulation.  Reports feeling well at this time and voices no complaints.           Diet:  2/4 reports he has been eating a lot of restaurant foods over the last couple of months.          Optivol thoracic impedance suggesting  normal fluid levels within the last month.         Prescribed:             Furosemide  40 mg take 1 tablet (40 mg total) by mouth every morning and 0.5 tablet (20 mg total) by mouth every evening. Spironolactone  25 mg take 1 tablet daily   Labs: 12/05/2023 Creatinine 1.34, BUN 23, Potassium 4.3, Sodium 140, GFR 59 09/24/2023 Creatinine 1.35, BUN 22, Potassium 4.2, Sodium 138, GFR 59 08/01/2023 Creatinine 1.38, BUN 27, Potassium 4.4, Sodium 139, GFR 57 05/01/2023 Creatinine 1.12, BUN 21, Potassium 3.6, Sodium 137, GFR >60 12/10/2022 Creatinine 1.12, BUN 18, Potassium 3.4, Sodium 140, GFR>60 A complete set of results can be found in Results Review   Recommendations:  No changes and encouraged to call if experiencing any fluid symptoms.   Follow-up plan: ICM clinic phone appointment on 02/23/2024.   91 day device clinic remote transmission 03/04/2024.     EP/Cardiology Office Visits:  04/03/2024 with HF clinic.  Provided EP scheduling office number and advised to call for overdue appt.  Recall 08/16/2023 with EP APP (yearly).   Copy of ICM check sent to Dr. Arlester Ladd.    3 month ICM trend: 12/29/2023.    12-14 Month ICM trend:     Almyra Jain, RN 12/31/2023 3:44 PM

## 2024-01-04 ENCOUNTER — Other Ambulatory Visit (HOSPITAL_COMMUNITY): Payer: Self-pay | Admitting: Cardiology

## 2024-01-14 NOTE — Progress Notes (Signed)
 Remote ICD transmission.

## 2024-01-19 ENCOUNTER — Other Ambulatory Visit (HOSPITAL_COMMUNITY): Payer: Self-pay | Admitting: Cardiology

## 2024-01-21 NOTE — Progress Notes (Signed)
  Electrophysiology Office Note:   Date:  01/22/2024  ID:  Barrington Bores, DOB Mar 13, 1961, MRN [id number]  Primary Cardiologist: Christoper Crafts, MD Primary Heart Failure: Peder Bourdon, MD Electrophysiologist: Efraim Grange, MD       History of Present Illness:   David Bright is a 63 y.o. male with h/o HFrEF, NICM s/p ICD, HTN, PAD, tobacco abuse, COPD, OSA on CPAP, DM II, & polysubstance abuse seen today for routine electrophysiology followup.   Since last being seen in our clinic the patient reports doing well overall. He reports he has lost a significant amt of weight on Ozempic. His waist has gone from a 48 to 42 inches.  He states he is compliant with his medications (previously was not in years past per pt).  He feels well overall.   He denies chest pain, palpitations, dyspnea, PND, orthopnea, nausea, vomiting, dizziness, syncope, edema, weight gain, or early satiety.    Review of systems complete and found to be negative unless listed in HPI.   EP Information / Studies Reviewed:    EKG is not ordered today. EKG from 08/01/2023 reviewed which showed NSR 84 bpm      ICD Interrogation-  reviewed in detail today,  See PACEART report.  Device History: Medtronic Single Chamber ICD implanted 03/06/15 for NICM / HFrEF  History of appropriate therapy: No History of AAD therapy: No   Studies:  ECHO 10/2019 > LVEF 55-60%, G1DD    Risk Assessment/Calculations:              Physical Exam:   VS:  BP 132/68 (BP Location: Right Arm, Patient Position: Sitting, Cuff Size: Large)   Pulse 72   Ht 6' (1.829 m)   Wt 263 lb (119.3 kg)   SpO2 97%   BMI 35.67 kg/m    Wt Readings from Last 3 Encounters:  01/22/24 263 lb (119.3 kg)  12/05/23 262 lb 8 oz (119.1 kg)  08/01/23 266 lb 9.6 oz (120.9 kg)     GEN: Well nourished, well developed in no acute distress NECK: No JVD; No carotid bruits CARDIAC: Regular rate and rhythm, no murmurs, rubs, gallops RESPIRATORY:  Clear to  auscultation without rales, wheezing or rhonchi  ABDOMEN: Soft, non-tender, non-distended EXTREMITIES:  No edema; No deformity   ASSESSMENT AND PLAN:    Chronic Systolic Dysfunction s/p Medtronic single chamber ICD   -euvolemic on exam and by device   -Stable on an appropriate medical regimen -Normal ICD function -See Pace Art report -No programming changes / no treated episodes   OSA  -CPAP compliant   Disposition:   Follow up with Dr. Arlester Ladd or EP APP in 12 months   Signed, Creighton Doffing, NP-C, AGACNP-BC [facility] - Electrophysiology  01/22/2024, 1:30 PM

## 2024-01-22 ENCOUNTER — Ambulatory Visit: Attending: Pulmonary Disease | Admitting: Pulmonary Disease

## 2024-01-22 ENCOUNTER — Encounter: Payer: Self-pay | Admitting: Pulmonary Disease

## 2024-01-22 VITALS — BP 132/68 | HR 72 | Ht 72.0 in | Wt 263.0 lb

## 2024-01-22 DIAGNOSIS — Z95810 Presence of automatic (implantable) cardiac defibrillator: Secondary | ICD-10-CM | POA: Diagnosis not present

## 2024-01-22 DIAGNOSIS — G4733 Obstructive sleep apnea (adult) (pediatric): Secondary | ICD-10-CM

## 2024-01-22 DIAGNOSIS — I5022 Chronic systolic (congestive) heart failure: Secondary | ICD-10-CM

## 2024-01-22 DIAGNOSIS — I428 Other cardiomyopathies: Secondary | ICD-10-CM | POA: Diagnosis not present

## 2024-01-22 LAB — CUP PACEART INCLINIC DEVICE CHECK
Battery Remaining Longevity: 37 mo
Battery Voltage: 2.91 V
Brady Statistic RV Percent Paced: 0 %
Date Time Interrogation Session: 20250612132618
HighPow Impedance: 63 Ohm
Implantable Lead Connection Status: 753985
Implantable Lead Implant Date: 20160725
Implantable Lead Location: 753860
Implantable Lead Model: 293
Implantable Lead Serial Number: 383593
Implantable Pulse Generator Implant Date: 20160725
Lead Channel Impedance Value: 399 Ohm
Lead Channel Impedance Value: 399 Ohm
Lead Channel Pacing Threshold Amplitude: 0.75 V
Lead Channel Pacing Threshold Pulse Width: 0.4 ms
Lead Channel Sensing Intrinsic Amplitude: 11.25 mV
Lead Channel Sensing Intrinsic Amplitude: 14.5 mV
Lead Channel Setting Pacing Amplitude: 2.5 V
Lead Channel Setting Pacing Pulse Width: 0.4 ms
Lead Channel Setting Sensing Sensitivity: 0.3 mV
Zone Setting Status: 755011

## 2024-01-22 NOTE — Patient Instructions (Signed)
 Medication Instructions:  Your physician recommends that you continue on your current medications as directed. Please refer to the Current Medication list given to you today. *If you need a refill on your cardiac medications before your next appointment, please call your pharmacy*  Lab Work: NONE ORDERED If you have labs (blood work) drawn today and your tests are completely normal, you will receive your results only by: MyChart Message (if you have MyChart) OR A paper copy in the mail If you have any lab test that is abnormal or we need to change your treatment, we will call you to review the results.  Testing/Procedures: NONE ORDERED  Follow-Up: At [facility], you and your health needs are our priority.  As part of our continuing mission to provide you with exceptional heart care, our providers are all part of one team.  This team includes your primary Cardiologist (physician) and Advanced Practice Providers or APPs (Physician Assistants and Nurse Practitioners) who all work together to provide you with the care you need, when you need it.  Your next appointment:   12 month(s)  Provider:   Creighton Doffing, NP   We recommend signing up for the patient portal called MyChart.  Sign up information is provided on this After Visit Summary.  MyChart is used to connect with patients for Virtual Visits (Telemedicine).  Patients are able to view lab/test results, encounter notes, upcoming appointments, etc.  Non-urgent messages can be sent to your provider as well.   To learn more about what you can do with MyChart, go to [email or web address].   Other Instructions

## 2024-01-24 ENCOUNTER — Ambulatory Visit: Payer: Self-pay | Admitting: Cardiovascular Disease

## 2024-01-24 ENCOUNTER — Other Ambulatory Visit (HOSPITAL_COMMUNITY): Payer: Self-pay | Admitting: Cardiology

## 2024-02-23 ENCOUNTER — Encounter

## 2024-02-25 NOTE — Progress Notes (Signed)
 No ICM remote transmission received for 02/23/2024 and next ICM transmission scheduled for 03/08/2024.

## 2024-03-06 ENCOUNTER — Other Ambulatory Visit (HOSPITAL_COMMUNITY): Payer: Self-pay | Admitting: Cardiology

## 2024-03-06 DIAGNOSIS — I5042 Chronic combined systolic (congestive) and diastolic (congestive) heart failure: Secondary | ICD-10-CM

## 2024-03-06 DIAGNOSIS — E785 Hyperlipidemia, unspecified: Secondary | ICD-10-CM

## 2024-03-08 ENCOUNTER — Ambulatory Visit: Attending: Cardiovascular Disease

## 2024-03-08 DIAGNOSIS — Z95810 Presence of automatic (implantable) cardiac defibrillator: Secondary | ICD-10-CM

## 2024-03-08 DIAGNOSIS — I5022 Chronic systolic (congestive) heart failure: Secondary | ICD-10-CM | POA: Diagnosis not present

## 2024-03-12 NOTE — Progress Notes (Signed)
 EPIC Encounter for ICM Monitoring  Patient Name: David Bright is a 63 y.o. male Date: 03/12/2024 Primary Care Physican: Billy Toribio Kerns, DO Primary Cardiologist: Nelson/McLean   Electrophysiologist:  Mealor 10/04/2022 Weight: 288 lbs 12/10/2022 Weight: 277 lbs 02/21/2023 Weight: 275 lbs 04/09/2023 Weight: 275 lbs 09/15/2022 Weight: 275 lbs  10/02/2023 Office Weight: 269 lbs 12/31/2023 Weight: 268 lbs 03/12/2024 Weight: 260 lbs         Spoke with patient and heart failure questions reviewed.  Transmission results reviewed.  Pt asymptomatic for fluid accumulation.  He was traveling during decreased impedance.   The 1/2 Lasix  tablet has been decreased by his kidney MD and can take as needed.           Diet:  2/4 reports he has been eating a lot of restaurant foods over the last couple of months.          Optivol thoracic impedance suggesting possible fluid accumulation from 7/12-7/21 and returned 7/26.         Prescribed:             Furosemide  40 mg take 1 tablet (40 mg total) by mouth every morning and 0.5 tablet (20 mg total) by mouth every evening. Spironolactone  25 mg take 1 tablet daily   Labs: 02/05/2024 Creatinine 1.24, BUN 28, Potassium 4.5, Sodium 138, GFR 65 12/05/2023 Creatinine 1.34, BUN 23, Potassium 4.3, Sodium 140, GFR 59 09/24/2023 Creatinine 1.35, BUN 22, Potassium 4.2, Sodium 138, GFR 59 08/01/2023 Creatinine 1.38, BUN 27, Potassium 4.4, Sodium 139, GFR 57 05/01/2023 Creatinine 1.12, BUN 21, Potassium 3.6, Sodium 137, GFR >60 12/10/2022 Creatinine 1.12, BUN 18, Potassium 3.4, Sodium 140, GFR>60 A complete set of results can be found in Results Review   Recommendations:  Advised to add 0.5 Lasix  tablet to morning dose for 2 days and will recheck fluid levels.   Follow-up plan: ICM clinic phone appointment on 03/15/2024 (manual) to recheck fluid levels.   91 day device clinic remote transmission 03/04/2024.     EP/Cardiology Office Visits:  04/03/2024 with HF clinic.    Recall 01/16/2025 with EP APP (yearly).   Copy of ICM check sent to Dr. Nancey.    3 month ICM trend: 03/08/2024.    12-14 Month ICM trend:     Mitzie GORMAN Garner, RN 03/12/2024 3:50 PM

## 2024-03-15 ENCOUNTER — Telehealth: Payer: Self-pay

## 2024-03-15 ENCOUNTER — Encounter

## 2024-03-15 NOTE — Telephone Encounter (Signed)
 Spoke with patient to assist with sending manual remote transmission to recheck fluid levels.  He spoke with [facility] Customer [employer] and will be receiving a new hand held monitor piece in 7-10 days.  Will reschedule remote for 8/18 to allow time to receive the new monitor piece.  He is feeling well and has no fluid sx.

## 2024-03-29 ENCOUNTER — Ambulatory Visit: Attending: Cardiovascular Disease

## 2024-03-29 DIAGNOSIS — I5022 Chronic systolic (congestive) heart failure: Secondary | ICD-10-CM

## 2024-03-29 DIAGNOSIS — Z95810 Presence of automatic (implantable) cardiac defibrillator: Secondary | ICD-10-CM

## 2024-04-02 ENCOUNTER — Telehealth: Payer: Self-pay

## 2024-04-02 NOTE — Telephone Encounter (Signed)
 Remote ICM transmission received.  Attempted call to patient regarding ICM remote transmission and no answer.

## 2024-04-02 NOTE — Progress Notes (Signed)
 EPIC Encounter for ICM Monitoring  Patient Name: David Bright is a 63 y.o. male Date: 04/02/2024 Primary Care Physican: Billy Toribio Kerns, DO Primary Cardiologist: Nelson/McLean   Electrophysiologist:  Mealor 10/04/2022 Weight: 288 lbs 12/10/2022 Weight: 277 lbs 02/21/2023 Weight: 275 lbs 04/09/2023 Weight: 275 lbs 09/15/2022 Weight: 275 lbs  10/02/2023 Office Weight: 269 lbs 12/31/2023 Weight: 268 lbs 03/12/2024 Weight: 260 lbs         Attempted call to patient and unable to reach.    Transmission results reviewed.          Diet:  2/4 reports he has been eating a lot of restaurant foods over the last couple of months.          Optivol thoracic impedance suggesting possible fluid accumulation from 7/26-8/10.         Prescribed:             Furosemide  40 mg take 1 tablet (40 mg total) by mouth every morning and 0.5 tablet (20 mg total) by mouth every evening.  Per pt Nephrologist instructed him to take afternoon 20 mg PRN instead of daily. Spironolactone  25 mg take 1 tablet daily   Labs: 02/05/2024 Creatinine 1.24, BUN 28, Potassium 4.5, Sodium 138, GFR 65 12/05/2023 Creatinine 1.34, BUN 23, Potassium 4.3, Sodium 140, GFR 59 09/24/2023 Creatinine 1.35, BUN 22, Potassium 4.2, Sodium 138, GFR 59 08/01/2023 Creatinine 1.38, BUN 27, Potassium 4.4, Sodium 139, GFR 57 05/01/2023 Creatinine 1.12, BUN 21, Potassium 3.6, Sodium 137, GFR >60 12/10/2022 Creatinine 1.12, BUN 18, Potassium 3.4, Sodium 140, GFR>60 A complete set of results can be found in Results Review   Recommendations:  Unable to reach.     Follow-up plan: ICM clinic phone appointment on 04/19/2024.   91 day device clinic remote transmission 06/03/2024.     EP/Cardiology Office Visits:  Recall 04/03/2024 with Dr Rolan (4 month w/echo).   Recall 01/16/2025 with EP APP (yearly).   Copy of ICM check sent to Dr. Nancey.    3 month ICM trend: 03/29/2024.    12-14 Month ICM trend:     Mitzie GORMAN Garner, RN 04/02/2024 2:01 PM

## 2024-04-19 ENCOUNTER — Ambulatory Visit: Attending: Cardiovascular Disease

## 2024-04-19 DIAGNOSIS — I5022 Chronic systolic (congestive) heart failure: Secondary | ICD-10-CM | POA: Diagnosis not present

## 2024-04-19 DIAGNOSIS — Z95810 Presence of automatic (implantable) cardiac defibrillator: Secondary | ICD-10-CM

## 2024-04-23 NOTE — Progress Notes (Signed)
 EPIC Encounter for ICM Monitoring  Patient Name: David Bright is a 63 y.o. male Date: 04/23/2024 Primary Care Physican: Billy Toribio Kerns, DO Primary Cardiologist: Nelson/McLean   Electrophysiologist:  Mealor 10/04/2022 Weight: 288 lbs 12/10/2022 Weight: 277 lbs 02/21/2023 Weight: 275 lbs 04/09/2023 Weight: 275 lbs 09/15/2022 Weight: 275 lbs  10/02/2023 Office Weight: 269 lbs 12/31/2023 Weight: 268 lbs 03/12/2024 Weight: 260 lbs 04/23/2024 Weight: 250 lbs         Spoke with patient and heart failure questions reviewed.  Transmission results reviewed.  Pt asymptomatic for fluid accumulation.  Reports feeling well at this time and voices no complaints.           Diet:  2/4 reports he has been eating a lot of restaurant foods over the last couple of months.          Optivol thoracic impedance suggesting suggesting normal fluid levels since 8/19.         Prescribed:             Furosemide  40 mg take 1 tablet (40 mg total) by mouth every morning and 0.5 tablet (20 mg total) by mouth every evening.  Per pt Nephrologist instructed him to take afternoon 20 mg PRN instead of daily. Spironolactone  25 mg take 1 tablet daily   Labs: 02/05/2024 Creatinine 1.24, BUN 28, Potassium 4.5, Sodium 138, GFR 65 12/05/2023 Creatinine 1.34, BUN 23, Potassium 4.3, Sodium 140, GFR 59 09/24/2023 Creatinine 1.35, BUN 22, Potassium 4.2, Sodium 138, GFR 59 08/01/2023 Creatinine 1.38, BUN 27, Potassium 4.4, Sodium 139, GFR 57 05/01/2023 Creatinine 1.12, BUN 21, Potassium 3.6, Sodium 137, GFR >60 12/10/2022 Creatinine 1.12, BUN 18, Potassium 3.4, Sodium 140, GFR>60 A complete set of results can be found in Results Review   Recommendations:  No changes and encouraged to call if experiencing any fluid symptoms.   Follow-up plan: ICM clinic phone appointment on 05/24/2024.   91 day device clinic remote transmission 06/03/2024.     EP/Cardiology Office Visits: Advised to call Dr Orvilla office for overdue appt. Recall  04/03/2024 with Dr Rolan (4 month w/echo).   Recall 01/16/2025 with EP APP (yearly).   Copy of ICM check sent to Dr. Nancey.    3 month ICM trend: 04/19/2024.    12-14 Month ICM trend:     Mitzie GORMAN Garner, RN 04/23/2024 11:45 AM

## 2024-05-24 ENCOUNTER — Ambulatory Visit: Attending: Cardiovascular Disease

## 2024-05-24 DIAGNOSIS — I5022 Chronic systolic (congestive) heart failure: Secondary | ICD-10-CM

## 2024-05-24 DIAGNOSIS — Z95810 Presence of automatic (implantable) cardiac defibrillator: Secondary | ICD-10-CM

## 2024-05-28 ENCOUNTER — Telehealth: Payer: Self-pay

## 2024-05-28 NOTE — Progress Notes (Signed)
 EPIC Encounter for ICM Monitoring  Patient Name: David Bright is a 63 y.o. male Date: 05/28/2024 Primary Care Physican: Billy Toribio Kerns, DO Primary Cardiologist: Nelson/McLean   Electrophysiologist:  Mealor 10/04/2022 Weight: 288 lbs 12/10/2022 Weight: 277 lbs 02/21/2023 Weight: 275 lbs 04/09/2023 Weight: 275 lbs 09/15/2022 Weight: 275 lbs  10/02/2023 Office Weight: 269 lbs 12/31/2023 Weight: 268 lbs 03/12/2024 Weight: 260 lbs 04/23/2024 Weight: 250 lbs         Attempted call to patient and unable to reach.  Left detailed message per DPR regarding transmission.  Transmission results reviewed.            Diet:  No updates         Since 04/19/2024 ICM Remote Transmission: Optivol thoracic impedance suggesting normal fluid levels.         Prescribed:             Furosemide  40 mg take 1 tablet (40 mg total) by mouth every morning and 0.5 tablet (20 mg total) by mouth every evening.  Per pt Nephrologist instructed him to take afternoon 20 mg PRN instead of daily. Spironolactone  25 mg take 1 tablet daily   Labs: 02/05/2024 Creatinine 1.24, BUN 28, Potassium 4.5, Sodium 138, GFR 65 12/05/2023 Creatinine 1.34, BUN 23, Potassium 4.3, Sodium 140, GFR 59 09/24/2023 Creatinine 1.35, BUN 22, Potassium 4.2, Sodium 138, GFR 59 08/01/2023 Creatinine 1.38, BUN 27, Potassium 4.4, Sodium 139, GFR 57 05/01/2023 Creatinine 1.12, BUN 21, Potassium 3.6, Sodium 137, GFR >60 12/10/2022 Creatinine 1.12, BUN 18, Potassium 3.4, Sodium 140, GFR>60 A complete set of results can be found in Results Review   Recommendations:  Left voice mail with ICM number and encouraged to call if experiencing any fluid symptoms.   Follow-up plan: ICM clinic phone appointment on 06/28/2024.   91 day device clinic remote transmission 06/03/2024.     EP/Cardiology Office Visits:  06/16/2024 with Dr Orvilla office.   Recall 01/16/2025 with EP APP (yearly).   Copy of ICM check sent to Dr. Nancey.    Remote monitoring is medically  necessary for Heart Failure Management.    Daily Thoracic Impedance ICM trend: 02/23/2024 through 05/24/2024.    12-14 Month Thoracic Impedance ICM trend:     Mitzie GORMAN Garner, RN 05/28/2024 3:23 PM

## 2024-05-28 NOTE — Telephone Encounter (Signed)
 Remote ICM transmission received.  Attempted call to patient regarding ICM remote transmission.  Left detailed message per DPR with ICM phone number to return call for any questions, concerns or fluid symptoms.

## 2024-05-30 ENCOUNTER — Other Ambulatory Visit (HOSPITAL_COMMUNITY): Payer: Self-pay | Admitting: Cardiology

## 2024-05-31 ENCOUNTER — Encounter

## 2024-06-03 ENCOUNTER — Ambulatory Visit: Payer: 59

## 2024-06-03 DIAGNOSIS — I5022 Chronic systolic (congestive) heart failure: Secondary | ICD-10-CM

## 2024-06-04 LAB — CUP PACEART REMOTE DEVICE CHECK
Battery Remaining Longevity: 33 mo
Battery Voltage: 2.95 V
Brady Statistic RV Percent Paced: 0.01 %
Date Time Interrogation Session: 20251023022707
HighPow Impedance: 72 Ohm
Implantable Lead Connection Status: 753985
Implantable Lead Implant Date: 20160725
Implantable Lead Location: 753860
Implantable Lead Model: 293
Implantable Lead Serial Number: 383593
Implantable Pulse Generator Implant Date: 20160725
Lead Channel Impedance Value: 437 Ohm
Lead Channel Impedance Value: 437 Ohm
Lead Channel Pacing Threshold Amplitude: 0.875 V
Lead Channel Pacing Threshold Pulse Width: 0.4 ms
Lead Channel Sensing Intrinsic Amplitude: 15 mV
Lead Channel Sensing Intrinsic Amplitude: 15 mV
Lead Channel Setting Pacing Amplitude: 2.5 V
Lead Channel Setting Pacing Pulse Width: 0.4 ms
Lead Channel Setting Sensing Sensitivity: 0.3 mV
Zone Setting Status: 755011

## 2024-06-07 NOTE — Progress Notes (Signed)
 Remote ICD Transmission

## 2024-06-15 ENCOUNTER — Telehealth (HOSPITAL_COMMUNITY): Payer: Self-pay | Admitting: Cardiology

## 2024-06-15 NOTE — Telephone Encounter (Signed)
 Called to confirm/remind patient of their appointment at [facility] on 06/15/24.   Appointment:   [x] Confirmed  [] Left mess   [] No answer/No voice mail  [] VM Full/unable to leave message  [] Phone not in service  Patient reminded to bring all medications and/or complete list.  Confirmed patient has transportation. Gave directions, instructed to utilize valet parking.

## 2024-06-16 ENCOUNTER — Ambulatory Visit (HOSPITAL_COMMUNITY)
Admission: RE | Admit: 2024-06-16 | Discharge: 2024-06-16 | Disposition: A | Source: Ambulatory Visit | Attending: Cardiology | Admitting: Cardiology

## 2024-06-16 ENCOUNTER — Ambulatory Visit: Payer: Self-pay | Admitting: Cardiovascular Disease

## 2024-06-16 ENCOUNTER — Ambulatory Visit (HOSPITAL_COMMUNITY)
Admission: RE | Admit: 2024-06-16 | Discharge: 2024-06-16 | Disposition: A | Discharge status: Home / Self Care | Source: Ambulatory Visit | Attending: Family Medicine | Admitting: Family Medicine

## 2024-06-16 ENCOUNTER — Ambulatory Visit (HOSPITAL_COMMUNITY): Payer: Self-pay | Admitting: Family Medicine

## 2024-06-16 VITALS — BP 116/64 | HR 84 | Wt 261.6 lb

## 2024-06-16 DIAGNOSIS — I77810 Thoracic aortic ectasia: Secondary | ICD-10-CM | POA: Insufficient documentation

## 2024-06-16 DIAGNOSIS — I428 Other cardiomyopathies: Secondary | ICD-10-CM | POA: Diagnosis not present

## 2024-06-16 DIAGNOSIS — I130 Hypertensive heart and chronic kidney disease with heart failure and stage 1 through stage 4 chronic kidney disease, or unspecified chronic kidney disease: Secondary | ICD-10-CM | POA: Insufficient documentation

## 2024-06-16 DIAGNOSIS — E785 Hyperlipidemia, unspecified: Secondary | ICD-10-CM | POA: Insufficient documentation

## 2024-06-16 DIAGNOSIS — I5022 Chronic systolic (congestive) heart failure: Secondary | ICD-10-CM | POA: Diagnosis not present

## 2024-06-16 DIAGNOSIS — R0609 Other forms of dyspnea: Secondary | ICD-10-CM | POA: Diagnosis present

## 2024-06-16 DIAGNOSIS — E669 Obesity, unspecified: Secondary | ICD-10-CM | POA: Insufficient documentation

## 2024-06-16 DIAGNOSIS — G4730 Sleep apnea, unspecified: Secondary | ICD-10-CM | POA: Diagnosis not present

## 2024-06-16 DIAGNOSIS — F17200 Nicotine dependence, unspecified, uncomplicated: Secondary | ICD-10-CM | POA: Insufficient documentation

## 2024-06-16 DIAGNOSIS — I071 Rheumatic tricuspid insufficiency: Secondary | ICD-10-CM | POA: Insufficient documentation

## 2024-06-16 DIAGNOSIS — E1142 Type 2 diabetes mellitus with diabetic polyneuropathy: Secondary | ICD-10-CM | POA: Diagnosis not present

## 2024-06-16 DIAGNOSIS — I429 Cardiomyopathy, unspecified: Secondary | ICD-10-CM | POA: Insufficient documentation

## 2024-06-16 DIAGNOSIS — E1122 Type 2 diabetes mellitus with diabetic chronic kidney disease: Secondary | ICD-10-CM | POA: Insufficient documentation

## 2024-06-16 DIAGNOSIS — Z6835 Body mass index (BMI) 35.0-35.9, adult: Secondary | ICD-10-CM | POA: Insufficient documentation

## 2024-06-16 DIAGNOSIS — Z79899 Other long term (current) drug therapy: Secondary | ICD-10-CM | POA: Insufficient documentation

## 2024-06-16 DIAGNOSIS — I358 Other nonrheumatic aortic valve disorders: Secondary | ICD-10-CM | POA: Diagnosis not present

## 2024-06-16 DIAGNOSIS — J449 Chronic obstructive pulmonary disease, unspecified: Secondary | ICD-10-CM | POA: Insufficient documentation

## 2024-06-16 DIAGNOSIS — Z7982 Long term (current) use of aspirin: Secondary | ICD-10-CM | POA: Insufficient documentation

## 2024-06-16 DIAGNOSIS — F17210 Nicotine dependence, cigarettes, uncomplicated: Secondary | ICD-10-CM | POA: Insufficient documentation

## 2024-06-16 DIAGNOSIS — I509 Heart failure, unspecified: Secondary | ICD-10-CM | POA: Insufficient documentation

## 2024-06-16 DIAGNOSIS — I5042 Chronic combined systolic (congestive) and diastolic (congestive) heart failure: Secondary | ICD-10-CM | POA: Diagnosis not present

## 2024-06-16 DIAGNOSIS — Z7984 Long term (current) use of oral hypoglycemic drugs: Secondary | ICD-10-CM | POA: Insufficient documentation

## 2024-06-16 DIAGNOSIS — Z7985 Long-term (current) use of injectable non-insulin antidiabetic drugs: Secondary | ICD-10-CM | POA: Insufficient documentation

## 2024-06-16 DIAGNOSIS — B3322 Viral myocarditis: Secondary | ICD-10-CM | POA: Insufficient documentation

## 2024-06-16 DIAGNOSIS — I447 Left bundle-branch block, unspecified: Secondary | ICD-10-CM | POA: Diagnosis not present

## 2024-06-16 DIAGNOSIS — I77819 Aortic ectasia, unspecified site: Secondary | ICD-10-CM | POA: Insufficient documentation

## 2024-06-16 DIAGNOSIS — I739 Peripheral vascular disease, unspecified: Secondary | ICD-10-CM | POA: Insufficient documentation

## 2024-06-16 DIAGNOSIS — Z8616 Personal history of COVID-19: Secondary | ICD-10-CM | POA: Insufficient documentation

## 2024-06-16 DIAGNOSIS — J439 Emphysema, unspecified: Secondary | ICD-10-CM | POA: Diagnosis not present

## 2024-06-16 DIAGNOSIS — I444 Left anterior fascicular block: Secondary | ICD-10-CM | POA: Diagnosis not present

## 2024-06-16 LAB — ECHOCARDIOGRAM COMPLETE
AR max vel: 3.36 cm2
AV Area VTI: 3.63 cm2
AV Area mean vel: 3.69 cm2
AV Mean grad: 2 mmHg
AV Peak grad: 4.3 mmHg
Ao pk vel: 1.04 m/s
Area-P 1/2: 5.13 cm2
S' Lateral: 3.9 cm

## 2024-06-16 MED ORDER — VARENICLINE TARTRATE (STARTER) 0.5 MG X 11 & 1 MG X 42 PO TBPK: ORAL_TABLET | ORAL | 0 refills | Status: DC

## 2024-06-16 MED ORDER — FUROSEMIDE 40 MG PO TABS: 40.0000 mg | ORAL_TABLET | Freq: Every day | ORAL | 3 refills | Status: AC

## 2024-06-16 NOTE — Patient Instructions (Addendum)
 TAKE Chantix  as direct on the box.  TAKE Lasix  40 mg daily.  Your provider has ordered ABI's of your legs. You will be called to have this test arranged.  Your physician recommends that you schedule a follow-up appointment in: 4 months.  If you have any questions or concerns before your next appointment please send us  a message through [place] or call our office at [phone].    TO LEAVE A MESSAGE FOR THE NURSE SELECT OPTION 2, PLEASE LEAVE A MESSAGE INCLUDING: YOUR NAME DATE OF BIRTH CALL BACK NUMBER REASON FOR CALL**this is important as we prioritize the call backs  YOU WILL RECEIVE A CALL BACK THE SAME DAY AS LONG AS YOU CALL BEFORE 4:00 PM  At the [facility], you and your health needs are our priority. As part of our continuing mission to provide you with exceptional heart care, we have created designated Provider Care Teams. These Care Teams include your primary Cardiologist (physician) and Advanced Practice Providers (APPs- Physician Assistants and Nurse Practitioners) who all work together to provide you with the care you need, when you need it.   You may see any of the following providers on your designated Care Team at your next follow up: Dr Toribio Fuel Dr Ezra Shuck Dr. Morene Brownie Greig Mosses, NP Caffie Shed, [place] [facility] [place], [place] Beckey Coe, NP Jordan Lee, NP Ellouise Class, NP Tinnie Redman, PharmD Jaun Bash, PharmD   Please be sure to bring in all your medications bottles to every appointment.    Thank you for choosing [facility]

## 2024-06-17 NOTE — Progress Notes (Signed)
 ID:  David Bright, DOB 1960/12/10, MRN [id number]   Provider location: [facility]  PCP:  Billy Toribio Kerns, DO  HF Cardiology: Dr. Rolan  Chief complaint: CHF   HPI: David Bright is a 63 y.o. male who has a history of chronic systolic CHF and COPD/active smoking.  He has a known nonischemic cardiomyopathy, echo in 1/17 with EF 20% and a severely dilated LV.  No coronary disease on 7/16 cardiac cath.  Repeat echo 2/18, showing EF up to 40%.   He had significant PAD by peripheral arterial dopplers, saw Dr. Court with plan of medical management for now. He also has knee pain which is limiting.   Echo 09/2017: EF up to 50-55% with mild LVH. Echo in 1/20 showed EF 55-60%.  Echo in 3/21 showed EF 55-60% with normal RV.  Echo in 11/22 with EF 60-65%, moderate LVH, normal RV.   Echo 4/24 EF 45-50%, mild RV dysfunction, normal IVC.   LE Arterial dopplers in 5/24 showed Rt ABI 0.86, Lt ABI 0.81   Echo was done today and reviewed, EF 50-55% with mild LVH, normal RV size and systolic function, normal IVC.   Today he returns for HF follow up. Weight down 1 lb. Reports poor energy but denies exertional dyspnea or chest pain.  Legs fatigue with stairs and when he is carrying a heavy load, but no frank claudication.  No pedal ulcerations.  No lightheadedness or palpitations. Still smoking 1 ppd. Using CPAP at night.   Medtronic device interrogation: No VT/AF, stable thoracic impedance.   ECG (personally reviewed): NSR, LAFB  Labs (4/23): K 3.4, creatinine 1.12, Hgb A1c 8.0  Labs (7/24): LDL 67 Labs (9/24): BNP 34, K 3.6, creatinine 1.12 Labs (2/25): K 4.2, creatinine 1.35 Labs (10/25): K 4.1, creatinine 1.1, total cholesterol 127, TGs 90  PMH:  1. Chronic systolic CHF: Nonischemic cardiomyopathy.  Has Medtronic ICD.   - LHC (7/16) with nonobstructive disease.  - Echo (1/17) with EF 20%, severe LV dilation, mild to moderate MR.  - Echo (2/18): EF 40%, mild LV  dilation.  - Echo (5/19): EF 50-55%, mild LVH - Echo (1/20): EF 55-60%, mild LVH, normal RV size and systolic function.  - Echo (3/20): EF 50-55%, RV normal, IVC normal - Echo (3/21): EF 55-60%, mild LVH - Echo (11/22): EF 60-65%, moderate LVH, normal RV.  - Echo (4/24): EF 45-50%, mild RV dysfunction, normal IVC.  - Echo (11/25): EF 50-55% with mild LVH, normal RV size and systolic function, normal IVC.  2. COPD: Active smoker.  - PFTs (2/18) with minimal obstruction, mild restriction suggesting a parenchymal process, low DLCO.  - CT chest (6/18): No ILD, +emphysema, lung nodules.  - CT chest (11/19): Emphysema, no ILD, stable nodules 3. Type II diabetes with peripheral neuropathy.  4. OSA on CPAP 5. HTN 6. Hyperlipidemia 7. PAD: ABIs abnormal at outside office.  - Peripheral arterial dopplers (3/18) with > 50% left CIA and bilateral EIA stenosis, 50-74% mid LSFA stenosis.  - ABIs (2/19): 0.85 right, 0.86 left (mild).  - ABIs (5/20): 0.89 right, 0.86 left (mild).  - ABIs (5/21): 0.9 right, 0.84 left (mild) - ABIs (11/22): 0.9 right, 0.84 left (mild) - ABIs (5/24): Rt ABI 0.86, Lt ABI 0.81 (mild) 8. CKD: Stage 3.  9. B12 deficiency.  10. COVID-19 7/22  SH: Lives in [place], works at [employer], smokes 1 ppd.  Rare ETOH.  Remote substance abuse.   FH:  Aunt with CHF, brother with ESRD.    Review of systems complete and found to be negative unless listed in HPI.   Current Outpatient Medications  Medication Sig Dispense Refill   Albuterol -Budesonide 90-80 MCG/ACT AERO Inhale 1 puff into the lungs daily.     aspirin  EC 81 MG tablet Take 1 tablet (81 mg total) by mouth daily. 30 tablet 0   atorvastatin  (LIPITOR) 40 MG tablet Take 1 tablet by mouth once daily 90 tablet 3   carvedilol  (COREG ) 12.5 MG tablet TAKE 1 TABLET BY MOUTH TWICE DAILY WITH A MEAL 180 tablet 3   dapagliflozin  propanediol (FARXIGA ) 10 MG TABS tablet Take 1 tablet (10 mg total) by mouth daily before  breakfast. 30 tablet 6   OZEMPIC, 1 MG/DOSE, 4 MG/3ML SOPN Inject 1 mg into the skin once a week.     sacubitril -valsartan  (ENTRESTO ) 97-103 MG Take 1 tablet by mouth 2 (two) times daily. PLEASE SCHEDULE APPOINTMENT FOR MORE REFILLS 180 tablet 0   sildenafil  (VIAGRA ) 100 MG tablet SMARTSIG:1 Tablet(s) By Mouth Every 3 Days PRN     spironolactone  (ALDACTONE ) 25 MG tablet Take 1 tablet by mouth once daily 90 tablet 3   tamsulosin (FLOMAX) 0.4 MG CAPS capsule Take 0.4 mg by mouth daily.     Varenicline  Tartrate, Starter, (CHANTIX  STARTING MONTH PAK) 0.5 MG X 11 & 1 MG X 42 TBPK Day 1-3 .5mg  daily, Day 4-7 .5 mg Twice daily, Day 8 onwards 1 mg Twice daily 53 each 0   Vitamin D, Ergocalciferol, (DRISDOL) 1.25 MG (50000 UNIT) CAPS capsule Take 50,000 Units by mouth once a week.     furosemide  (LASIX ) 40 MG tablet Take 1 tablet (40 mg total) by mouth daily. 90 tablet 3   No current facility-administered medications for this encounter.   Wt Readings from Last 3 Encounters:  06/16/24 118.7 kg (261 lb 9.6 oz)  01/22/24 119.3 kg (263 lb)  12/05/23 119.1 kg (262 lb 8 oz)   BP 116/64   Pulse 84   Wt 118.7 kg (261 lb 9.6 oz)   SpO2 97%   BMI 35.48 kg/m  General: NAD Neck: No JVD, no thyromegaly or thyroid nodule.  Lungs: Clear to auscultation bilaterally with normal respiratory effort. CV: Nondisplaced PMI.  Heart regular S1/S2, no S3/S4, no murmur.  No peripheral edema.  No carotid bruit.  Difficult to palpate pedal pulses.  Abdomen: Soft, nontender, no hepatosplenomegaly, no distention.  Skin: Intact without lesions or rashes.  Neurologic: Alert and oriented x 3.  Psych: Normal affect. Extremities: No clubbing or cyanosis.  HEENT: Normal.   Assessment/Plan: 1. Chronic systolic CHF: Nonischemic cardiomyopathy => ?due to viral myocarditis or prior substance abuse.  Medtronic ICD.  Echo 1/17 with severely dilated LV and EF 20%, EF up to 40% on echo 2/18.  Echo in 1/20 showed EF up to 55% with  normal RV, and echo in 3/21 and again in 11/22 showed EF 60%. Echo 4/24 showed mildly decreased EF 45-50%, normal RV. Echo today showed EF 50-55% with mild LVH, normal RV size and systolic function, normal IVC.  NYHA class II, not volume overloaded on exam or Optivol.  - Continue Lasix  40 mg daily. BMET and BNP today. - Continue spironolactone  25 mg daily. - Continue Entresto  97/103 MG bid. - Continue dapagliflozin  10 MG daily.  - Continue Coreg  12.5 mg bid.  2. Smoking/suspect COPD:  PFTs done, somewhat concerning for interstitial lung disease rather than emphysema with restrictive PFTs and decreased  DLCO => no ILD on CT, emphysema noted along with lung nodules. Most recent repeat CT in 11/19 showed stable nodules, do not need repeat. He continues to smoke about 1 ppd.  Failed Wellbutrin  and Chantix  x 2.  - Discussed cessation. Wants to try Chantix  again.  I will give prescription.  3. OSA: reports full compliance w/ CPAP.  4. PAD: Significant disease on peripheral dopplers. He saw Dr. Court, medical management recommended.  ABIs in 11/22 mildly reduced.  ABIs 5/24 were stable with Rt ABI 0.86, Lt ABI 0.81. - Encouraged walking routine  - Continue ASA 81 and statin, good lipids in 10/25.  - I will arrange for ABIs this year.  5. Obesity: Body mass index is 35.48 kg/m. - On semaglutide. Slow weight loss.   Followup 4 months with APP.   I spent 31 minutes reviewing records, interviewing/examining patient, and managing orders.   Signed, Ezra Shuck, MD  06/17/2024  [facility] [address] [facility] [place] [address] [phone] (office) [phone] (fax)

## 2024-06-28 ENCOUNTER — Ambulatory Visit: Attending: Cardiovascular Disease

## 2024-06-28 DIAGNOSIS — I5042 Chronic combined systolic (congestive) and diastolic (congestive) heart failure: Secondary | ICD-10-CM

## 2024-06-28 DIAGNOSIS — Z95810 Presence of automatic (implantable) cardiac defibrillator: Secondary | ICD-10-CM

## 2024-06-30 NOTE — Progress Notes (Signed)
 EPIC Encounter for ICM Monitoring  Patient Name: David Bright is a 63 y.o. male Date: 06/30/2024 Primary Care Physican: Billy Toribio Kerns, DO Primary Cardiologist: Nelson/McLean   Electrophysiologist:  Mealor 10/04/2022 Weight: 288 lbs 12/10/2022 Weight: 277 lbs 02/21/2023 Weight: 275 lbs 04/09/2023 Weight: 275 lbs 09/15/2022 Weight: 275 lbs  10/02/2023 Office Weight: 269 lbs 12/31/2023 Weight: 268 lbs 03/12/2024 Weight: 260 lbs 04/23/2024 Weight: 250 lbs         Spoke with patient and heart failure questions reviewed.  Transmission results reviewed.  Pt asymptomatic for fluid accumulation.  Reports feeling well at this time and voices no complaints.             Diet:  No updates         Since 05/24/2024 ICM Remote Transmission: Optivol thoracic impedance suggesting possible fluid accumulation starting 06/11/2024 and trending back to baseline.         Prescribed:             Furosemide  40 mg take 1 tablet (40 mg total) by mouth.   Spironolactone  25 mg take 1 tablet daily   Labs: 06/11/2024 Creatinine 1.1,   BUN 23, Potassium 4.1, Sodium 140, GFR 75 02/05/2024 Creatinine 1.24, BUN 28, Potassium 4.5, Sodium 138, GFR 65 12/05/2023 Creatinine 1.34, BUN 23, Potassium 4.3, Sodium 140, GFR 59 09/24/2023 Creatinine 1.35, BUN 22, Potassium 4.2, Sodium 138, GFR 59 08/01/2023 Creatinine 1.38, BUN 27, Potassium 4.4, Sodium 139, GFR 57 05/01/2023 Creatinine 1.12, BUN 21, Potassium 3.6, Sodium 137, GFR >60 12/10/2022 Creatinine 1.12, BUN 18, Potassium 3.4, Sodium 140, GFR>60 A complete set of results can be found in Results Review   Recommendations:  Recommendation to limit salt intake to 2000 mg daily and fluid intake to 64 oz daily.  Encouraged to call if experiencing any fluid symptoms.    Follow-up plan: ICM clinic phone appointment on 08/09/2024.   91 day device clinic remote transmission 09/03/2023.     EP/Cardiology Office Visits:   Recall 01/16/2025 with EP APP (yearly).   Copy of ICM  check sent to Dr. Nancey.    Remote monitoring is medically necessary for Heart Failure Management.    Daily Thoracic Impedance ICM trend: 03/29/2024 through 06/28/2024.    12-14 Month Thoracic Impedance ICM trend:     Mitzie GORMAN Garner, RN 06/30/2024 3:02 PM

## 2024-07-01 ENCOUNTER — Ambulatory Visit (HOSPITAL_COMMUNITY)
Admission: RE | Admit: 2024-07-01 | Discharge: 2024-07-01 | Disposition: A | Discharge status: Home / Self Care | Source: Ambulatory Visit | Attending: Cardiology | Admitting: Cardiology

## 2024-07-01 ENCOUNTER — Encounter: Payer: Self-pay | Admitting: Cardiology

## 2024-07-01 ENCOUNTER — Ambulatory Visit (HOSPITAL_COMMUNITY): Payer: Self-pay | Admitting: Cardiology

## 2024-07-01 DIAGNOSIS — I739 Peripheral vascular disease, unspecified: Secondary | ICD-10-CM | POA: Insufficient documentation

## 2024-07-01 DIAGNOSIS — I5042 Chronic combined systolic (congestive) and diastolic (congestive) heart failure: Secondary | ICD-10-CM | POA: Diagnosis present

## 2024-07-01 LAB — VAS US ABI WITH/WO TBI
Left ABI: 0.86
Right ABI: 0.8

## 2024-07-05 NOTE — Telephone Encounter (Signed)
 PT AWARE

## 2024-07-20 ENCOUNTER — Other Ambulatory Visit (HOSPITAL_COMMUNITY): Payer: Self-pay | Admitting: Cardiology

## 2024-08-09 ENCOUNTER — Ambulatory Visit: Attending: Cardiovascular Disease

## 2024-08-09 DIAGNOSIS — Z95810 Presence of automatic (implantable) cardiac defibrillator: Secondary | ICD-10-CM | POA: Diagnosis not present

## 2024-08-09 DIAGNOSIS — I5042 Chronic combined systolic (congestive) and diastolic (congestive) heart failure: Secondary | ICD-10-CM | POA: Diagnosis not present

## 2024-08-11 NOTE — Progress Notes (Signed)
 EPIC Encounter for ICM Monitoring  Patient Name: David Bright is a 63 y.o. male Date: 08/11/2024 Primary Care Physican: Billy Toribio Kerns, DO Primary Cardiologist: Nelson/McLean   Electrophysiologist:  Mealor 10/04/2022 Weight: 288 lbs 12/10/2022 Weight: 277 lbs 02/21/2023 Weight: 275 lbs 04/09/2023 Weight: 275 lbs 09/15/2022 Weight: 275 lbs  10/02/2023 Office Weight: 269 lbs 12/31/2023 Weight: 268 lbs 03/12/2024 Weight: 260 lbs 04/23/2024 Weight: 250 lbs         Spoke with patient and heart failure questions reviewed.  Transmission results reviewed.  Pt asymptomatic for fluid accumulation.  Reports feeling well at this time and voices no complaints.              Diet:  No updates         Since 06/28/2024 ICM Remote Transmission: Optivol thoracic impedance suggesting normal fluid levels with the exception of possible fluid accumulation from 07/12/2024-07/18/2024.         Prescribed:             Furosemide  40 mg take 1 tablet (40 mg total) by mouth.   Spironolactone  25 mg take 1 tablet daily   Labs: 06/11/2024 Creatinine 1.1,   BUN 23, Potassium 4.1, Sodium 140, GFR 75 02/05/2024 Creatinine 1.24, BUN 28, Potassium 4.5, Sodium 138, GFR 65 12/05/2023 Creatinine 1.34, BUN 23, Potassium 4.3, Sodium 140, GFR 59 09/24/2023 Creatinine 1.35, BUN 22, Potassium 4.2, Sodium 138, GFR 59 08/01/2023 Creatinine 1.38, BUN 27, Potassium 4.4, Sodium 139, GFR 57 05/01/2023 Creatinine 1.12, BUN 21, Potassium 3.6, Sodium 137, GFR >60 12/10/2022 Creatinine 1.12, BUN 18, Potassium 3.4, Sodium 140, GFR>60 A complete set of results can be found in Results Review   Recommendations:  No changes and encouraged to call if experiencing any fluid symptoms.   Follow-up plan: ICM clinic phone appointment on 09/13/2024.   91 day device clinic remote transmission 09/02/2024.     EP/Cardiology Office Visits:   Recall 01/16/2025 with EP APP (yearly).   Copy of ICM check sent to Dr. Nancey.    Remote monitoring is  medically necessary for Heart Failure Management.    Daily Thoracic Impedance ICM trend: 05/10/2024 through 08/09/2024.    12-14 Month Thoracic Impedance ICM trend:     Mitzie GORMAN Garner, RN 08/11/2024 11:08 AM

## 2024-09-01 ENCOUNTER — Other Ambulatory Visit (HOSPITAL_COMMUNITY): Payer: Self-pay | Admitting: Cardiology

## 2024-09-02 ENCOUNTER — Ambulatory Visit: Payer: 59

## 2024-09-02 DIAGNOSIS — I5042 Chronic combined systolic (congestive) and diastolic (congestive) heart failure: Secondary | ICD-10-CM | POA: Diagnosis not present

## 2024-09-03 LAB — CUP PACEART REMOTE DEVICE CHECK
Battery Remaining Longevity: 31 mo
Battery Voltage: 2.95 V
Brady Statistic RV Percent Paced: 0 %
Date Time Interrogation Session: 20260122022603
HighPow Impedance: 70 Ohm
Implantable Lead Connection Status: 753985
Implantable Lead Implant Date: 20160725
Implantable Lead Location: 753860
Implantable Lead Model: 293
Implantable Lead Serial Number: 383593
Implantable Pulse Generator Implant Date: 20160725
Lead Channel Impedance Value: 399 Ohm
Lead Channel Impedance Value: 437 Ohm
Lead Channel Pacing Threshold Amplitude: 0.875 V
Lead Channel Pacing Threshold Pulse Width: 0.4 ms
Lead Channel Sensing Intrinsic Amplitude: 16.375 mV
Lead Channel Sensing Intrinsic Amplitude: 16.375 mV
Lead Channel Setting Pacing Amplitude: 2.5 V
Lead Channel Setting Pacing Pulse Width: 0.4 ms
Lead Channel Setting Sensing Sensitivity: 0.3 mV
Zone Setting Status: 755011

## 2024-09-06 NOTE — Progress Notes (Signed)
 Remote ICD Transmission

## 2024-09-07 ENCOUNTER — Ambulatory Visit: Payer: Self-pay | Admitting: Cardiovascular Disease

## 2024-09-09 NOTE — Progress Notes (Signed)
 31 day ICM Remote transmission canceled due to [facility] clinic is on hold until further notice.  91 day remote monitoring will continue per protocol.

## 2024-09-13 ENCOUNTER — Ambulatory Visit

## 2024-10-14 ENCOUNTER — Ambulatory Visit (HOSPITAL_COMMUNITY)

## 2024-12-02 ENCOUNTER — Ambulatory Visit

## 2025-03-03 ENCOUNTER — Ambulatory Visit

## 2025-06-02 ENCOUNTER — Ambulatory Visit

## 2025-09-01 ENCOUNTER — Ambulatory Visit

## 2025-12-01 ENCOUNTER — Ambulatory Visit

## 2026-03-02 ENCOUNTER — Ambulatory Visit

## 2026-06-01 ENCOUNTER — Ambulatory Visit

## 2026-08-31 ENCOUNTER — Ambulatory Visit
